# Patient Record
Sex: Female | Born: 1935 | Race: White | Hispanic: No | Smoking: Former smoker
Health system: Southern US, Community
[De-identification: ages and names within clinical notes are randomized; demographics above are authoritative.]

## PROBLEM LIST (undated history)

## (undated) DIAGNOSIS — M4800 Spinal stenosis, site unspecified: Secondary | ICD-10-CM

## (undated) DIAGNOSIS — E785 Hyperlipidemia, unspecified: Secondary | ICD-10-CM

## (undated) DIAGNOSIS — R300 Dysuria: Secondary | ICD-10-CM

## (undated) DIAGNOSIS — E039 Hypothyroidism, unspecified: Secondary | ICD-10-CM

## (undated) DIAGNOSIS — R0602 Shortness of breath: Secondary | ICD-10-CM

## (undated) DIAGNOSIS — F329 Major depressive disorder, single episode, unspecified: Secondary | ICD-10-CM

## (undated) DIAGNOSIS — Z5189 Encounter for other specified aftercare: Secondary | ICD-10-CM

## (undated) DIAGNOSIS — E538 Deficiency of other specified B group vitamins: Secondary | ICD-10-CM

## (undated) DIAGNOSIS — I219 Acute myocardial infarction, unspecified: Secondary | ICD-10-CM

## (undated) DIAGNOSIS — F411 Generalized anxiety disorder: Secondary | ICD-10-CM

## (undated) DIAGNOSIS — IMO0002 Reserved for concepts with insufficient information to code with codable children: Secondary | ICD-10-CM

## (undated) DIAGNOSIS — I2510 Atherosclerotic heart disease of native coronary artery without angina pectoris: Secondary | ICD-10-CM

## (undated) DIAGNOSIS — IMO0001 Reserved for inherently not codable concepts without codable children: Secondary | ICD-10-CM

## (undated) DIAGNOSIS — R5383 Other fatigue: Secondary | ICD-10-CM

## (undated) DIAGNOSIS — R269 Unspecified abnormalities of gait and mobility: Secondary | ICD-10-CM

## (undated) DIAGNOSIS — H269 Unspecified cataract: Secondary | ICD-10-CM

## (undated) DIAGNOSIS — R350 Frequency of micturition: Secondary | ICD-10-CM

## (undated) DIAGNOSIS — M1990 Unspecified osteoarthritis, unspecified site: Secondary | ICD-10-CM

## (undated) DIAGNOSIS — D649 Anemia, unspecified: Secondary | ICD-10-CM

## (undated) DIAGNOSIS — Z8719 Personal history of other diseases of the digestive system: Secondary | ICD-10-CM

## (undated) DIAGNOSIS — R5381 Other malaise: Secondary | ICD-10-CM

## (undated) DIAGNOSIS — E559 Vitamin D deficiency, unspecified: Secondary | ICD-10-CM

## (undated) DIAGNOSIS — I10 Essential (primary) hypertension: Secondary | ICD-10-CM

## (undated) DIAGNOSIS — N390 Urinary tract infection, site not specified: Secondary | ICD-10-CM

## (undated) DIAGNOSIS — I509 Heart failure, unspecified: Secondary | ICD-10-CM

## (undated) DIAGNOSIS — I639 Cerebral infarction, unspecified: Secondary | ICD-10-CM

## (undated) DIAGNOSIS — F32A Depression, unspecified: Secondary | ICD-10-CM

## (undated) DIAGNOSIS — K219 Gastro-esophageal reflux disease without esophagitis: Secondary | ICD-10-CM

## (undated) HISTORY — PX: EYE SURGERY: SHX253

## (undated) HISTORY — PX: BACK SURGERY: SHX140

## (undated) HISTORY — DX: Other malaise: R53.81

## (undated) HISTORY — DX: Reserved for concepts with insufficient information to code with codable children: IMO0002

## (undated) HISTORY — DX: Unspecified abnormalities of gait and mobility: R26.9

## (undated) HISTORY — PX: SPINE SURGERY: SHX786

## (undated) HISTORY — PX: CATARACT EXTRACTION W/ INTRAOCULAR LENS  IMPLANT, BILATERAL: SHX1307

## (undated) HISTORY — DX: Unspecified cataract: H26.9

## (undated) HISTORY — DX: Vitamin D deficiency, unspecified: E55.9

## (undated) HISTORY — DX: Other fatigue: R53.83

## (undated) HISTORY — PX: BLADDER SUSPENSION: SHX72

## (undated) HISTORY — DX: Dysuria: R30.0

## (undated) HISTORY — DX: Deficiency of other specified B group vitamins: E53.8

## (undated) HISTORY — PX: OTHER SURGICAL HISTORY: SHX169

## (undated) HISTORY — DX: Generalized anxiety disorder: F41.1

## (undated) HISTORY — DX: Encounter for other specified aftercare: Z51.89

## (undated) HISTORY — DX: Hyperlipidemia, unspecified: E78.5

## (undated) HISTORY — PX: APPENDECTOMY: SHX54

## (undated) HISTORY — PX: CARDIAC CATHETERIZATION: SHX172

---

## 1984-08-20 HISTORY — PX: ABDOMINAL HYSTERECTOMY: SHX81

## 1999-06-29 ENCOUNTER — Encounter: Payer: Self-pay | Admitting: Internal Medicine

## 1999-06-29 ENCOUNTER — Ambulatory Visit (HOSPITAL_COMMUNITY): Admission: RE | Admit: 1999-06-29 | Discharge: 1999-06-29 | Payer: Self-pay | Admitting: Internal Medicine

## 2003-06-22 ENCOUNTER — Ambulatory Visit (HOSPITAL_COMMUNITY): Admission: RE | Admit: 2003-06-22 | Discharge: 2003-06-23 | Payer: Self-pay | Admitting: Neurosurgery

## 2003-12-05 ENCOUNTER — Emergency Department (HOSPITAL_COMMUNITY): Admission: EM | Admit: 2003-12-05 | Discharge: 2003-12-05 | Payer: Self-pay | Admitting: Emergency Medicine

## 2004-04-27 ENCOUNTER — Emergency Department (HOSPITAL_COMMUNITY): Admission: EM | Admit: 2004-04-27 | Discharge: 2004-04-27 | Payer: Self-pay | Admitting: Emergency Medicine

## 2004-12-19 ENCOUNTER — Encounter: Admission: RE | Admit: 2004-12-19 | Discharge: 2004-12-19 | Payer: Self-pay | Admitting: Internal Medicine

## 2005-04-12 ENCOUNTER — Ambulatory Visit (HOSPITAL_COMMUNITY): Admission: RE | Admit: 2005-04-12 | Discharge: 2005-04-12 | Payer: Self-pay | Admitting: Interventional Cardiology

## 2005-05-03 ENCOUNTER — Ambulatory Visit (HOSPITAL_COMMUNITY): Admission: RE | Admit: 2005-05-03 | Discharge: 2005-05-04 | Payer: Self-pay | Admitting: Interventional Cardiology

## 2005-05-10 ENCOUNTER — Ambulatory Visit (HOSPITAL_COMMUNITY): Admission: RE | Admit: 2005-05-10 | Discharge: 2005-05-11 | Payer: Self-pay | Admitting: Interventional Cardiology

## 2005-05-24 ENCOUNTER — Encounter (HOSPITAL_COMMUNITY): Admission: RE | Admit: 2005-05-24 | Discharge: 2005-08-22 | Payer: Self-pay | Admitting: Interventional Cardiology

## 2005-08-21 ENCOUNTER — Encounter: Admission: RE | Admit: 2005-08-21 | Discharge: 2005-08-21 | Payer: Self-pay | Admitting: Internal Medicine

## 2005-08-23 ENCOUNTER — Encounter (HOSPITAL_COMMUNITY): Admission: RE | Admit: 2005-08-23 | Discharge: 2005-11-21 | Payer: Self-pay | Admitting: Interventional Cardiology

## 2006-01-29 ENCOUNTER — Encounter: Admission: RE | Admit: 2006-01-29 | Discharge: 2006-01-29 | Payer: Self-pay | Admitting: Obstetrics and Gynecology

## 2006-06-13 ENCOUNTER — Encounter: Admission: RE | Admit: 2006-06-13 | Discharge: 2006-06-13 | Payer: Self-pay | Admitting: Orthopedic Surgery

## 2006-07-04 ENCOUNTER — Encounter: Admission: RE | Admit: 2006-07-04 | Discharge: 2006-07-04 | Payer: Self-pay | Admitting: Orthopedic Surgery

## 2007-04-14 ENCOUNTER — Encounter: Admission: RE | Admit: 2007-04-14 | Discharge: 2007-04-14 | Payer: Self-pay | Admitting: *Deleted

## 2008-06-18 ENCOUNTER — Encounter: Admission: RE | Admit: 2008-06-18 | Discharge: 2008-06-18 | Payer: Self-pay | Admitting: *Deleted

## 2009-08-16 ENCOUNTER — Encounter: Admission: RE | Admit: 2009-08-16 | Discharge: 2009-08-16 | Payer: Self-pay | Admitting: Internal Medicine

## 2009-12-01 ENCOUNTER — Emergency Department (HOSPITAL_COMMUNITY): Admission: EM | Admit: 2009-12-01 | Discharge: 2009-12-01 | Payer: Self-pay | Admitting: Emergency Medicine

## 2010-09-09 ENCOUNTER — Encounter: Payer: Self-pay | Admitting: Orthopedic Surgery

## 2010-09-10 ENCOUNTER — Encounter: Payer: Self-pay | Admitting: Orthopedic Surgery

## 2010-09-14 ENCOUNTER — Other Ambulatory Visit: Payer: Self-pay | Admitting: Internal Medicine

## 2010-09-14 DIAGNOSIS — Z1239 Encounter for other screening for malignant neoplasm of breast: Secondary | ICD-10-CM

## 2010-09-14 DIAGNOSIS — Z1231 Encounter for screening mammogram for malignant neoplasm of breast: Secondary | ICD-10-CM

## 2010-09-27 ENCOUNTER — Ambulatory Visit
Admission: RE | Admit: 2010-09-27 | Discharge: 2010-09-27 | Disposition: A | Discharge status: Home / Self Care | Payer: Medicare Other | Source: Ambulatory Visit | Attending: Internal Medicine | Admitting: Internal Medicine

## 2010-09-27 DIAGNOSIS — Z1231 Encounter for screening mammogram for malignant neoplasm of breast: Secondary | ICD-10-CM

## 2010-11-08 LAB — URINE CULTURE: Colony Count: >=100000

## 2010-11-08 LAB — POCT CARDIAC MARKERS: Myoglobin, poc: 67.9 ng/mL (ref 12–200)

## 2010-11-08 LAB — DIFFERENTIAL
Basophils Absolute: 0 10*3/uL (ref 0.0–0.1)
Eosinophils Absolute: 0.2 10*3/uL (ref 0.0–0.7)
Eosinophils Relative: 2 % (ref 0–5)
Lymphocytes Relative: 17 % (ref 12–46)
Lymphs Abs: 1.2 10*3/uL (ref 0.7–4.0)
Neutrophils Relative %: 73 % (ref 43–77)

## 2010-11-08 LAB — CBC
MCV: 84.7 fL (ref 78.0–100.0)
Platelets: 228 10*3/uL (ref 150–400)
RDW: 14.1 % (ref 11.5–15.5)
WBC: 6.9 10*3/uL (ref 4.0–10.5)

## 2010-11-08 LAB — URINALYSIS, ROUTINE W REFLEX MICROSCOPIC
Bilirubin Urine: NEGATIVE
Ketones, ur: NEGATIVE mg/dL
Protein, ur: NEGATIVE mg/dL
Urobilinogen, UA: 0.2 mg/dL (ref 0.0–1.0)

## 2010-11-08 LAB — BASIC METABOLIC PANEL
BUN: 15 mg/dL (ref 6–23)
Creatinine, Ser: 0.89 mg/dL (ref 0.4–1.2)
GFR calc non Af Amer: >60 mL/min (ref >60)
Glucose, Bld: 142 mg/dL — ABNORMAL HIGH (ref 70–99)

## 2011-01-05 NOTE — Cardiovascular Report (Signed)
Abigail Brooks, DELAND NO.:  [id number]   MEDICAL RECORD NO.:  [id number]          PATIENT TYPE:  OIB   LOCATION:  6525                         FACILITY:  [facility]   PHYSICIAN:  Lyn Records, M.D.   DATE OF BIRTH:  Apr 01, 1935   DATE OF PROCEDURE:  05/03/2005  DATE OF DISCHARGE:                              CARDIAC CATHETERIZATION   INDICATIONS:  The patient is 19 and has a history of hypertension, and had a  recent CT angiogram that suggested the possibility of high-grade disease in  the right coronary and in the circumflex territory. A previous Cardiolite  study showed fixed anterior and inferior wall defects, without evidence of  ischemia. This study is being done to define coronary anatomy.   PROCEDURES PERFORMED:  1.  Left heart catheterization  2.  Selective coronary angiogram.  3.  Left ventriculography: Not performed but LV pressure was measured.   OPERATORS:  1.  Corky Crafts, MD  2.  Elwanda Brooklyn. Katrinka Blazing III M.D.   DESCRIPTION:  After informed consent, a 6-French sheath was placed in the  right femoral artery using the modified Seldinger technique. A 6-French left  Judkins and 6-French right Judkins catheter was then used for left coronary  and right coronary angiography. The right coronary catheter was also used to  measure hemodynamics in the LV and to record a pullback pressure across the  aortic valve. Left ventriculography was not performed, because the  previously evaluated LV function during the Cardiolite study and the EF was  documented to be 64%.   After reviewing the digital cineangiograms, we felt that proceeding with  percutaneous intervention was appropriate. Please see separate report for  that procedure.   DESCRIPTION:   RESULTS:   HEMODYNAMIC DATA:  1.  Left ventricular pressure:  157/89.  2.  Aortic pressure:  152/70.   LEFT VENTRICULOGRAPHY:  Left ventriculography was not performed. EF known to  be greater than 60% by  Cardiolite wall motion study in August.   CORONARY ANGIOGRAPHY:  1.  LEFT MAIN CORONARY: Widely patent.  2.  LEFT ANTERIOR DESCENDING CORONARY ARTERY: The LAD is large and      transapical. It gives origin to three diagonal branches. Proximal, mid      and distal irregularities are noted. Up to 30%- 40% stenosis is noted in      the mid vessel. The diagonals were clear of any significant obstructive      lesions. The distal LAD contained up to 30-50% narrowing.  3.  CIRCUMFLEX ARTERY: The circumflex coronary artery is nondominant. There      is segmental involvement in the mid vessel. The circumflex gives origin      with three obtuse marginal branches and the left atrial recurrent. The      proximal/first obtuse marginal is relatively small. In this region of      the proximal and early mid circumflex there is 50-70% narrowing, and in      the mid to distal circumflex (beyond the origin of the first obtuse      marginal) there is high-grade  obstruction with 70-80% narrowing. This      was also relatively segmental. The second and third obtuse marginals are      large and not involved by this mid vessel stenosis.  4.  RIGHT CORONARY: The right coronary artery is a dominant vessel, giving      the AV-nodal artery and the PDA. This vessel is severely diseased. There      was proximal, mid and distal disease. The mid segment,  however, is      highly diseased, with a segmental region of stenosis beyond the acute      marginal branch  -- with stenosis up to 90-95%. The PDA is relatively      large; also contains 50-70% mid vessel; disease. The proximal right      coronary initially had spasm, that was relieved with intracoronary      nitroglycerin, and there is residual 30-50% stenoses within the proximal      segment of the right coronary.   CONCLUSIONS:  1.  Severe right coronary disease (as described above), with segmental      greater than 90% stenosis in the mid to distal vessel. There  is also      significant disease of mid and distal circumflex. The LAD is large and      widely patent.  2.  Previously demonstrated normal LV function, with EF greater than 64% by      Cardiolite scintigraphy wall motion study.   RECOMMENDATIONS:  We previously discussed with the patient during her office  visit of high-grade disease.  We found that PCI would be performed. We did  feel it was appropriate to proceed with PCI on the right coronary, and  decided to do the circumflex at a later date; therefore staging this  procedure. Please see the separate report for the patient's PCI.      Lyn Records, M.D.  Electronically Signed     HWS/MEDQ  D:  05/03/2005  T:  05/03/2005  Job:  [id number]   cc:   Earmon Phoenix, M.D.

## 2011-01-05 NOTE — Cardiovascular Report (Signed)
NAMESHEYLI, HORWITZ NO.:  [id number]   MEDICAL RECORD NO.:  [id number]          PATIENT TYPE:  OIB   LOCATION:  6522                         FACILITY:  [facility]   PHYSICIAN:  Lyn Records, M.D.   DATE OF BIRTH:  November 07, 1934   DATE OF PROCEDURE:  05/10/2005  DATE OF DISCHARGE:                              CARDIAC CATHETERIZATION   INDICATIONS:  Documented significant coronary artery disease involving the  mid right coronary. Prior Cardiolite study demonstrating high-grade disease.   OPERATORS:  Dr. Catalina Gravel and Dr. Verdis Prime.   PROCEDURE PERFORMED:  Drug-eluting stent implantation in the mid right  coronary using a Cypher drug-eluting stent.   DESCRIPTION:  After informed consent with Dr. Eldridge Dace serving as the  primary operator, a 6-French arterial sheath was placed in the left femoral  artery using modified Seldinger technique. The patient was then given a  loading dose followed by an infusion of Angiomax. The patient was pretreated  with Plavix and Integrilin several days since the initial PCI of the right  coronary.   NXB 6-French 3.5 centimeter catheter was used to obtain guiding shots and  then an [employer] wire was used to cross into the distal circumflex.  Predilatation was performed with a 12 mm long x 2.5 mm diameter Voyager. One  balloon inflation was performed. A Cypher 13 x 2.5 mm stent was then placed  and peak deployment pressure was 12 atmospheres. Post dilatation was then  performed with a 3.0 x 8 PowerSail balloon to 10 atmospheres. The post  dilatation angiographic result was excellent with reduction in the 85%  stenosis to less than 5% with brisk TIMI grade III flow. AngioSeal  arteriotomy closure was performed after sheath injection demonstrated  adequate position. Good hemostasis was achieved.   CONCLUSION:  Successful percutaneous coronary intervention of the  midcircumflex with reduction in stenosis from 85% to less than 5%  with TIMI  grade III flow using a Cypher drug-eluting stent 2.5 x 13 and postdilated to  3.0 mm.   PLAN:  Aspirin, Plavix. Integrilin has been discontinued.      Lyn Records, M.D.  Electronically Signed     HWS/MEDQ  D:  05/10/2005  T:  05/10/2005  Job:  [id number]   cc:   Corky Crafts, MD  Fax: [phone]   Karlene Einstein, M.D.  Fax: [phone]

## 2011-01-05 NOTE — Op Note (Signed)
NAME:  Abigail Brooks, Abigail Brooks                        ACCOUNT NO.:  [id number]   MEDICAL RECORD NO.:  [id number]                   PATIENT TYPE:  OIB   LOCATION:  2899                                 FACILITY:  [facility]   PHYSICIAN:  Kathaleen Maser. Pool, M.D.                 DATE OF BIRTH:  [birth year]   DATE OF PROCEDURE:  06/22/2003  DATE OF DISCHARGE:                                 OPERATIVE REPORT   PREOPERATIVE DIAGNOSES:  Right L5-S1 herniated nucleus pulposus with  radiculopathy.   POSTOPERATIVE DIAGNOSES:  Right L5-S1 herniated nucleus pulposus with  radiculopathy.   PROCEDURE:  Right L5-S1 laminotomy with microdiskectomy.   SURGEON:  Kathaleen Maser. Pool, M.D.   ASSISTANT:  Reinaldo Meeker, M.D.   ANESTHESIA:  General endotracheal.   INDICATIONS FOR PROCEDURE:  The patient is a 76 year old female with history  of back and right lower extremity pain, paresthesia and weakness of the  right-sided S1 radiculopathy which failed conservative management.  Workup  has demonstrated evidence of degenerative disk disease with a rightward L5-  S1 disk herniation, causing compression of the right-sided S1 nerve root.  The patient has failed conservative management and now presents for  laminotomy and microdiskectomy with hopeful improvement in his symptoms.   DESCRIPTION OF PROCEDURE:  The patient was taken to the operating room and  placed on the table in a supine position.  After an adequate level of  anesthesia was achieved, the patient was placed prone onto a Wilson frame,  appropriate padded.  The patient's lumbar region was prepped and draped  sterilely.  We made a skin incision overlying the L5-S1 interspace.  This  was carried down sharply in the midline.  Subperiosteal dissection was  performed in the lamina and facet joints of L5 and S1 on the right.  Intraoperative x-rays were taken and the level was confirmed.  Laminotomy  was performed using high speed drill and Kerrison rongeurs to  remove the  inferior aspect of lamina of L5 and medial aspect of the L5-S1 facet joint,  and the superior remnant of S1. Ligamentum flavum was then elevated and  resected in a piecemeal fashion using Kerrison rongeurs.  The thecal sac was  identified.  The microscope was brought into the field to do microdissection  of the left-sided S1 nerve root.  Underlying disk herniation was coagulated  and cut.  Thecal sac and S1 nerve root were mobilized, distracted toward the  middle.  Disk herniation was readily apparent.  This was then incised with  15 blade in rectangular fashion.  A wide disk space cleanout was then  achieved using pituitary rongeurs, open angled pituitary rongeurs, and  Epstein curets.  All elements of disk herniation were apparently resected.  All loose, obviously degenerative disk material was removed from the  interspace.  At this point a very thorough diskectomy had been achieved.  There was no evidence of  any residual compression of the thecal sac or nerve  roots.  The wound was then irrigated with antibiotic solution.  Gelfoam was  placed topically for hemostasis which was found to be good.  Microscope and  distractors  were removed.  Hemostasis was achieved with electrocautery.  Wound was then  closed in layers with Vicryl sutures.  Steri-Strips and sterile dressing  were applied.  There were no apparent complications.  The patient tolerated  the procedure well and she returns to recovery room postoperatively.                                               Henry A. Pool, M.D.    HAP/MEDQ  D:  06/22/2003  T:  06/22/2003  Job:  [id number]

## 2011-01-05 NOTE — Discharge Summary (Signed)
Abigail Brooks, Abigail Brooks              ACCOUNT NO.:  [id number]   MEDICAL RECORD NO.:  [id number]          PATIENT TYPE:  OIB   LOCATION:  6525                         FACILITY:  [facility]   PHYSICIAN:  Lyn Records, M.D.   DATE OF BIRTH:  1935-04-07   DATE OF ADMISSION:  05/03/2005  DATE OF DISCHARGE:  05/04/2005                                 DISCHARGE SUMMARY   ADMISSION DIAGNOSIS:  Suspected coronary artery disease.   DISCHARGE DIAGNOSES:  1.  Severe two-vessel coronary artery disease with greater than 90% stenosis      in the right coronary and 80% circumflex disease.  2.  Successful implantation of drug-eluting stent in the right coronary on      __________ 14, 2006.  3.  Hypertension.  4.  Hyperlipidemia.   PROCEDURE PERFORMED:  Diagnostic cardiac catheterization and percutaneous  coronary intervention with right coronary stenting.   DISCHARGE INSTRUCTIONS:  The patient is discharged on:  1.  Aspirin 325 mg daily.  2.  Plavix 75 mg daily.  3.  Lisinopril 20 mg daily.  4.  Hydrochlorothiazide 25 mg per day.  5.  Nabumetone 500 mg twice a day.  6.  Ditropan 10 mg per day.  7.  Metoprolol 50 mg twice a day.  8.  Sublingual nitroglycerin p.r.n.   She will be readmitted on May 10, 2005, for an elective PCI on the  circumflex.  This procedure is scheduled for 9:30 a.m.   ACTIVITY:  The patient is free to ambulate as tolerated.   SPECIAL INSTRUCTIONS:  She should use sublingual nitroglycerin if any  tightness or chest discomfort.   CONDITION ON DISCHARGE:  Improved.   HOSPITAL COURSE:  This patient had a CT angiogram performed earlier this  month that has suggested high-grade right coronary and circumflex disease.  Diagnostic catheterization today demonstrated high-grade distal right  coronary and mid-right coronary disease as well as high-grade mid- and  distal circumflex disease.  PCI was performed on the right coronary lesion  in the mid- to distal segment with  reduction of a greater than 90% stenosis  to  less than or equal to 0% with TIMI grade 3 flow.  She will return on  May 10, 2005, for staged PCI of the circumflex disease.   New medications added include Plavix 75 mg daily.  The patient is discharged  in improved condition.      Lyn Records, M.D.  Electronically Signed     HWS/MEDQ  D:  05/03/2005  T:  05/04/2005  Job:  [id number]

## 2011-01-05 NOTE — Discharge Summary (Signed)
[name], Brooks              ACCOUNT NO.:  [id number]   MEDICAL RECORD NO.:  [id number]          PATIENT TYPE:  OIB   LOCATION:  6522                         FACILITY:  [facility]   PHYSICIAN:  Lyn Records, M.D.   DATE OF BIRTH:  [birth year]   DATE OF ADMISSION:  05/10/2005  DATE OF DISCHARGE:  05/11/2005                                 DISCHARGE SUMMARY   ADMISSION DIAGNOSIS:  High-grade circumflex mid vessel disease.   DISCHARGE DIAGNOSIS:  1.  Status post drug-eluting stent to the mid circumflex with excellent      angiographic result and stenosis reduced to less than 10%.  2.  Hypotension.  3.  Fatigue secondary to beta blocker therapy.  4.  Hyperlipidemia, on therapy.   PROCEDURES PERFORMED:  Drug-eluting stent implantation to the mid circumflex  coronary artery with excellent result on May 10, 2005.   DISCHARGE PLAN:  The patient is discharged home on May 11, 2205.   MEDICATIONS AT DISCHARGE:  1.  Plavix 75 mg daily.  2.  Enteric-coated aspirin 325 mg daily.  3.  Metoprolol 50 mg tablets. The dose was decreased one-half tablet b.i.d.      (25 mg b.i.d.).  4.  Lisinopril 40 mg daily.  5.  Ditropan XL 10 mg daily.  6.  HCTZ 25 mg one-half tablet daily.  7.  Mobic 15 mg daily as needed.  8.  Diazepam 10 mg daily as needed.  9.  Nitroglycerin 0.4 mg sublingual p.r.n.   ACTIVITY:  Increased as tolerated. No driving for five to seven days.  Anticipate full level of activity by one week following discharge.   DISCHARGE INSTRUCTIONS:  1.  Office visit with Dr. Katrinka Blazing on June 18, 2005, at 2:45 p.m.  2.  Instructed to call if any prolonged episodes of chest discomfort or the      need for sublingual nitroglycerin.   CONDITION ON DISCHARGE:  Improved.   HISTORY AND PHYSICAL/HOSPITAL COURSE:  Please refer to the records already  on the chart for the patient's history. Briefly, she had a progressive story  of chest discomfort. A cardiac CT demonstrated  high-grade disease in the  circumflex and right coronary. She underwent right coronary stent  implantation at the same time as diagnostic cardiac catheterization on  May 03, 2005, and returned on May 10, 2005, for elective left  circumflex stent implantation. Both were drug-eluting stents, CYPHER.  No  complications occurred and the patient is discharged to the hospital. No  complications occurred and the patient is discharged from the hospital in  improved condition.      Lyn Records, M.D.  Electronically Signed     HWS/MEDQ  D:  05/11/2005  T:  05/11/2005  Job:  [id number]

## 2011-01-05 NOTE — Op Note (Signed)
[name], Abigail Brooks NO.:  [id number]   MEDICAL RECORD NO.:  [id number]          PATIENT TYPE:  OIB   LOCATION:  6525                         FACILITY:  [facility]   PHYSICIAN:  Lyn Records, M.D.   DATE OF BIRTH:  26-May-1935   DATE OF PROCEDURE:  05/03/2005  DATE OF DISCHARGE:                                 OPERATIVE REPORT   PROCEDURE:  Percutaneous coronary intervention.   INDICATION:  Angina and documented high-grade disease in the mid-to-distal  segment of the right coronary.   OPERATORS:  Corky Crafts, M.D. and Lyn Records, M.D.   PROCEDURE PERFORMED:  Drug-eluting stent mid/distal right coronary.   DESCRIPTION:  Following the diagnostic procedure done on the same day, we  proceeded to perform intervention on the mid-right coronary. The primary  operator was Corky Crafts, M.D. The patient was given weight-based  heparin in a double bolus followed by an infusion of Integrilin.  A JR-4  side-hole guide catheter was used to obtain guiding shots. After the ACT was  documented to be greater than 200 seconds and ultimately noted to be above  300, PCI was performed using a [employer] wire. This wire was distally placed in  the right coronary, and predilatation was performed with a 20-mm long x 2.5  mm Voyager balloon. A Cypher 2.5-mm x 23-mm long stent was then deployed.  Multiple balloon inflations were performed.  Pulse dilatation was then  performed using a 3.0 x 15 PowerSail. Peak pressure with this balloon was 20  atmospheres. A very nice angiographic result was noted post procedure. TIMI  grade 3 flow was maintained. The patient did have angina with balloon  inflations, and there were ST-segment changes. Postprocedure, the patient  was pain free and had no ischemic EKG changes noted. A closure device was  not used.   CONCLUSION:  Successful drug-eluting stent deployment in the mid-to-distal  right coronary with reduction in stenosis from greater  than 90% to 0% with  TIMI grade 3 flow using a Cypher drug-eluting stent as described above.   PLAN:  Integrilin x18 hours, discharge May 04, 2005.  The patient will  receive Plavix for at least six months and possibly a year, and we plan on  performing PCI on the patient's circumflex in the next 1-2 weeks.      Lyn Records, M.D.  Electronically Signed     HWS/MEDQ  D:  05/03/2005  T:  05/03/2005  Job:  [id number]   cc:   Karlene Einstein, M.D.  [address]  [place]  [place] [address]  Fax: [phone]

## 2011-02-26 ENCOUNTER — Emergency Department (HOSPITAL_COMMUNITY): Payer: Medicare Other

## 2011-02-26 ENCOUNTER — Inpatient Hospital Stay (HOSPITAL_COMMUNITY)
Admission: EM | Admit: 2011-02-26 | Discharge: 2011-03-01 | DRG: 293 | Disposition: A | Discharge status: Home / Self Care | Payer: Medicare Other | Attending: Interventional Cardiology | Admitting: Interventional Cardiology

## 2011-02-26 DIAGNOSIS — F411 Generalized anxiety disorder: Secondary | ICD-10-CM | POA: Diagnosis present

## 2011-02-26 DIAGNOSIS — I5033 Acute on chronic diastolic (congestive) heart failure: Principal | ICD-10-CM | POA: Diagnosis present

## 2011-02-26 DIAGNOSIS — D649 Anemia, unspecified: Secondary | ICD-10-CM | POA: Diagnosis present

## 2011-02-26 DIAGNOSIS — I509 Heart failure, unspecified: Secondary | ICD-10-CM | POA: Diagnosis present

## 2011-02-26 DIAGNOSIS — B029 Zoster without complications: Secondary | ICD-10-CM | POA: Diagnosis present

## 2011-02-26 DIAGNOSIS — I2510 Atherosclerotic heart disease of native coronary artery without angina pectoris: Secondary | ICD-10-CM | POA: Diagnosis present

## 2011-02-26 DIAGNOSIS — Z7982 Long term (current) use of aspirin: Secondary | ICD-10-CM

## 2011-02-26 DIAGNOSIS — I10 Essential (primary) hypertension: Secondary | ICD-10-CM | POA: Diagnosis present

## 2011-02-26 LAB — COMPREHENSIVE METABOLIC PANEL
Albumin: 3.1 g/dL — ABNORMAL LOW (ref 3.5–5.2)
Alkaline Phosphatase: 58 U/L (ref 39–117)
BUN: 10 mg/dL (ref 6–23)
Calcium: 9 mg/dL (ref 8.4–10.5)
Potassium: 3.5 mEq/L (ref 3.5–5.1)
Sodium: 141 mEq/L (ref 135–145)
Total Protein: 6.5 g/dL (ref 6.0–8.3)

## 2011-02-26 LAB — PROTIME-INR: Prothrombin Time: 12.8 seconds (ref 11.6–15.2)

## 2011-02-26 LAB — CARDIAC PANEL(CRET KIN+CKTOT+MB+TROPI)
CK, MB: 1.4 ng/mL (ref 0.3–4.0)
Total CK: 32 U/L (ref 7–177)
Troponin I: <0.3 ng/mL (ref <0.30)

## 2011-02-26 LAB — DIFFERENTIAL
Lymphocytes Relative: 19 % (ref 12–46)
Lymphs Abs: 1.1 10*3/uL (ref 0.7–4.0)
Neutrophils Relative %: 69 % (ref 43–77)

## 2011-02-26 LAB — POCT I-STAT, CHEM 8
BUN: 12 mg/dL (ref 6–23)
Calcium, Ion: 1.12 mmol/L (ref 1.12–1.32)
Chloride: 102 mEq/L (ref 96–112)
Glucose, Bld: 147 mg/dL — ABNORMAL HIGH (ref 70–99)

## 2011-02-26 LAB — CBC
HCT: 31.5 % — ABNORMAL LOW (ref 36.0–46.0)
Hemoglobin: 10.1 g/dL — ABNORMAL LOW (ref 12.0–15.0)
MCV: 79.9 fL (ref 78.0–100.0)
RBC: 3.94 MIL/uL (ref 3.87–5.11)
WBC: 5.8 10*3/uL (ref 4.0–10.5)

## 2011-02-26 LAB — TROPONIN I: Troponin I: <0.3 ng/mL (ref <0.30)

## 2011-02-26 LAB — PRO B NATRIURETIC PEPTIDE: Pro B Natriuretic peptide (BNP): 1186 pg/mL — ABNORMAL HIGH (ref 0–125)

## 2011-02-26 LAB — HEMOGLOBIN A1C: Mean Plasma Glucose: 157 mg/dL — ABNORMAL HIGH (ref <117)

## 2011-02-26 LAB — GLUCOSE, CAPILLARY: Glucose-Capillary: 170 mg/dL — ABNORMAL HIGH (ref 70–99)

## 2011-02-27 ENCOUNTER — Inpatient Hospital Stay (HOSPITAL_COMMUNITY): Payer: Medicare Other

## 2011-02-27 LAB — D-DIMER, QUANTITATIVE: D-Dimer, Quant: 0.98 ug/mL-FEU — ABNORMAL HIGH (ref 0.00–0.48)

## 2011-02-27 LAB — BASIC METABOLIC PANEL
BUN: 14 mg/dL (ref 6–23)
Calcium: 8.8 mg/dL (ref 8.4–10.5)
Creatinine, Ser: 0.88 mg/dL (ref 0.50–1.10)
GFR calc Af Amer: >60 mL/min (ref >60)
GFR calc non Af Amer: >60 mL/min (ref >60)
Glucose, Bld: 159 mg/dL — ABNORMAL HIGH (ref 70–99)
Potassium: 3.8 mEq/L (ref 3.5–5.1)

## 2011-02-27 LAB — GLUCOSE, CAPILLARY
Glucose-Capillary: 135 mg/dL — ABNORMAL HIGH (ref 70–99)
Glucose-Capillary: 155 mg/dL — ABNORMAL HIGH (ref 70–99)

## 2011-02-27 LAB — CARDIAC PANEL(CRET KIN+CKTOT+MB+TROPI)
CK, MB: 1.5 ng/mL (ref 0.3–4.0)
Total CK: 27 U/L (ref 7–177)
Troponin I: <0.3 ng/mL (ref <0.30)

## 2011-02-28 ENCOUNTER — Encounter (HOSPITAL_COMMUNITY): Payer: Medicare Other | Attending: Cardiology

## 2011-02-28 ENCOUNTER — Inpatient Hospital Stay (HOSPITAL_COMMUNITY): Payer: Medicare Other

## 2011-02-28 LAB — GLUCOSE, CAPILLARY: Glucose-Capillary: 126 mg/dL — ABNORMAL HIGH (ref 70–99)

## 2011-02-28 LAB — BASIC METABOLIC PANEL
BUN: 18 mg/dL (ref 6–23)
Creatinine, Ser: 0.78 mg/dL (ref 0.50–1.10)
GFR calc Af Amer: >60 mL/min (ref >60)
GFR calc non Af Amer: >60 mL/min (ref >60)
Glucose, Bld: 153 mg/dL — ABNORMAL HIGH (ref 70–99)

## 2011-02-28 MED ORDER — IOHEXOL 300 MG/ML  SOLN
100.0000 mL | Freq: Once | INTRAMUSCULAR | Status: AC | PRN
  Administered 2011-02-28: 100 mL via INTRAVENOUS

## 2011-02-28 MED ORDER — TECHNETIUM TC 99M TETROFOSMIN IV KIT
30.0000 | PACK | Freq: Once | INTRAVENOUS | Status: AC | PRN
  Administered 2011-02-28: 30 via INTRAVENOUS

## 2011-02-28 MED ORDER — TECHNETIUM TC 99M TETROFOSMIN IV KIT
10.0000 | PACK | Freq: Once | INTRAVENOUS | Status: AC | PRN
  Administered 2011-02-27: 30 via INTRAVENOUS

## 2011-03-01 LAB — CBC
HCT: 33.2 % — ABNORMAL LOW (ref 36.0–46.0)
MCV: 80.4 fL (ref 78.0–100.0)
Platelets: 207 10*3/uL (ref 150–400)
RBC: 4.13 MIL/uL (ref 3.87–5.11)
RDW: 14.9 % (ref 11.5–15.5)
WBC: 7.5 10*3/uL (ref 4.0–10.5)

## 2011-03-15 NOTE — Discharge Summary (Signed)
NAMEDEBORAHANN, Abigail Brooks NO.:  [id number]  MEDICAL RECORD NO.:  [id number]  LOCATION:  3302                         FACILITY:  [facility]  PHYSICIAN:  Lyn Records, M.D.   DATE OF BIRTH:  [birth year]  DATE OF ADMISSION:  02/26/2011 DATE OF DISCHARGE:  03/01/2011                              DISCHARGE SUMMARY   REASON FOR ADMISSION:  Dyspnea.  DISCHARGE DIAGNOSES: 1. Acute on chronic diastolic heart failure, resolved with diuresis.     a.     Left ventricular hypertrophy by echocardiogram with ejection      fraction of 65%.     b.     Elevated BNP of 1100, decreased to 300 at discharge. 2. Coronary atherosclerotic heart disease.     a.     Prior right coronary and circumflex stents in 2004.     b.     Negative Cardiolite for ischemia on February 28, 2011. 3. Shingles, 66 weeks old and no prior therapy before this admission. 4. Hypertension. 5. Anxiety. 6. Anemia.  PROCEDURES PERFORMED DURING HOSPITAL STAY: 1. Two-day pharmacologic nuclear perfusion study, February 28, 2011,     negative for ischemia. 2. A 2-D Doppler echocardiogram, February 28, 2011, left ventricular     hypertrophy, ejection fraction 65%. 3. CT angio of the lungs due to elevated D-dimer on February 28, 2011,     negative for pulmonary embolism.  DISCHARGE PLANS: 1. Low-salt diet. 2. The patient is to see her primary care physician, Dr. Allena Katz at     [facility] as soon as possible with reference to     shingles, which I now believed has run its course and is between 74     and 41 weeks old. 3. Medication regimen:  Losartan and hydrochlorothiazide 50/12.5 mg     daily. 4. Artificial tears in both eyes daily. 5. Coated aspirin 325 mg daily. 6. Citalopram 20 mg daily. 7. Clonazepam 0.5 mg twice daily. 8. Dulcolax stool softener 1-2 tablets twice daily. 9. Fish oil 1000 mg three times a day. 10.Metformin 500 mg twice daily. 11.Metoprolol tartrate 100 mg twice daily. 12.Oxybutynin 5 mg as needed  every 6 hours for pain. 13.Simvastatin 40 mg daily. 14.Vitamin D3 U and K two tablets each day. 15.Discontinue ibuprofen.  CLINICAL FOLLOWUP:  With Dr. Barbra Sarks on March 07, 2011, at 10:15 a.m. also a BMET will be done at that time to assess potassium and renal function.  The patient is to call if any recurrence of dyspnea.  HISTORY AND PHYSICAL AND HOSPITAL COURSE:  Please review the patient's admitting history and physical.  The patient was admitted to the hospital because of dyspnea and that was a concern on her part that this represented recurrence of coronary obstruction.  Cardiac markers were negative.  BNP was elevated. Myocardial perfusion study demonstrated no evidence of ischemia.  CT angio of the lungs did not demonstrate evidence of pulmonary emboli.  IV diuresis of greater than 2 L led to significant improvement in dyspnea. She was ultimately discharged from the hospital on a high dose of losartan and hydrochlorothiazide was added.  She will follow up as an outpatient in 1  week for blood work and clinical evaluation with reference to perhaps further titration of diuretic therapy if needed.  Coincidentally after admission, we found that the patient had left lateral chest shingles with almost every lesion appearing to be old and scabbed over, although because of this, we did place her on aerosol isolation throughout the entire hospital stay.  She is urged to follow up with her primary physician within 24 hours for further management of this condition.  At discharge, the patient's creatinine is 0.78, potassium 3.7, hemoglobin 10.5.  The patient's discharge weight is 225.2 pounds with her clothes on.  Dictation ended at this point.     Lyn Records, M.D.     HWS/MEDQ  D:  03/01/2011  T:  03/01/2011  Job:  [id number]  cc:   Dr. Allena Katz  Electronically Signed by Verdis Prime M.D. on 03/15/2011 11:27:43 AM

## 2011-03-15 NOTE — H&P (Signed)
  Abigail Brooks, GOWANS NO.:  [id number]  MEDICAL RECORD NO.:  [id number]  LOCATION:  3302                         FACILITY:  [facility]  PHYSICIAN:  Lyn Records, M.D.   DATE OF BIRTH:  Dec 23, 1934  DATE OF ADMISSION:  02/26/2011 DATE OF DISCHARGE:                             HISTORY & PHYSICAL   REASON FOR ADMISSION:  Dyspnea, orthopnea, and chest fullness.  SUBJECTIVE:  The patient is 76 years of age and has experienced increasing dyspnea on exertion and chest fullness over the past several months.  She was seen in our office on October 03, 2010 for dyspnea. Cardiac evaluation including a BNP level were normal at that time.  She has gotten to the point now where it is difficult for her to lay down. Any activity causes dyspnea.  She has not had wheezing and denies chills and fever.  She has noticed a rash in her left upper chest for 2-3 weeks.  It is painful.  PAST MEDICAL HISTORY: 1. Coronary atherosclerotic heart disease with drug-eluting stent     implantation in the circumflex and right coronary in 2006. 2. Hyperlipidemia. 3. Hypertension. 4. Probable shingles for 3 weeks. 5. Anxiety disorder.  SURGERIES:  None.  HABITS:  Does not smoke or drink.  ALLERGIES:  CODEINE.  REVIEW OF SYSTEMS:  Anxiety disorder.  OBJECTIVE:  VITAL SIGNS:  Blood pressure 160/60, heart rate 70. NECK:  Difficult to evaluate.  No obvious JVD. SKIN:  Reveals left axillary and scapular area of vesicular eruption with scabbing. HEENT:  Unremarkable.  No eye lesions are noted. CHEST:  Reveals bibasilar crackles. CARDIAC:  Reveals no S4, no murmur. ABDOMEN:  Obese.  Nontender. EXTREMITIES:  Reveal left lower extremity edema of 1 to 2+. NEUROLOGIC:  Reveals decreased memory.  EKG reveals normal sinus rhythm with left axis deviation.  Laboratory data revealed hemoglobin of 10.5, sed rate of 42.  White blood cell count of 5800, creatinine of 0.65, potassium 3.5, BNP 1186, A1c  7.1. Chest x-ray revealed cardiomegaly without CHF.  ASSESSMENT: 1. Dyspnea and chest tightness.  Suspect diastolic heart failure.     Rule out coronary artery disease. 2. Probable shingles, left chest greater than 74 weeks old and less     than 78 weeks old with most lesions appearing scabbed over. 3. Hypertension with poorly controlled systolic pressure.  PLAN: 1. Diuresis. 2. Nuclear study to rule out ischemia. 3. Check D-dimer. 4. Isolation due to shingles. 5. Better blood pressure control.     Lyn Records, M.D.     HWS/MEDQ  D:  02/28/2011  T:  02/28/2011  Job:  [id number]  Electronically Signed by Verdis Prime M.D. on 03/15/2011 11:27:48 AM

## 2011-09-11 ENCOUNTER — Other Ambulatory Visit: Payer: Self-pay | Admitting: Internal Medicine

## 2011-09-11 DIAGNOSIS — Z79899 Other long term (current) drug therapy: Secondary | ICD-10-CM | POA: Diagnosis not present

## 2011-09-11 DIAGNOSIS — Z1231 Encounter for screening mammogram for malignant neoplasm of breast: Secondary | ICD-10-CM

## 2011-09-11 DIAGNOSIS — E785 Hyperlipidemia, unspecified: Secondary | ICD-10-CM | POA: Diagnosis not present

## 2011-09-11 DIAGNOSIS — M545 Low back pain: Secondary | ICD-10-CM

## 2011-09-11 DIAGNOSIS — E119 Type 2 diabetes mellitus without complications: Secondary | ICD-10-CM | POA: Diagnosis not present

## 2011-09-11 DIAGNOSIS — I10 Essential (primary) hypertension: Secondary | ICD-10-CM | POA: Diagnosis not present

## 2011-09-11 DIAGNOSIS — Z780 Asymptomatic menopausal state: Secondary | ICD-10-CM

## 2011-09-15 ENCOUNTER — Ambulatory Visit
Admission: RE | Admit: 2011-09-15 | Discharge: 2011-09-15 | Disposition: A | Discharge status: Home / Self Care | Payer: Medicare Other | Source: Ambulatory Visit | Attending: Internal Medicine | Admitting: Internal Medicine

## 2011-09-15 DIAGNOSIS — M7130 Other bursal cyst, unspecified site: Secondary | ICD-10-CM | POA: Diagnosis not present

## 2011-09-15 DIAGNOSIS — M545 Low back pain: Secondary | ICD-10-CM

## 2011-09-15 DIAGNOSIS — M47817 Spondylosis without myelopathy or radiculopathy, lumbosacral region: Secondary | ICD-10-CM | POA: Diagnosis not present

## 2011-09-15 DIAGNOSIS — M5137 Other intervertebral disc degeneration, lumbosacral region: Secondary | ICD-10-CM | POA: Diagnosis not present

## 2011-09-26 DIAGNOSIS — M48061 Spinal stenosis, lumbar region without neurogenic claudication: Secondary | ICD-10-CM | POA: Diagnosis not present

## 2011-10-01 DIAGNOSIS — M48061 Spinal stenosis, lumbar region without neurogenic claudication: Secondary | ICD-10-CM | POA: Diagnosis not present

## 2011-10-09 DIAGNOSIS — M48061 Spinal stenosis, lumbar region without neurogenic claudication: Secondary | ICD-10-CM | POA: Diagnosis not present

## 2011-10-09 DIAGNOSIS — IMO0002 Reserved for concepts with insufficient information to code with codable children: Secondary | ICD-10-CM | POA: Diagnosis not present

## 2011-10-09 DIAGNOSIS — M47817 Spondylosis without myelopathy or radiculopathy, lumbosacral region: Secondary | ICD-10-CM | POA: Diagnosis not present

## 2011-10-10 ENCOUNTER — Other Ambulatory Visit: Payer: Medicare Other

## 2011-10-10 ENCOUNTER — Ambulatory Visit: Payer: Medicare Other

## 2011-10-25 DIAGNOSIS — IMO0002 Reserved for concepts with insufficient information to code with codable children: Secondary | ICD-10-CM | POA: Diagnosis not present

## 2011-10-25 DIAGNOSIS — M47817 Spondylosis without myelopathy or radiculopathy, lumbosacral region: Secondary | ICD-10-CM | POA: Diagnosis not present

## 2011-10-25 DIAGNOSIS — M48061 Spinal stenosis, lumbar region without neurogenic claudication: Secondary | ICD-10-CM | POA: Diagnosis not present

## 2011-11-15 DIAGNOSIS — IMO0002 Reserved for concepts with insufficient information to code with codable children: Secondary | ICD-10-CM | POA: Diagnosis not present

## 2011-11-15 DIAGNOSIS — M76899 Other specified enthesopathies of unspecified lower limb, excluding foot: Secondary | ICD-10-CM | POA: Diagnosis not present

## 2011-12-04 DIAGNOSIS — M48061 Spinal stenosis, lumbar region without neurogenic claudication: Secondary | ICD-10-CM | POA: Diagnosis not present

## 2011-12-06 ENCOUNTER — Encounter (HOSPITAL_COMMUNITY): Payer: Self-pay | Admitting: Pharmacy Technician

## 2011-12-11 DIAGNOSIS — E559 Vitamin D deficiency, unspecified: Secondary | ICD-10-CM | POA: Diagnosis not present

## 2011-12-11 DIAGNOSIS — Z23 Encounter for immunization: Secondary | ICD-10-CM | POA: Diagnosis not present

## 2011-12-11 DIAGNOSIS — N390 Urinary tract infection, site not specified: Secondary | ICD-10-CM | POA: Diagnosis not present

## 2011-12-11 DIAGNOSIS — E119 Type 2 diabetes mellitus without complications: Secondary | ICD-10-CM | POA: Diagnosis not present

## 2011-12-11 DIAGNOSIS — E785 Hyperlipidemia, unspecified: Secondary | ICD-10-CM | POA: Diagnosis not present

## 2011-12-11 DIAGNOSIS — E039 Hypothyroidism, unspecified: Secondary | ICD-10-CM | POA: Diagnosis not present

## 2011-12-11 DIAGNOSIS — D649 Anemia, unspecified: Secondary | ICD-10-CM | POA: Diagnosis not present

## 2011-12-13 ENCOUNTER — Other Ambulatory Visit: Payer: Self-pay | Admitting: Neurosurgery

## 2011-12-17 ENCOUNTER — Encounter (HOSPITAL_COMMUNITY)
Admission: RE | Admit: 2011-12-17 | Discharge: 2011-12-17 | Disposition: A | Discharge status: Home / Self Care | Payer: Medicare Other | Source: Ambulatory Visit | Attending: Neurosurgery | Admitting: Neurosurgery

## 2011-12-17 ENCOUNTER — Ambulatory Visit (HOSPITAL_COMMUNITY)
Admission: RE | Admit: 2011-12-17 | Discharge: 2011-12-17 | Disposition: A | Discharge status: Home / Self Care | Payer: Medicare Other | Source: Ambulatory Visit | Attending: Anesthesiology | Admitting: Anesthesiology

## 2011-12-17 ENCOUNTER — Encounter (HOSPITAL_COMMUNITY): Payer: Self-pay

## 2011-12-17 DIAGNOSIS — Z01818 Encounter for other preprocedural examination: Secondary | ICD-10-CM | POA: Insufficient documentation

## 2011-12-17 DIAGNOSIS — Z01812 Encounter for preprocedural laboratory examination: Secondary | ICD-10-CM | POA: Diagnosis not present

## 2011-12-17 DIAGNOSIS — I517 Cardiomegaly: Secondary | ICD-10-CM | POA: Insufficient documentation

## 2011-12-17 DIAGNOSIS — Z01811 Encounter for preprocedural respiratory examination: Secondary | ICD-10-CM | POA: Diagnosis not present

## 2011-12-17 HISTORY — DX: Encounter for other specified aftercare: Z51.89

## 2011-12-17 HISTORY — DX: Major depressive disorder, single episode, unspecified: F32.9

## 2011-12-17 HISTORY — DX: Unspecified osteoarthritis, unspecified site: M19.90

## 2011-12-17 HISTORY — DX: Anemia, unspecified: D64.9

## 2011-12-17 HISTORY — DX: Depression, unspecified: F32.A

## 2011-12-17 HISTORY — DX: Personal history of other diseases of the digestive system: Z87.19

## 2011-12-17 HISTORY — DX: Heart failure, unspecified: I50.9

## 2011-12-17 HISTORY — DX: Frequency of micturition: R35.0

## 2011-12-17 HISTORY — DX: Atherosclerotic heart disease of native coronary artery without angina pectoris: I25.10

## 2011-12-17 HISTORY — DX: Gastro-esophageal reflux disease without esophagitis: K21.9

## 2011-12-17 HISTORY — DX: Spinal stenosis, site unspecified: M48.00

## 2011-12-17 HISTORY — DX: Acute myocardial infarction, unspecified: I21.9

## 2011-12-17 HISTORY — DX: Hypothyroidism, unspecified: E03.9

## 2011-12-17 HISTORY — DX: Reserved for inherently not codable concepts without codable children: IMO0001

## 2011-12-17 HISTORY — DX: Urinary tract infection, site not specified: N39.0

## 2011-12-17 HISTORY — DX: Shortness of breath: R06.02

## 2011-12-17 HISTORY — DX: Essential (primary) hypertension: I10

## 2011-12-17 LAB — CBC
HCT: 39.7 % (ref 36.0–46.0)
MCHC: 34.8 g/dL (ref 30.0–36.0)
Platelets: 162 10*3/uL (ref 150–400)
RDW: 13 % (ref 11.5–15.5)

## 2011-12-17 LAB — SURGICAL PCR SCREEN: MRSA, PCR: NEGATIVE

## 2011-12-17 LAB — DIFFERENTIAL
Basophils Absolute: 0 10*3/uL (ref 0.0–0.1)
Basophils Relative: 0 % (ref 0–1)
Eosinophils Relative: 5 % (ref 0–5)
Monocytes Absolute: 0.5 10*3/uL (ref 0.1–1.0)
Neutro Abs: 4.8 10*3/uL (ref 1.7–7.7)

## 2011-12-17 LAB — TYPE AND SCREEN: ABO/RH(D): B POS

## 2011-12-17 NOTE — Progress Notes (Signed)
Requested Revonda Standard to review cardiac notes, Dr. Katrinka Blazing, echo 02/28/11.

## 2011-12-17 NOTE — Pre-Procedure Instructions (Signed)
20 Abigail Brooks  12/17/2011   Your procedure is scheduled on:  Friday Dec 21, 2011  Report to [facility] at 0730 AM.  Call this number if you have problems the morning of surgery: [phone]   Remember:   Do not eat food:After Midnight.  May have clear liquids: up to 4 Hours before arrival. (up to 3:30am)  Clear liquids include soda, tea, black coffee, apple or grape juice, broth.  Take these medicines the morning of surgery with A SIP OF WATER: percocet, oxybutynin   Do not wear jewelry, make-up or nail polish.  Do not wear lotions, powders, or perfumes. You may wear deodorant.  Do not shave 48 hours prior to surgery.  Do not bring valuables to the hospital.  Contacts, dentures or bridgework may not be worn into surgery.  Leave suitcase in the car. After surgery it may be brought to your room.  For patients admitted to the hospital, checkout time is 11:00 AM the day of discharge.   Patients discharged the day of surgery will not be allowed to drive home.  Name and phone number of your driver: Arwen Haseley [phone]  Special Instructions: CHG Shower Use Special Wash: 1/2 bottle night before surgery and 1/2 bottle morning of surgery.   Please read over the following fact sheets that you were given: Pain Booklet, Coughing and Deep Breathing, MRSA Information and Surgical Site Infection Prevention

## 2011-12-18 NOTE — Consult Note (Signed)
Anesthesia Chart Review:  Patient is a 77 year old female scheduled for bilateral L4-5 decompression laminectomy, right L 4-5 micro diskectomy on 12/21/11.  History includes former smoker, CAD/MI s/p DES RCA and mid-CX 04/2005, CHF, SOB, DM2, anemia, hypothyroidism, HH, depression, HTN, GERD, arthritis, spinal stenosis.  BMI is ~ 29.  EKG on 12/17/11 showed SB @ 56, LAD, LAFB, anterolateral infarct (age undetermined).  R wave progression is worse since 02/26/11, but probably not significantly changed from her EKG on 12/01/09 or from her [facility] EKGs from 2006 and 06/22/09.    Her Cardiologist is Dr. Katrinka Blazing. He last saw her for annual follow-up on 06/24/11.  By his notes, her diastolic HF was stable off diuretic therapy, and she was symptomatic from a CAD standpoint.  One year follow-up was recommended. Prior to that she was hospitalized in July 2012 for acute on chronic diastolic heart failure.  Cardiac enzymes were negative for acute MI.    Nuclear stress test on 02/28/11 showed: Normal study demonstrating no evidence of myocardial ischemia. No significant scar is identified. Normal left ventricular function with quantitative ejection fraction of 77%.  Echo on 02/28/11 showed moderate LVH, vigorous LV systolic function, EF 65-70%, small pericardial effusion, trivial MR/PR.  She had a cardiac cath on 05/03/05 that showed: 1. Severe right coronary disease, with segmental greater than 90% stenosis in the mid to distal vessel. There is also significant disease of mid and distal circumflex. The LAD is large and widely patent.  2. Previously demonstrated normal LV function, with EF greater than 64% by Cardiolite scintigraphy wall motion study. She subsequently underwent  DES to her RCA (05/03/05) and mid-CX (05/10/05).  CXR from 12/17/11 showed stable cardiomegaly since at least July 2012. No active lung disease.   Labs noted.  For some reason, BMET was not done at her PAT appointment, so it is scheduled to  be done on the day of surgery.  Her Cr in July 2012 was WNL.  She was seen by her Cardiologist within the last five months and was felt to be stable.  No chest pain symptoms were documented at her PAT visit.  She does have some chronic SOB, but CXR was stable, sats were 96%, and her weight was actually down a couple of pounds since her last visit with Dr. Katrinka Blazing.  She has had similar appearing EKGs in the past and has since had a normal nuclear stress test ( < 1 year ago).  If no new anginal or CHF symptoms and new labs results reasonable, then anticipate she can proceed as planned.   Shonna Chock, PA-C

## 2011-12-18 NOTE — Anesthesia Preprocedure Evaluation (Addendum)
Anesthesia Evaluation  Patient identified by MRN, date of birth, ID band Patient awake    Reviewed: Allergy & Precautions, H&P , NPO status , Patient's Chart, lab work & pertinent test results  Airway Mallampati: II TM Distance: <3 FB Neck ROM: full    Dental  (+) Edentulous Upper   Pulmonary shortness of breath, sleep apnea ,          Cardiovascular Exercise Tolerance: Poor hypertension, + CAD, + Past MI and +CHF Rhythm:regular Rate:Normal     Neuro/Psych PSYCHIATRIC DISORDERS    GI/Hepatic hiatal hernia, GERD-  ,  Endo/Other  Diabetes mellitus-, Type 2, Oral Hypoglycemic AgentsMorbid obesity  Renal/GU      Musculoskeletal   Abdominal   Peds  Hematology   Anesthesia Other Findings   Reproductive/Obstetrics                          Anesthesia Physical Anesthesia Plan  ASA: III  Anesthesia Plan: General   Post-op Pain Management:    Induction: Intravenous  Airway Management Planned: Oral ETT  Additional Equipment:   Intra-op Plan:   Post-operative Plan: Extubation in OR  Informed Consent: I have reviewed the patients History and Physical, chart, labs and discussed the procedure including the risks, benefits and alternatives for the proposed anesthesia with the patient or authorized representative who has indicated his/her understanding and acceptance.     Plan Discussed with: CRNA, Anesthesiologist and Surgeon  Anesthesia Plan Comments:       Anesthesia Quick Evaluation

## 2011-12-20 MED ORDER — DEXAMETHASONE SODIUM PHOSPHATE 10 MG/ML IJ SOLN
10.0000 mg | INTRAMUSCULAR | Status: DC
  Filled 2011-12-20: qty 1

## 2011-12-20 MED ORDER — CEFAZOLIN SODIUM 1-5 GM-% IV SOLN: 1.0000 g | INTRAVENOUS | Status: DC

## 2011-12-20 MED ORDER — CEFAZOLIN SODIUM-DEXTROSE 2-3 GM-% IV SOLR
2.0000 g | INTRAVENOUS | Status: AC
  Administered 2011-12-21: 2 g via INTRAVENOUS
  Filled 2011-12-20: qty 50

## 2011-12-21 ENCOUNTER — Encounter (HOSPITAL_COMMUNITY): Payer: Self-pay | Admitting: *Deleted

## 2011-12-21 ENCOUNTER — Encounter (HOSPITAL_COMMUNITY)
Admission: RE | Disposition: A | Discharge status: Home / Self Care | Payer: Self-pay | Source: Ambulatory Visit | Attending: Neurosurgery

## 2011-12-21 ENCOUNTER — Inpatient Hospital Stay (HOSPITAL_COMMUNITY): Payer: Medicare Other | Admitting: Vascular Surgery

## 2011-12-21 ENCOUNTER — Inpatient Hospital Stay (HOSPITAL_COMMUNITY)
Admission: RE | Admit: 2011-12-21 | Discharge: 2011-12-22 | DRG: 491 | Disposition: A | Discharge status: Home / Self Care | Payer: Medicare Other | Source: Ambulatory Visit | Attending: Neurosurgery | Admitting: Neurosurgery

## 2011-12-21 ENCOUNTER — Encounter (HOSPITAL_COMMUNITY): Payer: Self-pay | Admitting: Vascular Surgery

## 2011-12-21 ENCOUNTER — Inpatient Hospital Stay (HOSPITAL_COMMUNITY): Payer: Medicare Other

## 2011-12-21 DIAGNOSIS — M545 Low back pain, unspecified: Secondary | ICD-10-CM | POA: Diagnosis not present

## 2011-12-21 DIAGNOSIS — E039 Hypothyroidism, unspecified: Secondary | ICD-10-CM | POA: Diagnosis present

## 2011-12-21 DIAGNOSIS — M79609 Pain in unspecified limb: Secondary | ICD-10-CM | POA: Diagnosis not present

## 2011-12-21 DIAGNOSIS — E119 Type 2 diabetes mellitus without complications: Secondary | ICD-10-CM | POA: Diagnosis present

## 2011-12-21 DIAGNOSIS — M48062 Spinal stenosis, lumbar region with neurogenic claudication: Secondary | ICD-10-CM | POA: Diagnosis present

## 2011-12-21 DIAGNOSIS — I2510 Atherosclerotic heart disease of native coronary artery without angina pectoris: Secondary | ICD-10-CM | POA: Diagnosis not present

## 2011-12-21 DIAGNOSIS — I252 Old myocardial infarction: Secondary | ICD-10-CM | POA: Diagnosis not present

## 2011-12-21 DIAGNOSIS — M5137 Other intervertebral disc degeneration, lumbosacral region: Secondary | ICD-10-CM | POA: Diagnosis not present

## 2011-12-21 DIAGNOSIS — M51379 Other intervertebral disc degeneration, lumbosacral region without mention of lumbar back pain or lower extremity pain: Secondary | ICD-10-CM | POA: Diagnosis present

## 2011-12-21 DIAGNOSIS — I10 Essential (primary) hypertension: Secondary | ICD-10-CM | POA: Diagnosis present

## 2011-12-21 DIAGNOSIS — M48061 Spinal stenosis, lumbar region without neurogenic claudication: Secondary | ICD-10-CM | POA: Diagnosis not present

## 2011-12-21 DIAGNOSIS — M5126 Other intervertebral disc displacement, lumbar region: Secondary | ICD-10-CM | POA: Diagnosis not present

## 2011-12-21 DIAGNOSIS — Z981 Arthrodesis status: Secondary | ICD-10-CM | POA: Diagnosis not present

## 2011-12-21 HISTORY — PX: LUMBAR LAMINECTOMY/DECOMPRESSION MICRODISCECTOMY: SHX5026

## 2011-12-21 LAB — GLUCOSE, CAPILLARY
Glucose-Capillary: 150 mg/dL — ABNORMAL HIGH (ref 70–99)
Glucose-Capillary: 127 mg/dL — ABNORMAL HIGH (ref 70–99)
Glucose-Capillary: 229 mg/dL — ABNORMAL HIGH (ref 70–99)
Glucose-Capillary: 229 mg/dL — ABNORMAL HIGH (ref 70–99)

## 2011-12-21 LAB — BASIC METABOLIC PANEL
CO2: 30 mEq/L (ref 19–32)
Glucose, Bld: 145 mg/dL — ABNORMAL HIGH (ref 70–99)
Potassium: 3.4 mEq/L — ABNORMAL LOW (ref 3.5–5.1)
Sodium: 140 mEq/L (ref 135–145)

## 2011-12-21 SURGERY — LUMBAR LAMINECTOMY/DECOMPRESSION MICRODISCECTOMY 1 LEVEL
Anesthesia: General | Site: Back | Laterality: Bilateral | Wound class: Clean

## 2011-12-21 MED ORDER — METFORMIN HCL 500 MG PO TABS
500.0000 mg | ORAL_TABLET | Freq: Two times a day (BID) | ORAL | Status: DC
  Administered 2011-12-21 – 2011-12-22 (×2): 500 mg via ORAL
  Filled 2011-12-21 (×4): qty 1

## 2011-12-21 MED ORDER — OMEGA-3-ACID ETHYL ESTERS 1 G PO CAPS
1.0000 g | ORAL_CAPSULE | Freq: Every day | ORAL | Status: DC
  Administered 2011-12-22: 1 g via ORAL
  Filled 2011-12-21: qty 1

## 2011-12-21 MED ORDER — HYDROMORPHONE HCL PF 1 MG/ML IJ SOLN: 0.5000 mg | INTRAMUSCULAR | Status: DC | PRN

## 2011-12-21 MED ORDER — CITALOPRAM HYDROBROMIDE 20 MG PO TABS
20.0000 mg | ORAL_TABLET | Freq: Every day | ORAL | Status: DC
  Administered 2011-12-22: 20 mg via ORAL
  Filled 2011-12-21: qty 1

## 2011-12-21 MED ORDER — ALUM & MAG HYDROXIDE-SIMETH 200-200-20 MG/5ML PO SUSP: 30.0000 mL | Freq: Four times a day (QID) | ORAL | Status: DC | PRN

## 2011-12-21 MED ORDER — ONDANSETRON HCL 4 MG/2ML IJ SOLN: 4.0000 mg | INTRAMUSCULAR | Status: DC | PRN

## 2011-12-21 MED ORDER — BUPIVACAINE HCL (PF) 0.25 % IJ SOLN
INTRAMUSCULAR | Status: DC | PRN
  Administered 2011-12-21: 20 mL

## 2011-12-21 MED ORDER — ZOLPIDEM TARTRATE 5 MG PO TABS
5.0000 mg | ORAL_TABLET | Freq: Every evening | ORAL | Status: DC | PRN
  Administered 2011-12-21: 5 mg via ORAL
  Filled 2011-12-21: qty 1

## 2011-12-21 MED ORDER — HYDROCHLOROTHIAZIDE 12.5 MG PO CAPS
12.5000 mg | ORAL_CAPSULE | Freq: Every day | ORAL | Status: DC
  Administered 2011-12-22: 12.5 mg via ORAL
  Filled 2011-12-21: qty 1

## 2011-12-21 MED ORDER — POLYETHYLENE GLYCOL 3350 17 G PO PACK
17.0000 g | PACK | Freq: Every day | ORAL | Status: DC | PRN
  Filled 2011-12-21: qty 1

## 2011-12-21 MED ORDER — KETOROLAC TROMETHAMINE 15 MG/ML IJ SOLN
INTRAMUSCULAR | Status: DC | PRN
  Administered 2011-12-21: 15 mg via INTRAVENOUS

## 2011-12-21 MED ORDER — ASPIRIN EC 325 MG PO TBEC
325.0000 mg | DELAYED_RELEASE_TABLET | Freq: Every day | ORAL | Status: DC
  Administered 2011-12-21 – 2011-12-22 (×2): 325 mg via ORAL
  Filled 2011-12-21 (×3): qty 1

## 2011-12-21 MED ORDER — LOSARTAN POTASSIUM 50 MG PO TABS
50.0000 mg | ORAL_TABLET | Freq: Every day | ORAL | Status: DC
  Administered 2011-12-22: 50 mg via ORAL
  Filled 2011-12-21: qty 1

## 2011-12-21 MED ORDER — LACTATED RINGERS IV SOLN
INTRAVENOUS | Status: DC | PRN
  Administered 2011-12-21 (×2): via INTRAVENOUS

## 2011-12-21 MED ORDER — METHYLCELLULOSE 1 % OP SOLN: 2.0000 [drp] | OPHTHALMIC | Status: DC | PRN

## 2011-12-21 MED ORDER — ACETAMINOPHEN 650 MG RE SUPP: 650.0000 mg | RECTAL | Status: DC | PRN

## 2011-12-21 MED ORDER — OXYBUTYNIN CHLORIDE ER 5 MG PO TB24
5.0000 mg | ORAL_TABLET | Freq: Every day | ORAL | Status: DC
  Administered 2011-12-22: 5 mg via ORAL
  Filled 2011-12-21: qty 1

## 2011-12-21 MED ORDER — ONDANSETRON HCL 4 MG/2ML IJ SOLN
INTRAMUSCULAR | Status: DC | PRN
  Administered 2011-12-21: 4 mg via INTRAVENOUS

## 2011-12-21 MED ORDER — 0.9 % SODIUM CHLORIDE (POUR BTL) OPTIME
TOPICAL | Status: DC | PRN
  Administered 2011-12-21: 1000 mL

## 2011-12-21 MED ORDER — ONDANSETRON HCL 4 MG/2ML IJ SOLN: 4.0000 mg | Freq: Once | INTRAMUSCULAR | Status: DC | PRN

## 2011-12-21 MED ORDER — SODIUM CHLORIDE 0.9 % IR SOLN
Status: DC | PRN
  Administered 2011-12-21: 11:00:00

## 2011-12-21 MED ORDER — LIDOCAINE HCL (CARDIAC) 20 MG/ML IV SOLN
INTRAVENOUS | Status: DC | PRN
  Administered 2011-12-21: 70 mg via INTRAVENOUS

## 2011-12-21 MED ORDER — SIMVASTATIN 40 MG PO TABS
40.0000 mg | ORAL_TABLET | Freq: Every evening | ORAL | Status: DC
  Administered 2011-12-21: 40 mg via ORAL
  Filled 2011-12-21 (×2): qty 1

## 2011-12-21 MED ORDER — HYDROCODONE-ACETAMINOPHEN 5-325 MG PO TABS
1.0000 | ORAL_TABLET | ORAL | Status: DC | PRN
  Administered 2011-12-22 (×2): 2 via ORAL
  Filled 2011-12-21 (×2): qty 2

## 2011-12-21 MED ORDER — THROMBIN 5000 UNITS EX KIT
PACK | CUTANEOUS | Status: DC | PRN
  Administered 2011-12-21 (×2): 5000 [IU] via TOPICAL

## 2011-12-21 MED ORDER — SODIUM CHLORIDE 0.9 % IV SOLN
INTRAVENOUS | Status: AC
  Filled 2011-12-21: qty 500

## 2011-12-21 MED ORDER — LOSARTAN POTASSIUM-HCTZ 50-12.5 MG PO TABS: 1.0000 | ORAL_TABLET | Freq: Every day | ORAL | Status: DC

## 2011-12-21 MED ORDER — HYDROMORPHONE HCL PF 1 MG/ML IJ SOLN: 0.2500 mg | INTRAMUSCULAR | Status: DC | PRN

## 2011-12-21 MED ORDER — FLEET ENEMA 7-19 GM/118ML RE ENEM
1.0000 | ENEMA | Freq: Once | RECTAL | Status: AC | PRN
  Filled 2011-12-21: qty 1

## 2011-12-21 MED ORDER — CYCLOBENZAPRINE HCL 10 MG PO TABS
10.0000 mg | ORAL_TABLET | Freq: Three times a day (TID) | ORAL | Status: DC | PRN
  Filled 2011-12-21 (×2): qty 1

## 2011-12-21 MED ORDER — PHENOL 1.4 % MT LIQD: 1.0000 | OROMUCOSAL | Status: DC | PRN

## 2011-12-21 MED ORDER — ROCURONIUM BROMIDE 100 MG/10ML IV SOLN
INTRAVENOUS | Status: DC | PRN
  Administered 2011-12-21: 50 mg via INTRAVENOUS

## 2011-12-21 MED ORDER — POLYVINYL ALCOHOL 1.4 % OP SOLN
2.0000 [drp] | OPHTHALMIC | Status: DC | PRN
  Filled 2011-12-21: qty 15

## 2011-12-21 MED ORDER — OMEGA-3 FATTY ACIDS 1000 MG PO CAPS: 1.0000 g | ORAL_CAPSULE | Freq: Every day | ORAL | Status: DC

## 2011-12-21 MED ORDER — BISACODYL 10 MG RE SUPP: 10.0000 mg | Freq: Every day | RECTAL | Status: DC | PRN

## 2011-12-21 MED ORDER — CEFAZOLIN SODIUM 1-5 GM-% IV SOLN
1.0000 g | Freq: Three times a day (TID) | INTRAVENOUS | Status: AC
  Administered 2011-12-21 – 2011-12-22 (×2): 1 g via INTRAVENOUS
  Filled 2011-12-21 (×2): qty 50

## 2011-12-21 MED ORDER — SENNA 8.6 MG PO TABS
1.0000 | ORAL_TABLET | Freq: Two times a day (BID) | ORAL | Status: DC
  Administered 2011-12-21 – 2011-12-22 (×2): 8.6 mg via ORAL
  Filled 2011-12-21 (×3): qty 1

## 2011-12-21 MED ORDER — ACETAMINOPHEN 325 MG PO TABS: 650.0000 mg | ORAL_TABLET | ORAL | Status: DC | PRN

## 2011-12-21 MED ORDER — HEMOSTATIC AGENTS (NO CHARGE) OPTIME
TOPICAL | Status: DC | PRN
  Administered 2011-12-21: 1 via TOPICAL

## 2011-12-21 MED ORDER — SODIUM CHLORIDE 0.9 % IV SOLN
250.0000 mL | INTRAVENOUS | Status: DC
  Administered 2011-12-21: 250 mL via INTRAVENOUS

## 2011-12-21 MED ORDER — KETOROLAC TROMETHAMINE 15 MG/ML IJ SOLN
15.0000 mg | Freq: Four times a day (QID) | INTRAMUSCULAR | Status: DC
  Administered 2011-12-21 – 2011-12-22 (×3): 15 mg via INTRAVENOUS
  Filled 2011-12-21 (×9): qty 1

## 2011-12-21 MED ORDER — GLYCOPYRROLATE 0.2 MG/ML IJ SOLN
INTRAMUSCULAR | Status: DC | PRN
  Administered 2011-12-21: 0.2 mg via INTRAVENOUS
  Administered 2011-12-21: .8 mg via INTRAVENOUS

## 2011-12-21 MED ORDER — FENTANYL CITRATE 0.05 MG/ML IJ SOLN
INTRAMUSCULAR | Status: DC | PRN
  Administered 2011-12-21 (×2): 100 ug via INTRAVENOUS

## 2011-12-21 MED ORDER — SODIUM CHLORIDE 0.9 % IJ SOLN: 3.0000 mL | INTRAMUSCULAR | Status: DC | PRN

## 2011-12-21 MED ORDER — OXYCODONE-ACETAMINOPHEN 5-325 MG PO TABS
1.0000 | ORAL_TABLET | ORAL | Status: DC | PRN
  Administered 2011-12-21 (×2): 2 via ORAL
  Filled 2011-12-21 (×2): qty 2

## 2011-12-21 MED ORDER — PROPOFOL 10 MG/ML IV EMUL
INTRAVENOUS | Status: DC | PRN
  Administered 2011-12-21: 200 mg via INTRAVENOUS

## 2011-12-21 MED ORDER — MENTHOL 3 MG MT LOZG: 1.0000 | LOZENGE | OROMUCOSAL | Status: DC | PRN

## 2011-12-21 MED ORDER — SODIUM CHLORIDE 0.9 % IJ SOLN
3.0000 mL | Freq: Two times a day (BID) | INTRAMUSCULAR | Status: DC
  Administered 2011-12-21 (×2): 3 mL via INTRAVENOUS

## 2011-12-21 MED ORDER — BACITRACIN 50000 UNITS IM SOLR
INTRAMUSCULAR | Status: AC
  Filled 2011-12-21: qty 1

## 2011-12-21 MED ORDER — CLONAZEPAM 0.5 MG PO TABS: 0.5000 mg | ORAL_TABLET | Freq: Two times a day (BID) | ORAL | Status: DC | PRN

## 2011-12-21 MED ORDER — NEOSTIGMINE METHYLSULFATE 1 MG/ML IJ SOLN
INTRAMUSCULAR | Status: DC | PRN
  Administered 2011-12-21: 5 mg via INTRAVENOUS

## 2011-12-21 SURGICAL SUPPLY — 54 items
BAG DECANTER FOR FLEXI CONT (MISCELLANEOUS) ×2 IMPLANT
BENZOIN TINCTURE PRP APPL 2/3 (GAUZE/BANDAGES/DRESSINGS) ×2 IMPLANT
BLADE SURG ROTATE 9660 (MISCELLANEOUS) IMPLANT
BRUSH SCRUB EZ PLAIN DRY (MISCELLANEOUS) ×2 IMPLANT
BUR CUTTER 7.0 ROUND (BURR) ×2 IMPLANT
CANISTER SUCTION 2500CC (MISCELLANEOUS) ×2 IMPLANT
CLOTH BEACON ORANGE TIMEOUT ST (SAFETY) ×2 IMPLANT
CONT SPEC 4OZ CLIKSEAL STRL BL (MISCELLANEOUS) ×2 IMPLANT
DECANTER SPIKE VIAL GLASS SM (MISCELLANEOUS) IMPLANT
DERMABOND ADVANCED (GAUZE/BANDAGES/DRESSINGS) ×1
DERMABOND ADVANCED .7 DNX12 (GAUZE/BANDAGES/DRESSINGS) ×1 IMPLANT
DRAPE LAPAROTOMY 100X72X124 (DRAPES) ×2 IMPLANT
DRAPE MICROSCOPE ZEISS OPMI (DRAPES) ×2 IMPLANT
DRAPE POUCH INSTRU U-SHP 10X18 (DRAPES) ×2 IMPLANT
DRAPE PROXIMA HALF (DRAPES) IMPLANT
DRAPE SURG 17X23 STRL (DRAPES) ×4 IMPLANT
ELECT REM PT RETURN 9FT ADLT (ELECTROSURGICAL) ×2
ELECTRODE REM PT RTRN 9FT ADLT (ELECTROSURGICAL) ×1 IMPLANT
GAUZE SPONGE 4X4 16PLY XRAY LF (GAUZE/BANDAGES/DRESSINGS) IMPLANT
GLOVE BIO SURGEON STRL SZ8 (GLOVE) ×2 IMPLANT
GLOVE BIOGEL PI IND STRL 6.5 (GLOVE) ×1 IMPLANT
GLOVE BIOGEL PI IND STRL 7.0 (GLOVE) ×2 IMPLANT
GLOVE BIOGEL PI IND STRL 8.5 (GLOVE) ×1 IMPLANT
GLOVE BIOGEL PI INDICATOR 6.5 (GLOVE) ×1
GLOVE BIOGEL PI INDICATOR 7.0 (GLOVE) ×2
GLOVE BIOGEL PI INDICATOR 8.5 (GLOVE) ×1
GLOVE ECLIPSE 8.5 STRL (GLOVE) ×2 IMPLANT
GLOVE EXAM NITRILE LRG STRL (GLOVE) IMPLANT
GLOVE EXAM NITRILE MD LF STRL (GLOVE) IMPLANT
GLOVE EXAM NITRILE XL STR (GLOVE) IMPLANT
GLOVE EXAM NITRILE XS STR PU (GLOVE) IMPLANT
GLOVE SS BIOGEL STRL SZ 6.5 (GLOVE) ×3 IMPLANT
GLOVE SUPERSENSE BIOGEL SZ 6.5 (GLOVE) ×3
GLOVE SURG SS PI 6.5 STRL IVOR (GLOVE) ×6 IMPLANT
GOWN BRE IMP SLV AUR LG STRL (GOWN DISPOSABLE) ×4 IMPLANT
GOWN BRE IMP SLV AUR XL STRL (GOWN DISPOSABLE) ×2 IMPLANT
GOWN STRL REIN 2XL LVL4 (GOWN DISPOSABLE) IMPLANT
KIT BASIN OR (CUSTOM PROCEDURE TRAY) ×2 IMPLANT
KIT ROOM TURNOVER OR (KITS) ×2 IMPLANT
NEEDLE HYPO 22GX1.5 SAFETY (NEEDLE) ×2 IMPLANT
NEEDLE SPNL 22GX3.5 QUINCKE BK (NEEDLE) ×2 IMPLANT
NS IRRIG 1000ML POUR BTL (IV SOLUTION) ×2 IMPLANT
PACK LAMINECTOMY NEURO (CUSTOM PROCEDURE TRAY) ×2 IMPLANT
PAD ARMBOARD 7.5X6 YLW CONV (MISCELLANEOUS) ×6 IMPLANT
RUBBERBAND STERILE (MISCELLANEOUS) ×4 IMPLANT
SPONGE GAUZE 4X4 12PLY (GAUZE/BANDAGES/DRESSINGS) ×2 IMPLANT
SPONGE SURGIFOAM ABS GEL SZ50 (HEMOSTASIS) ×2 IMPLANT
STRIP CLOSURE SKIN 1/2X4 (GAUZE/BANDAGES/DRESSINGS) ×2 IMPLANT
SUT VIC AB 2-0 CT1 18 (SUTURE) ×2 IMPLANT
SUT VIC AB 3-0 SH 8-18 (SUTURE) ×2 IMPLANT
SYR 20ML ECCENTRIC (SYRINGE) ×2 IMPLANT
TOWEL OR 17X24 6PK STRL BLUE (TOWEL DISPOSABLE) ×2 IMPLANT
TOWEL OR 17X26 10 PK STRL BLUE (TOWEL DISPOSABLE) ×2 IMPLANT
WATER STERILE IRR 1000ML POUR (IV SOLUTION) ×2 IMPLANT

## 2011-12-21 NOTE — Anesthesia Procedure Notes (Signed)
Procedure Name: Intubation Date/Time: 12/21/2011 10:21 AM Performed by: Rossie Muskrat L Pre-anesthesia Checklist: Patient identified, Patient being monitored, Timeout performed, Emergency Drugs available and Suction available Patient Re-evaluated:Patient Re-evaluated prior to inductionOxygen Delivery Method: Circle system utilized Preoxygenation: Pre-oxygenation with 100% oxygen Intubation Type: IV induction Ventilation: Mask ventilation without difficulty Laryngoscope Size: Miller and 2 Grade View: Grade I Tube type: Oral Tube size: 7.5 mm Number of attempts: 1 Airway Equipment and Method: Stylet Placement Confirmation: ETT inserted through vocal cords under direct vision,  breath sounds checked- equal and bilateral and positive ETCO2 Secured at: 21 cm Tube secured with: Tape Dental Injury: Teeth and Oropharynx as per pre-operative assessment

## 2011-12-21 NOTE — Op Note (Signed)
Date of procedure: 12/21/2011  Date of dictation: Same  Service: Neurosurgery  Preoperative diagnosis: L4-5 stenosis with neurogenic claudication, right L4-5 herniated nucleus pulposus with a copy  Postoperative diagnosis: Same  Procedure Name: Bilateral L4-5 decompressive laminotomies with bilateral L4 and L5 decompressive foraminotomies.  Right L4-5 microdiscectomy  Surgeon:Makila Colombe A.Karmina Zufall, M.D.  Asst. Surgeon: Wynetta Emery  Anesthesia: General  Indication: 77 year old female with back and bilateral lower extremity pain consistent with neurogenic claudication. Workup demonstrates evidence of marked stenosis at L4-5 complicated by a right-sided L4-5 disc herniation and early L4-5 degenerative spondylolisthesis. Patient presents now for bilateral L4-5 decompressive laminotomy with foraminotomies.  Operative note: After induction of anesthesia, patient positioned prone onto the Wilson frame and appropriate padded. Lumbar region prepped and draped sterilely. Incision made overlying the L4-5 interspace. Subperiosteal dissection performed exposing the lamina and facet joints L4 and L5 bilaterally. X-ray taken, level confirmed. Decompressive laminotomies performed using high-speed drill and Kerrison rongeurs bilaterally by removing the inferior aspect of lamina of L4 medial aspect the L4-5 facet joint and the superior rim of the L5 lamina. Ligamentum flavum elevated and resected these will fashion using Kerrison rongeurs. Epidural venous plexus was quite good and cut. Decompressive foraminotomies form of course exiting L4 and L5 nerve roots bilaterally. Microscope brought into the field these were microdissection of the right-sided L5 nerve root and underlying disc herniation. Thecal sac and right L5 nerve root gently mobilized and retracted towards midline. The disc herniation incised with a 15 blade in a rectangular fashion. Discectomy performed using pituitary rongeurs operative of rongeurs and Epstein  curettes. All elements the disc herniation plate resected. All loose are obvious he jammed his. His interspace. At this point a very thorough decompression been achieved. Wound is irrigated with and bike solution. Gelfoam was placed topically for hemostasis over both laminotomies. Microscope and retractor system were removed. Hemostasis of the muscle achieved with electrocautery. Wounds and close in layers. Steri-Strips triggers were applied. There were no apparent complications. Patient tolerated the procedure well and returned to the recovery room.

## 2011-12-21 NOTE — Brief Op Note (Signed)
12/21/2011  12:07 PM  PATIENT:  Michela Pitcher  77 y.o. female  PRE-OPERATIVE DIAGNOSIS:  Lumbar four-five herniated nucleus pulposus, stenosis  POST-OPERATIVE DIAGNOSIS:  Lumbar four-five herniated nucleus pulposus, stenosis  PROCEDURE:  Procedure(s) (LRB): LUMBAR LAMINECTOMY/DECOMPRESSION MICRODISCECTOMY 1 LEVEL (Bilateral)  SURGEON:  Surgeon(s) and Role:    * Temple Pacini, MD - Primary    * Mariam Dollar, MD - Assisting  PHYSICIAN ASSISTANT:   ASSISTANTS: none   ANESTHESIA:   general  EBL:  Total I/O In: 1200 [I.V.:1200] Out: -   BLOOD ADMINISTERED:none  DRAINS: none   LOCAL MEDICATIONS USED:  MARCAINE     SPECIMEN:  No Specimen  DISPOSITION OF SPECIMEN:  N/A  COUNTS:  YES  TOURNIQUET:  * No tourniquets in log *  DICTATION: .Dragon Dictation  PLAN OF CARE: Admit to inpatient   PATIENT DISPOSITION:  PACU - hemodynamically stable.   Delay start of Pharmacological VTE agent (>24hrs) due to surgical blood loss or risk of bleeding: yes

## 2011-12-21 NOTE — Preoperative (Signed)
Beta Blockers   Reason not to administer Beta Blockers:Not Applicable 

## 2011-12-21 NOTE — Transfer of Care (Signed)
Immediate Anesthesia Transfer of Care Note  Patient: Abigail Brooks  Procedure(s) Performed: Procedure(s) (LRB): LUMBAR LAMINECTOMY/DECOMPRESSION MICRODISCECTOMY 1 LEVEL (Bilateral)  Patient Location: PACU  Anesthesia Type: General  Level of Consciousness: awake, alert , oriented and patient cooperative  Airway & Oxygen Therapy: Patient Spontanous Breathing and Patient connected to nasal cannula oxygen  Post-op Assessment: Report given to PACU RN and Post -op Vital signs reviewed and stable  Post vital signs: Reviewed and stable  Complications: No apparent anesthesia complications

## 2011-12-21 NOTE — Anesthesia Postprocedure Evaluation (Signed)
  Anesthesia Post-op Note  Patient: Abigail Brooks  Procedure(s) Performed: Procedure(s) (LRB): LUMBAR LAMINECTOMY/DECOMPRESSION MICRODISCECTOMY 1 LEVEL (Bilateral)  Patient Location: PACU  Anesthesia Type: General  Level of Consciousness: awake, oriented, sedated and patient cooperative  Airway and Oxygen Therapy: Patient Spontanous Breathing and Patient connected to nasal cannula oxygen  Post-op Pain: mild  Post-op Assessment: Post-op Vital signs reviewed, Patient's Cardiovascular Status Stable, Respiratory Function Stable, Patent Airway, No signs of Nausea or vomiting and Pain level controlled  Post-op Vital Signs: stable  Complications: No apparent anesthesia complications

## 2011-12-21 NOTE — H&P (Signed)
Abigail Brooks is an 77 y.o. female.   Chief Complaint: Bilateral lower extremity pain HPI: 77 year old female with bilateral lower extremity pain worsened by standing or prolonged ambulation consistent with neurogenic claudication. Patient's failed conservative management including epidural steroid injections rest and activity modifications. Workup demonstrates evidence of significant spondylosis with disc degeneration and a right-sided paracentral disc herniation L4-5 causing moderately severe stenosis bilaterally. She's had previous right-sided L5-S1 laminotomy and discectomy remotely in the past. Patient presents now for bilateral L4-5 decompressive laminotomy with a right-sided L4-5 microdiscectomy.  Past Medical History  Diagnosis Date  . Coronary artery disease     stents x 2, sees Dr. Halina Brooks practice  . Congestive heart failure     2012  . Myocardial infarction     "unsure of when"  . Hypertension   . Diabetes mellitus   . Shortness of breath   . Anemia   . Blood transfusion     when had hysterectomy  . Hypothyroidism     has had hx of treatment  . Urinary frequency   . Urinary tract infection     hx of  . GERD (gastroesophageal reflux disease)   . H/O hiatal hernia   . Arthritis   . Depression   . Spinal stenosis     with bone spurs and ruptured disc    Past Surgical History  Procedure Date  . Cataract extraction w/ intraocular lens  implant, bilateral   . Bladder suspension   . Abdominal hysterectomy   . Coronary     stents x 2, stent implant placed9/21/06  . Back surgery     ruptured disc, lumbar area  . Appendectomy   . Cardiac catheterization     Family History  Problem Relation Age of Onset  . Anesthesia problems Sister   . Anesthesia problems Son    Social History:  reports that she has quit smoking. Her smoking use included Cigarettes. Her smokeless tobacco use includes Snuff. She reports that she does not drink alcohol or use illicit  drugs.  Allergies: No Known Allergies  Medications Prior to Admission  Medication Sig Dispense Refill  . aspirin EC 325 MG tablet Take 325 mg by mouth daily.      . Bisacodyl (DULCOLAX PO) Take 1-2 capsules by mouth daily as needed. For constipation      . Calcium Carbonate-Vitamin D (CALCIUM + D PO) Take 1 tablet by mouth daily.      . Cholecalciferol (VITAMIN D PO) Take 2 tablets by mouth daily.      . citalopram (CELEXA) 20 MG tablet Take 20 mg by mouth daily.      . clonazePAM (KLONOPIN) 0.5 MG tablet Take 0.5 mg by mouth 2 (two) times daily as needed.      . fish oil-omega-3 fatty acids 1000 MG capsule Take 1 g by mouth daily.      Marland Kitchen losartan-hydrochlorothiazide (HYZAAR) 50-12.5 MG per tablet Take 1 tablet by mouth daily.      . metFORMIN (GLUCOPHAGE) 500 MG tablet Take 500 mg by mouth 2 (two) times daily with a meal.      . oxybutynin (DITROPAN-XL) 5 MG 24 hr tablet Take 5 mg by mouth daily.      Marland Kitchen oxyCODONE-acetaminophen (PERCOCET) 5-325 MG per tablet Take 1 tablet by mouth every 4 (four) hours as needed. For pain      . simvastatin (ZOCOR) 40 MG tablet Take 40 mg by mouth every evening.      Marland Kitchen  methylcellulose (ARTIFICIAL TEARS) 1 % ophthalmic solution Place 2 drops into both eyes as needed. For dry eyes        Results for orders placed during the hospital encounter of 12/21/11 (from the past 48 hour(s))  BASIC METABOLIC PANEL     Status: Abnormal   Collection Time   12/21/11  7:28 AM      Component Value Range Comment   Sodium 140  135 - 145 (mEq/L)    Potassium 3.4 (*) 3.5 - 5.1 (mEq/L)    Chloride 100  96 - 112 (mEq/L)    CO2 30  19 - 32 (mEq/L)    Glucose, Bld 145 (*) 70 - 99 (mg/dL)    BUN 14  6 - 23 (mg/dL)    Creatinine, Ser 4.09  0.50 - 1.10 (mg/dL)    Calcium 9.5  8.4 - 10.5 (mg/dL)    GFR calc non Af Amer 81 (*) >90 (mL/min)    GFR calc Af Amer >90  >90 (mL/min)   GLUCOSE, CAPILLARY     Status: Abnormal   Collection Time   12/21/11  7:40 AM      Component Value  Range Comment   Glucose-Capillary 150 (*) 70 - 99 (mg/dL)    No results found.  Review of Systems  Constitutional: Negative.   HENT: Negative.   Eyes: Negative.   Respiratory: Negative.   Cardiovascular: Negative.   Gastrointestinal: Negative.   Genitourinary: Negative.   Musculoskeletal: Negative.   Skin: Negative.   Neurological: Negative.   Endo/Heme/Allergies: Negative.   Psychiatric/Behavioral: Negative.     Blood pressure 130/69, pulse 52, temperature 98.8 F (37.1 C), temperature source Oral, resp. rate 20, SpO2 93.00%. Physical Exam  Constitutional: She is oriented to person, place, and time. She appears well-developed and well-nourished.  HENT:  Head: Normocephalic and atraumatic.  Right Ear: External ear normal.  Left Ear: External ear normal.  Nose: Nose normal.  Mouth/Throat: Oropharynx is clear and moist.  Eyes: Conjunctivae and EOM are normal. Pupils are equal, round, and reactive to light.  Neck: Normal range of motion. Neck supple. No tracheal deviation present. No thyromegaly present.  Cardiovascular: Normal rate, regular rhythm, normal heart sounds and intact distal pulses.   Respiratory: Effort normal and breath sounds normal. No respiratory distress. She has no wheezes.  GI: Soft. Bowel sounds are normal. She exhibits no distension. There is no tenderness.  Musculoskeletal: Normal range of motion. She exhibits no edema and no tenderness.  Neurological: She is alert and oriented to person, place, and time. She has normal reflexes. No cranial nerve deficit. Coordination normal.  Skin: Skin is warm and dry. No rash noted. No erythema. No pallor.  Psychiatric: She has a normal mood and affect. Her behavior is normal. Judgment and thought content normal.     Assessment/Plan L4-5 stenosis with neurogenic claudication. Plan bilateral L4-5 decompressive laminotomy with foraminotomies and right-sided L4-5 microdiscectomy. Risks and benefits been explained. Patient  wishes to proceed.  Abigail Brooks A 12/21/2011, 9:28 AM

## 2011-12-21 NOTE — Progress Notes (Signed)
Dr. Katrinka Blazing aware patient drank "about an inch of black coffee" at 0600.

## 2011-12-22 LAB — GLUCOSE, CAPILLARY

## 2011-12-22 MED ORDER — CYCLOBENZAPRINE HCL 10 MG PO TABS: 10.0000 mg | ORAL_TABLET | Freq: Three times a day (TID) | ORAL | Status: AC | PRN

## 2011-12-22 MED ORDER — HYDROCODONE-ACETAMINOPHEN 5-325 MG PO TABS: 1.0000 | ORAL_TABLET | ORAL | Status: AC | PRN

## 2011-12-22 NOTE — Discharge Instructions (Addendum)
No lifting no bending no twisting no driving unless she's come back and forth to see Dr. Jordan Likes. Wound Care Keep incision covered and dry for one week.  If you shower prior to then, cover incision with plastic wrap.  You may remove outer bandage after one week and shower.  Do not put any creams, lotions, or ointments on incision. Leave steri-strips on neck.  They will fall off by themselves. Activity Walk each and every day, increasing distance each day. No lifting greater than 5 lbs.  Avoid excessive neck motion. No driving for 2 weeks; may ride as a passenger locally. If provided with back brace, wear when out of bed.  It is not necessary to wear brace in bed. Diet Resume your normal diet.  Return to Work Will be discussed at you follow up appointment. Call Your Doctor If Any of These Occur Redness, drainage, or swelling at the wound.  Temperature greater than 101 degrees. Severe pain not relieved by pain medication. Incision starts to come apart. Follow Up Appt Call today for appointment in 1-2 weeks ([phone]) or for problems.  If you have any hardware placed in your spine, you will need an x-ray before your appointment.

## 2011-12-22 NOTE — Progress Notes (Signed)
Subjective: Patient reports The zm is much better than preop.  Objective: Vital signs in last 24 hours: Temp:  [96.8 F (36 C)-98.8 F (37.1 C)] 97.8 F (36.6 C) (05/04 0400) Pulse Rate:  [40-98] 56  (05/04 0614) Resp:  [16-28] 18  (05/04 0400) BP: (100-199)/(39-85) 171/79 mmHg (05/04 0614) SpO2:  [22 %-98 %] 96 % (05/04 0400)  Intake/Output from previous day: 05/03 0701 - 05/04 0700 In: 1920 [P.O.:720; I.V.:1200] Out: -  Intake/Output this shift:    Strength is 5 out of 5 wound is clean and dry  Lab Results: No results found for this basename: WBC:2,HGB:2,HCT:2,PLT:2 in the last 72 hours BMET  [facility] 12/21/11 0728  NA 140  K 3.4*  CL 100  CO2 30  GLUCOSE 145*  BUN 14  CREATININE 0.74  CALCIUM 9.5    Studies/Results: Dg Lumbar Spine 1 View  12/21/2011  *RADIOLOGY REPORT*  Clinical Data: L4-L5 laminectomy.  LUMBAR SPINE - 1 VIEW  Comparison: Lumbar spine MRI from 09/15/2011.  Findings: Cross-table portable lateral view of the lumbar spine obtained at 1045 hours.  Same numbering scheme used on this film as was employed on the lumbar spine MRI.  Retractors are noted in the soft tissues of the lower back.  Surgical probe is positioned with the tip overlying the L4-5 facets.  IMPRESSION: Intraoperative localization.  Original Report Authenticated By: ERIC A. MANSELL, M.D.    Assessment/Plan: Postoperative day 1 from laminectomy doing very well plan discharge home the  LOS: 1 day     Vernon Maish P 12/22/2011, 7:15 AM

## 2011-12-22 NOTE — Discharge Summary (Signed)
  Physician Discharge Summary  Patient ID: Abigail Brooks MRN: [id number] DOB/AGE: 20-Feb-1935 77 y.o.  Admit date: 12/21/2011 Discharge date: 12/22/2011  Admission Diagnoses: Lumbar spondylosis with radiculopathy  Discharge Diagnoses: Same Principal Problem:  *Lumbar stenosis with neurogenic claudication   Discharged Condition: good  Hospital Course: Patient is in the hospital underwent lumbar lengthening discectomy postop patient did very well with recovered in the floor on the floor patient, as well as his angling and voiding spontaneously significant improvement preoperative pain was he'll be discharged home.  Consults: Significant Diagnostic Studies: Treatments: Lumbar laminectomy and discectomy Discharge Exam: Blood pressure 171/79, pulse 56, temperature 97.8 F (36.6 C), temperature source Oral, resp. rate 18, SpO2 96.00%. Strength out of 5 wound clean and dry.  Disposition: Home   Medication List  As of 12/22/2011  7:17 AM   TAKE these medications         aspirin EC 325 MG tablet   Take 325 mg by mouth daily.      CALCIUM + D PO   Take 1 tablet by mouth daily.      citalopram 20 MG tablet   Commonly known as: CELEXA   Take 20 mg by mouth daily.      clonazePAM 0.5 MG tablet   Commonly known as: KLONOPIN   Take 0.5 mg by mouth 2 (two) times daily as needed.      cyclobenzaprine 10 MG tablet   Commonly known as: FLEXERIL   Take 1 tablet (10 mg total) by mouth 3 (three) times daily as needed for muscle spasms.      DULCOLAX PO   Take 1-2 capsules by mouth daily as needed. For constipation      fish oil-omega-3 fatty acids 1000 MG capsule   Take 1 g by mouth daily.      HYDROcodone-acetaminophen 5-325 MG per tablet   Commonly known as: NORCO   Take 1-2 tablets by mouth every 4 (four) hours as needed.      losartan-hydrochlorothiazide 50-12.5 MG per tablet   Commonly known as: HYZAAR   Take 1 tablet by mouth daily.      metFORMIN 500 MG tablet   Commonly  known as: GLUCOPHAGE   Take 500 mg by mouth 2 (two) times daily with a meal.      methylcellulose 1 % ophthalmic solution   Commonly known as: ARTIFICIAL TEARS   Place 2 drops into both eyes as needed. For dry eyes      oxybutynin 5 MG 24 hr tablet   Commonly known as: DITROPAN-XL   Take 5 mg by mouth daily.      oxyCODONE-acetaminophen 5-325 MG per tablet   Commonly known as: PERCOCET   Take 1 tablet by mouth every 4 (four) hours as needed. For pain      simvastatin 40 MG tablet   Commonly known as: ZOCOR   Take 40 mg by mouth every evening.      VITAMIN D PO   Take 2 tablets by mouth daily.             Signed: Jazier Mcglamery P 12/22/2011, 7:17 AM

## 2011-12-22 NOTE — Progress Notes (Addendum)
BP 199/80, HR 56. Pt medicated for pain per request. Will monitor and recheck BP in 1-2 hrs.  Update @ 0615 BP 171/79.

## 2011-12-24 ENCOUNTER — Encounter (HOSPITAL_COMMUNITY): Payer: Self-pay | Admitting: Neurosurgery

## 2011-12-26 ENCOUNTER — Other Ambulatory Visit: Payer: Medicare Other

## 2011-12-26 ENCOUNTER — Ambulatory Visit: Payer: Medicare Other

## 2012-01-25 ENCOUNTER — Other Ambulatory Visit: Payer: Self-pay | Admitting: Neurosurgery

## 2012-01-25 DIAGNOSIS — M48061 Spinal stenosis, lumbar region without neurogenic claudication: Secondary | ICD-10-CM

## 2012-02-03 ENCOUNTER — Ambulatory Visit
Admission: RE | Admit: 2012-02-03 | Discharge: 2012-02-03 | Disposition: A | Discharge status: Home / Self Care | Payer: Medicare Other | Source: Ambulatory Visit | Attending: Neurosurgery | Admitting: Neurosurgery

## 2012-02-03 DIAGNOSIS — M47817 Spondylosis without myelopathy or radiculopathy, lumbosacral region: Secondary | ICD-10-CM | POA: Diagnosis not present

## 2012-02-03 DIAGNOSIS — M5137 Other intervertebral disc degeneration, lumbosacral region: Secondary | ICD-10-CM | POA: Diagnosis not present

## 2012-02-03 DIAGNOSIS — M48061 Spinal stenosis, lumbar region without neurogenic claudication: Secondary | ICD-10-CM

## 2012-02-03 DIAGNOSIS — M5126 Other intervertebral disc displacement, lumbar region: Secondary | ICD-10-CM | POA: Diagnosis not present

## 2012-02-03 MED ORDER — GADOBENATE DIMEGLUMINE 529 MG/ML IV SOLN
20.0000 mL | Freq: Once | INTRAVENOUS | Status: AC | PRN
  Administered 2012-02-03: 20 mL via INTRAVENOUS

## 2012-03-18 DIAGNOSIS — F411 Generalized anxiety disorder: Secondary | ICD-10-CM | POA: Diagnosis not present

## 2012-03-18 DIAGNOSIS — E1159 Type 2 diabetes mellitus with other circulatory complications: Secondary | ICD-10-CM | POA: Diagnosis not present

## 2012-03-18 DIAGNOSIS — M4800 Spinal stenosis, site unspecified: Secondary | ICD-10-CM | POA: Diagnosis not present

## 2012-03-18 DIAGNOSIS — M5430 Sciatica, unspecified side: Secondary | ICD-10-CM | POA: Diagnosis not present

## 2012-03-25 DIAGNOSIS — F411 Generalized anxiety disorder: Secondary | ICD-10-CM | POA: Diagnosis not present

## 2012-03-25 DIAGNOSIS — M545 Low back pain: Secondary | ICD-10-CM | POA: Diagnosis not present

## 2012-03-25 DIAGNOSIS — E1159 Type 2 diabetes mellitus with other circulatory complications: Secondary | ICD-10-CM | POA: Diagnosis not present

## 2012-03-25 DIAGNOSIS — IMO0002 Reserved for concepts with insufficient information to code with codable children: Secondary | ICD-10-CM | POA: Diagnosis not present

## 2012-04-08 DIAGNOSIS — M48061 Spinal stenosis, lumbar region without neurogenic claudication: Secondary | ICD-10-CM | POA: Diagnosis not present

## 2012-04-23 DIAGNOSIS — I509 Heart failure, unspecified: Secondary | ICD-10-CM | POA: Diagnosis not present

## 2012-04-23 DIAGNOSIS — I2510 Atherosclerotic heart disease of native coronary artery without angina pectoris: Secondary | ICD-10-CM | POA: Diagnosis not present

## 2012-04-23 DIAGNOSIS — IMO0002 Reserved for concepts with insufficient information to code with codable children: Secondary | ICD-10-CM | POA: Diagnosis not present

## 2012-04-23 DIAGNOSIS — E1159 Type 2 diabetes mellitus with other circulatory complications: Secondary | ICD-10-CM | POA: Diagnosis not present

## 2012-06-19 DIAGNOSIS — M48061 Spinal stenosis, lumbar region without neurogenic claudication: Secondary | ICD-10-CM | POA: Diagnosis not present

## 2012-07-29 DIAGNOSIS — E559 Vitamin D deficiency, unspecified: Secondary | ICD-10-CM | POA: Diagnosis not present

## 2012-07-29 DIAGNOSIS — E785 Hyperlipidemia, unspecified: Secondary | ICD-10-CM | POA: Diagnosis not present

## 2012-07-29 DIAGNOSIS — E039 Hypothyroidism, unspecified: Secondary | ICD-10-CM | POA: Diagnosis not present

## 2012-07-29 DIAGNOSIS — D649 Anemia, unspecified: Secondary | ICD-10-CM | POA: Diagnosis not present

## 2012-07-29 DIAGNOSIS — E538 Deficiency of other specified B group vitamins: Secondary | ICD-10-CM | POA: Diagnosis not present

## 2012-07-31 DIAGNOSIS — I2510 Atherosclerotic heart disease of native coronary artery without angina pectoris: Secondary | ICD-10-CM | POA: Diagnosis not present

## 2012-07-31 DIAGNOSIS — D649 Anemia, unspecified: Secondary | ICD-10-CM | POA: Diagnosis not present

## 2012-07-31 DIAGNOSIS — E1159 Type 2 diabetes mellitus with other circulatory complications: Secondary | ICD-10-CM | POA: Diagnosis not present

## 2012-07-31 DIAGNOSIS — IMO0002 Reserved for concepts with insufficient information to code with codable children: Secondary | ICD-10-CM | POA: Diagnosis not present

## 2012-08-07 DIAGNOSIS — M48061 Spinal stenosis, lumbar region without neurogenic claudication: Secondary | ICD-10-CM | POA: Diagnosis not present

## 2012-08-10 ENCOUNTER — Encounter (HOSPITAL_COMMUNITY): Payer: Self-pay

## 2012-08-10 ENCOUNTER — Emergency Department (HOSPITAL_COMMUNITY)
Admission: EM | Admit: 2012-08-10 | Discharge: 2012-08-10 | Disposition: A | Discharge status: Home / Self Care | Payer: Medicare Other | Attending: Emergency Medicine | Admitting: Emergency Medicine

## 2012-08-10 ENCOUNTER — Ambulatory Visit (INDEPENDENT_AMBULATORY_CARE_PROVIDER_SITE_OTHER): Payer: Medicare Other | Admitting: Emergency Medicine

## 2012-08-10 VITALS — BP 174/77 | HR 56 | Temp 98.4°F | Resp 18 | Ht 59.0 in | Wt 191.0 lb

## 2012-08-10 DIAGNOSIS — M545 Low back pain, unspecified: Secondary | ICD-10-CM | POA: Diagnosis not present

## 2012-08-10 DIAGNOSIS — Z7982 Long term (current) use of aspirin: Secondary | ICD-10-CM | POA: Insufficient documentation

## 2012-08-10 DIAGNOSIS — Z862 Personal history of diseases of the blood and blood-forming organs and certain disorders involving the immune mechanism: Secondary | ICD-10-CM | POA: Diagnosis not present

## 2012-08-10 DIAGNOSIS — E039 Hypothyroidism, unspecified: Secondary | ICD-10-CM | POA: Diagnosis not present

## 2012-08-10 DIAGNOSIS — I2510 Atherosclerotic heart disease of native coronary artery without angina pectoris: Secondary | ICD-10-CM | POA: Insufficient documentation

## 2012-08-10 DIAGNOSIS — Z8719 Personal history of other diseases of the digestive system: Secondary | ICD-10-CM | POA: Diagnosis not present

## 2012-08-10 DIAGNOSIS — K219 Gastro-esophageal reflux disease without esophagitis: Secondary | ICD-10-CM | POA: Diagnosis not present

## 2012-08-10 DIAGNOSIS — D649 Anemia, unspecified: Secondary | ICD-10-CM | POA: Insufficient documentation

## 2012-08-10 DIAGNOSIS — Z8679 Personal history of other diseases of the circulatory system: Secondary | ICD-10-CM | POA: Insufficient documentation

## 2012-08-10 DIAGNOSIS — Z9089 Acquired absence of other organs: Secondary | ICD-10-CM | POA: Insufficient documentation

## 2012-08-10 DIAGNOSIS — E119 Type 2 diabetes mellitus without complications: Secondary | ICD-10-CM | POA: Diagnosis not present

## 2012-08-10 DIAGNOSIS — I252 Old myocardial infarction: Secondary | ICD-10-CM | POA: Diagnosis not present

## 2012-08-10 DIAGNOSIS — N390 Urinary tract infection, site not specified: Secondary | ICD-10-CM

## 2012-08-10 DIAGNOSIS — R300 Dysuria: Secondary | ICD-10-CM | POA: Insufficient documentation

## 2012-08-10 DIAGNOSIS — I10 Essential (primary) hypertension: Secondary | ICD-10-CM | POA: Insufficient documentation

## 2012-08-10 DIAGNOSIS — Z8709 Personal history of other diseases of the respiratory system: Secondary | ICD-10-CM | POA: Diagnosis not present

## 2012-08-10 DIAGNOSIS — Z87891 Personal history of nicotine dependence: Secondary | ICD-10-CM | POA: Diagnosis not present

## 2012-08-10 DIAGNOSIS — R82998 Other abnormal findings in urine: Secondary | ICD-10-CM | POA: Diagnosis not present

## 2012-08-10 DIAGNOSIS — F3289 Other specified depressive episodes: Secondary | ICD-10-CM | POA: Insufficient documentation

## 2012-08-10 DIAGNOSIS — Z8739 Personal history of other diseases of the musculoskeletal system and connective tissue: Secondary | ICD-10-CM | POA: Diagnosis not present

## 2012-08-10 DIAGNOSIS — F329 Major depressive disorder, single episode, unspecified: Secondary | ICD-10-CM | POA: Diagnosis not present

## 2012-08-10 DIAGNOSIS — G8929 Other chronic pain: Secondary | ICD-10-CM | POA: Insufficient documentation

## 2012-08-10 DIAGNOSIS — Z87448 Personal history of other diseases of urinary system: Secondary | ICD-10-CM | POA: Insufficient documentation

## 2012-08-10 DIAGNOSIS — E669 Obesity, unspecified: Secondary | ICD-10-CM | POA: Insufficient documentation

## 2012-08-10 DIAGNOSIS — Z79899 Other long term (current) drug therapy: Secondary | ICD-10-CM | POA: Diagnosis not present

## 2012-08-10 DIAGNOSIS — R8281 Pyuria: Secondary | ICD-10-CM

## 2012-08-10 DIAGNOSIS — M129 Arthropathy, unspecified: Secondary | ICD-10-CM | POA: Diagnosis not present

## 2012-08-10 DIAGNOSIS — Z87440 Personal history of urinary (tract) infections: Secondary | ICD-10-CM | POA: Insufficient documentation

## 2012-08-10 LAB — URINALYSIS, ROUTINE W REFLEX MICROSCOPIC
Bilirubin Urine: NEGATIVE
Glucose, UA: NEGATIVE mg/dL
Hgb urine dipstick: NEGATIVE
Protein, ur: NEGATIVE mg/dL
Urobilinogen, UA: 0.2 mg/dL (ref 0.0–1.0)

## 2012-08-10 LAB — POCT CBC
Lymph, poc: 1.7 (ref 0.6–3.4)
MCH, POC: 29.7 pg (ref 27–31.2)
MCHC: 32.6 g/dL (ref 31.8–35.4)
MID (cbc): 0.6 (ref 0–0.9)
MPV: 7.7 fL (ref 0–99.8)
POC LYMPH PERCENT: 26.9 %L (ref 10–50)
POC MID %: 9.5 %M (ref 0–12)
Platelet Count, POC: 230 10*3/uL (ref 142–424)
RBC: 4.71 M/uL (ref 4.04–5.48)
RDW, POC: 13.9 %
WBC: 6.2 10*3/uL (ref 4.6–10.2)

## 2012-08-10 LAB — COMPREHENSIVE METABOLIC PANEL
ALT: 13 U/L (ref 0–35)
AST: 19 U/L (ref 0–37)
Albumin: 3.7 g/dL (ref 3.5–5.2)
CO2: 31 mEq/L (ref 19–32)
Chloride: 100 mEq/L (ref 96–112)
GFR calc non Af Amer: 69 mL/min — ABNORMAL LOW (ref >90)
Potassium: 4.4 mEq/L (ref 3.5–5.1)
Sodium: 140 mEq/L (ref 135–145)
Total Bilirubin: 0.6 mg/dL (ref 0.3–1.2)

## 2012-08-10 LAB — POCT URINALYSIS DIPSTICK
Glucose, UA: NEGATIVE
Spec Grav, UA: 1.02
Urobilinogen, UA: 0.2

## 2012-08-10 LAB — URINE MICROSCOPIC-ADD ON

## 2012-08-10 LAB — CBC WITH DIFFERENTIAL/PLATELET
Basophils Absolute: 0 10*3/uL (ref 0.0–0.1)
Basophils Relative: 0 % (ref 0–1)
HCT: 40.8 % (ref 36.0–46.0)
Lymphocytes Relative: 25 % (ref 12–46)
Neutro Abs: 4 10*3/uL (ref 1.7–7.7)
Neutrophils Relative %: 61 % (ref 43–77)
Platelets: 200 10*3/uL (ref 150–400)
RDW: 13 % (ref 11.5–15.5)
WBC: 6.6 10*3/uL (ref 4.0–10.5)

## 2012-08-10 LAB — POCT UA - MICROSCOPIC ONLY: RBC, urine, microscopic: NEGATIVE

## 2012-08-10 MED ORDER — MORPHINE SULFATE 4 MG/ML IJ SOLN
4.0000 mg | Freq: Once | INTRAMUSCULAR | Status: AC
  Administered 2012-08-10: 4 mg via INTRAVENOUS
  Filled 2012-08-10: qty 1

## 2012-08-10 MED ORDER — ONDANSETRON HCL 4 MG/2ML IJ SOLN
4.0000 mg | Freq: Once | INTRAMUSCULAR | Status: AC
  Administered 2012-08-10: 4 mg via INTRAVENOUS
  Filled 2012-08-10: qty 2

## 2012-08-10 MED ORDER — PHENAZOPYRIDINE HCL 200 MG PO TABS: 200.0000 mg | ORAL_TABLET | Freq: Three times a day (TID) | ORAL | Status: DC

## 2012-08-10 MED ORDER — DEXTROSE 5 % IV SOLN
1.0000 g | Freq: Once | INTRAVENOUS | Status: AC
  Administered 2012-08-10: 15:00:00 via INTRAVENOUS
  Filled 2012-08-10: qty 10

## 2012-08-10 MED ORDER — CEFUROXIME AXETIL 500 MG PO TABS: 500.0000 mg | ORAL_TABLET | Freq: Two times a day (BID) | ORAL | Status: DC

## 2012-08-10 NOTE — Progress Notes (Deleted)
  Subjective:    Patient ID: Abigail Brooks, female    DOB: August 12, 1935, 77 y.o.   MRN: [id number]  HPI    Review of Systems     Objective:   Physical Exam        Assessment & Plan:

## 2012-08-10 NOTE — Progress Notes (Deleted)
  Subjective:    Patient ID: Abigail Brooks, female    DOB: 10-16-1934, 77 y.o.   MRN: [id number]  HPI  77 year old female here with lower back pain, both hips, and groin pain.  Started hurting this morning.  A little burning with urination.  Has a history of back trouble.  Had back surgery six months ago.  Pain medication is not helping with this back pain.    Review of Systems     Objective:   Physical Exam        Assessment & Plan:

## 2012-08-10 NOTE — ED Notes (Signed)
Patient reports that she has been having lower abdominal pain x 1 week. Patient went to an Urgent Care today and was sent to the ED for further evaluation and treatment.

## 2012-08-10 NOTE — ED Provider Notes (Addendum)
History     CSN: [id number]  Arrival date & time 08/10/12  1218   First MD Initiated Contact with Patient 08/10/12 1324      Chief Complaint  Patient presents with  . Abdominal Pain  . Urinary Tract Infection    (Consider location/radiation/quality/duration/timing/severity/associated sxs/prior treatment) HPI Complaint of low abdominal pain radiating to the back onset this morning. Patient had been complaining of low back pain for approximately one week treated with oxycodone with partial relief. Patient admits to burning with urination at urethral meatus. No vomiting no fever no other associated symptoms patient seen at urgent care center today sent here for further evaluation. Pain is worse with urination with changing position denies anorexia last bowel movement yesterday, normal Past Medical History  Diagnosis Date  . Coronary artery disease     stents x 2, sees Dr. Halina Andreas practice  . Congestive heart failure     2012  . Myocardial infarction     "unsure of when"  . Hypertension   . Diabetes mellitus   . Shortness of breath   . Anemia   . Blood transfusion     when had hysterectomy  . Hypothyroidism     has had hx of treatment  . Urinary frequency   . Urinary tract infection     hx of  . GERD (gastroesophageal reflux disease)   . H/O hiatal hernia   . Arthritis   . Depression   . Spinal stenosis     with bone spurs and ruptured disc  . Cataract   . Blood transfusion without reported diagnosis    chronic back pain  Past Surgical History  Procedure Date  . Cataract extraction w/ intraocular lens  implant, bilateral   . Bladder suspension   . Abdominal hysterectomy   . Coronary     stents x 2, stent implant placed9/21/06  . Back surgery     ruptured disc, lumbar area  . Appendectomy   . Cardiac catheterization   . Lumbar laminectomy/decompression microdiscectomy 12/21/2011    Procedure: LUMBAR LAMINECTOMY/DECOMPRESSION MICRODISCECTOMY 1 LEVEL;  Surgeon:  Temple Pacini, MD;  Location: [facility];  Service: Neurosurgery;  Laterality: Bilateral;  Lumbar four-five decompressive lumbar laminectomy; Right Lumbar four-five microdiscectomy  . Eye surgery   . Spine surgery     Family History  Problem Relation Age of Onset  . Anesthesia problems Sister   . Anesthesia problems Son     History  Substance Use Topics  . Smoking status: Former Smoker    Types: Cigarettes  . Smokeless tobacco: Current User    Types: Snuff     Comment: 40 years ago  . Alcohol Use: No    OB History    Grav Para Term Preterm Abortions TAB SAB Ect Mult Living                  Review of Systems  Gastrointestinal: Positive for abdominal pain.  Genitourinary: Positive for dysuria.  Musculoskeletal: Positive for back pain.    Allergies  Review of patient's allergies indicates no known allergies.  Home Medications   Current Outpatient Rx  Name  Route  Sig  Dispense  Refill  . ASPIRIN EC 325 MG PO TBEC   Oral   Take 325 mg by mouth daily.         Marland Kitchen CALCIUM + D PO   Oral   Take 1 tablet by mouth daily.         Marland Kitchen  VITAMIN D PO   Oral   Take 2 tablets by mouth daily.         Marland Kitchen CITALOPRAM HYDROBROMIDE 40 MG PO TABS   Oral   Take 40 mg by mouth daily.         Marland Kitchen CLONAZEPAM 0.5 MG PO TABS   Oral   Take 0.5 mg by mouth 2 (two) times daily as needed. For anxiety.         . DOCUSATE SODIUM 100 MG PO CAPS   Oral   Take 200 mg by mouth 2 (two) times daily as needed. For stool softener.         Marland Kitchen FERROUS SULFATE 325 (65 FE) MG PO TABS   Oral   Take 325 mg by mouth daily with breakfast.         . OMEGA-3 FATTY ACIDS 1000 MG PO CAPS   Oral   Take 1 g by mouth daily.         Marland Kitchen LOSARTAN POTASSIUM-HCTZ 100-12.5 MG PO TABS   Oral   Take 1 tablet by mouth daily.         Marland Kitchen METFORMIN HCL 500 MG PO TABS   Oral   Take 500 mg by mouth 2 (two) times daily with a meal.         . METHOCARBAMOL 500 MG PO TABS   Oral   Take 1,000 mg by  mouth 3 (three) times daily.         Marland Kitchen METOPROLOL TARTRATE 100 MG PO TABS   Oral   Take 100 mg by mouth 2 (two) times daily.         Marland Kitchen NITROGLYCERIN 0.4 MG SL SUBL   Sublingual   Place 0.4 mg under the tongue every 5 (five) minutes as needed. For chest pain.         . OXYBUTYNIN CHLORIDE ER 5 MG PO TB24   Oral   Take 5 mg by mouth daily.         . OXYCODONE-ACETAMINOPHEN 5-325 MG PO TABS   Oral   Take 1 tablet by mouth every 4 (four) hours as needed. For pain         . SIMVASTATIN 40 MG PO TABS   Oral   Take 40 mg by mouth every evening.           BP 157/74  Pulse 51  Temp 98.7 F (37.1 C) (Oral)  Resp 16  SpO2 96%  Physical Exam  Nursing note and vitals reviewed. Constitutional: She appears well-developed and well-nourished. No distress.  HENT:  Head: Normocephalic and atraumatic.  Eyes: Conjunctivae normal are normal. Pupils are equal, round, and reactive to light.  Neck: Neck supple. No tracheal deviation present. No thyromegaly present.  Cardiovascular: Normal rate and regular rhythm.   No murmur heard. Pulmonary/Chest: Effort normal and breath sounds normal.  Abdominal: Soft. Bowel sounds are normal. She exhibits no distension. There is tenderness.       Obese tender suprapubic area, with quadrant and right lower quadrants  Genitourinary:       No flank tenderness  Musculoskeletal: Normal range of motion. She exhibits no edema and no tenderness.  Neurological: She is alert. Coordination normal.  Skin: Skin is warm and dry. No rash noted.  Psychiatric: She has a normal mood and affect.    ED Course  Procedures (including critical care time)  Labs Reviewed  CBC WITH DIFFERENTIAL - Abnormal; Notable for the following:  Eosinophils Relative 7 (*)     All other components within normal limits  COMPREHENSIVE METABOLIC PANEL - Abnormal; Notable for the following:    Glucose, Bld 108 (*)     BUN 25 (*)     GFR calc non Af Amer 69 (*)     GFR  calc Af Amer 80 (*)     All other components within normal limits  URINALYSIS, ROUTINE W REFLEX MICROSCOPIC   No results found. Results for orders placed during the hospital encounter of 08/10/12  CBC WITH DIFFERENTIAL      Component Value Range   WBC 6.6  4.0 - 10.5 K/uL   RBC 4.77  3.87 - 5.11 MIL/uL   Hemoglobin 14.3  12.0 - 15.0 g/dL   HCT 96.0  45.4 - 09.8 %   MCV 85.5  78.0 - 100.0 fL   MCH 30.0  26.0 - 34.0 pg   MCHC 35.0  30.0 - 36.0 g/dL   RDW 11.9  14.7 - 82.9 %   Platelets 200  150 - 400 K/uL   Neutrophils Relative 61  43 - 77 %   Neutro Abs 4.0  1.7 - 7.7 K/uL   Lymphocytes Relative 25  12 - 46 %   Lymphs Abs 1.6  0.7 - 4.0 K/uL   Monocytes Relative 7  3 - 12 %   Monocytes Absolute 0.5  0.1 - 1.0 K/uL   Eosinophils Relative 7 (*) 0 - 5 %   Eosinophils Absolute 0.5  0.0 - 0.7 K/uL   Basophils Relative 0  0 - 1 %   Basophils Absolute 0.0  0.0 - 0.1 K/uL  COMPREHENSIVE METABOLIC PANEL      Component Value Range   Sodium 140  135 - 145 mEq/L   Potassium 4.4  3.5 - 5.1 mEq/L   Chloride 100  96 - 112 mEq/L   CO2 31  19 - 32 mEq/L   Glucose, Bld 108 (*) 70 - 99 mg/dL   BUN 25 (*) 6 - 23 mg/dL   Creatinine, Ser 5.62  0.50 - 1.10 mg/dL   Calcium 9.7  8.4 - 13.0 mg/dL   Total Protein 6.9  6.0 - 8.3 g/dL   Albumin 3.7  3.5 - 5.2 g/dL   AST 19  0 - 37 U/L   ALT 13  0 - 35 U/L   Alkaline Phosphatase 56  39 - 117 U/L   Total Bilirubin 0.6  0.3 - 1.2 mg/dL   GFR calc non Af Amer 69 (*) >90 mL/min   GFR calc Af Amer 80 (*) >90 mL/min  URINALYSIS, ROUTINE W REFLEX MICROSCOPIC      Component Value Range   Color, Urine YELLOW  YELLOW   APPearance CLOUDY (*) CLEAR   Specific Gravity, Urine 1.022  1.005 - 1.030   pH 5.5  5.0 - 8.0   Glucose, UA NEGATIVE  NEGATIVE mg/dL   Hgb urine dipstick NEGATIVE  NEGATIVE   Bilirubin Urine NEGATIVE  NEGATIVE   Ketones, ur NEGATIVE  NEGATIVE mg/dL   Protein, ur NEGATIVE  NEGATIVE mg/dL   Urobilinogen, UA 0.2  0.0 - 1.0 mg/dL    Nitrite POSITIVE (*) NEGATIVE   Leukocytes, UA LARGE (*) NEGATIVE  URINE MICROSCOPIC-ADD ON      Component Value Range   Squamous Epithelial / LPF RARE  RARE   WBC, UA 21-50  <3 WBC/hpf   Bacteria, UA MANY (*) RARE   No results found.   No  diagnosis found.  4:10 PM feels improved after treatment with intervenous narcoticand Rocephin MDM  Plan prescription Ceftin, Pyridium urine sent for culture blood pressure recheck 1-2 weeks Diagnosis #1 urinary tract infection #2 hypertension #3 chronic back pain        Doug Sou, MD 08/10/12 1616  Doug Sou, MD 08/10/12 [phone]

## 2012-08-10 NOTE — Progress Notes (Signed)
Subjective:    Patient ID: Abigail Brooks, female    DOB: 1934-08-30, 77 y.o.   MRN: [id number]  HPI patient enters with onset this week of severe back pain and lower abdominal pain. She is 6 months status post surgery for spinal stenosis and lumbar disc disease. She has a history of urinary tract infections and incomplete bladder emptying. She is having severe discomfort in the lower abdomen. She denies fever or vomiting.    Review of Systems     Objective:   Physical Exam patient appears to be uncomfortable just sitting on the table. Her lungs were clear. Abdominal exam reveals bowel sounds present. There is exquisite tenderness in the lower abdomen suprapubic and both lower quadrants.  Results for orders placed in visit on 08/10/12  POCT URINALYSIS DIPSTICK      Component Value Range   Color, UA yellow     Clarity, UA clear     Glucose, UA neg     Bilirubin, UA neg     Ketones, UA neg     Spec Grav, UA 1.020     Blood, UA neg     pH, UA 5.5     Protein, UA trace     Urobilinogen, UA 0.2     Nitrite, UA positive     Leukocytes, UA small (1+)    POCT UA - MICROSCOPIC ONLY      Component Value Range   WBC, Ur, HPF, POC 2-8     RBC, urine, microscopic neg     Bacteria, U Microscopic 2+     Mucus, UA neg     Epithelial cells, urine per micros 0-1     Crystals, Ur, HPF, POC neg     Casts, Ur, LPF, POC neg     Yeast, UA neg     Results for orders placed in visit on 08/10/12  POCT URINALYSIS DIPSTICK      Component Value Range   Color, UA yellow     Clarity, UA clear     Glucose, UA neg     Bilirubin, UA neg     Ketones, UA neg     Spec Grav, UA 1.020     Blood, UA neg     pH, UA 5.5     Protein, UA trace     Urobilinogen, UA 0.2     Nitrite, UA positive     Leukocytes, UA small (1+)    POCT UA - MICROSCOPIC ONLY      Component Value Range   WBC, Ur, HPF, POC 2-8     RBC, urine, microscopic neg     Bacteria, U Microscopic 2+     Mucus, UA neg     Epithelial  cells, urine per micros 0-1     Crystals, Ur, HPF, POC neg     Casts, Ur, LPF, POC neg     Yeast, UA neg    POCT CBC      Component Value Range   WBC 6.2  4.6 - 10.2 K/uL   Lymph, poc 1.7  0.6 - 3.4   POC LYMPH PERCENT 26.9  10 - 50 %L   MID (cbc) 0.6  0 - 0.9   POC MID % 9.5  0 - 12 %M   POC Granulocyte 3.9  2 - 6.9   Granulocyte percent 63.6  37 - 80 %G   RBC 4.71  4.04 - 5.48 M/uL   Hemoglobin 14.0  12.2 - 16.2 g/dL  HCT, POC 43.0  37.7 - 47.9 %   MCV 91.4  80 - 97 fL   MCH, POC 29.7  27 - 31.2 pg   MCHC 32.6  31.8 - 35.4 g/dL   RDW, POC 16.1     Platelet Count, POC 230  142 - 424 K/uL   MPV 7.7  0 - 99.8 fL       Assessment & Plan:  Patient presents with pyuria , and dysuria but with significant lower abdominal pain. I did have  concerns about some type of bowel pathology such as diverticulitis. We'll check a CBC , baseline urine culture was done and she  was sent to the emergency room for further evaluation and possible imaging. She does have 2 to 8  white cells 2+ bacteria and positive nitrite but she has too much abdominal pain to explain this by a urinary tract infection.

## 2012-08-12 LAB — URINE CULTURE: Colony Count: >=100000

## 2012-08-13 NOTE — ED Notes (Signed)
+   urine  Patient treated appropriately -sensitive to same-chart appended per protocol MD.  

## 2012-09-03 DIAGNOSIS — R32 Unspecified urinary incontinence: Secondary | ICD-10-CM | POA: Diagnosis not present

## 2012-09-03 DIAGNOSIS — E1159 Type 2 diabetes mellitus with other circulatory complications: Secondary | ICD-10-CM | POA: Diagnosis not present

## 2012-09-03 DIAGNOSIS — N390 Urinary tract infection, site not specified: Secondary | ICD-10-CM | POA: Diagnosis not present

## 2012-09-03 DIAGNOSIS — D649 Anemia, unspecified: Secondary | ICD-10-CM | POA: Diagnosis not present

## 2012-09-03 DIAGNOSIS — IMO0002 Reserved for concepts with insufficient information to code with codable children: Secondary | ICD-10-CM | POA: Diagnosis not present

## 2012-09-24 DIAGNOSIS — E785 Hyperlipidemia, unspecified: Secondary | ICD-10-CM | POA: Diagnosis not present

## 2012-10-13 DIAGNOSIS — R5381 Other malaise: Secondary | ICD-10-CM | POA: Diagnosis not present

## 2012-10-13 DIAGNOSIS — I10 Essential (primary) hypertension: Secondary | ICD-10-CM | POA: Diagnosis not present

## 2012-10-13 DIAGNOSIS — I2510 Atherosclerotic heart disease of native coronary artery without angina pectoris: Secondary | ICD-10-CM | POA: Diagnosis not present

## 2012-10-13 DIAGNOSIS — I5030 Unspecified diastolic (congestive) heart failure: Secondary | ICD-10-CM | POA: Diagnosis not present

## 2012-10-13 DIAGNOSIS — R5383 Other fatigue: Secondary | ICD-10-CM | POA: Diagnosis not present

## 2012-10-13 DIAGNOSIS — R0602 Shortness of breath: Secondary | ICD-10-CM | POA: Diagnosis not present

## 2012-10-29 DIAGNOSIS — I5030 Unspecified diastolic (congestive) heart failure: Secondary | ICD-10-CM | POA: Diagnosis not present

## 2012-10-29 DIAGNOSIS — R0602 Shortness of breath: Secondary | ICD-10-CM | POA: Diagnosis not present

## 2012-10-29 DIAGNOSIS — I10 Essential (primary) hypertension: Secondary | ICD-10-CM | POA: Diagnosis not present

## 2012-10-29 DIAGNOSIS — I2510 Atherosclerotic heart disease of native coronary artery without angina pectoris: Secondary | ICD-10-CM | POA: Diagnosis not present

## 2012-11-18 ENCOUNTER — Encounter: Payer: Self-pay | Admitting: Internal Medicine

## 2012-11-18 ENCOUNTER — Ambulatory Visit (INDEPENDENT_AMBULATORY_CARE_PROVIDER_SITE_OTHER): Payer: Medicare Other | Admitting: Internal Medicine

## 2012-11-18 VITALS — BP 122/68 | HR 57 | Temp 98.1°F | Resp 18 | Wt 193.0 lb

## 2012-11-18 DIAGNOSIS — I2510 Atherosclerotic heart disease of native coronary artery without angina pectoris: Secondary | ICD-10-CM | POA: Diagnosis not present

## 2012-11-18 DIAGNOSIS — E119 Type 2 diabetes mellitus without complications: Secondary | ICD-10-CM | POA: Insufficient documentation

## 2012-11-18 DIAGNOSIS — F329 Major depressive disorder, single episode, unspecified: Secondary | ICD-10-CM

## 2012-11-18 DIAGNOSIS — E785 Hyperlipidemia, unspecified: Secondary | ICD-10-CM

## 2012-11-18 DIAGNOSIS — I10 Essential (primary) hypertension: Secondary | ICD-10-CM

## 2012-11-18 DIAGNOSIS — I5032 Chronic diastolic (congestive) heart failure: Secondary | ICD-10-CM | POA: Insufficient documentation

## 2012-11-18 DIAGNOSIS — E1169 Type 2 diabetes mellitus with other specified complication: Secondary | ICD-10-CM | POA: Diagnosis not present

## 2012-11-18 DIAGNOSIS — I509 Heart failure, unspecified: Secondary | ICD-10-CM

## 2012-11-18 DIAGNOSIS — M48062 Spinal stenosis, lumbar region with neurogenic claudication: Secondary | ICD-10-CM

## 2012-11-18 DIAGNOSIS — F411 Generalized anxiety disorder: Secondary | ICD-10-CM | POA: Insufficient documentation

## 2012-11-18 DIAGNOSIS — F32A Depression, unspecified: Secondary | ICD-10-CM | POA: Insufficient documentation

## 2012-11-18 NOTE — Assessment & Plan Note (Signed)
Remains chest pain free at present. Continue current regimen with prn s/l ntg and asa, b blocker, statin, ACEI

## 2012-11-18 NOTE — Assessment & Plan Note (Signed)
S/p surgery in past and has disc protrusion in lumbar region. On prn pain medication but not taking it as prescribed. Encouraged to take it as prescribed to further evaluate need for adjustment in regimen

## 2012-11-18 NOTE — Patient Instructions (Signed)
Take your pain medication as prescribed. Bring all your medications to the office on your next visit

## 2012-11-18 NOTE — Assessment & Plan Note (Signed)
Stable this visit. Will continue citalopram. Has good support system at present with her son and daughter in law. Continue prn clonazepam- requiring it once a day for now

## 2012-11-18 NOTE — Assessment & Plan Note (Signed)
Remains asymptomatic at present. Continue b blocker, ACEI, aldactone, hydralazine. Monitor weight and continue salt restriction in diet. To follow with cardiology

## 2012-11-18 NOTE — Assessment & Plan Note (Addendum)
Under control in the office. Continue current regimen of metoprolol, hydralazine, lisinopril-hctz. Check cmp today. Diet and walking for exercise encouraged

## 2012-11-18 NOTE — Assessment & Plan Note (Signed)
Recheck a1c today and if < 6.5 will decrease metformin to once a day.
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
## 2012-11-19 ENCOUNTER — Other Ambulatory Visit: Payer: Self-pay | Admitting: Internal Medicine

## 2012-11-19 ENCOUNTER — Encounter: Payer: Self-pay | Admitting: *Deleted

## 2012-11-19 LAB — COMPREHENSIVE METABOLIC PANEL
Albumin/Globulin Ratio: 1.8 (ref 1.1–2.5)
Albumin: 4.1 g/dL (ref 3.5–4.8)
Calcium: 10.2 mg/dL (ref 8.6–10.2)
Creatinine, Ser: 1.09 mg/dL — ABNORMAL HIGH (ref 0.57–1.00)
GFR calc Af Amer: 57 mL/min/{1.73_m2} — ABNORMAL LOW (ref >59)
GFR calc non Af Amer: 49 mL/min/{1.73_m2} — ABNORMAL LOW (ref >59)
Globulin, Total: 2.3 g/dL (ref 1.5–4.5)
Glucose: 110 mg/dL — ABNORMAL HIGH (ref 65–99)
Potassium: 4.8 mmol/L (ref 3.5–5.2)
Total Bilirubin: 0.5 mg/dL (ref 0.0–1.2)
Total Protein: 6.4 g/dL (ref 6.0–8.5)

## 2012-11-19 LAB — HEMOGLOBIN A1C: Est. average glucose Bld gHb Est-mCnc: 126 mg/dL

## 2012-11-19 MED ORDER — OXYCODONE-ACETAMINOPHEN 5-325 MG PO TABS: ORAL_TABLET | ORAL | Status: DC

## 2012-11-20 ENCOUNTER — Other Ambulatory Visit: Payer: Self-pay | Admitting: *Deleted

## 2012-11-20 MED ORDER — METFORMIN HCL 500 MG PO TABS: ORAL_TABLET | ORAL | Status: DC

## 2012-11-28 DIAGNOSIS — I2510 Atherosclerotic heart disease of native coronary artery without angina pectoris: Secondary | ICD-10-CM | POA: Diagnosis not present

## 2012-11-28 DIAGNOSIS — I5030 Unspecified diastolic (congestive) heart failure: Secondary | ICD-10-CM | POA: Diagnosis not present

## 2012-11-28 DIAGNOSIS — R0602 Shortness of breath: Secondary | ICD-10-CM | POA: Diagnosis not present

## 2012-11-28 DIAGNOSIS — I10 Essential (primary) hypertension: Secondary | ICD-10-CM | POA: Diagnosis not present

## 2012-12-11 ENCOUNTER — Other Ambulatory Visit: Payer: Self-pay | Admitting: Internal Medicine

## 2012-12-29 ENCOUNTER — Other Ambulatory Visit: Payer: Self-pay | Admitting: Internal Medicine

## 2012-12-29 DIAGNOSIS — I10 Essential (primary) hypertension: Secondary | ICD-10-CM | POA: Diagnosis not present

## 2012-12-29 DIAGNOSIS — I2510 Atherosclerotic heart disease of native coronary artery without angina pectoris: Secondary | ICD-10-CM | POA: Diagnosis not present

## 2012-12-29 DIAGNOSIS — I5030 Unspecified diastolic (congestive) heart failure: Secondary | ICD-10-CM | POA: Diagnosis not present

## 2013-01-27 ENCOUNTER — Other Ambulatory Visit: Payer: Self-pay | Admitting: *Deleted

## 2013-01-27 MED ORDER — OXYBUTYNIN CHLORIDE ER 5 MG PO TB24: 5.0000 mg | ORAL_TABLET | Freq: Once | ORAL | Status: DC

## 2013-02-03 DIAGNOSIS — H04129 Dry eye syndrome of unspecified lacrimal gland: Secondary | ICD-10-CM | POA: Diagnosis not present

## 2013-02-03 DIAGNOSIS — E119 Type 2 diabetes mellitus without complications: Secondary | ICD-10-CM | POA: Diagnosis not present

## 2013-02-03 DIAGNOSIS — H40059 Ocular hypertension, unspecified eye: Secondary | ICD-10-CM | POA: Diagnosis not present

## 2013-02-03 DIAGNOSIS — H1045 Other chronic allergic conjunctivitis: Secondary | ICD-10-CM | POA: Diagnosis not present

## 2013-02-13 ENCOUNTER — Encounter: Payer: Self-pay | Admitting: *Deleted

## 2013-02-16 ENCOUNTER — Encounter: Payer: Self-pay | Admitting: *Deleted

## 2013-02-17 ENCOUNTER — Ambulatory Visit (INDEPENDENT_AMBULATORY_CARE_PROVIDER_SITE_OTHER): Payer: Medicare Other | Admitting: Internal Medicine

## 2013-02-17 ENCOUNTER — Encounter: Payer: Self-pay | Admitting: Internal Medicine

## 2013-02-17 VITALS — BP 150/86 | HR 57 | Temp 98.0°F | Resp 14 | Ht 59.0 in | Wt 192.6 lb

## 2013-02-17 DIAGNOSIS — E1159 Type 2 diabetes mellitus with other circulatory complications: Secondary | ICD-10-CM

## 2013-02-17 DIAGNOSIS — F411 Generalized anxiety disorder: Secondary | ICD-10-CM

## 2013-02-17 DIAGNOSIS — F3289 Other specified depressive episodes: Secondary | ICD-10-CM

## 2013-02-17 DIAGNOSIS — F329 Major depressive disorder, single episode, unspecified: Secondary | ICD-10-CM | POA: Diagnosis not present

## 2013-02-17 DIAGNOSIS — M48062 Spinal stenosis, lumbar region with neurogenic claudication: Secondary | ICD-10-CM | POA: Diagnosis not present

## 2013-02-17 DIAGNOSIS — E119 Type 2 diabetes mellitus without complications: Secondary | ICD-10-CM

## 2013-02-17 DIAGNOSIS — I5040 Unspecified combined systolic (congestive) and diastolic (congestive) heart failure: Secondary | ICD-10-CM

## 2013-02-17 DIAGNOSIS — E1169 Type 2 diabetes mellitus with other specified complication: Secondary | ICD-10-CM

## 2013-02-17 DIAGNOSIS — I10 Essential (primary) hypertension: Secondary | ICD-10-CM

## 2013-02-17 MED ORDER — LOSARTAN POTASSIUM-HCTZ 50-12.5 MG PO TABS: 1.0000 | ORAL_TABLET | Freq: Every day | ORAL | Status: DC

## 2013-02-17 MED ORDER — METFORMIN HCL 500 MG PO TABS: ORAL_TABLET | ORAL | Status: DC

## 2013-02-17 NOTE — Patient Instructions (Signed)
Please keep appointment with DR Julio Sicks on 02/26/13 at 1: 45 pm for your frequent falls and tingling in your legs with back pain.  Degenerative Arthritis You have osteoarthritis. This is the wear and tear arthritis that comes with aging. It is also called degenerative arthritis. This is common in people past middle age. It is caused by stress on the joints. The large weight bearing joints of the lower extremities are most often affected. The knees, hips, back, neck, and hands can become painful, swollen, and stiff. This is the most common type of arthritis. It comes on with age, carrying too much weight, or from an injury. Treatment includes resting the sore joint until the pain and swelling improve. Crutches or a walker may be needed for severe flares. Only take over-the-counter or prescription medicines for pain, discomfort, or fever as directed by your caregiver. Local heat therapy may improve motion. Cortisone shots into the joint are sometimes used to reduce pain and swelling during flares. Osteoarthritis is usually not crippling and progresses slowly. There are things you can do to decrease pain:  Avoid high impact activities.  Exercise regularly.  Low impact exercises such as walking, biking and swimming help to keep the muscles strong and keep normal joint function.  Stretching helps to keep your range of motion.  Lose weight if you are overweight. This reduces joint stress. In severe cases when you have pain at rest or increasing disability, joint surgery may be helpful. See your caregiver for follow-up treatment as recommended.  SEEK IMMEDIATE MEDICAL CARE IF:   You have severe joint pain.  Marked swelling and redness in your joint develops.  You develop a high fever. Document Released: 08/06/2005 Document Revised: 10/29/2011 Document Reviewed: 01/06/2007 [facility] Patient Information 2014 [place], [place].

## 2013-02-17 NOTE — Progress Notes (Signed)
Patient ID: Abigail Brooks, female   DOB: July 20, 1935, 78 y.o.   MRN: [id number]  Chief Complaint  Patient presents with  . Medical Managment of Chronic Issues   Code Status: living will  No Known Allergies  HPI:   78 y/o female patient here for follow up. Her arthritis in her arms and hips and knees have been acting up. She also has been having frequent falls. She had 2-3 falls last month. She is nervous about this. She feels like she is going to lose her balance and then fall down. Denies dizziness or lightheadedness. Denies any chest pain or SOB. Denies any diaphoresis and palpitations. Uses her cane all the time. Denies her knees locking or giving away Denies hitting her head or losing consciousness She is able to recall her incidents  Of note- she has history of lumbar spinal stenosis and underwent laminectomy last year in 2013  Anxiety- persists  Depression- taking celexa and this has been helpful.   Chronic Back pain with lumbar spinal stenosis- is on oxycodone apap 5-325 1-2 tab q6h prn and needing it three times a day but mostl 2 times a day and pain under better control with 1 tab a day at present. Seen Dr Julio Sicks from [facility] in past last in 12/13.   HTN- bp has been under better control. Slightly elevated in the office today. Has not taken her bp meds this am. Medication reviewed. No headache, chest pain, dyspnea  DM- on metformin 500 mg bid. cbg yesterday was 112 in am. Denies any dizziness. Compliant with her medications  CAD- follows with cardiology dr Katrinka Blazing. Remains chest pain free. Compliant with her medications. Will need to review recodrs from [facility] cardiology  CHF- stable. On b blocker, spironolactone, hydralazine and losartan-hctz and statin  Follows with her eye doctor Dr Dione Booze and cardiology Dr Katrinka Blazing. Last seen by cardiology 2-3 months back  Review of Systems:  Constitutional: Negative for fever and chills.  HENT: Negative for congestion.    Respiratory: Negative for cough and shortness of breath.   Cardiovascular: Negative for chest pain, palpitations and leg swelling.  Gastrointestinal: Negative for heartburn, abdominal pain and constipation.  Genitourinary: Negative for dysuria.  Skin: Negative for rash.  Neurological: Negative for dizziness, weakness and headaches.  Psychiatric/Behavioral: Negative for depression. The patient is nervous/anxious.    BP 150/86  Pulse 57  Temp(Src) 98 F (36.7 C) (Oral)  Resp 14  Ht 4\' 11"  (1.499 m)  Wt 192 lb 9.6 oz (87.363 kg)  BMI 38.88 kg/m2  Constitutional: She is oriented to person, place, and time. She appears well-developed and well-nourished.  obese  HENT:  Head: Normocephalic and atraumatic.   Mouth/Throat: Oropharynx is clear and moist.  Eyes: Conjunctivae are normal. Pupils are equal, round, and reactive to light.  Neck: Normal range of motion. Neck supple. No thyromegaly present.  Cardiovascular: Normal rate, regular rhythm and normal heart sounds.   Pulmonary/Chest: Effort normal and breath sounds normal.  Abdominal: Soft. Bowel sounds are normal.  Musculoskeletal: Normal range of motion. She exhibits no edema.  Gait steady, have a cane today  Lymphadenopathy:    She has no cervical adenopathy.  Neurological: She is alert and oriented to person, place, and time. Light touch, pin prick and vibration sensation intact Skin: Skin is warm and dry.  Psychiatric: She has a normal mood and affect.   Labs- CBC    Component Value Date/Time   WBC 6.6 08/10/2012 1255  WBC 6.2 08/10/2012 1151   RBC 4.77 08/10/2012 1255   RBC 4.71 08/10/2012 1151   HGB 14.3 08/10/2012 1255   HGB 14.0 08/10/2012 1151   HCT 40.8 08/10/2012 1255   HCT 43.0 08/10/2012 1151   PLT 200 08/10/2012 1255   MCV 85.5 08/10/2012 1255   MCV 91.4 08/10/2012 1151   MCH 30.0 08/10/2012 1255   MCH 29.7 08/10/2012 1151   MCHC 35.0 08/10/2012 1255   MCHC 32.6 08/10/2012 1151   RDW 13.0 08/10/2012  1255   LYMPHSABS 1.6 08/10/2012 1255   MONOABS 0.5 08/10/2012 1255   EOSABS 0.5 08/10/2012 1255   BASOSABS 0.0 08/10/2012 1255    CMP     Component Value Date/Time   NA 138 11/18/2012 0931   NA 140 08/10/2012 1255   K 4.8 11/18/2012 0931   CL 98 11/18/2012 0931   CO2 24 11/18/2012 0931   GLUCOSE 110* 11/18/2012 0931   GLUCOSE 108* 08/10/2012 1255   BUN 15 11/18/2012 0931   BUN 25* 08/10/2012 1255   CREATININE 1.09* 11/18/2012 0931   CALCIUM 10.2 11/18/2012 0931   PROT 6.4 11/18/2012 0931   PROT 6.9 08/10/2012 1255   ALBUMIN 3.7 08/10/2012 1255   AST 16 11/18/2012 0931   ALT 21 11/18/2012 0931   ALKPHOS 60 11/18/2012 0931   BILITOT 0.5 11/18/2012 0931   GFRNONAA 49* 11/18/2012 0931   GFRAA 57* 11/18/2012 0931   MMSE- 25/30  ASSESSMENT/PLAN-  Lumbar stenosis with neurogenic claudication S/p surgery in past and has disc protrusion in lumbar region. On prn pain medication. Given her frequent falls with normal cardiac exam, spoke with her neurosurgery clinic and will schedule her appointment for workup for this recent increase in fall- 02/26/13  Type II or unspecified type diabetes mellitus without mention of complication, not stated as uncontrolled Well controlled with a1c of 6. Will decrease metformin to 500 mg once a day. Monitor cbg. Given her obesity and cardiac issues along with HTN, she will benefit from this dose of metformin  Hypertension associated with diabetes Continue current regimen of metoprolol, hydralazine, lisinopril-hctz. Check cmp today. Diet and walking for exercise encouraged  CHF (congestive heart failure) Remains asymptomatic at present. Continue b blocker, ACEI, aldactone, hydralazine. Monitor weight and continue salt restriction in diet. To follow with cardiology. Requested office note from last visit for review  CAD (coronary artery disease) Remains chest pain free at present. Continue current regimen with prn s/l ntg and asa, b blocker, statin, ACEI  Depression Stable this  visit. Will continue citalopram. Has good support system at present with her son and daughter in law. Has mild cognitive impairment but able to drive and have pleasant conversations. Does not want any medications for now. Will readdress this further during her physical again

## 2013-02-17 NOTE — Progress Notes (Signed)
Failed Clock Drawing----given by Anjelique Makar Gilchrist,CMA 

## 2013-02-18 ENCOUNTER — Ambulatory Visit: Payer: Medicare Other | Admitting: Internal Medicine

## 2013-02-26 ENCOUNTER — Other Ambulatory Visit: Payer: Self-pay | Admitting: Neurosurgery

## 2013-02-26 ENCOUNTER — Other Ambulatory Visit: Payer: Self-pay | Admitting: Internal Medicine

## 2013-02-26 DIAGNOSIS — M48062 Spinal stenosis, lumbar region with neurogenic claudication: Secondary | ICD-10-CM | POA: Diagnosis not present

## 2013-02-26 DIAGNOSIS — M48061 Spinal stenosis, lumbar region without neurogenic claudication: Secondary | ICD-10-CM

## 2013-03-02 ENCOUNTER — Other Ambulatory Visit: Payer: Self-pay | Admitting: Internal Medicine

## 2013-03-09 ENCOUNTER — Ambulatory Visit
Admission: RE | Admit: 2013-03-09 | Discharge: 2013-03-09 | Disposition: A | Discharge status: Home / Self Care | Payer: Medicare Other | Source: Ambulatory Visit | Attending: Neurosurgery | Admitting: Neurosurgery

## 2013-03-09 DIAGNOSIS — M48061 Spinal stenosis, lumbar region without neurogenic claudication: Secondary | ICD-10-CM

## 2013-03-09 DIAGNOSIS — M5137 Other intervertebral disc degeneration, lumbosacral region: Secondary | ICD-10-CM | POA: Diagnosis not present

## 2013-03-09 DIAGNOSIS — M4310 Spondylolisthesis, site unspecified: Secondary | ICD-10-CM | POA: Diagnosis not present

## 2013-03-09 MED ORDER — GADOBENATE DIMEGLUMINE 529 MG/ML IV SOLN
18.0000 mL | Freq: Once | INTRAVENOUS | Status: AC | PRN
  Administered 2013-03-09: 18 mL via INTRAVENOUS

## 2013-03-12 DIAGNOSIS — M4310 Spondylolisthesis, site unspecified: Secondary | ICD-10-CM | POA: Diagnosis not present

## 2013-03-12 DIAGNOSIS — M48062 Spinal stenosis, lumbar region with neurogenic claudication: Secondary | ICD-10-CM | POA: Diagnosis not present

## 2013-03-18 ENCOUNTER — Other Ambulatory Visit: Payer: Self-pay | Admitting: Geriatric Medicine

## 2013-03-18 MED ORDER — CLONAZEPAM 0.5 MG PO TABS: 0.5000 mg | ORAL_TABLET | Freq: Three times a day (TID) | ORAL | Status: DC | PRN

## 2013-03-19 ENCOUNTER — Other Ambulatory Visit: Payer: Self-pay | Admitting: Geriatric Medicine

## 2013-03-19 MED ORDER — CLONAZEPAM 0.5 MG PO TABS: 0.5000 mg | ORAL_TABLET | Freq: Three times a day (TID) | ORAL | Status: DC | PRN

## 2013-04-07 ENCOUNTER — Other Ambulatory Visit: Payer: Self-pay | Admitting: Internal Medicine

## 2013-04-07 ENCOUNTER — Other Ambulatory Visit: Payer: Self-pay | Admitting: Nurse Practitioner

## 2013-04-09 ENCOUNTER — Other Ambulatory Visit: Payer: Self-pay | Admitting: Geriatric Medicine

## 2013-04-09 MED ORDER — OXYCODONE-ACETAMINOPHEN 5-325 MG PO TABS: ORAL_TABLET | ORAL | Status: DC

## 2013-04-22 ENCOUNTER — Other Ambulatory Visit: Payer: Self-pay

## 2013-04-22 MED ORDER — CLONAZEPAM 0.5 MG PO TABS: 0.5000 mg | ORAL_TABLET | Freq: Three times a day (TID) | ORAL | Status: DC | PRN

## 2013-05-02 ENCOUNTER — Other Ambulatory Visit: Payer: Self-pay | Admitting: Nurse Practitioner

## 2013-06-01 ENCOUNTER — Other Ambulatory Visit: Payer: Self-pay | Admitting: Internal Medicine

## 2013-06-08 ENCOUNTER — Other Ambulatory Visit: Payer: Medicare Other

## 2013-06-08 ENCOUNTER — Other Ambulatory Visit: Payer: Self-pay | Admitting: Internal Medicine

## 2013-06-08 ENCOUNTER — Other Ambulatory Visit: Payer: Self-pay | Admitting: Nurse Practitioner

## 2013-06-08 ENCOUNTER — Other Ambulatory Visit: Payer: Self-pay | Admitting: *Deleted

## 2013-06-08 DIAGNOSIS — E785 Hyperlipidemia, unspecified: Secondary | ICD-10-CM

## 2013-06-08 DIAGNOSIS — E1159 Type 2 diabetes mellitus with other circulatory complications: Secondary | ICD-10-CM

## 2013-06-08 DIAGNOSIS — I5040 Unspecified combined systolic (congestive) and diastolic (congestive) heart failure: Secondary | ICD-10-CM

## 2013-06-08 DIAGNOSIS — E1169 Type 2 diabetes mellitus with other specified complication: Secondary | ICD-10-CM | POA: Diagnosis not present

## 2013-06-08 DIAGNOSIS — I10 Essential (primary) hypertension: Secondary | ICD-10-CM | POA: Diagnosis not present

## 2013-06-08 DIAGNOSIS — E119 Type 2 diabetes mellitus without complications: Secondary | ICD-10-CM | POA: Diagnosis not present

## 2013-06-08 DIAGNOSIS — F329 Major depressive disorder, single episode, unspecified: Secondary | ICD-10-CM | POA: Diagnosis not present

## 2013-06-08 MED ORDER — OXYCODONE-ACETAMINOPHEN 5-325 MG PO TABS: ORAL_TABLET | ORAL | Status: DC

## 2013-06-09 LAB — CBC WITH DIFFERENTIAL/PLATELET
Basos: 1 %
Eos: 5 %
Eos: 5 %
Eosinophils Absolute: 0.4 10*3/uL (ref 0.0–0.4)
HCT: 40.2 % (ref 34.0–46.6)
Hemoglobin: 13.4 g/dL (ref 11.1–15.9)
Hemoglobin: 13.5 g/dL (ref 11.1–15.9)
Immature Grans (Abs): 0 10*3/uL (ref 0.0–0.1)
Lymphocytes Absolute: 1.4 10*3/uL (ref 0.7–3.1)
Lymphs: 17 %
MCH: 30.3 pg (ref 26.6–33.0)
MCV: 91 fL (ref 79–97)
MCV: 92 fL (ref 79–97)
Monocytes Absolute: 0.4 10*3/uL (ref 0.1–0.9)
Monocytes Absolute: 0.5 10*3/uL (ref 0.1–0.9)
Monocytes: 5 %
Neutrophils Absolute: 5.6 10*3/uL (ref 1.4–7.0)
Neutrophils Absolute: 5.6 10*3/uL (ref 1.4–7.0)
Neutrophils Relative %: 71 %
RBC: 4.46 x10E6/uL (ref 3.77–5.28)
WBC: 7.9 10*3/uL (ref 3.4–10.8)
WBC: 7.9 10*3/uL (ref 3.4–10.8)

## 2013-06-09 LAB — COMPREHENSIVE METABOLIC PANEL
Albumin/Globulin Ratio: 2.2 (ref 1.1–2.5)
Albumin: 4.3 g/dL (ref 3.5–4.8)
Alkaline Phosphatase: 58 IU/L (ref 39–117)
BUN/Creatinine Ratio: 18 (ref 11–26)
BUN: 14 mg/dL (ref 8–27)
Creatinine, Ser: 0.8 mg/dL (ref 0.57–1.00)
GFR calc Af Amer: 83 mL/min/{1.73_m2} (ref >59)
GFR calc non Af Amer: 72 mL/min/{1.73_m2} (ref >59)
Globulin, Total: 2 g/dL (ref 1.5–4.5)
Sodium: 144 mmol/L (ref 134–144)
Total Bilirubin: 0.5 mg/dL (ref 0.0–1.2)

## 2013-06-09 LAB — LIPID PANEL
Chol/HDL Ratio: 4.3 ratio units (ref 0.0–4.4)
Cholesterol, Total: 205 mg/dL — ABNORMAL HIGH (ref 100–199)
LDL Calculated: 107 mg/dL — ABNORMAL HIGH (ref 0–99)
Triglycerides: 233 mg/dL — ABNORMAL HIGH (ref 0–149)
VLDL Cholesterol Cal: 50 mg/dL — ABNORMAL HIGH (ref 5–40)

## 2013-06-10 ENCOUNTER — Encounter: Payer: Medicare Other | Admitting: Internal Medicine

## 2013-06-10 DIAGNOSIS — M4310 Spondylolisthesis, site unspecified: Secondary | ICD-10-CM | POA: Diagnosis not present

## 2013-06-10 DIAGNOSIS — M48062 Spinal stenosis, lumbar region with neurogenic claudication: Secondary | ICD-10-CM | POA: Diagnosis not present

## 2013-06-19 ENCOUNTER — Other Ambulatory Visit: Payer: Self-pay | Admitting: Internal Medicine

## 2013-06-23 ENCOUNTER — Encounter: Payer: Self-pay | Admitting: Internal Medicine

## 2013-06-23 ENCOUNTER — Ambulatory Visit (INDEPENDENT_AMBULATORY_CARE_PROVIDER_SITE_OTHER): Payer: Medicare Other | Admitting: Internal Medicine

## 2013-06-23 VITALS — BP 150/86 | HR 59 | Temp 98.0°F | Ht 59.0 in | Wt 197.2 lb

## 2013-06-23 DIAGNOSIS — E785 Hyperlipidemia, unspecified: Secondary | ICD-10-CM

## 2013-06-23 DIAGNOSIS — F329 Major depressive disorder, single episode, unspecified: Secondary | ICD-10-CM | POA: Diagnosis not present

## 2013-06-23 DIAGNOSIS — E119 Type 2 diabetes mellitus without complications: Secondary | ICD-10-CM | POA: Diagnosis not present

## 2013-06-23 DIAGNOSIS — M899 Disorder of bone, unspecified: Secondary | ICD-10-CM

## 2013-06-23 DIAGNOSIS — Z23 Encounter for immunization: Secondary | ICD-10-CM | POA: Diagnosis not present

## 2013-06-23 DIAGNOSIS — M48062 Spinal stenosis, lumbar region with neurogenic claudication: Secondary | ICD-10-CM

## 2013-06-23 DIAGNOSIS — F411 Generalized anxiety disorder: Secondary | ICD-10-CM

## 2013-06-23 DIAGNOSIS — K5900 Constipation, unspecified: Secondary | ICD-10-CM

## 2013-06-23 DIAGNOSIS — E1159 Type 2 diabetes mellitus with other circulatory complications: Secondary | ICD-10-CM

## 2013-06-23 DIAGNOSIS — M949 Disorder of cartilage, unspecified: Secondary | ICD-10-CM

## 2013-06-23 DIAGNOSIS — I509 Heart failure, unspecified: Secondary | ICD-10-CM

## 2013-06-23 DIAGNOSIS — Z0000 Encounter for general adult medical examination without abnormal findings: Secondary | ICD-10-CM

## 2013-06-23 DIAGNOSIS — I10 Essential (primary) hypertension: Secondary | ICD-10-CM

## 2013-06-23 DIAGNOSIS — E1169 Type 2 diabetes mellitus with other specified complication: Secondary | ICD-10-CM

## 2013-06-23 MED ORDER — POLYETHYLENE GLYCOL 3350 17 GM/SCOOP PO POWD: 17.0000 g | Freq: Every day | ORAL | Status: DC | PRN

## 2013-06-23 MED ORDER — METFORMIN HCL 500 MG PO TABS: ORAL_TABLET | ORAL | Status: DC

## 2013-06-23 MED ORDER — SOLIFENACIN SUCCINATE 10 MG PO TABS: 10.0000 mg | ORAL_TABLET | Freq: Every day | ORAL | Status: DC

## 2013-06-23 NOTE — Patient Instructions (Signed)
Stop taking oxybuynin  Change metformin from twice a day to once a day only  Start taking new medication  miralax-- for constipation solifenacin-- for your urine problem

## 2013-06-23 NOTE — Progress Notes (Signed)
Patient ID: Abigail Brooks, female   DOB: [birth year], 78 y.o.   MRN: [id number]  Chief Complaint  Patient presents with  . Annual Exam    physical with labs printed   Allergies  Allergen Reactions  . Enalapril Swelling    Facial   Code Status: living will  HPI:   78 y/o female patient is here for her annual visit. She denies any concern this visit. She follows with her cardiologist. She is independent with her ADLs and lives alone. She does get anxious easily and her anxiety medication has helped her. She is complaint with her medications No recent mammogram or dexa scan Has not taken her flu vaccine No falls reported No new skin concerns Mood has been stable  Review of Systems  Constitutional: Negative for fever, chills, weight loss, malaise/fatigue and diaphoresis.  HENT: Negative for congestion, hearing loss, nosebleeds and sore throat.   Eyes: Negative for blurred vision, double vision, photophobia, pain, discharge and redness.  Respiratory: Negative for cough, shortness of breath and stridor.   Cardiovascular: Negative for chest pain, palpitations, orthopnea, claudication, leg swelling and PND.  Gastrointestinal: Positive for constipation. Negative for heartburn, nausea, vomiting, abdominal pain and diarrhea.  Genitourinary: Positive for urgency and frequency. Negative for dysuria and hematuria.  Musculoskeletal: Positive for back pain and joint pain. Negative for falls, myalgias and neck pain.  Skin: Negative for itching and rash.  Neurological: Negative for dizziness, tingling, seizures, loss of consciousness, weakness and headaches.  Psychiatric/Behavioral: Negative for depression and memory loss. The patient is nervous/anxious.    Past Medical History  Diagnosis Date  . Coronary artery disease     stents x 2, sees Dr. Halina [facility] practice  . Congestive heart failure     2012  . Myocardial infarction     "unsure of when"  . Hypertension   . Diabetes mellitus   .  Shortness of breath   . Anemia   . Blood transfusion     when had hysterectomy  . Hypothyroidism     has had hx of treatment  . Urinary frequency   . Urinary tract infection     hx of  . GERD (gastroesophageal reflux disease)   . H/O hiatal hernia   . Arthritis   . Depression   . Spinal stenosis     with bone spurs and ruptured disc  . Cataract   . Blood transfusion without reported diagnosis   . Complications affecting other specified body systems, hypertension   . Other malaise and fatigue   . Dysuria   . Unspecified vitamin D deficiency   . Anxiety state, unspecified   . Other B-complex deficiencies   . Abnormality of gait   . Other and unspecified hyperlipidemia    Past Surgical History  Procedure Laterality Date  . Cataract extraction w/ intraocular lens  implant, bilateral    . Bladder suspension    . Coronary      stents x 2, stent implant placed9/21/06  . Back surgery      ruptured disc, lumbar area  . Appendectomy    . Cardiac catheterization    . Lumbar laminectomy/decompression microdiscectomy  12/21/2011    Procedure: LUMBAR LAMINECTOMY/DECOMPRESSION MICRODISCECTOMY 1 LEVEL;  Surgeon: Temple Pacini, MD;  Location: [facility];  Service: Neurosurgery;  Laterality: Bilateral;  Lumbar four-five decompressive lumbar laminectomy; Right Lumbar four-five microdiscectomy  . Eye surgery    . Spine surgery    . Abdominal hysterectomy  1986  Medication reviewed. See MAR  Physical exam  BP 150/86  Pulse 59  Temp(Src) 98 F (36.7 C) (Oral)  Ht 4\' 11"  (1.499 m)  Wt 197 lb 3.2 oz (89.449 kg)  BMI 39.81 kg/m2  SpO2 99%  General- elderly female in no acute distress, obese Head- atraumatic, normocephalic Eyes- PERRLA, EOMI, no pallor, no icterus, no discharge Neck- no lymphadenopathy, no thyromegaly, no jugular vein distension, no carotid bruit Ears- left ear normal tympanic membrane and normal external ear canal , right ear normal tympanic membrane and normal  external ear canal Chest- no chest wall deformities, no chest wall tenderness Breast- no masses, no palpable lumps, large and pendulous breasts, normal nipple and areola exam, no axillary lymphadenopathy Cardiovascular- normal s1,s2, no murmurs/ rubs/ gallops Respiratory- bilateral clear to auscultation, no wheeze, no rhonchi, no crackles Abdomen- bowel sounds present, soft, non tender, no organomegaly, no abdominal bruits, no guarding or rigidity, no CVA tenderness Pelvic exam- refused Rectal exam- external hemorrhoid noted, FOBT negative, normal sphincter tone Musculoskeletal- able to move all 4 extremities, no spinal and paraspinal tenderness, steady gait, no use of assistive device, ROM limited with body habitus Neurological- no focal deficit, normal reflexes, normal muscle strength, normal sensation to fine touch and vibration Psychiatry- alert and oriented to person, place and time, normal mood and affect  Labs- CBC    Component Value Date/Time   WBC 7.9 06/08/2013 0853   WBC 7.9 06/08/2013 0853   WBC 6.6 08/10/2012 1255   WBC 6.2 08/10/2012 1151   RBC 4.46 06/08/2013 0853   RBC 4.40 06/08/2013 0853   RBC 4.77 08/10/2012 1255   RBC 4.71 08/10/2012 1151   HGB 13.5 06/08/2013 0853   HGB 13.4 06/08/2013 0853   HGB 14.0 08/10/2012 1151   HCT 40.8 06/08/2013 0853   HCT 40.2 06/08/2013 0853   HCT 43.0 08/10/2012 1151   PLT 200 08/10/2012 1255   MCV 92 06/08/2013 0853   MCV 91 06/08/2013 0853   MCV 91.4 08/10/2012 1151   MCH 30.3 06/08/2013 0853   MCH 30.5 06/08/2013 0853   MCH 30.0 08/10/2012 1255   MCH 29.7 08/10/2012 1151   MCHC 33.1 06/08/2013 0853   MCHC 33.3 06/08/2013 0853   MCHC 35.0 08/10/2012 1255   MCHC 32.6 08/10/2012 1151   RDW 14.3 06/08/2013 0853   RDW 14.3 06/08/2013 0853   RDW 13.0 08/10/2012 1255   LYMPHSABS 1.4 06/08/2013 0853   LYMPHSABS 1.4 06/08/2013 0853   LYMPHSABS 1.6 08/10/2012 1255   MONOABS 0.5 08/10/2012 1255   EOSABS 0.4 06/08/2013 0853    EOSABS 0.4 06/08/2013 0853   EOSABS 0.5 08/10/2012 1255   BASOSABS 0.0 06/08/2013 0853   BASOSABS 0.0 06/08/2013 0853   BASOSABS 0.0 08/10/2012 1255    CMP     Component Value Date/Time   NA 144 06/08/2013 0853   NA 140 08/10/2012 1255   K 3.9 06/08/2013 0853   CL 99 06/08/2013 0853   CO2 30* 06/08/2013 0853   GLUCOSE 144* 06/08/2013 0853   GLUCOSE 108* 08/10/2012 1255   BUN 14 06/08/2013 0853   BUN 25* 08/10/2012 1255   CREATININE 0.80 06/08/2013 0853   CALCIUM 9.5 06/08/2013 0853   PROT 6.3 06/08/2013 0853   PROT 6.9 08/10/2012 1255   ALBUMIN 3.7 08/10/2012 1255   AST 11 06/08/2013 0853   ALT 14 06/08/2013 0853   ALKPHOS 58 06/08/2013 0853   BILITOT 0.5 06/08/2013 0853   GFRNONAA 72 06/08/2013 0853   GFRAA 83  06/08/2013 0853   . Lipid Panel     Component Value Date/Time   TRIG 248* 06/08/2013 0853   TRIG 233* 06/08/2013 0853   HDL 50 06/08/2013 0853   HDL 47 06/08/2013 0853   CHOLHDL 4.1 06/08/2013 0853   CHOLHDL 4.3 06/08/2013 0853   LDLCALC 105* 06/08/2013 0853   LDLCALC 107* 06/08/2013 0853   a1c 5.9  Assessment/plan  Type II or unspecified type diabetes mellitus without mention of complication, not stated as uncontrolled Recent a1c 5.9. Will decrease her metformin to 500 mg once a day for now and monitor  Depression Stable this visit. Will continue citalopram. Has good support system at present with her son and daughter in law. Continue prn clonazepam  Lumbar stenosis with neurogenic claudication S/p surgery in past and has disc protrusion in lumbar region. is on oxycodone apap 5-325 q6h prn 1-2 tab and this has been helping her pain   Hypertension associated with diabetes Under control in the office. Continue current regimen of metoprolol, hydralazine, losartan-hctz. Diet and walking for exercise encouraged. Monitor bmp. continue aspirin  OAB With her oxybutynin not helping with the symptoms, will discontinue this and have her on vesicare 10 mg  daily for now. Reassess in 4 weeks  Constipation Continue colace and will add miralax daily as needed for now. No blood in stool or melena reported. External hemorrhoids are not tender. Preventing constipation is important here. Encouraged fiber and fluid intake  hyperlipidemia Continue simvastatin and fish oil supplement, monitor lipid panel periodically  CHF (congestive heart failure) Remains asymptomatic at present. Continue b blocker, ACEI, aldactone, hydralazine. Monitor weight and continue salt restriction in diet. To follow with cardiology dr Katrinka Blazing  CAD (coronary artery disease) Remains chest pain free at present. Continue current regimen with prn s/l ntg and asa, b blocker, statin, ACEI  Cartilage disorder Continue calcium and vitamin d supplement, fall precautions   routine exam Scheduled mammogram and dexa scan, fobt negative, reviewed labs. uptodate on pneumococcal and tdap. Will provide influenza vaccine today

## 2013-06-24 DIAGNOSIS — K5900 Constipation, unspecified: Secondary | ICD-10-CM | POA: Insufficient documentation

## 2013-06-24 DIAGNOSIS — Z0000 Encounter for general adult medical examination without abnormal findings: Secondary | ICD-10-CM | POA: Insufficient documentation

## 2013-06-24 DIAGNOSIS — M949 Disorder of cartilage, unspecified: Secondary | ICD-10-CM | POA: Insufficient documentation

## 2013-06-26 ENCOUNTER — Encounter: Payer: Self-pay | Admitting: Internal Medicine

## 2013-07-01 ENCOUNTER — Ambulatory Visit (INDEPENDENT_AMBULATORY_CARE_PROVIDER_SITE_OTHER): Payer: Medicare Other | Admitting: Interventional Cardiology

## 2013-07-01 ENCOUNTER — Encounter: Payer: Self-pay | Admitting: Interventional Cardiology

## 2013-07-01 VITALS — BP 138/88 | HR 53 | Ht 59.0 in | Wt 197.0 lb

## 2013-07-01 DIAGNOSIS — I509 Heart failure, unspecified: Secondary | ICD-10-CM

## 2013-07-01 DIAGNOSIS — E1169 Type 2 diabetes mellitus with other specified complication: Secondary | ICD-10-CM

## 2013-07-01 DIAGNOSIS — I5032 Chronic diastolic (congestive) heart failure: Secondary | ICD-10-CM | POA: Diagnosis not present

## 2013-07-01 DIAGNOSIS — I10 Essential (primary) hypertension: Secondary | ICD-10-CM

## 2013-07-01 DIAGNOSIS — R55 Syncope and collapse: Secondary | ICD-10-CM | POA: Diagnosis not present

## 2013-07-01 DIAGNOSIS — I2510 Atherosclerotic heart disease of native coronary artery without angina pectoris: Secondary | ICD-10-CM | POA: Diagnosis not present

## 2013-07-01 DIAGNOSIS — E1159 Type 2 diabetes mellitus with other circulatory complications: Secondary | ICD-10-CM

## 2013-07-01 NOTE — Patient Instructions (Signed)
Your physician recommends that you continue on your current medications as directed. Please refer to the Current Medication list given to you today.   Your physician has recommended that you wear a holter monitor. Holter monitors are medical devices that record the heart's electrical activity. Doctors most often use these monitors to diagnose arrhythmias. Arrhythmias are problems with the speed or rhythm of the heartbeat. The monitor is a small, portable device. You can wear one while you do your normal daily activities. This is usually used to diagnose what is causing palpitations/syncope (passing out).   Your physician wants you to follow-up in: 1 year You will receive a reminder letter in the mail two months in advance. If you don't receive a letter, please call our office to schedule the follow-up appointment.  

## 2013-07-01 NOTE — Progress Notes (Signed)
Patient ID: Abigail Brooks, female   DOB: 11-30-1934, 78 y.o.   MRN: [id number]    [address] [place], [place]  [address] Phone: [phone] Fax:  [phone]  Date:  07/01/2013   ID:  Abigail Brooks, DOB March 25, 1935, MRN [id number]  PCP:  Oneal Grout, MD   ASSESSMENT:  1. Chronic coronary artery disease, with stable angina 2. Chronic diastolic heart failure, stable on medical therapy 3. Hypertension, controlled 4. Syncope with head trauma  PLAN:  1. 48 hour Holter monitor 2. Call if a recurrence of syncope 3. Followup will be dependent upon findings of monitor. We may have to adjust the beta blocker dose downward. 4. I cautioned that these falls may threaten her independence   SUBJECTIVE: Abigail Brooks is a 78 y.o. female who complains of several episodes of falls recently. 2 days ago she fell after becoming entangled and the hose pipe. A month ago she was going through her door and believes she blacked out and fell into a wall at home putting a hole in the sheet rock. She believes she blacked out on that occasion. No prior episodes of pain. She denies angina. No dyspnea. No peripheral edema. She has been taking the medications as listed. She lives alone. She has been brought to the office today by a friend.   Wt Readings from Last 3 Encounters:  07/01/13 197 lb (89.359 kg)  06/23/13 197 lb 3.2 oz (89.449 kg)  02/17/13 192 lb 9.6 oz (87.363 kg)     Past Medical History  Diagnosis Date  . Coronary artery disease     stents x 2, sees Dr. Halina [facility] practice  . Congestive heart failure     2012  . Myocardial infarction     "unsure of when"  . Hypertension   . Diabetes mellitus   . Shortness of breath   . Anemia   . Blood transfusion     when had hysterectomy  . Hypothyroidism     has had hx of treatment  . Urinary frequency   . Urinary tract infection     hx of  . GERD (gastroesophageal reflux disease)   . H/O hiatal hernia   .  Arthritis   . Depression   . Spinal stenosis     with bone spurs and ruptured disc  . Cataract   . Blood transfusion without reported diagnosis   . Complications affecting other specified body systems, hypertension   . Other malaise and fatigue   . Dysuria   . Unspecified vitamin D deficiency   . Anxiety state, unspecified   . Other B-complex deficiencies   . Abnormality of gait   . Other and unspecified hyperlipidemia     Current Outpatient Prescriptions  Medication Sig Dispense Refill  . aspirin EC 325 MG tablet Take 325 mg by mouth daily. Take 1 tablet daily.      . Calcium Carbonate-Vitamin D (CALCIUM + D PO) Take 1 tablet by mouth daily.      . cholecalciferol (VITAMIN D) 1000 UNITS tablet Take 2,000 Units by mouth daily.      . citalopram (CELEXA) 40 MG tablet Take one tablet by mouth one time daily  for depression  30 tablet  3  . clonazePAM (KLONOPIN) 0.5 MG tablet TAKE ONE TABLET BY MOUTH THREE TIMES A DAY AS NEEDED FOR ANXIETY   90 tablet  0  . docusate sodium (COLACE) 100 MG capsule Take 200 mg by mouth  2 (two) times daily as needed. For stool softener.      . ferrous sulfate 324 (65 FE) MG TBEC Take one tablet by mouth one time daily for anemia.  30 tablet  1  . ferrous sulfate 325 (65 FE) MG tablet TAKE 1 TABLET BY MOUTH EVERY DAY FOR ANEMIA  30 tablet  3  . fish oil-omega-3 fatty acids 1000 MG capsule Take 1 g by mouth daily. Take 1 tablet by mouth with meals.      . hydrALAZINE (APRESOLINE) 25 MG tablet TAKE 1 TABLET 3 TIMES A DAY  90 tablet  5  . losartan-hydrochlorothiazide (HYZAAR) 50-12.5 MG per tablet Take 1 tablet by mouth daily.  90 tablet  3  . metFORMIN (GLUCOPHAGE) 500 MG tablet TAKE ONE TABLET BY MOUTH  DAILY  30 tablet  3  . metoprolol (LOPRESSOR) 100 MG tablet TAKE ONE TABLET BY MOUTH EVERY MORNING and every evening for blood pressure  30 tablet  3  . nitroGLYCERIN (NITROSTAT) 0.4 MG SL tablet Place 0.4 mg under the tongue every 5 (five) minutes as needed.  Take 1 tablet  SL at onset of chest pain,repeat in 5 minutes maximum, 3 tablets in 15 minutes.      Marland Kitchen oxyCODONE-acetaminophen (PERCOCET/ROXICET) 5-325 MG per tablet Take one tablet by mouth every 6 hours as needed for pain; Take two tablets by mouth every 6 hours as needed for severe pain  360 tablet  0  . polyethylene glycol powder (GLYCOLAX/MIRALAX) powder Take 17 g by mouth daily as needed.  3350 g  1  . simvastatin (ZOCOR) 40 MG tablet Take 40 mg by mouth every evening. Take 1 tablet daily to lower cholesterol.      . solifenacin (VESICARE) 10 MG tablet Take 1 tablet (10 mg total) by mouth daily.  30 tablet  3  . spironolactone (ALDACTONE) 25 MG tablet Take 25 mg by mouth daily.       . [DISCONTINUED] oxybutynin (DITROPAN-XL) 5 MG 24 hr tablet Take one tablet by mouth once daily for bladder.       No current facility-administered medications for this visit.    Allergies:    Allergies  Allergen Reactions  . Enalapril Swelling    Facial    Social History:  The patient  reports that she has quit smoking. Her smoking use included Cigarettes. She smoked 0.00 packs per day. Her smokeless tobacco use includes Snuff. She reports that she does not drink alcohol or use illicit drugs.   ROS:  Please see the history of present illness.   Appetite good. No palpitations. No transient neurological symptoms. She refuses to go to the emergency room when she falls.   All other systems reviewed and negative.   OBJECTIVE: VS:  BP 138/88  Pulse 53  Ht 4\' 11"  (1.499 m)  Wt 197 lb (89.359 kg)  BMI 39.77 kg/m2 Well nourished, well developed, in no acute distress, obese HEENT: normal Neck: JVD flat. Carotid bruit absent  Cardiac:  normal S1, S2; RRR; 1-2 of 6 systolic murmur right upper sternal border  murmur Lungs:  clear to auscultation bilaterally, no wheezing, rhonchi or rales Abd: soft, nontender, no hepatomegaly Ext: Edema  Absent . Pulses 1+ bilateral  Skin: warm and dry Neuro:  CNs 2-12  intact, no focal abnormalities noted  EKG:  Sinus bradycardia, left atrial abnormality, old inferior infarct and poor R-wave progression.       Signed, Darci Needle III, MD 07/01/2013 12:00 PM

## 2013-07-03 ENCOUNTER — Encounter: Payer: Self-pay | Admitting: *Deleted

## 2013-07-06 ENCOUNTER — Encounter: Payer: Self-pay | Admitting: *Deleted

## 2013-07-06 ENCOUNTER — Encounter (INDEPENDENT_AMBULATORY_CARE_PROVIDER_SITE_OTHER): Payer: Medicare Other

## 2013-07-06 DIAGNOSIS — R55 Syncope and collapse: Secondary | ICD-10-CM | POA: Diagnosis not present

## 2013-07-06 NOTE — Progress Notes (Signed)
Patient ID: Abigail Brooks, female   DOB: 29-May-1935, 78 y.o.   MRN: [id number] E-Cardio 48 hour holter monitor applied to patient.

## 2013-07-24 ENCOUNTER — Telehealth: Payer: Self-pay

## 2013-07-24 NOTE — Telephone Encounter (Signed)
pt aware of monitor results.No HR >85bmp.probable chrono tropic incompetence.pt verbalized understanding.

## 2013-07-30 ENCOUNTER — Other Ambulatory Visit: Payer: Self-pay | Admitting: Nurse Practitioner

## 2013-08-05 ENCOUNTER — Encounter: Payer: Self-pay | Admitting: Internal Medicine

## 2013-08-05 ENCOUNTER — Other Ambulatory Visit: Payer: Self-pay | Admitting: Nurse Practitioner

## 2013-08-05 ENCOUNTER — Ambulatory Visit (INDEPENDENT_AMBULATORY_CARE_PROVIDER_SITE_OTHER): Payer: Medicare Other | Admitting: Internal Medicine

## 2013-08-05 VITALS — BP 144/86 | HR 58 | Temp 98.1°F | Wt 195.6 lb

## 2013-08-05 DIAGNOSIS — W19XXXA Unspecified fall, initial encounter: Secondary | ICD-10-CM

## 2013-08-05 DIAGNOSIS — M899 Disorder of bone, unspecified: Secondary | ICD-10-CM

## 2013-08-05 DIAGNOSIS — F329 Major depressive disorder, single episode, unspecified: Secondary | ICD-10-CM

## 2013-08-05 DIAGNOSIS — M949 Disorder of cartilage, unspecified: Secondary | ICD-10-CM

## 2013-08-05 DIAGNOSIS — F411 Generalized anxiety disorder: Secondary | ICD-10-CM

## 2013-08-05 DIAGNOSIS — E119 Type 2 diabetes mellitus without complications: Secondary | ICD-10-CM

## 2013-08-05 DIAGNOSIS — W19XXXD Unspecified fall, subsequent encounter: Secondary | ICD-10-CM

## 2013-08-05 DIAGNOSIS — K5900 Constipation, unspecified: Secondary | ICD-10-CM | POA: Diagnosis not present

## 2013-08-05 DIAGNOSIS — E1159 Type 2 diabetes mellitus with other circulatory complications: Secondary | ICD-10-CM

## 2013-08-05 DIAGNOSIS — E1169 Type 2 diabetes mellitus with other specified complication: Secondary | ICD-10-CM

## 2013-08-05 DIAGNOSIS — E785 Hyperlipidemia, unspecified: Secondary | ICD-10-CM

## 2013-08-05 DIAGNOSIS — R296 Repeated falls: Secondary | ICD-10-CM | POA: Insufficient documentation

## 2013-08-05 DIAGNOSIS — I10 Essential (primary) hypertension: Secondary | ICD-10-CM

## 2013-08-05 NOTE — Patient Instructions (Signed)
Stop taking metformin form today

## 2013-08-05 NOTE — Progress Notes (Signed)
Patient ID: Abigail Brooks, female   DOB: 06-Nov-1934, 78 y.o.   MRN: [id number]  Chief Complaint  Patient presents with  . Follow-up    1 month f/u  . other    never had Dexa scan   Allergies  Allergen Reactions  . Enalapril Swelling    Facial    HPI:   78 y/o female patient is here for routine visit. She denies any concern this visit. She follows with her cardiologist. She is independent with her ADLs, uses a cane and lives alone. She is complaint with her medications. Her mood has been better. Had an episode of fall last week and was a mechanical fall as she slipped on the onion peel. She hurt her left knee but now feels it is resolving. Reviewed note from cardiology for syncopal work up. She had a holter monitor and as per patient the result was normal. No history of dexa scan in past and last time it was ordered but pt could not get it done  Review of Systems  Constitutional: Negative for fever, chills, weight loss, malaise/fatigue and diaphoresis.  HENT: Negative for congestion, hearing loss, nosebleeds and sore throat.   Eyes: Negative for blurred vision, double vision, photophobia, pain, discharge and redness.  Respiratory: Negative for cough, shortness of breath and stridor.   Cardiovascular: Negative for chest pain, palpitations, orthopnea, claudication, leg swelling and PND.  Gastrointestinal: Positive for constipation that has improved with current regimen. Negative for heartburn, nausea, vomiting, abdominal pain and diarrhea.  Genitourinary: Positive for urgency and frequency. Negative for dysuria and hematuria.  Musculoskeletal: Positive for back pain and joint pain. Negative for falls, myalgias and neck pain.  Skin: Negative for itching and rash.  Neurological: Negative for dizziness, tingling, seizures, loss of consciousness, weakness and headaches.  Psychiatric/Behavioral: Negative for depression and memory loss. The patient is nervous/anxious.      Past Medical History   Diagnosis Date  . Coronary artery disease     stents x 2, sees Dr. Halina Andreas practice  . Congestive heart failure     2012  . Myocardial infarction     "unsure of when"  . Hypertension   . Diabetes mellitus   . Shortness of breath   . Anemia   . Blood transfusion     when had hysterectomy  . Hypothyroidism     has had hx of treatment  . Urinary frequency   . Urinary tract infection     hx of  . GERD (gastroesophageal reflux disease)   . H/O hiatal hernia   . Arthritis   . Depression   . Spinal stenosis     with bone spurs and ruptured disc  . Cataract   . Blood transfusion without reported diagnosis   . Complications affecting other specified body systems, hypertension   . Other malaise and fatigue   . Dysuria   . Unspecified vitamin D deficiency   . Anxiety state, unspecified   . Other B-complex deficiencies   . Abnormality of gait   . Other and unspecified hyperlipidemia    Past Surgical History  Procedure Laterality Date  . Cataract extraction w/ intraocular lens  implant, bilateral    . Bladder suspension    . Coronary      stents x 2, stent implant placed9/21/06  . Back surgery      ruptured disc, lumbar area  . Appendectomy    . Cardiac catheterization    . Lumbar laminectomy/decompression microdiscectomy  12/21/2011  Procedure: LUMBAR LAMINECTOMY/DECOMPRESSION MICRODISCECTOMY 1 LEVEL;  Surgeon: Temple Pacini, MD;  Location: [facility];  Service: Neurosurgery;  Laterality: Bilateral;  Lumbar four-five decompressive lumbar laminectomy; Right Lumbar four-five microdiscectomy  . Eye surgery    . Spine surgery    . Abdominal hysterectomy  1986   Medication reviewed. See [facility]  Physical exam BP 144/86  Pulse 58  Temp(Src) 98.1 F (36.7 C) (Oral)  Wt 195 lb 9.6 oz (88.724 kg)  SpO2 97%  General- elderly female in no acute distress, obese Head- atraumatic, normocephalic Eyes- PERRLA, EOMI, no pallor, no icterus, no discharge Neck- no lymphadenopathy,  no thyromegaly, no jugular vein distension, no carotid bruit Ears- left ear normal tympanic membrane and normal external ear canal , right ear normal tympanic membrane and normal external ear canal Chest- no chest wall deformities, no chest wall tenderness Cardiovascular- normal s1,s2, no murmurs/ rubs/ gallops Respiratory- bilateral clear to auscultation, no wheeze, no rhonchi, no crackles Abdomen- bowel sounds present, soft, non tender Musculoskeletal- able to move all 4 extremities, no spinal and paraspinal tenderness, using a cane Neurological- no focal deficit, normal reflexes, normal muscle strength, normal sensation to fine touch and vibration Psychiatry- alert and oriented to person, place and time, normal mood and affect  Labs- Lab Results  Component Value Date   HGBA1C 5.9* 06/08/2013   CMP     Component Value Date/Time   NA 144 06/08/2013 0853   NA 140 08/10/2012 1255   K 3.9 06/08/2013 0853   CL 99 06/08/2013 0853   CO2 30* 06/08/2013 0853   GLUCOSE 144* 06/08/2013 0853   GLUCOSE 108* 08/10/2012 1255   BUN 14 06/08/2013 0853   BUN 25* 08/10/2012 1255   CREATININE 0.80 06/08/2013 0853   CALCIUM 9.5 06/08/2013 0853   PROT 6.3 06/08/2013 0853   PROT 6.9 08/10/2012 1255   ALBUMIN 3.7 08/10/2012 1255   AST 11 06/08/2013 0853   ALT 14 06/08/2013 0853   ALKPHOS 58 06/08/2013 0853   BILITOT 0.5 06/08/2013 0853   GFRNONAA 72 06/08/2013 0853   GFRAA 83 06/08/2013 0853   Assessment/plan  1. Unspecified constipation Continue colace and miralax daily as needed for now.   2. Type II or unspecified type diabetes mellitus without mention of complication, not stated as uncontrolled Controlled a1c in prediabetes range. Will stop metformin for now and check a1c next visit - Hemoglobin A1c; Future  3. Cartilage disorder Will schedule dexa scan. Continue ca-vitamin d  4. Depression Stable this visit. Will continue citalopram. Continue prn clonazepam  5. Anxiety state,  unspecified Continue prn lorazepam  6. Other and unspecified hyperlipidemia continue simvastatin and fish oil supplement, monitor lipid panel periodically  7. Hypertension associated with diabetes Under control in the office. Continue current regimen of metoprolol, hydralazine, losartan-hctz. Diet and walking for exercise encouraged. Monitor bmp. continue aspirin - Basic Metabolic Panel; Future  8. Falls, subsequent encounter To use her cane all the time, fall precautions

## 2013-08-06 ENCOUNTER — Other Ambulatory Visit: Payer: Medicare Other

## 2013-08-06 ENCOUNTER — Other Ambulatory Visit: Payer: Self-pay | Admitting: *Deleted

## 2013-08-06 ENCOUNTER — Ambulatory Visit: Payer: Medicare Other

## 2013-08-25 ENCOUNTER — Other Ambulatory Visit: Payer: Self-pay | Admitting: Internal Medicine

## 2013-09-01 ENCOUNTER — Other Ambulatory Visit: Payer: Self-pay | Admitting: Internal Medicine

## 2013-09-06 ENCOUNTER — Other Ambulatory Visit: Payer: Self-pay | Admitting: *Deleted

## 2013-09-06 DIAGNOSIS — E785 Hyperlipidemia, unspecified: Secondary | ICD-10-CM

## 2013-09-09 ENCOUNTER — Other Ambulatory Visit: Payer: Self-pay | Admitting: Internal Medicine

## 2013-09-10 ENCOUNTER — Other Ambulatory Visit: Payer: Self-pay | Admitting: *Deleted

## 2013-09-10 MED ORDER — CLONAZEPAM 0.5 MG PO TABS
ORAL_TABLET | ORAL | Status: DC
Start: 2013-09-10 — End: 2014-01-07

## 2013-09-14 ENCOUNTER — Other Ambulatory Visit: Payer: Self-pay | Admitting: *Deleted

## 2013-09-14 MED ORDER — NITROGLYCERIN 0.4 MG SL SUBL: 0.4000 mg | SUBLINGUAL_TABLET | SUBLINGUAL | Status: DC | PRN

## 2013-09-25 ENCOUNTER — Other Ambulatory Visit: Payer: Medicare Other

## 2013-09-25 ENCOUNTER — Ambulatory Visit
Admission: RE | Admit: 2013-09-25 | Discharge: 2013-09-25 | Disposition: A | Discharge status: Home / Self Care | Payer: Medicare Other | Source: Ambulatory Visit | Attending: Internal Medicine | Admitting: Internal Medicine

## 2013-09-25 DIAGNOSIS — Z0000 Encounter for general adult medical examination without abnormal findings: Secondary | ICD-10-CM

## 2013-09-25 DIAGNOSIS — M949 Disorder of cartilage, unspecified: Secondary | ICD-10-CM

## 2013-09-25 DIAGNOSIS — Z1231 Encounter for screening mammogram for malignant neoplasm of breast: Secondary | ICD-10-CM | POA: Diagnosis not present

## 2013-09-25 DIAGNOSIS — M899 Disorder of bone, unspecified: Secondary | ICD-10-CM | POA: Diagnosis not present

## 2013-09-29 ENCOUNTER — Other Ambulatory Visit: Payer: Self-pay | Admitting: Internal Medicine

## 2013-09-29 DIAGNOSIS — R928 Other abnormal and inconclusive findings on diagnostic imaging of breast: Secondary | ICD-10-CM

## 2013-10-07 ENCOUNTER — Emergency Department (HOSPITAL_COMMUNITY): Payer: Medicare Other

## 2013-10-07 ENCOUNTER — Emergency Department (HOSPITAL_COMMUNITY)
Admission: EM | Admit: 2013-10-07 | Discharge: 2013-10-07 | Disposition: A | Discharge status: Home / Self Care | Payer: Medicare Other | Attending: Emergency Medicine | Admitting: Emergency Medicine

## 2013-10-07 ENCOUNTER — Encounter (HOSPITAL_COMMUNITY): Payer: Self-pay | Admitting: Emergency Medicine

## 2013-10-07 ENCOUNTER — Other Ambulatory Visit: Payer: Self-pay | Admitting: Internal Medicine

## 2013-10-07 DIAGNOSIS — I2510 Atherosclerotic heart disease of native coronary artery without angina pectoris: Secondary | ICD-10-CM | POA: Diagnosis not present

## 2013-10-07 DIAGNOSIS — Z792 Long term (current) use of antibiotics: Secondary | ICD-10-CM | POA: Diagnosis not present

## 2013-10-07 DIAGNOSIS — F411 Generalized anxiety disorder: Secondary | ICD-10-CM | POA: Insufficient documentation

## 2013-10-07 DIAGNOSIS — I10 Essential (primary) hypertension: Secondary | ICD-10-CM | POA: Insufficient documentation

## 2013-10-07 DIAGNOSIS — I509 Heart failure, unspecified: Secondary | ICD-10-CM | POA: Diagnosis not present

## 2013-10-07 DIAGNOSIS — S298XXA Other specified injuries of thorax, initial encounter: Secondary | ICD-10-CM | POA: Diagnosis not present

## 2013-10-07 DIAGNOSIS — F3289 Other specified depressive episodes: Secondary | ICD-10-CM | POA: Diagnosis not present

## 2013-10-07 DIAGNOSIS — Z7982 Long term (current) use of aspirin: Secondary | ICD-10-CM | POA: Diagnosis not present

## 2013-10-07 DIAGNOSIS — G8929 Other chronic pain: Secondary | ICD-10-CM | POA: Insufficient documentation

## 2013-10-07 DIAGNOSIS — N390 Urinary tract infection, site not specified: Secondary | ICD-10-CM | POA: Diagnosis not present

## 2013-10-07 DIAGNOSIS — S0990XA Unspecified injury of head, initial encounter: Secondary | ICD-10-CM | POA: Insufficient documentation

## 2013-10-07 DIAGNOSIS — Z87891 Personal history of nicotine dependence: Secondary | ICD-10-CM | POA: Insufficient documentation

## 2013-10-07 DIAGNOSIS — F29 Unspecified psychosis not due to a substance or known physiological condition: Secondary | ICD-10-CM | POA: Insufficient documentation

## 2013-10-07 DIAGNOSIS — D649 Anemia, unspecified: Secondary | ICD-10-CM | POA: Insufficient documentation

## 2013-10-07 DIAGNOSIS — Z87440 Personal history of urinary (tract) infections: Secondary | ICD-10-CM | POA: Diagnosis not present

## 2013-10-07 DIAGNOSIS — W19XXXA Unspecified fall, initial encounter: Secondary | ICD-10-CM

## 2013-10-07 DIAGNOSIS — Z9181 History of falling: Secondary | ICD-10-CM | POA: Diagnosis not present

## 2013-10-07 DIAGNOSIS — I252 Old myocardial infarction: Secondary | ICD-10-CM | POA: Insufficient documentation

## 2013-10-07 DIAGNOSIS — M545 Low back pain, unspecified: Secondary | ICD-10-CM | POA: Insufficient documentation

## 2013-10-07 DIAGNOSIS — E559 Vitamin D deficiency, unspecified: Secondary | ICD-10-CM | POA: Insufficient documentation

## 2013-10-07 DIAGNOSIS — M129 Arthropathy, unspecified: Secondary | ICD-10-CM | POA: Diagnosis not present

## 2013-10-07 DIAGNOSIS — F329 Major depressive disorder, single episode, unspecified: Secondary | ICD-10-CM | POA: Insufficient documentation

## 2013-10-07 DIAGNOSIS — K219 Gastro-esophageal reflux disease without esophagitis: Secondary | ICD-10-CM | POA: Insufficient documentation

## 2013-10-07 DIAGNOSIS — E785 Hyperlipidemia, unspecified: Secondary | ICD-10-CM | POA: Insufficient documentation

## 2013-10-07 DIAGNOSIS — E119 Type 2 diabetes mellitus without complications: Secondary | ICD-10-CM | POA: Insufficient documentation

## 2013-10-07 DIAGNOSIS — Z9889 Other specified postprocedural states: Secondary | ICD-10-CM | POA: Diagnosis not present

## 2013-10-07 DIAGNOSIS — Z9861 Coronary angioplasty status: Secondary | ICD-10-CM | POA: Diagnosis not present

## 2013-10-07 DIAGNOSIS — R079 Chest pain, unspecified: Secondary | ICD-10-CM | POA: Diagnosis not present

## 2013-10-07 DIAGNOSIS — R296 Repeated falls: Secondary | ICD-10-CM | POA: Insufficient documentation

## 2013-10-07 DIAGNOSIS — Y929 Unspecified place or not applicable: Secondary | ICD-10-CM | POA: Insufficient documentation

## 2013-10-07 DIAGNOSIS — Z79899 Other long term (current) drug therapy: Secondary | ICD-10-CM | POA: Insufficient documentation

## 2013-10-07 DIAGNOSIS — R410 Disorientation, unspecified: Secondary | ICD-10-CM

## 2013-10-07 DIAGNOSIS — Y9301 Activity, walking, marching and hiking: Secondary | ICD-10-CM | POA: Insufficient documentation

## 2013-10-07 DIAGNOSIS — R51 Headache: Secondary | ICD-10-CM | POA: Diagnosis not present

## 2013-10-07 LAB — CBC WITH DIFFERENTIAL/PLATELET
Basophils Absolute: 0 10*3/uL (ref 0.0–0.1)
Basophils Relative: 0 % (ref 0–1)
Eosinophils Absolute: 0.3 10*3/uL (ref 0.0–0.7)
Eosinophils Relative: 5 % (ref 0–5)
HCT: 42.2 % (ref 36.0–46.0)
Hemoglobin: 14.4 g/dL (ref 12.0–15.0)
LYMPHS PCT: 26 % (ref 12–46)
Lymphs Abs: 1.8 10*3/uL (ref 0.7–4.0)
MCH: 30.1 pg (ref 26.0–34.0)
MCHC: 34.1 g/dL (ref 30.0–36.0)
MCV: 88.3 fL (ref 78.0–100.0)
MONOS PCT: 8 % (ref 3–12)
Monocytes Absolute: 0.5 10*3/uL (ref 0.1–1.0)
Neutro Abs: 4.3 10*3/uL (ref 1.7–7.7)
Neutrophils Relative %: 61 % (ref 43–77)
PLATELETS: 181 10*3/uL (ref 150–400)
RBC: 4.78 MIL/uL (ref 3.87–5.11)
RDW: 13.5 % (ref 11.5–15.5)
WBC: 7 10*3/uL (ref 4.0–10.5)

## 2013-10-07 LAB — BASIC METABOLIC PANEL
BUN: 21 mg/dL (ref 6–23)
CO2: 27 meq/L (ref 19–32)
Calcium: 10.4 mg/dL (ref 8.4–10.5)
Chloride: 99 mEq/L (ref 96–112)
Creatinine, Ser: 0.94 mg/dL (ref 0.50–1.10)
GFR calc Af Amer: 66 mL/min — ABNORMAL LOW (ref >90)
GFR calc non Af Amer: 57 mL/min — ABNORMAL LOW (ref >90)
Glucose, Bld: 102 mg/dL — ABNORMAL HIGH (ref 70–99)
POTASSIUM: 4 meq/L (ref 3.7–5.3)
SODIUM: 139 meq/L (ref 137–147)

## 2013-10-07 LAB — URINALYSIS, ROUTINE W REFLEX MICROSCOPIC
Bilirubin Urine: NEGATIVE
Glucose, UA: NEGATIVE mg/dL
HGB URINE DIPSTICK: NEGATIVE
Ketones, ur: NEGATIVE mg/dL
Nitrite: POSITIVE — AB
Protein, ur: 30 mg/dL — AB
SPECIFIC GRAVITY, URINE: 1.013 (ref 1.005–1.030)
Urobilinogen, UA: 0.2 mg/dL (ref 0.0–1.0)
pH: 5.5 (ref 5.0–8.0)

## 2013-10-07 LAB — URINE MICROSCOPIC-ADD ON

## 2013-10-07 MED ORDER — CEPHALEXIN 500 MG PO CAPS: 500.0000 mg | ORAL_CAPSULE | Freq: Four times a day (QID) | ORAL | Status: DC

## 2013-10-07 MED ORDER — CEPHALEXIN 500 MG PO CAPS
500.0000 mg | ORAL_CAPSULE | Freq: Once | ORAL | Status: AC
  Administered 2013-10-07: 500 mg via ORAL
  Filled 2013-10-07: qty 1

## 2013-10-07 NOTE — Discharge Instructions (Signed)
Urinary Tract Infection  Urinary tract infections (UTIs) can develop anywhere along your urinary tract. Your urinary tract is your body's drainage system for removing wastes and extra water. Your urinary tract includes two kidneys, two ureters, a bladder, and a urethra. Your kidneys are a pair of bean-shaped organs. Each kidney is about the size of your fist. They are located below your ribs, one on each side of your spine.  CAUSES  Infections are caused by microbes, which are microscopic organisms, including fungi, viruses, and bacteria. These organisms are so small that they can only be seen through a microscope. Bacteria are the microbes that most commonly cause UTIs.  SYMPTOMS   Symptoms of UTIs may vary by age and gender of the patient and by the location of the infection. Symptoms in young women typically include a frequent and intense urge to urinate and a painful, burning feeling in the bladder or urethra during urination. Older women and men are more likely to be tired, shaky, and weak and have muscle aches and abdominal pain. A fever may mean the infection is in your kidneys. Other symptoms of a kidney infection include pain in your back or sides below the ribs, nausea, and vomiting.  DIAGNOSIS  To diagnose a UTI, your caregiver will ask you about your symptoms. Your caregiver also will ask to provide a urine sample. The urine sample will be tested for bacteria and white blood cells. White blood cells are made by your body to help fight infection.  TREATMENT   Typically, UTIs can be treated with medication. Because most UTIs are caused by a bacterial infection, they usually can be treated with the use of antibiotics. The choice of antibiotic and length of treatment depend on your symptoms and the type of bacteria causing your infection.  HOME CARE INSTRUCTIONS   If you were prescribed antibiotics, take them exactly as your caregiver instructs you. Finish the medication even if you feel better after you  have only taken some of the medication.   Drink enough water and fluids to keep your urine clear or pale yellow.   Avoid caffeine, tea, and carbonated beverages. They tend to irritate your bladder.   Empty your bladder often. Avoid holding urine for long periods of time.   Empty your bladder before and after sexual intercourse.   After a bowel movement, women should cleanse from front to back. Use each tissue only once.  SEEK MEDICAL CARE IF:    You have back pain.   You develop a fever.   Your symptoms do not begin to resolve within 3 days.  SEEK IMMEDIATE MEDICAL CARE IF:    You have severe back pain or lower abdominal pain.   You develop chills.   You have nausea or vomiting.   You have continued burning or discomfort with urination.  MAKE SURE YOU:    Understand these instructions.   Will watch your condition.   Will get help right away if you are not doing well or get worse.  Document Released: 05/16/2005 Document Revised: 02/05/2012 Document Reviewed: 09/14/2011  [employer] Patient Information 2014 [employer].

## 2013-10-07 NOTE — ED Notes (Signed)
Pt ambulated to nurses station with assist of cane.

## 2013-10-07 NOTE — ED Provider Notes (Signed)
CSN: [id number]     Arrival date & time 10/07/13  1524 History   First MD Initiated Contact with Patient 10/07/13 1616     Chief Complaint  Patient presents with  . Fall  . Altered Mental Status     (Consider location/radiation/quality/duration/timing/severity/associated sxs/prior Treatment) Patient is a 79 y.o. female presenting with fall and altered mental status. The history is provided by the patient.  Fall  Altered Mental Status  Sundays her in for evaluation of multiple falls. Her last fall was several days ago. She typically falls when she is walking with her cane. She usually strikes her head in the left side of her chest when she falls. She is at 10 falls in the last 6 months. She has seen her doctor for evaluation of falls 2 months ago, and was told that no additional treatments were necessary. She has had a urinary tract infection recently, that was treated. Present is concerned. She is confused to times, sometimes sees people who are not there. She is otherwise functional, and usually drives her car. Since she has been confused, her son has taken away her keys, recently. She was due to follow up with her neurosurgeon about her chronic low back pain, but did not go today because of bad weather. There is no known fever or chills, nausea, vomiting, cough, shortness of breath, change in bowel or urinary habit. She does have ongoing constipation that is treated with stool softeners and frequently gets up to urinate in the night. There are no other known modifying factors  Past Medical History  Diagnosis Date  . Coronary artery disease     stents x 2, sees Dr. Smith, [facility]  . Congestive heart failure     20 12  . Myocardial infarction     "unsure of when"  . Hypertension   . Diabetes mellitus   . Shortness of breath   . Anemia   . Blood transfusion     when had hysterectomy  . Hypothyroidism     has had hx of treatment  . Urinary frequency   . Urinary tract infection      hx of  . GERD (gastroesophageal reflux disease)   . H/O hiatal hernia   . Arthritis   . Depression   . Spinal stenosis     with bone spurs and ruptured disc  . Cataract   . Blood transfusion without reported diagnosis   . Complications affecting other specified body systems, hypertension   . Other malaise and fatigue   . Dysuria   . Unspecified vitamin D deficiency   . Anxiety state, unspecified   . Other B-complex deficiencies   . Abnormality of gait   . Other and unspecified hyperlipidemia    Past Surgical History  Procedure Laterality Date  . Cataract extraction w/ intraocular lens  implant, bilateral    . Bladder suspension    . Coronary      stents x 2, stent implant placed9/21/06  . Back surgery      ruptured disc, lumbar area  . Appendectomy    . Cardiac catheterization    . Lumbar laminectomy/decompression microdiscectomy  12/21/2011    Procedure: LUMBAR LAMINECTOMY/DECOMPRESSION MICRODISCECTOMY 1 LEVEL;  Surgeon: Temple Pacini, MD;  Location: [facility];  Service: Neurosurgery;  Laterality: Bilateral;  Lumbar four-five decompressive lumbar laminectomy; Right Lumbar four-five microdiscectomy  . Eye surgery    . Spine surgery    . Abdominal hysterectomy  1986   Family History  Problem Relation Age of Onset  . Anesthesia problems Sister   . Anesthesia problems Son   . Heart disease Mother    History  Substance Use Topics  . Smoking status: Former Smoker    Types: Cigarettes  . Smokeless tobacco: Current User    Types: Snuff     Comment: 40 years ago  . Alcohol Use: No   OB History   Grav Para Term Preterm Abortions TAB SAB Ect Mult Living                 Review of Systems  All other systems reviewed and are negative.      Allergies  Enalapril  Home Medications   Current Outpatient Rx  Name  Route  Sig  Dispense  Refill  . aspirin EC 325 MG tablet   Oral   Take 325 mg by mouth daily. Take 1 tablet daily.         . Calcium  Carbonate-Vitamin D (CALCIUM + D PO)   Oral   Take 1 tablet by mouth daily.         . citalopram (CELEXA) 40 MG tablet      TAKE 1 TABLET EVERY DAY   30 tablet   3   . clonazePAM (KLONOPIN) 0.5 MG tablet      Take one tablet by mouth three times daily for anxiety   90 tablet   3   . docusate sodium (COLACE) 100 MG capsule   Oral   Take 200 mg by mouth 2 (two) times daily as needed. For stool softener.         . ferrous sulfate 325 (65 FE) MG tablet      TAKE 1 TABLET BY MOUTH EVERY DAY FOR ANEMIA   30 tablet   3   . fish oil-omega-3 fatty acids 1000 MG capsule   Oral   Take 1 g by mouth daily. Take 1 tablet by mouth with meals.         . hydrALAZINE (APRESOLINE) 25 MG tablet      TAKE 1 TABLET 3 TIMES A DAY   90 tablet   5   . losartan-hydrochlorothiazide (HYZAAR) 50-12.5 MG per tablet   Oral   Take 1 tablet by mouth daily.   90 tablet   3   . metoprolol (LOPRESSOR) 100 MG tablet   Oral   Take 100 mg by mouth 2 (two) times daily.         Marland Kitchen oxyCODONE-acetaminophen (PERCOCET/ROXICET) 5-325 MG per tablet      Take one tablet by mouth every 6 hours as needed for pain; Take two tablets by mouth every 6 hours as needed for severe pain   360 tablet   0   . polyethylene glycol powder (GLYCOLAX/MIRALAX) powder   Oral   Take 17 g by mouth daily as needed.   3350 g   1   . solifenacin (VESICARE) 10 MG tablet   Oral   Take 1 tablet (10 mg total) by mouth daily.   30 tablet   3   . cephALEXin (KEFLEX) 500 MG capsule   Oral   Take 1 capsule (500 mg total) by mouth 4 (four) times daily.   28 capsule   0   . nitroGLYCERIN (NITROSTAT) 0.4 MG SL tablet   Sublingual   Place 1 tablet (0.4 mg total) under the tongue every 5 (five) minutes as needed. maximum, 3 tablets in 15 minutes.   25 tablet  5    BP 196/66  Pulse 56  Temp(Src) 98.7 F (37.1 C) (Oral)  Resp 18  SpO2 94% Physical Exam  Nursing note and vitals reviewed. Constitutional: She is  oriented to person, place, and time. She appears well-developed and well-nourished.  Elderly frail  HENT:  Head: Normocephalic and atraumatic.  No visible or palpable injury to the skull  Eyes: Conjunctivae and EOM are normal. Pupils are equal, round, and reactive to light.  Neck: Normal range of motion and phonation normal. Neck supple.  Cardiovascular: Normal rate, regular rhythm and intact distal pulses.   Pulmonary/Chest: Effort normal and breath sounds normal. She exhibits tenderness (Diffuse left chest wall tenderness without crepitation, or deformity).  Abdominal: Soft. She exhibits no distension. There is no tenderness. There is no guarding.  Musculoskeletal: Normal range of motion.  Neurological: She is alert and oriented to person, place, and time. She exhibits normal muscle tone.  Skin: Skin is warm and dry.  Psychiatric: She has a normal mood and affect. Her behavior is normal. Judgment and thought content normal.    ED Course  Procedures (including critical care time)  Medications  cephALEXin (KEFLEX) capsule 500 mg (500 mg Oral Given 10/07/13 1854)    Patient Vitals for the past 24 hrs:  BP Temp Temp src Pulse Resp SpO2  10/07/13 1839 196/66 mmHg - - - - -  10/07/13 1734 - - - 56 18 94 %  10/07/13 1558 164/82 mmHg 98.7 F (37.1 C) Oral 57 20 93 %    6:57 PM Reevaluation with update and discussion. After initial assessment and treatment, an updated evaluation reveals she is able to tolerate oral fluids, and ambulate using her cane. Findings discussed with patient and family members, all questions answered. Novi Calia L    Labs Review Labs Reviewed  BASIC METABOLIC PANEL - Abnormal; Notable for the following:    Glucose, Bld 102 (*)    GFR calc non Af Amer 57 (*)    GFR calc Af Amer 66 (*)    All other components within normal limits  URINALYSIS, ROUTINE W REFLEX MICROSCOPIC - Abnormal; Notable for the following:    Protein, ur 30 (*)    Nitrite POSITIVE (*)     Leukocytes, UA SMALL (*)    All other components within normal limits  URINE MICROSCOPIC-ADD ON - Abnormal; Notable for the following:    Bacteria, UA MANY (*)    All other components within normal limits  URINE CULTURE  CBC WITH DIFFERENTIAL   Imaging Review Dg Ribs Unilateral W/chest Left  10/07/2013   CLINICAL DATA:  Fall with left-sided rib pain.  EXAM: LEFT RIBS AND CHEST - 3+ VIEW  COMPARISON:  DG CHEST 2V dated 10/13/2012; DG CHEST 2 VIEW dated 12/17/2011  FINDINGS: Stable cardiomegaly. No evidence of pneumothorax, pleural fluid or pulmonary consolidation. Left rib films show no evidence of fracture or lesion. Adjacent soft tissues are unremarkable.  IMPRESSION: No evidence of left rib fracture.   Electronically Signed   By: Irish Lack M.D.   On: 10/07/2013 17:24   Ct Head Wo Contrast  10/07/2013   CLINICAL DATA:  Fell, head head, confusion.  EXAM: CT HEAD WITHOUT CONTRAST  TECHNIQUE: Contiguous axial images were obtained from the base of the skull through the vertex without intravenous contrast.  COMPARISON:  None.  FINDINGS: Atherosclerotic and physiologic intracranial calcifications. Mild atrophy. There is no evidence of acute intracranial hemorrhage, brain edema, mass lesion, acute infarction, mass effect, or midline  shift. Acute infarct may be inapparent on noncontrast CT. No other intra-axial abnormalities are seen, and the ventricles and sulci are within normal limits in size and symmetry. No abnormal extra-axial fluid collections or masses are identified. No significant calvarial abnormality.  IMPRESSION: Negative for bleed or other acute intracranial process.   Electronically Signed   By: Oley Balmaniel  Hassell M.D.   On: 10/07/2013 17:06    EKG Interpretation    Date/Time:  Wednesday October 07 2013 16:16:18 EST Ventricular Rate:  54 PR Interval:  226 QRS Duration: 91 QT Interval:  477 QTC Calculation: 452 R Axis:   -84 Text Interpretation:  Sinus rhythm Prolonged PR  interval Inferior infarct, old Anterior infarct, old Baseline wander in lead(s) I II III aVR aVF V2 V3 Since last tracing PR interval has lengthened Confirmed by Glyn Zendejas  MD, Yuta Cipollone ([id number]) on 10/07/2013 4:39:53 PM            MDM   Final diagnoses:  UTI (urinary tract infection)  Fall  Confusion   UTI with frequent falls over 6 months. Update injury from the fall, which was several days ago. She has episodic confusion, which is nonspecific. It seems to be characterized primarily by memory loss. Doubt pneumonia, intracranial injury, rib fracture, serious bacterial infection or metabolic instability  Nursing Notes Reviewed/ Care Coordinated Applicable Imaging Reviewed Interpretation of Laboratory Data incorporated into ED treatment  The patient appears reasonably screened and/or stabilized for discharge and I doubt any other medical condition or other [facility] requiring further screening, evaluation, or treatment in the ED at this time prior to discharge.  Plan: Home Medications- Keflex; Home Treatments- rest; return here if the recommended treatment, does not improve the symptoms; Recommended follow up- PCP, evaluation in one week, and repeat urine culture at that time    Flint MelterElliott L Donnae Michels, MD 10/07/13 1859

## 2013-10-07 NOTE — ED Notes (Signed)
Pt has been having falls along with confusion. Son states she has had episodes like this in the past dealing with UTIs. Pt has been hitting head when she has been falling so son is worried about the confusion.

## 2013-10-09 LAB — URINE CULTURE

## 2013-10-11 ENCOUNTER — Telehealth (HOSPITAL_COMMUNITY): Payer: Self-pay | Admitting: Emergency Medicine

## 2013-10-11 NOTE — ED Notes (Signed)
Post ED Visit - Positive Culture Follow-up  Culture report reviewed by antimicrobial stewardship pharmacist: [] Wes Dulaney, Pharm.D., BCPS [] Jeremy Frens, Pharm.D., BCPS [] Elizabeth Martin, Pharm.D., BCPS [x] Minh Pham, Pharm.D., BCPS, AAHIVP [] Michelle Turner, Pharm.D., BCPS, AAHIVP  Positive urine culture Treated with Keflex, organism sensitive to the same and no further patient follow-up is required at this time.  Abigail Brooks 10/11/2013, 6:15 PM   

## 2013-10-21 ENCOUNTER — Ambulatory Visit (INDEPENDENT_AMBULATORY_CARE_PROVIDER_SITE_OTHER): Payer: Medicare Other | Admitting: Internal Medicine

## 2013-10-21 ENCOUNTER — Ambulatory Visit
Admission: RE | Admit: 2013-10-21 | Discharge: 2013-10-21 | Disposition: A | Discharge status: Home / Self Care | Payer: Medicare Other | Source: Ambulatory Visit | Attending: Internal Medicine | Admitting: Internal Medicine

## 2013-10-21 ENCOUNTER — Encounter: Payer: Self-pay | Admitting: Internal Medicine

## 2013-10-21 VITALS — BP 150/62 | HR 57 | Temp 99.6°F | Resp 20 | Ht 59.0 in | Wt 199.4 lb

## 2013-10-21 DIAGNOSIS — F32A Depression, unspecified: Secondary | ICD-10-CM

## 2013-10-21 DIAGNOSIS — F22 Delusional disorders: Secondary | ICD-10-CM | POA: Diagnosis not present

## 2013-10-21 DIAGNOSIS — M48062 Spinal stenosis, lumbar region with neurogenic claudication: Secondary | ICD-10-CM

## 2013-10-21 DIAGNOSIS — F329 Major depressive disorder, single episode, unspecified: Secondary | ICD-10-CM | POA: Diagnosis not present

## 2013-10-21 DIAGNOSIS — N390 Urinary tract infection, site not specified: Secondary | ICD-10-CM | POA: Diagnosis not present

## 2013-10-21 DIAGNOSIS — K5900 Constipation, unspecified: Secondary | ICD-10-CM

## 2013-10-21 DIAGNOSIS — R928 Other abnormal and inconclusive findings on diagnostic imaging of breast: Secondary | ICD-10-CM | POA: Diagnosis not present

## 2013-10-21 DIAGNOSIS — F3289 Other specified depressive episodes: Secondary | ICD-10-CM

## 2013-10-21 DIAGNOSIS — I10 Essential (primary) hypertension: Secondary | ICD-10-CM

## 2013-10-21 DIAGNOSIS — F411 Generalized anxiety disorder: Secondary | ICD-10-CM

## 2013-10-21 DIAGNOSIS — R413 Other amnesia: Secondary | ICD-10-CM | POA: Insufficient documentation

## 2013-10-21 MED ORDER — QUETIAPINE FUMARATE 25 MG PO TABS: 25.0000 mg | ORAL_TABLET | Freq: Every day | ORAL | Status: DC

## 2013-10-21 NOTE — Progress Notes (Signed)
Patient ID: Abigail Brooks, female   DOB: Jun 07, 1935, 79 y.o.   MRN: [id number]    Chief Complaint  Patient presents with  . Follow-up   Allergies  Allergen Reactions  . Enalapril Swelling    Facial   HPI:   79 y/o female patient is here with her son.She was recently in ED post fall and with confusion. She was diagnosed to have UTI. She has completed her antibiotics. She still has some confusion as per her son [name] who is here for the visit She has visual and auditory hallucinations for a month now Seeing Dr Abigail Brooks (neurosurgery) tomorrow for her spinal stenosis She is independent with her ADLs. She has stopped driving. She is using a cane, lives alone and has had falls.   Review of Systems   Constitutional: Negative for fever, chills, weight loss HENT: Negative for congestion, hearing loss, nosebleeds and sore throat.    Eyes: Negative for blurred vision, double vision Respiratory: Negative for cough, shortness of breath and stridor.    Cardiovascular: Negative for chest pain, palpitations, orthopnea, leg edema. Has intermittent claudication.   Gastrointestinal: Positive for constipation. Negative for heartburn, nausea, vomiting, abdominal pain and diarrhea.   Genitourinary: Positive for urgency and frequency Musculoskeletal: Positive for back pain and joint pain. Negative for falls, myalgias and neck pain.   Skin: Negative for itching and rash.   Neurological: Negative for dizziness, tingling, seizures, loss of consciousness, weakness and headaches.   Psychiatric/Behavioral: has depression, confusion present this visit  Past Medical History  Diagnosis Date  . Coronary artery disease     stents x 2, sees Dr. Halina AndreasSmith, [facility]  . Congestive heart failure     2012  . Myocardial infarction     "unsure of when"  . Hypertension   . Diabetes mellitus   . Shortness of breath   . Anemia   . Blood transfusion     when had hysterectomy  . Hypothyroidism     has had hx of  treatment  . Urinary frequency   . Urinary tract infection     hx of  . GERD (gastroesophageal reflux disease)   . H/O hiatal hernia   . Arthritis   . Depression   . Spinal stenosis     with bone spurs and ruptured disc  . Cataract   . Blood transfusion without reported diagnosis   . Complications affecting other specified body systems, hypertension   . Other malaise and fatigue   . Dysuria   . Unspecified vitamin D deficiency   . Anxiety state, unspecified   . Other B-complex deficiencies   . Abnormality of gait   . Other and unspecified hyperlipidemia    Past Surgical History  Procedure Laterality Date  . Cataract extraction w/ intraocular lens  implant, bilateral    . Bladder suspension    . Coronary      stents x 2, stent implant placed9/21/06  . Back surgery      ruptured disc, lumbar area  . Appendectomy    . Cardiac catheterization    . Lumbar laminectomy/decompression microdiscectomy  12/21/2011    Procedure: LUMBAR LAMINECTOMY/DECOMPRESSION MICRODISCECTOMY 1 LEVEL;  Surgeon: Temple PaciniHenry A Pool, MD;  Location: [facility];  Service: Neurosurgery;  Laterality: Bilateral;  Lumbar four-five decompressive lumbar laminectomy; Right Lumbar four-five microdiscectomy  . Eye surgery    . Spine surgery    . Abdominal hysterectomy  1986    Current Outpatient Prescriptions on File Prior to Visit  Medication Sig Dispense Refill  . aspirin EC 325 MG tablet Take 325 mg by mouth daily. Take 1 tablet daily.      . Calcium Carbonate-Vitamin D (CALCIUM + D PO) Take 1 tablet by mouth daily.      . citalopram (CELEXA) 40 MG tablet TAKE 1 TABLET EVERY DAY  30 tablet  3  . clonazePAM (KLONOPIN) 0.5 MG tablet Take one tablet by mouth three times daily for anxiety  90 tablet  3  . docusate sodium (COLACE) 100 MG capsule Take 200 mg by mouth 2 (two) times daily as needed. For stool softener.      . ferrous sulfate 325 (65 FE) MG tablet TAKE 1 TABLET BY MOUTH EVERY DAY FOR ANEMIA  30 tablet  3  .  fish oil-omega-3 fatty acids 1000 MG capsule Take 1 g by mouth daily. Take 1 tablet by mouth with meals.      . hydrALAZINE (APRESOLINE) 25 MG tablet TAKE 1 TABLET 3 TIMES A DAY  90 tablet  5  . losartan-hydrochlorothiazide (HYZAAR) 50-12.5 MG per tablet Take 1 tablet by mouth daily.  90 tablet  3  . metoprolol (LOPRESSOR) 100 MG tablet Take 100 mg by mouth 2 (two) times daily.      . nitroGLYCERIN (NITROSTAT) 0.4 MG SL tablet Place 1 tablet (0.4 mg total) under the tongue every 5 (five) minutes as needed. maximum, 3 tablets in 15 minutes.  25 tablet  5  . oxyCODONE-acetaminophen (PERCOCET/ROXICET) 5-325 MG per tablet Take one tablet by mouth every 6 hours as needed for pain; Take two tablets by mouth every 6 hours as needed for severe pain  360 tablet  0  . polyethylene glycol powder (GLYCOLAX/MIRALAX) powder Take 17 g by mouth daily as needed.  3350 g  1  . solifenacin (VESICARE) 10 MG tablet Take 1 tablet (10 mg total) by mouth daily.  30 tablet  3  . [DISCONTINUED] oxybutynin (DITROPAN-XL) 5 MG 24 hr tablet Take one tablet by mouth once daily for bladder.       No current facility-administered medications on file prior to visit.    Physical exam BP 150/62  Pulse 57  Temp(Src) 99.6 F (37.6 C) (Oral)  Resp 20  Ht 4\' 11"  (1.499 m)  Wt 199 lb 6.4 oz (90.447 kg)  BMI 40.25 kg/m2  SpO2 93%  General- elderly female in no acute distress, obese Head- atraumatic, normocephalic Eyes- PERRLA, EOMI, no pallor, no icterus, no discharge Neck- no lymphadenopathy, no thyromegaly, no jugular vein distension, no carotid bruit Chest- no chest wall deformities, no chest wall tenderness Cardiovascular- normal s1,s2, no murmurs/ rubs/ gallops Respiratory- bilateral clear to auscultation, no wheeze, no rhonchi, no crackles Abdomen- bowel sounds present, soft, non tender Musculoskeletal- able to move all 4 extremities, using a cane Neurological- no focal deficit Psychiatry- alert and oriented to  person, place , anxious, confusion noted  Radiological exam Imaging Review Dg Ribs Unilateral W/chest Left  10/07/2013   CLINICAL DATA:  Fall with left-sided rib pain.  EXAM: LEFT RIBS AND CHEST - 3+ VIEW  COMPARISON:  DG CHEST 2V dated 10/13/2012; DG CHEST 2 VIEW dated 12/17/2011  FINDINGS: Stable cardiomegaly. No evidence of pneumothorax, pleural fluid or pulmonary consolidation. Left rib films show no evidence of fracture or lesion. Adjacent soft tissues are unremarkable.  IMPRESSION: No evidence of left rib fracture.   Electronically Signed   By: Irish Lack M.D.   On: 10/07/2013 17:24   Ct Head Wo  Contrast  10/07/2013   CLINICAL DATA:  Larey Seat, head head, confusion.  EXAM: CT HEAD WITHOUT CONTRAST  TECHNIQUE: Contiguous axial images were obtained from the base of the skull through the vertex without intravenous contrast.  COMPARISON:  None.  FINDINGS: Atherosclerotic and physiologic intracranial calcifications. Mild atrophy. There is no evidence of acute intracranial hemorrhage, brain edema, mass lesion, acute infarction, mass effect, or midline shift. Acute infarct may be inapparent on noncontrast CT. No other intra-axial abnormalities are seen, and the ventricles and sulci are within normal limits in size and symmetry. No abnormal extra-axial fluid collections or masses are identified. No significant calvarial abnormality.  IMPRESSION: Negative for bleed or other acute intracranial process.   Electronically Signed   By: Oley Balm M.D.   On: 10/07/2013 17:06    Labs-  CBC    Component Value Date/Time   WBC 7.0 10/07/2013 1640   WBC 7.9 06/08/2013 0853   WBC 7.9 06/08/2013 0853   WBC 6.2 08/10/2012 1151   RBC 4.78 10/07/2013 1640   RBC 4.46 06/08/2013 0853   RBC 4.40 06/08/2013 0853   RBC 4.71 08/10/2012 1151   HGB 14.4 10/07/2013 1640   HGB 14.0 08/10/2012 1151   HCT 42.2 10/07/2013 1640   HCT 43.0 08/10/2012 1151   PLT 181 10/07/2013 1640   MCV 88.3 10/07/2013 1640   MCV 91.4  08/10/2012 1151   MCH 30.1 10/07/2013 1640   MCH 30.3 06/08/2013 0853   MCH 30.5 06/08/2013 0853   MCH 29.7 08/10/2012 1151   MCHC 34.1 10/07/2013 1640   MCHC 33.1 06/08/2013 0853   MCHC 33.3 06/08/2013 0853   MCHC 32.6 08/10/2012 1151   RDW 13.5 10/07/2013 1640   RDW 14.3 06/08/2013 0853   RDW 14.3 06/08/2013 0853   LYMPHSABS 1.8 10/07/2013 1640   LYMPHSABS 1.4 06/08/2013 0853   LYMPHSABS 1.4 06/08/2013 0853   MONOABS 0.5 10/07/2013 1640   EOSABS 0.3 10/07/2013 1640   EOSABS 0.4 06/08/2013 0853   EOSABS 0.4 06/08/2013 0853   BASOSABS 0.0 10/07/2013 1640   BASOSABS 0.0 06/08/2013 0853   BASOSABS 0.0 06/08/2013 0853    CMP     Component Value Date/Time   NA 139 10/07/2013 1640   NA 144 06/08/2013 0853   K 4.0 10/07/2013 1640   CL 99 10/07/2013 1640   CO2 27 10/07/2013 1640   GLUCOSE 102* 10/07/2013 1640   GLUCOSE 144* 06/08/2013 0853   BUN 21 10/07/2013 1640   BUN 14 06/08/2013 0853   CREATININE 0.94 10/07/2013 1640   CALCIUM 10.4 10/07/2013 1640   PROT 6.3 06/08/2013 0853   PROT 6.9 08/10/2012 1255   ALBUMIN 3.7 08/10/2012 1255   AST 11 06/08/2013 0853   ALT 14 06/08/2013 0853   ALKPHOS 58 06/08/2013 0853   BILITOT 0.5 06/08/2013 0853   GFRNONAA 57* 10/07/2013 1640   GFRAA 66* 10/07/2013 1640    Assessment/plan  1. Delusional disorder New onset memory loss. Reviewed ct head. Will rule out infection- check u/a with c/s. Recent cbc and bmp normal. Start seroquel 25 mg qhs for now an can increase to bid if no improvement. Reassess in a week  2. Lumbar stenosis with neurogenic claudication Use the cane, all precautions. Has f/u with neurosurgery tomorrow continue prn pain med. Consider PT referral after reviewing neurosurgery note  3. Depression Continue celexa for now  4. Anxiety state, unspecified Continue prn klonopin  5. Unspecified constipation Stable. No changes made  6. Essential hypertension, benign Elevated SBP  this visit. Is anxious and has pain -could be  contributing to this. Check bp at home. Continue current regimen. Warning signs with elevated bp explained to son  7. Memory loss MMSE 26/30 and failed clock draw Check tsh, b12 for reversible causes Reviewed ct head- brain atrophy seroquel 25 mg daily at bedtime x 3 days, if no improvement increase to 25 mg bid  8. uti Recheck u/a with c/s to rule out infection vs colonization

## 2013-10-21 NOTE — Progress Notes (Signed)
Patient did'nt pass the clock test.

## 2013-10-22 ENCOUNTER — Other Ambulatory Visit: Payer: Self-pay | Admitting: *Deleted

## 2013-10-22 DIAGNOSIS — N390 Urinary tract infection, site not specified: Secondary | ICD-10-CM

## 2013-10-22 DIAGNOSIS — M4310 Spondylolisthesis, site unspecified: Secondary | ICD-10-CM | POA: Diagnosis not present

## 2013-10-22 LAB — RPR: SYPHILIS RPR SCR: NONREACTIVE

## 2013-10-22 LAB — URINALYSIS
BILIRUBIN UA: NEGATIVE
Glucose, UA: NEGATIVE
Ketones, UA: NEGATIVE
Nitrite, UA: POSITIVE — AB
PH UA: 6 (ref 5.0–7.5)
RBC, UA: NEGATIVE
Specific Gravity, UA: 1.019 (ref 1.005–1.030)
Urobilinogen, Ur: 0.2 mg/dL (ref 0.0–1.9)

## 2013-10-22 LAB — VITAMIN B12: Vitamin B-12: 321 pg/mL (ref 211–946)

## 2013-10-22 LAB — TSH: TSH: 2.54 u[IU]/mL (ref 0.450–4.500)

## 2013-10-22 NOTE — Addendum Note (Signed)
Addended by: Waymond CeraMOFFITT, Vaughan Garfinkle L on: 10/22/2013 04:10 PM   Modules accepted: Orders

## 2013-10-22 NOTE — Addendum Note (Signed)
Addended by: Marvia PicklesBANKS, IMELDA on: 10/22/2013 04:12 PM   Modules accepted: Orders

## 2013-10-24 LAB — URINE CULTURE

## 2013-10-26 ENCOUNTER — Telehealth: Payer: Self-pay | Admitting: *Deleted

## 2013-10-26 MED ORDER — SACCHAROMYCES BOULARDII 250 MG PO CAPS: 250.0000 mg | ORAL_CAPSULE | Freq: Two times a day (BID) | ORAL | Status: DC

## 2013-10-26 MED ORDER — AMOXICILLIN-POT CLAVULANATE 875-125 MG PO TABS: 1.0000 | ORAL_TABLET | Freq: Two times a day (BID) | ORAL | Status: DC

## 2013-10-26 NOTE — Telephone Encounter (Signed)
Pt & daughter notified VIA phone regarding results and medications sent to pharmacy.

## 2013-10-27 ENCOUNTER — Ambulatory Visit: Payer: Medicare Other | Admitting: Internal Medicine

## 2013-10-29 ENCOUNTER — Other Ambulatory Visit: Payer: Medicare Other

## 2013-10-29 DIAGNOSIS — I10 Essential (primary) hypertension: Secondary | ICD-10-CM | POA: Diagnosis not present

## 2013-10-29 DIAGNOSIS — E785 Hyperlipidemia, unspecified: Secondary | ICD-10-CM

## 2013-10-29 DIAGNOSIS — E1169 Type 2 diabetes mellitus with other specified complication: Secondary | ICD-10-CM | POA: Diagnosis not present

## 2013-10-29 DIAGNOSIS — E1159 Type 2 diabetes mellitus with other circulatory complications: Secondary | ICD-10-CM

## 2013-10-29 DIAGNOSIS — E119 Type 2 diabetes mellitus without complications: Secondary | ICD-10-CM | POA: Diagnosis not present

## 2013-10-30 LAB — HEMOGLOBIN A1C
Est. average glucose Bld gHb Est-mCnc: 148 mg/dL
Hgb A1c MFr Bld: 6.8 % — ABNORMAL HIGH (ref 4.8–5.6)

## 2013-10-30 LAB — BASIC METABOLIC PANEL
BUN/Creatinine Ratio: 20 (ref 11–26)
BUN: 18 mg/dL (ref 8–27)
CO2: 26 mmol/L (ref 18–29)
Calcium: 9.5 mg/dL (ref 8.7–10.3)
Chloride: 99 mmol/L (ref 97–108)
Creatinine, Ser: 0.91 mg/dL (ref 0.57–1.00)
GFR calc Af Amer: 70 mL/min/{1.73_m2} (ref >59)
GFR, EST NON AFRICAN AMERICAN: 61 mL/min/{1.73_m2} (ref >59)
GLUCOSE: 146 mg/dL — AB (ref 65–99)
POTASSIUM: 4 mmol/L (ref 3.5–5.2)
SODIUM: 144 mmol/L (ref 134–144)

## 2013-11-04 ENCOUNTER — Ambulatory Visit: Payer: Medicare Other | Admitting: Internal Medicine

## 2013-11-04 ENCOUNTER — Ambulatory Visit (INDEPENDENT_AMBULATORY_CARE_PROVIDER_SITE_OTHER): Payer: Medicare Other | Admitting: Internal Medicine

## 2013-11-04 ENCOUNTER — Encounter: Payer: Self-pay | Admitting: Internal Medicine

## 2013-11-04 VITALS — BP 136/70 | HR 60 | Temp 98.0°F | Wt 203.0 lb

## 2013-11-04 DIAGNOSIS — N318 Other neuromuscular dysfunction of bladder: Secondary | ICD-10-CM

## 2013-11-04 DIAGNOSIS — N390 Urinary tract infection, site not specified: Secondary | ICD-10-CM | POA: Diagnosis not present

## 2013-11-04 DIAGNOSIS — E119 Type 2 diabetes mellitus without complications: Secondary | ICD-10-CM | POA: Diagnosis not present

## 2013-11-04 DIAGNOSIS — R413 Other amnesia: Secondary | ICD-10-CM

## 2013-11-04 DIAGNOSIS — I10 Essential (primary) hypertension: Secondary | ICD-10-CM | POA: Diagnosis not present

## 2013-11-04 DIAGNOSIS — N3281 Overactive bladder: Secondary | ICD-10-CM | POA: Insufficient documentation

## 2013-11-04 MED ORDER — FESOTERODINE FUMARATE ER 4 MG PO TB24: 4.0000 mg | ORAL_TABLET | Freq: Every day | ORAL | Status: DC

## 2013-11-04 MED ORDER — METFORMIN HCL 500 MG PO TABS: 500.0000 mg | ORAL_TABLET | Freq: Every day | ORAL | Status: DC

## 2013-11-04 MED ORDER — FLUCONAZOLE 150 MG PO TABS: 150.0000 mg | ORAL_TABLET | Freq: Once | ORAL | Status: DC

## 2013-11-04 NOTE — Progress Notes (Signed)
Patient ID: Abigail Brooks, female   DOB: 07-04-1935, 79 y.o.   MRN: [id number]    Chief Complaint  Patient presents with  . Follow-up    2 week f/y memory, UTI   Allergies  Allergen Reactions  . Enalapril Swelling    Facial   HPI 79 y/o female patient is here for follow up with her son. She was started on seroquel for delusion last visit. She is being treated for klebsiella uti with augmentin. She is also on florastor. Complaints of itching in her vaginal area. Her confusion has cleared up and no recent behavior changes reported. Reviewed her lab results  Review of Systems   Constitutional: Negative for fever, chills, weight loss HENT: Negative for congestion, hearing loss, nosebleeds and sore throat.    Eyes: Negative for blurred vision, double vision Respiratory: Negative for cough, shortness of breath and stridor.    Cardiovascular: Negative for chest pain, palpitations, orthopnea, leg edema. Has intermittent claudication.   Gastrointestinal: Positive for constipation. Negative for heartburn, nausea, vomiting, abdominal pain and diarrhea.   Genitourinary: Positive for urgency. vesicare is not helping her Musculoskeletal: Negative for falls, myalgias and neck pain.   Skin: Negative for itching and rash.   Neurological: Negative for dizziness, tingling, seizures, loss of consciousness, weakness and headaches.   Psychiatric/Behavioral: has depression, confusion present this visit    Past Medical History  Diagnosis Date  . Coronary artery disease     stents x 2, [facility]  . Congestive heart failure     2012  . Myocardial infarction     "unsure of when"  . Hypertension   . Diabetes mellitus   . Shortness of breath   . Anemia   . Blood transfusion     when had hysterectomy  . Hypothyroidism     has had hx of treatment  . Urinary frequency   . Urinary tract infection     hx of  . GERD (gastroesophageal reflux disease)   . H/O hiatal hernia   .  Arthritis   . Depression   . Spinal stenosis     with bone spurs and ruptured disc  . Cataract   . Blood transfusion without reported diagnosis   . Complications affecting other specified body systems, hypertension   . Other malaise and fatigue   . Dysuria   . Unspecified vitamin D deficiency   . Anxiety state, unspecified   . Other B-complex deficiencies   . Abnormality of gait   . Other and unspecified hyperlipidemia    Past Surgical History  Procedure Laterality Date  . Cataract extraction w/ intraocular lens  implant, bilateral    . Bladder suspension    . Coronary      stents x 2, stent implant placed9/21/06  . Back surgery      ruptured disc, lumbar area  . Appendectomy    . Cardiac catheterization    . Lumbar laminectomy/decompression microdiscectomy  12/21/2011    Procedure: LUMBAR LAMINECTOMY/DECOMPRESSION MICRODISCECTOMY 1 LEVEL;  Surgeon: Temple Pacini, MD;  Location: [facility];  Service: Neurosurgery;  Laterality: Bilateral;  Lumbar four-five decompressive lumbar laminectomy; Right Lumbar four-five microdiscectomy  . Eye surgery    . Spine surgery    . Abdominal hysterectomy  1986   Current Outpatient Prescriptions on File Prior to Visit  Medication Sig Dispense Refill  . amoxicillin-clavulanate (AUGMENTIN) 875-125 MG per tablet Take 1 tablet by mouth 2 (two) times daily.  14 tablet  0  . aspirin EC 325 MG tablet Take 325 mg by mouth daily. Take 1 tablet daily.      . Calcium Carbonate-Vitamin D (CALCIUM + D PO) Take 1 tablet by mouth daily.      . citalopram (CELEXA) 40 MG tablet TAKE 1 TABLET EVERY DAY  30 tablet  3  . clonazePAM (KLONOPIN) 0.5 MG tablet Take one tablet by mouth three times daily for anxiety  90 tablet  3  . docusate sodium (COLACE) 100 MG capsule Take 200 mg by mouth 2 (two) times daily as needed. For stool softener.      . ferrous sulfate 325 (65 FE) MG tablet TAKE 1 TABLET BY MOUTH EVERY DAY FOR ANEMIA  30 tablet  3  . fish oil-omega-3 fatty  acids 1000 MG capsule Take 1 g by mouth daily. Take 1 tablet by mouth with meals.      . hydrALAZINE (APRESOLINE) 25 MG tablet TAKE 1 TABLET 3 TIMES A DAY  90 tablet  5  . losartan-hydrochlorothiazide (HYZAAR) 50-12.5 MG per tablet Take 1 tablet by mouth daily.  90 tablet  3  . metoprolol (LOPRESSOR) 100 MG tablet Take 100 mg by mouth 2 (two) times daily.      . nitroGLYCERIN (NITROSTAT) 0.4 MG SL tablet Place 1 tablet (0.4 mg total) under the tongue every 5 (five) minutes as needed. maximum, 3 tablets in 15 minutes.  25 tablet  5  . oxyCODONE-acetaminophen (PERCOCET/ROXICET) 5-325 MG per tablet Take one tablet by mouth every 6 hours as needed for pain; Take two tablets by mouth every 6 hours as needed for severe pain  360 tablet  0  . polyethylene glycol powder (GLYCOLAX/MIRALAX) powder Take 17 g by mouth daily as needed.  3350 g  1  . QUEtiapine (SEROQUEL) 25 MG tablet Take 1 tablet (25 mg total) by mouth at bedtime. Take one tablet daily at bedtime for now and if no improvement, change to twice a day  30 tablet  0  . saccharomyces boulardii (FLORASTOR) 250 MG capsule Take 1 capsule (250 mg total) by mouth 2 (two) times daily.  28 capsule  0  . [DISCONTINUED] oxybutynin (DITROPAN-XL) 5 MG 24 hr tablet Take one tablet by mouth once daily for bladder.       No current facility-administered medications on file prior to visit.    Physical exam BP 136/70  Pulse 60  Temp(Src) 98 F (36.7 C) (Oral)  Wt 203 lb (92.08 kg)  SpO2 95%  General- elderly female in no acute distress, obese Head- atraumatic, normocephalic Eyes- PERRLA, EOMI, no pallor, no icterus, no discharge Neck- no lymphadenopathy, no thyromegaly, no jugular vein distension, no carotid bruit Chest- no chest wall deformities, no chest wall tenderness Cardiovascular- normal s1,s2, no murmurs/ rubs/ gallops Respiratory- bilateral clear to auscultation, no wheeze, no rhonchi, no crackles Abdomen- bowel sounds present, soft, non  tender Musculoskeletal- able to move all 4 extremities, using a cane Neurological- no focal deficit Psychiatry- alert and oriented to person, place , anxious, confusion noted  Labs- CBC    Component Value Date/Time   WBC 7.0 10/07/2013 1640   WBC 7.9 06/08/2013 0853   WBC 7.9 06/08/2013 0853   WBC 6.2 08/10/2012 1151   RBC 4.78 10/07/2013 1640   RBC 4.46 06/08/2013 0853   RBC 4.40 06/08/2013 0853   RBC 4.71 08/10/2012 1151   HGB 14.4 10/07/2013 1640   HGB 14.0 08/10/2012 1151   HCT 42.2 10/07/2013 1640  HCT 43.0 08/10/2012 1151   PLT 181 10/07/2013 1640   MCV 88.3 10/07/2013 1640   MCV 91.4 08/10/2012 1151   MCH 30.1 10/07/2013 1640   MCH 30.3 06/08/2013 0853   MCH 30.5 06/08/2013 0853   MCH 29.7 08/10/2012 1151   MCHC 34.1 10/07/2013 1640   MCHC 33.1 06/08/2013 0853   MCHC 33.3 06/08/2013 0853   MCHC 32.6 08/10/2012 1151   RDW 13.5 10/07/2013 1640   RDW 14.3 06/08/2013 0853   RDW 14.3 06/08/2013 0853   LYMPHSABS 1.8 10/07/2013 1640   LYMPHSABS 1.4 06/08/2013 0853   LYMPHSABS 1.4 06/08/2013 0853   MONOABS 0.5 10/07/2013 1640   EOSABS 0.3 10/07/2013 1640   EOSABS 0.4 06/08/2013 0853   EOSABS 0.4 06/08/2013 0853   BASOSABS 0.0 10/07/2013 1640   BASOSABS 0.0 06/08/2013 0853   BASOSABS 0.0 06/08/2013 0853    CMP     Component Value Date/Time   NA 144 10/29/2013 0810   NA 139 10/07/2013 1640   K 4.0 10/29/2013 0810   CL 99 10/29/2013 0810   CO2 26 10/29/2013 0810   GLUCOSE 146* 10/29/2013 0810   GLUCOSE 102* 10/07/2013 1640   BUN 18 10/29/2013 0810   BUN 21 10/07/2013 1640   CREATININE 0.91 10/29/2013 0810   CALCIUM 9.5 10/29/2013 0810   PROT 6.3 06/08/2013 0853   PROT 6.9 08/10/2012 1255   ALBUMIN 3.7 08/10/2012 1255   AST 11 06/08/2013 0853   ALT 14 06/08/2013 0853   ALKPHOS 58 06/08/2013 0853   BILITOT 0.5 06/08/2013 0853   GFRNONAA 61 10/29/2013 0810   GFRAA 70 10/29/2013 0810   Lab Results  Component Value Date   HGBA1C 6.8* 10/29/2013   Lab Results  Component Value  Date   TSH 2.540 10/21/2013    Assessment/plan  1. UTI (urinary tract infection) Complete course of augmentin with florastor. Will provide one dose of fluconazole to help with fungal infection. Encouraged hydration  2. Essential hypertension, benign Stable. Continue aspirin with hydralazine, hyzaar, lopressor  3. Type II or unspecified type diabetes mellitus without mention of complication, not stated as uncontrolled Reviewed a1c, elevated, start metformin 500 mg daily for now  4. Memory loss Mild cognitive impairment. No recent behavioral changes, continue seroquel with celexa for mood and prn klonopin for anxiety. Keep bp under control. Reversible causes ruled out. Ct head reviewed  5. Overactive bladder Will d/c vesicare and provide toviaz 4 mg daily- 2 weeks sample provided and 30 days script. Common side effect explained

## 2013-11-10 ENCOUNTER — Other Ambulatory Visit: Payer: Self-pay | Admitting: *Deleted

## 2013-11-10 ENCOUNTER — Other Ambulatory Visit: Payer: Medicare Other

## 2013-11-10 DIAGNOSIS — R443 Hallucinations, unspecified: Secondary | ICD-10-CM

## 2013-11-10 DIAGNOSIS — R350 Frequency of micturition: Secondary | ICD-10-CM

## 2013-11-11 ENCOUNTER — Other Ambulatory Visit: Payer: Self-pay | Admitting: Internal Medicine

## 2013-11-11 LAB — URINALYSIS
BILIRUBIN UA: NEGATIVE
Glucose, UA: NEGATIVE
Ketones, UA: NEGATIVE
NITRITE UA: NEGATIVE
PH UA: 6 (ref 5.0–7.5)
RBC, UA: NEGATIVE
Specific Gravity, UA: 1.018 (ref 1.005–1.030)
UUROB: 0.2 mg/dL (ref 0.0–1.9)

## 2013-11-12 LAB — URINE CULTURE

## 2013-11-17 ENCOUNTER — Ambulatory Visit: Payer: Self-pay | Admitting: Internal Medicine

## 2013-11-24 ENCOUNTER — Encounter: Payer: Self-pay | Admitting: Internal Medicine

## 2013-11-25 ENCOUNTER — Encounter: Payer: Self-pay | Admitting: Internal Medicine

## 2013-11-25 ENCOUNTER — Ambulatory Visit (INDEPENDENT_AMBULATORY_CARE_PROVIDER_SITE_OTHER): Payer: Medicare Other | Admitting: Internal Medicine

## 2013-11-25 VITALS — BP 140/88 | HR 75 | Temp 99.0°F | Resp 10 | Wt 200.0 lb

## 2013-11-25 DIAGNOSIS — M48062 Spinal stenosis, lumbar region with neurogenic claudication: Secondary | ICD-10-CM | POA: Diagnosis not present

## 2013-11-25 DIAGNOSIS — N318 Other neuromuscular dysfunction of bladder: Secondary | ICD-10-CM | POA: Diagnosis not present

## 2013-11-25 DIAGNOSIS — I10 Essential (primary) hypertension: Secondary | ICD-10-CM | POA: Diagnosis not present

## 2013-11-25 DIAGNOSIS — E119 Type 2 diabetes mellitus without complications: Secondary | ICD-10-CM

## 2013-11-25 DIAGNOSIS — F329 Major depressive disorder, single episode, unspecified: Secondary | ICD-10-CM | POA: Diagnosis not present

## 2013-11-25 DIAGNOSIS — F3289 Other specified depressive episodes: Secondary | ICD-10-CM | POA: Diagnosis not present

## 2013-11-25 DIAGNOSIS — E1169 Type 2 diabetes mellitus with other specified complication: Secondary | ICD-10-CM | POA: Diagnosis not present

## 2013-11-25 DIAGNOSIS — F411 Generalized anxiety disorder: Secondary | ICD-10-CM | POA: Diagnosis not present

## 2013-11-25 DIAGNOSIS — F0391 Unspecified dementia with behavioral disturbance: Secondary | ICD-10-CM | POA: Diagnosis not present

## 2013-11-25 DIAGNOSIS — I2510 Atherosclerotic heart disease of native coronary artery without angina pectoris: Secondary | ICD-10-CM | POA: Diagnosis not present

## 2013-11-25 DIAGNOSIS — N3281 Overactive bladder: Secondary | ICD-10-CM

## 2013-11-25 DIAGNOSIS — F03918 Unspecified dementia, unspecified severity, with other behavioral disturbance: Secondary | ICD-10-CM | POA: Diagnosis not present

## 2013-11-25 DIAGNOSIS — I509 Heart failure, unspecified: Secondary | ICD-10-CM | POA: Diagnosis not present

## 2013-11-25 DIAGNOSIS — E785 Hyperlipidemia, unspecified: Secondary | ICD-10-CM | POA: Diagnosis not present

## 2013-11-25 MED ORDER — OXYCODONE-ACETAMINOPHEN 5-325 MG PO TABS: ORAL_TABLET | ORAL | Status: DC

## 2013-11-25 MED ORDER — FESOTERODINE FUMARATE ER 4 MG PO TB24: 4.0000 mg | ORAL_TABLET | Freq: Every day | ORAL | Status: DC

## 2013-11-25 MED ORDER — CITALOPRAM HYDROBROMIDE 40 MG PO TABS: ORAL_TABLET | ORAL | Status: DC

## 2013-11-25 MED ORDER — QUETIAPINE FUMARATE 25 MG PO TABS: 25.0000 mg | ORAL_TABLET | Freq: Two times a day (BID) | ORAL | Status: DC

## 2013-11-25 NOTE — Progress Notes (Signed)
Patient ID: [name], female   DOB: [birth year], 79 y.o.   MRN: [id number]    Chief Complaint  Patient presents with  . Follow-up    3 week follow up uti, OAB, mood   Allergies  Allergen Reactions  . Enalapril Swelling    Facial   HPI 79 y/o female pt here for follow up. She has completed her augmentin and denies any burning or pain with urination. She is not sure if she is [place] her [place]. Her grand-daughter is here and is not sure about her medications. She continues to have mood fluctuation and agitation episodes. Has urinary frequency but unable to tell if improved from before Needs refills on her pain pills  ROS No fever or chills No nausea or vomiting or abdominal pain Feels low and overwhelmed being alone No chest pain or dyspnea Rest of ROS negative  Past Medical History  Diagnosis Date  . Coronary artery disease     stents x 2, sees Dr. Halina AndreasSmith, [facility]  . Congestive heart failure     2012  . Myocardial infarction     "unsure of when"  . Hypertension   . Diabetes mellitus   . Shortness of breath   . Anemia   . Blood transfusion     when had hysterectomy  . Hypothyroidism     has had hx of treatment  . Urinary frequency   . Urinary tract infection     hx of  . GERD (gastroesophageal reflux disease)   . H/O hiatal hernia   . Arthritis   . Depression   . Spinal stenosis     with bone spurs and ruptured disc  . Cataract   . Blood transfusion without reported diagnosis   . Complications affecting other specified body systems, hypertension   . Other malaise and fatigue   . Dysuria   . Unspecified vitamin D deficiency   . Anxiety state, unspecified   . Other B-complex deficiencies   . Abnormality of gait   . Other and unspecified hyperlipidemia    Current Outpatient Prescriptions on File Prior to Visit  Medication Sig Dispense Refill  . aspirin EC 325 MG tablet Take 325 mg by mouth daily. Take 1 tablet daily.      . Calcium  Carbonate-Vitamin D (CALCIUM + D PO) Take 1 tablet by mouth daily.      . clonazePAM (KLONOPIN) 0.5 MG tablet Take one tablet by mouth three times daily for anxiety  90 tablet  3  . docusate sodium (COLACE) 100 MG capsule Take 200 mg by mouth 2 (two) times daily as needed. For stool softener.      . ferrous sulfate 325 (65 FE) MG tablet TAKE 1 TABLET BY MOUTH EVERY DAY FOR ANEMIA  30 tablet  3  . fish oil-omega-3 fatty acids 1000 MG capsule Take 1 g by mouth daily. Take 1 tablet by mouth with meals.      . hydrALAZINE (APRESOLINE) 25 MG tablet TAKE 1 TABLET 3 TIMES A DAY  90 tablet  5  . losartan-hydrochlorothiazide (HYZAAR) 50-12.5 MG per tablet Take 1 tablet by mouth daily.  90 tablet  3  . metFORMIN (GLUCOPHAGE) 500 MG tablet Take 1 tablet (500 mg total) by mouth daily with breakfast.  90 tablet  3  . metoprolol (LOPRESSOR) 100 MG tablet Take 100 mg by mouth 2 (two) times daily.      . nitroGLYCERIN (NITROSTAT) 0.4 MG SL tablet Place 1 tablet (0.4  mg total) under the tongue every 5 (five) minutes as needed. maximum, 3 tablets in 15 minutes.  25 tablet  5  . polyethylene glycol powder (GLYCOLAX/MIRALAX) powder Take 17 g by mouth daily as needed.  3350 g  1  . [DISCONTINUED] oxybutynin (DITROPAN-XL) 5 MG 24 hr tablet Take one tablet by mouth once daily for bladder.       No current facility-administered medications on file prior to visit.   Past Surgical History  Procedure Laterality Date  . Cataract extraction w/ intraocular lens  implant, bilateral    . Bladder suspension    . Coronary      stents x 2, stent implant placed9/21/06  . Back surgery      ruptured disc, lumbar area  . Appendectomy    . Cardiac catheterization    . Lumbar laminectomy/decompression microdiscectomy  12/21/2011    Procedure: LUMBAR LAMINECTOMY/DECOMPRESSION MICRODISCECTOMY 1 LEVEL;  Surgeon: Temple Pacini, MD;  Location: [facility];  Service: Neurosurgery;  Laterality: Bilateral;  Lumbar four-five decompressive  lumbar laminectomy; Right Lumbar four-five microdiscectomy  . Eye surgery    . Spine surgery    . Abdominal hysterectomy  1986    Physical exam BP 140/88  Pulse 75  Temp(Src) 99 F (37.2 C) (Oral)  Resp 10  Wt 200 lb (90.719 kg)  SpO2 96%  General- elderly female in no acute distress, obese Head- atraumatic, normocephalic Eyes- PERRLA, EOMI, no pallor, no icterus, no discharge Neck- no lymphadenopathy Cardiovascular- normal s1,s2, no murmurs/ rubs/ gallops Respiratory- bilateral clear to auscultation, no wheeze, no rhonchi, no crackles Abdomen- bowel sounds present, soft, non tender Musculoskeletal- able to move all 4 extremities, using a cane Neurological- no focal deficit Psychiatry- alert and oriented to person, place, calm here  Labs- CBC    Component Value Date/Time   WBC 7.0 10/07/2013 1640   WBC 7.9 06/08/2013 0853   WBC 7.9 06/08/2013 0853   WBC 6.2 08/10/2012 1151   RBC 4.78 10/07/2013 1640   RBC 4.46 06/08/2013 0853   RBC 4.40 06/08/2013 0853   RBC 4.71 08/10/2012 1151   HGB 14.4 10/07/2013 1640   HGB 14.0 08/10/2012 1151   HCT 42.2 10/07/2013 1640   HCT 43.0 08/10/2012 1151   PLT 181 10/07/2013 1640   MCV 88.3 10/07/2013 1640   MCV 91.4 08/10/2012 1151   MCH 30.1 10/07/2013 1640   MCH 30.3 06/08/2013 0853   MCH 30.5 06/08/2013 0853   MCH 29.7 08/10/2012 1151   MCHC 34.1 10/07/2013 1640   MCHC 33.1 06/08/2013 0853   MCHC 33.3 06/08/2013 0853   MCHC 32.6 08/10/2012 1151   RDW 13.5 10/07/2013 1640   RDW 14.3 06/08/2013 0853   RDW 14.3 06/08/2013 0853   LYMPHSABS 1.8 10/07/2013 1640   LYMPHSABS 1.4 06/08/2013 0853   LYMPHSABS 1.4 06/08/2013 0853   MONOABS 0.5 10/07/2013 1640   EOSABS 0.3 10/07/2013 1640   EOSABS 0.4 06/08/2013 0853   EOSABS 0.4 06/08/2013 0853   BASOSABS 0.0 10/07/2013 1640   BASOSABS 0.0 06/08/2013 0853   BASOSABS 0.0 06/08/2013 0853    CMP     Component Value Date/Time   NA 144 10/29/2013 0810   NA 139 10/07/2013 1640   K 4.0  10/29/2013 0810   CL 99 10/29/2013 0810   CO2 26 10/29/2013 0810   GLUCOSE 146* 10/29/2013 0810   GLUCOSE 102* 10/07/2013 1640   BUN 18 10/29/2013 0810   BUN 21 10/07/2013 1640   CREATININE 0.91 10/29/2013  0810   CALCIUM 9.5 10/29/2013 0810   PROT 6.3 06/08/2013 0853   PROT 6.9 08/10/2012 1255   ALBUMIN 3.7 08/10/2012 1255   AST 11 06/08/2013 0853   ALT 14 06/08/2013 0853   ALKPHOS 58 06/08/2013 0853   BILITOT 0.5 06/08/2013 0853   GFRNONAA 61 10/29/2013 0810   GFRAA 70 10/29/2013 0810    Assessment/plan  1. Type II or unspecified type diabetes mellitus without mention of complication, not stated as uncontrolled  continue metformin for now  2. Overactive bladder Script with refill on toviaz provided. Pt's son to call office regarding whether patient is taking this medication or not. If needed will increase dose of medication  3. Dementia with behavioral disturbance Will increase her seroquel to 25 mg bid for now. Concern for qtc prolongation with celexa, will decrease celexa to 20 mg daily x 1 week and then 20 mg every other day for 1 week and stop it. Also send genetic testing for evaluation on sesitivity to the current mood medications and pain med  4. Lumbar stenosis with neurogenic claudication Refill on percocet provided

## 2013-11-27 NOTE — Telephone Encounter (Signed)
Note was printed off and given to provider at time of visit

## 2013-12-14 ENCOUNTER — Other Ambulatory Visit: Payer: Self-pay | Admitting: *Deleted

## 2013-12-14 MED ORDER — METOPROLOL TARTRATE 100 MG PO TABS: 100.0000 mg | ORAL_TABLET | Freq: Two times a day (BID) | ORAL | Status: DC

## 2014-01-07 ENCOUNTER — Other Ambulatory Visit: Payer: Self-pay | Admitting: Nurse Practitioner

## 2014-01-07 ENCOUNTER — Other Ambulatory Visit: Payer: Self-pay | Admitting: Internal Medicine

## 2014-01-11 ENCOUNTER — Other Ambulatory Visit: Payer: Self-pay | Admitting: Internal Medicine

## 2014-01-12 NOTE — Telephone Encounter (Signed)
Left message on voicemail for patient to return call when available   

## 2014-02-02 ENCOUNTER — Other Ambulatory Visit: Payer: Self-pay | Admitting: Internal Medicine

## 2014-02-21 ENCOUNTER — Inpatient Hospital Stay (HOSPITAL_COMMUNITY)
Admission: EM | Admit: 2014-02-21 | Discharge: 2014-02-24 | DRG: 069 | Disposition: A | Discharge status: Skilled Nursing Facility | Payer: Medicare Other | Attending: Internal Medicine | Admitting: Internal Medicine

## 2014-02-21 ENCOUNTER — Encounter (HOSPITAL_COMMUNITY): Payer: Self-pay | Admitting: Emergency Medicine

## 2014-02-21 ENCOUNTER — Emergency Department (HOSPITAL_COMMUNITY): Payer: Medicare Other

## 2014-02-21 DIAGNOSIS — R0989 Other specified symptoms and signs involving the circulatory and respiratory systems: Secondary | ICD-10-CM | POA: Diagnosis not present

## 2014-02-21 DIAGNOSIS — R52 Pain, unspecified: Secondary | ICD-10-CM | POA: Diagnosis not present

## 2014-02-21 DIAGNOSIS — M129 Arthropathy, unspecified: Secondary | ICD-10-CM | POA: Diagnosis present

## 2014-02-21 DIAGNOSIS — F0391 Unspecified dementia with behavioral disturbance: Secondary | ICD-10-CM | POA: Diagnosis present

## 2014-02-21 DIAGNOSIS — R5381 Other malaise: Secondary | ICD-10-CM | POA: Diagnosis not present

## 2014-02-21 DIAGNOSIS — G451 Carotid artery syndrome (hemispheric): Secondary | ICD-10-CM

## 2014-02-21 DIAGNOSIS — Z8249 Family history of ischemic heart disease and other diseases of the circulatory system: Secondary | ICD-10-CM

## 2014-02-21 DIAGNOSIS — Z9181 History of falling: Secondary | ICD-10-CM

## 2014-02-21 DIAGNOSIS — N390 Urinary tract infection, site not specified: Secondary | ICD-10-CM

## 2014-02-21 DIAGNOSIS — F03918 Unspecified dementia, unspecified severity, with other behavioral disturbance: Secondary | ICD-10-CM | POA: Diagnosis not present

## 2014-02-21 DIAGNOSIS — Z9861 Coronary angioplasty status: Secondary | ICD-10-CM

## 2014-02-21 DIAGNOSIS — I959 Hypotension, unspecified: Secondary | ICD-10-CM | POA: Diagnosis present

## 2014-02-21 DIAGNOSIS — E039 Hypothyroidism, unspecified: Secondary | ICD-10-CM | POA: Diagnosis present

## 2014-02-21 DIAGNOSIS — I509 Heart failure, unspecified: Secondary | ICD-10-CM | POA: Diagnosis present

## 2014-02-21 DIAGNOSIS — N3000 Acute cystitis without hematuria: Secondary | ICD-10-CM | POA: Diagnosis not present

## 2014-02-21 DIAGNOSIS — R079 Chest pain, unspecified: Secondary | ICD-10-CM | POA: Diagnosis not present

## 2014-02-21 DIAGNOSIS — R269 Unspecified abnormalities of gait and mobility: Secondary | ICD-10-CM | POA: Diagnosis present

## 2014-02-21 DIAGNOSIS — I6350 Cerebral infarction due to unspecified occlusion or stenosis of unspecified cerebral artery: Secondary | ICD-10-CM

## 2014-02-21 DIAGNOSIS — K5900 Constipation, unspecified: Secondary | ICD-10-CM

## 2014-02-21 DIAGNOSIS — G459 Transient cerebral ischemic attack, unspecified: Secondary | ICD-10-CM

## 2014-02-21 DIAGNOSIS — I639 Cerebral infarction, unspecified: Secondary | ICD-10-CM | POA: Insufficient documentation

## 2014-02-21 DIAGNOSIS — M6281 Muscle weakness (generalized): Secondary | ICD-10-CM | POA: Diagnosis not present

## 2014-02-21 DIAGNOSIS — I5032 Chronic diastolic (congestive) heart failure: Secondary | ICD-10-CM | POA: Diagnosis not present

## 2014-02-21 DIAGNOSIS — I10 Essential (primary) hypertension: Secondary | ICD-10-CM

## 2014-02-21 DIAGNOSIS — R4789 Other speech disturbances: Secondary | ICD-10-CM

## 2014-02-21 DIAGNOSIS — Z7982 Long term (current) use of aspirin: Secondary | ICD-10-CM

## 2014-02-21 DIAGNOSIS — R404 Transient alteration of awareness: Secondary | ICD-10-CM | POA: Diagnosis not present

## 2014-02-21 DIAGNOSIS — E569 Vitamin deficiency, unspecified: Secondary | ICD-10-CM | POA: Diagnosis not present

## 2014-02-21 DIAGNOSIS — E785 Hyperlipidemia, unspecified: Secondary | ICD-10-CM | POA: Diagnosis not present

## 2014-02-21 DIAGNOSIS — I6789 Other cerebrovascular disease: Secondary | ICD-10-CM | POA: Diagnosis not present

## 2014-02-21 DIAGNOSIS — R488 Other symbolic dysfunctions: Secondary | ICD-10-CM | POA: Diagnosis not present

## 2014-02-21 DIAGNOSIS — R531 Weakness: Secondary | ICD-10-CM

## 2014-02-21 DIAGNOSIS — M48062 Spinal stenosis, lumbar region with neurogenic claudication: Secondary | ICD-10-CM

## 2014-02-21 DIAGNOSIS — F32A Depression, unspecified: Secondary | ICD-10-CM

## 2014-02-21 DIAGNOSIS — I252 Old myocardial infarction: Secondary | ICD-10-CM | POA: Diagnosis not present

## 2014-02-21 DIAGNOSIS — K219 Gastro-esophageal reflux disease without esophagitis: Secondary | ICD-10-CM | POA: Diagnosis present

## 2014-02-21 DIAGNOSIS — E559 Vitamin D deficiency, unspecified: Secondary | ICD-10-CM | POA: Diagnosis present

## 2014-02-21 DIAGNOSIS — F0390 Unspecified dementia without behavioral disturbance: Secondary | ICD-10-CM | POA: Diagnosis not present

## 2014-02-21 DIAGNOSIS — F3289 Other specified depressive episodes: Secondary | ICD-10-CM | POA: Diagnosis present

## 2014-02-21 DIAGNOSIS — A498 Other bacterial infections of unspecified site: Secondary | ICD-10-CM | POA: Diagnosis present

## 2014-02-21 DIAGNOSIS — I2583 Coronary atherosclerosis due to lipid rich plaque: Secondary | ICD-10-CM

## 2014-02-21 DIAGNOSIS — Z5189 Encounter for other specified aftercare: Secondary | ICD-10-CM | POA: Diagnosis not present

## 2014-02-21 DIAGNOSIS — Q245 Malformation of coronary vessels: Secondary | ICD-10-CM | POA: Diagnosis not present

## 2014-02-21 DIAGNOSIS — I2584 Coronary atherosclerosis due to calcified coronary lesion: Secondary | ICD-10-CM

## 2014-02-21 DIAGNOSIS — F329 Major depressive disorder, single episode, unspecified: Secondary | ICD-10-CM

## 2014-02-21 DIAGNOSIS — E119 Type 2 diabetes mellitus without complications: Secondary | ICD-10-CM

## 2014-02-21 DIAGNOSIS — F411 Generalized anxiety disorder: Secondary | ICD-10-CM | POA: Diagnosis not present

## 2014-02-21 DIAGNOSIS — E1159 Type 2 diabetes mellitus with other circulatory complications: Secondary | ICD-10-CM

## 2014-02-21 DIAGNOSIS — F02818 Dementia in other diseases classified elsewhere, unspecified severity, with other behavioral disturbance: Secondary | ICD-10-CM | POA: Diagnosis not present

## 2014-02-21 DIAGNOSIS — Z87891 Personal history of nicotine dependence: Secondary | ICD-10-CM | POA: Diagnosis not present

## 2014-02-21 DIAGNOSIS — I2510 Atherosclerotic heart disease of native coronary artery without angina pectoris: Secondary | ICD-10-CM

## 2014-02-21 DIAGNOSIS — F22 Delusional disorders: Secondary | ICD-10-CM

## 2014-02-21 DIAGNOSIS — G4700 Insomnia, unspecified: Secondary | ICD-10-CM | POA: Diagnosis not present

## 2014-02-21 DIAGNOSIS — E860 Dehydration: Secondary | ICD-10-CM | POA: Diagnosis present

## 2014-02-21 DIAGNOSIS — R29898 Other symptoms and signs involving the musculoskeletal system: Secondary | ICD-10-CM | POA: Diagnosis present

## 2014-02-21 DIAGNOSIS — R29818 Other symptoms and signs involving the nervous system: Secondary | ICD-10-CM | POA: Diagnosis not present

## 2014-02-21 DIAGNOSIS — M949 Disorder of cartilage, unspecified: Secondary | ICD-10-CM

## 2014-02-21 DIAGNOSIS — M4800 Spinal stenosis, site unspecified: Secondary | ICD-10-CM | POA: Diagnosis not present

## 2014-02-21 DIAGNOSIS — I517 Cardiomegaly: Secondary | ICD-10-CM | POA: Diagnosis not present

## 2014-02-21 DIAGNOSIS — R413 Other amnesia: Secondary | ICD-10-CM

## 2014-02-21 DIAGNOSIS — R279 Unspecified lack of coordination: Secondary | ICD-10-CM | POA: Diagnosis not present

## 2014-02-21 DIAGNOSIS — Z981 Arthrodesis status: Secondary | ICD-10-CM | POA: Diagnosis not present

## 2014-02-21 DIAGNOSIS — N3281 Overactive bladder: Secondary | ICD-10-CM

## 2014-02-21 DIAGNOSIS — Z0000 Encounter for general adult medical examination without abnormal findings: Secondary | ICD-10-CM

## 2014-02-21 DIAGNOSIS — Z79899 Other long term (current) drug therapy: Secondary | ICD-10-CM

## 2014-02-21 DIAGNOSIS — F0281 Dementia in other diseases classified elsewhere with behavioral disturbance: Secondary | ICD-10-CM | POA: Diagnosis not present

## 2014-02-21 LAB — GLUCOSE, CAPILLARY
GLUCOSE-CAPILLARY: 201 mg/dL — AB (ref 70–99)
Glucose-Capillary: 148 mg/dL — ABNORMAL HIGH (ref 70–99)

## 2014-02-21 LAB — URINE MICROSCOPIC-ADD ON

## 2014-02-21 LAB — I-STAT TROPONIN, ED: Troponin i, poc: 0 ng/mL (ref 0.00–0.08)

## 2014-02-21 LAB — I-STAT CHEM 8, ED
BUN: 29 mg/dL — AB (ref 6–23)
CREATININE: 1.3 mg/dL — AB (ref 0.50–1.10)
Calcium, Ion: 1.33 mmol/L — ABNORMAL HIGH (ref 1.13–1.30)
Chloride: 103 mEq/L (ref 96–112)
GLUCOSE: 196 mg/dL — AB (ref 70–99)
HCT: 43 % (ref 36.0–46.0)
HEMOGLOBIN: 14.6 g/dL (ref 12.0–15.0)
Potassium: 4 mEq/L (ref 3.7–5.3)
Sodium: 140 mEq/L (ref 137–147)
TCO2: 27 mmol/L (ref 0–100)

## 2014-02-21 LAB — DIFFERENTIAL
BASOS ABS: 0 10*3/uL (ref 0.0–0.1)
Basophils Relative: 0 % (ref 0–1)
EOS PCT: 3 % (ref 0–5)
Eosinophils Absolute: 0.3 10*3/uL (ref 0.0–0.7)
LYMPHS ABS: 1.6 10*3/uL (ref 0.7–4.0)
Lymphocytes Relative: 21 % (ref 12–46)
Monocytes Absolute: 0.5 10*3/uL (ref 0.1–1.0)
Monocytes Relative: 6 % (ref 3–12)
Neutro Abs: 5.4 10*3/uL (ref 1.7–7.7)
Neutrophils Relative %: 70 % (ref 43–77)

## 2014-02-21 LAB — URINALYSIS, ROUTINE W REFLEX MICROSCOPIC
BILIRUBIN URINE: NEGATIVE
Glucose, UA: NEGATIVE mg/dL
Hgb urine dipstick: NEGATIVE
Ketones, ur: NEGATIVE mg/dL
Nitrite: POSITIVE — AB
Protein, ur: NEGATIVE mg/dL
Specific Gravity, Urine: 1.023 (ref 1.005–1.030)
UROBILINOGEN UA: 0.2 mg/dL (ref 0.0–1.0)
pH: 5 (ref 5.0–8.0)

## 2014-02-21 LAB — COMPREHENSIVE METABOLIC PANEL
ALK PHOS: 64 U/L (ref 39–117)
ALT: 22 U/L (ref 0–35)
AST: 15 U/L (ref 0–37)
Albumin: 3.4 g/dL — ABNORMAL LOW (ref 3.5–5.2)
Anion gap: 16 — ABNORMAL HIGH (ref 5–15)
BILIRUBIN TOTAL: 0.5 mg/dL (ref 0.3–1.2)
BUN: 25 mg/dL — ABNORMAL HIGH (ref 6–23)
CALCIUM: 10.8 mg/dL — AB (ref 8.4–10.5)
CO2: 27 meq/L (ref 19–32)
Chloride: 100 mEq/L (ref 96–112)
Creatinine, Ser: 1.13 mg/dL — ABNORMAL HIGH (ref 0.50–1.10)
GFR, EST AFRICAN AMERICAN: 53 mL/min — AB (ref >90)
GFR, EST NON AFRICAN AMERICAN: 46 mL/min — AB (ref >90)
GLUCOSE: 194 mg/dL — AB (ref 70–99)
POTASSIUM: 4.1 meq/L (ref 3.7–5.3)
Sodium: 143 mEq/L (ref 137–147)
Total Protein: 6.7 g/dL (ref 6.0–8.3)

## 2014-02-21 LAB — PROTIME-INR
INR: 0.92 (ref 0.00–1.49)
Prothrombin Time: 12.4 seconds (ref 11.6–15.2)

## 2014-02-21 LAB — CBC
HCT: 40.7 % (ref 36.0–46.0)
Hemoglobin: 14 g/dL (ref 12.0–15.0)
MCH: 31.3 pg (ref 26.0–34.0)
MCHC: 34.4 g/dL (ref 30.0–36.0)
MCV: 91.1 fL (ref 78.0–100.0)
Platelets: 173 10*3/uL (ref 150–400)
RBC: 4.47 MIL/uL (ref 3.87–5.11)
RDW: 14.2 % (ref 11.5–15.5)
WBC: 7.8 10*3/uL (ref 4.0–10.5)

## 2014-02-21 LAB — APTT: APTT: 28 s (ref 24–37)

## 2014-02-21 LAB — RAPID URINE DRUG SCREEN, HOSP PERFORMED
Amphetamines: NOT DETECTED
BARBITURATES: NOT DETECTED
Benzodiazepines: POSITIVE — AB
COCAINE: NOT DETECTED
Opiates: NOT DETECTED
TETRAHYDROCANNABINOL: NOT DETECTED

## 2014-02-21 LAB — ETHANOL: Alcohol, Ethyl (B): <11 mg/dL (ref 0–11)

## 2014-02-21 MED ORDER — INSULIN ASPART 100 UNIT/ML ~~LOC~~ SOLN
0.0000 [IU] | Freq: Three times a day (TID) | SUBCUTANEOUS | Status: DC
  Administered 2014-02-22: 2 [IU] via SUBCUTANEOUS
  Administered 2014-02-22: 3 [IU] via SUBCUTANEOUS
  Administered 2014-02-22: 2 [IU] via SUBCUTANEOUS
  Administered 2014-02-23: 08:00:00 via SUBCUTANEOUS
  Administered 2014-02-23: 2 [IU] via SUBCUTANEOUS
  Administered 2014-02-23: 3 [IU] via SUBCUTANEOUS
  Administered 2014-02-24 (×2): 2 [IU] via SUBCUTANEOUS

## 2014-02-21 MED ORDER — DEXTROSE 5 % IV SOLN
1.0000 g | INTRAVENOUS | Status: DC
  Administered 2014-02-21 – 2014-02-22 (×2): 1 g via INTRAVENOUS
  Filled 2014-02-21 (×3): qty 10

## 2014-02-21 MED ORDER — FERROUS SULFATE 325 (65 FE) MG PO TABS
325.0000 mg | ORAL_TABLET | Freq: Every day | ORAL | Status: DC
  Administered 2014-02-22 – 2014-02-24 (×3): 325 mg via ORAL
  Filled 2014-02-21 (×4): qty 1

## 2014-02-21 MED ORDER — ASPIRIN 325 MG PO TABS
325.0000 mg | ORAL_TABLET | Freq: Every day | ORAL | Status: DC
  Administered 2014-02-21 – 2014-02-22 (×2): 325 mg via ORAL
  Filled 2014-02-21 (×2): qty 1

## 2014-02-21 MED ORDER — SODIUM CHLORIDE 0.9 % IV BOLUS (SEPSIS)
250.0000 mL | Freq: Once | INTRAVENOUS | Status: AC
  Administered 2014-02-21: 250 mL via INTRAVENOUS

## 2014-02-21 MED ORDER — CLONAZEPAM 0.5 MG PO TABS
0.5000 mg | ORAL_TABLET | Freq: Two times a day (BID) | ORAL | Status: DC | PRN
  Administered 2014-02-22 – 2014-02-24 (×3): 0.5 mg via ORAL
  Filled 2014-02-21 (×3): qty 1

## 2014-02-21 MED ORDER — POLYETHYLENE GLYCOL 3350 17 GM/SCOOP PO POWD
17.0000 g | Freq: Every day | ORAL | Status: DC | PRN
  Filled 2014-02-21: qty 255

## 2014-02-21 MED ORDER — SENNOSIDES-DOCUSATE SODIUM 8.6-50 MG PO TABS
1.0000 | ORAL_TABLET | Freq: Two times a day (BID) | ORAL | Status: DC
Start: 2014-02-21 — End: 2014-02-24
  Administered 2014-02-21 – 2014-02-24 (×6): 1 via ORAL
  Filled 2014-02-21 (×6): qty 1

## 2014-02-21 MED ORDER — STROKE: EARLY STAGES OF RECOVERY BOOK
Freq: Once | Status: AC
  Administered 2014-02-21: 19:00:00
  Filled 2014-02-21: qty 1

## 2014-02-21 MED ORDER — QUETIAPINE FUMARATE 25 MG PO TABS
25.0000 mg | ORAL_TABLET | Freq: Two times a day (BID) | ORAL | Status: DC
  Administered 2014-02-21 – 2014-02-24 (×6): 25 mg via ORAL
  Filled 2014-02-21 (×6): qty 1

## 2014-02-21 MED ORDER — SENNOSIDES-DOCUSATE SODIUM 8.6-50 MG PO TABS: 1.0000 | ORAL_TABLET | Freq: Every evening | ORAL | Status: DC | PRN

## 2014-02-21 MED ORDER — ENOXAPARIN SODIUM 30 MG/0.3ML ~~LOC~~ SOLN
30.0000 mg | SUBCUTANEOUS | Status: DC
  Administered 2014-02-21 – 2014-02-23 (×3): 30 mg via SUBCUTANEOUS
  Filled 2014-02-21 (×3): qty 0.3

## 2014-02-21 MED ORDER — METOPROLOL TARTRATE 100 MG PO TABS
100.0000 mg | ORAL_TABLET | Freq: Two times a day (BID) | ORAL | Status: DC
  Administered 2014-02-22 – 2014-02-24 (×5): 100 mg via ORAL
  Filled 2014-02-21 (×2): qty 1
  Filled 2014-02-21: qty 2
  Filled 2014-02-21 (×3): qty 1
  Filled 2014-02-21: qty 2

## 2014-02-21 MED ORDER — ASPIRIN 300 MG RE SUPP
300.0000 mg | Freq: Every day | RECTAL | Status: DC
Start: 2014-02-21 — End: 2014-02-23

## 2014-02-21 MED ORDER — FESOTERODINE FUMARATE ER 4 MG PO TB24
4.0000 mg | ORAL_TABLET | Freq: Every day | ORAL | Status: DC
  Administered 2014-02-22 – 2014-02-24 (×3): 4 mg via ORAL
  Filled 2014-02-21 (×4): qty 1

## 2014-02-21 MED ORDER — ASPIRIN EC 325 MG PO TBEC
325.0000 mg | DELAYED_RELEASE_TABLET | Freq: Every day | ORAL | Status: DC
  Administered 2014-02-23: 325 mg via ORAL
  Filled 2014-02-21 (×2): qty 1

## 2014-02-21 MED ORDER — POLYETHYLENE GLYCOL 3350 17 G PO PACK: 17.0000 g | PACK | Freq: Every day | ORAL | Status: DC | PRN

## 2014-02-21 MED ORDER — SODIUM CHLORIDE 0.9 % IV BOLUS (SEPSIS)
250.0000 mL | Freq: Once | INTRAVENOUS | Status: AC
  Administered 2014-02-21: 17:00:00 via INTRAVENOUS

## 2014-02-21 MED ORDER — OXYCODONE-ACETAMINOPHEN 5-325 MG PO TABS: 1.0000 | ORAL_TABLET | Freq: Four times a day (QID) | ORAL | Status: DC | PRN

## 2014-02-21 NOTE — ED Notes (Addendum)
Pt sts a few months ago she was falling a lot and had hit her head a couple of times. Pt sts that her left leg has felt "wobbling" the past month.

## 2014-02-21 NOTE — Progress Notes (Signed)
Pt's BP read 130/54, has scheduled metoprolol 100mg  at 2200, Lenny Pastelom Callahan NP (on call) paged and notified, advised to hold dose for tonight, same explained to pt, will however continue to monitor. Ob[place]-Asidi, Dalyah Pla Efe

## 2014-02-21 NOTE — ED Notes (Signed)
Pt back in room from CT 

## 2014-02-21 NOTE — Consult Note (Signed)
Referring Physician: Criss AlvineGoldston    Chief Complaint: Left sided weakness  HPI: Abigail Brooks is an 79 y.o. female who was at home speaking with family on the phone.  Patient had acute onset slurred speech and then dropped the phone.  Family rushed to the house and noted patient to have left sided weakness and slurred speech.  EMS ws called at that time.  Patient did have some improvement while in transit.  Initial NIHSS of 1.   Patient reports that she has been feeling that her left leg has been floppy for about a month but has been ambulatory.  Also reports that she has been having a lot of falls.      Date last known well: Date: 02/21/2014 Time last known well: Time: 12:45 tPA Given: No: Improving symptoms  Past Medical History  Diagnosis Date  . Coronary artery disease     stents x 2, sees Dr. Halina AndreasSmith, [facility]  . Congestive heart failure     2012  . Myocardial infarction     "unsure of when"  . Hypertension   . Diabetes mellitus   . Shortness of breath   . Anemia   . Blood transfusion     when had hysterectomy  . Hypothyroidism     has had hx of treatment  . Urinary frequency   . Urinary tract infection     hx of  . GERD (gastroesophageal reflux disease)   . H/O hiatal hernia   . Arthritis   . Depression   . Spinal stenosis     with bone spurs and ruptured disc  . Cataract   . Blood transfusion without reported diagnosis   . Complications affecting other specified body systems, hypertension   . Other malaise and fatigue   . Dysuria   . Unspecified vitamin D deficiency   . Anxiety state, unspecified   . Other B-complex deficiencies   . Abnormality of gait   . Other and unspecified hyperlipidemia     Past Surgical History  Procedure Laterality Date  . Cataract extraction w/ intraocular lens  implant, bilateral    . Bladder suspension    . Coronary      stents x 2, stent implant placed9/21/06  . Back surgery      ruptured disc, lumbar area  . Appendectomy     . Cardiac catheterization    . Lumbar laminectomy/decompression microdiscectomy  12/21/2011    Procedure: LUMBAR LAMINECTOMY/DECOMPRESSION MICRODISCECTOMY 1 LEVEL;  Surgeon: Temple PaciniHenry A Pool, MD;  Location: [facility];  Service: Neurosurgery;  Laterality: Bilateral;  Lumbar four-five decompressive lumbar laminectomy; Right Lumbar four-five microdiscectomy  . Eye surgery    . Spine surgery    . Abdominal hysterectomy  1986    Family History  Problem Relation Age of Onset  . Anesthesia problems Sister   . Anesthesia problems Son   . Heart disease Mother    Social History:  reports that she has quit smoking. Her smoking use included Cigarettes. She smoked 0.00 packs per day. Her smokeless tobacco use includes Snuff. She reports that she does not drink alcohol or use illicit drugs.  Allergies:  Allergies  Allergen Reactions  . Enalapril Swelling    Facial swelling  . Morphine And Related Other (See Comments)    Makes her crazy    Medications: I have reviewed the patient's current medications. Prior to Admission:  Current outpatient prescriptions:aspirin EC 325 MG tablet, Take 325 mg by mouth daily. , Disp: ,  Rfl: ;  Calcium Carbonate-Vitamin D (CALCIUM + D PO), Take 1 tablet by mouth daily. Calcium 315 mg,Vitamin D3 250 I.U., Disp: , Rfl: ;  clonazePAM (KLONOPIN) 0.5 MG tablet, Take 0.5 mg by mouth 3 (three) times daily., Disp: , Rfl: ;  ferrous sulfate 325 (65 FE) MG tablet, Take 325 mg by mouth daily with breakfast., Disp: , Rfl:  fesoterodine (TOVIAZ) 4 MG TB24 tablet, Take 4 mg by mouth daily., Disp: , Rfl: ;  fish oil-omega-3 fatty acids 1000 MG capsule, Take 1 g by mouth daily. , Disp: , Rfl: ;  hydrALAZINE (APRESOLINE) 25 MG tablet, Take 25 mg by mouth 3 (three) times daily., Disp: , Rfl: ;  losartan-hydrochlorothiazide (HYZAAR) 50-12.5 MG per tablet, Take 1 tablet by mouth daily., Disp: 90 tablet, Rfl: 3 metFORMIN (GLUCOPHAGE) 500 MG tablet, Take 1 tablet (500 mg total) by mouth daily  with breakfast., Disp: 90 tablet, Rfl: 3;  metoprolol (LOPRESSOR) 100 MG tablet, Take 1 tablet (100 mg total) by mouth 2 (two) times daily., Disp: 60 tablet, Rfl: 5;  nitroGLYCERIN (NITROSTAT) 0.4 MG SL tablet, Place 0.4 mg under the tongue every 5 (five) minutes as needed for chest pain. Max 3 tablets in 15 minutes, Disp: , Rfl:  oxyCODONE-acetaminophen (PERCOCET/ROXICET) 5-325 MG per tablet, Take 1-2 tablets by mouth every 6 (six) hours as needed for severe pain (pain)., Disp: , Rfl: ;  polyethylene glycol (MIRALAX / GLYCOLAX) packet, Take 17 g by mouth daily as needed (pain)., Disp: , Rfl: ;  polyethylene glycol powder (GLYCOLAX/MIRALAX) powder, Take 17 g by mouth daily as needed., Disp: 3350 g, Rfl: 1 QUEtiapine (SEROQUEL) 25 MG tablet, Take 1 tablet (25 mg total) by mouth 2 (two) times daily. Take 1 tablet twice daily, Disp: 60 tablet, Rfl: 3;  senna-docusate (SENNA S) 8.6-50 MG per tablet, Take 1 tablet by mouth 2 (two) times daily., Disp: , Rfl: ;  [DISCONTINUED] oxybutynin (DITROPAN-XL) 5 MG 24 hr tablet, Take one tablet by mouth once daily for bladder., Disp: , Rfl:   ROS: History obtained from the patient  General ROS: negative for - chills, fatigue, fever, night sweats, weight gain or weight loss Psychological ROS: negative for - behavioral disorder, hallucinations, memory difficulties, mood swings or suicidal ideation Ophthalmic ROS: negative for - blurry vision, double vision, eye pain or loss of vision ENT ROS: negative for - epistaxis, nasal discharge, oral lesions, sore throat, tinnitus or vertigo Allergy and Immunology ROS: negative for - hives or itchy/watery eyes Hematological and Lymphatic ROS: negative for - bleeding problems, bruising or swollen lymph nodes Endocrine ROS: negative for - galactorrhea, hair pattern changes, polydipsia/polyuria or temperature intolerance Respiratory ROS: negative for - cough, hemoptysis, shortness of breath or wheezing Cardiovascular ROS: negative  for - chest pain, dyspnea on exertion, edema or irregular heartbeat Gastrointestinal ROS: negative for - abdominal pain, diarrhea, hematemesis, nausea/vomiting or stool incontinence Genito-Urinary ROS: negative for - dysuria, hematuria, incontinence or urinary frequency/urgency Musculoskeletal ROS: negative for - joint swelling or muscular weakness Neurological ROS: as noted in HPI Dermatological ROS: negative for rash and skin lesion changes  Physical Examination: Blood pressure 123/43, pulse 69, temperature 98.6 F (37 C), resp. rate 22, height 5' (1.524 m), weight 84.701 kg (186 lb 11.7 oz), SpO2 91.00%.  Neurologic Examination: Mental Status: Alert, oriented, thought content appropriate.  Speech fluent without evidence of aphasia.  Able to follow 3 step commands without difficulty. Cranial Nerves: II: Discs flat bilaterally; Visual fields grossly normal, pupils equal, round, reactive to  light and accommodation III,IV, VI: ptosis not present, extra-ocular motions intact bilaterally V,VII: smile symmetric, facial light touch sensation normal bilaterally VIII: hearing normal bilaterally IX,X: gag reflex present XI: bilateral shoulder shrug XII: midline tongue extension Motor: Right : Upper extremity   5/5    Left:     Upper extremity   5/5  Lower extremity   5/5     Lower extremity   5/5 Tone and bulk:normal tone throughout; no atrophy noted Sensory: Pinprick and light touch intact throughout, bilaterally Deep Tendon Reflexes: 2+ and symmetric throughout Plantars: Right: downgoing   Left: downgoing Cerebellar: normal finger-to-nose, normal rapid alternating movements and normal heel-to-shin test Gait: normal gait and station CV: pulses palpable throughout     Laboratory Studies:  Basic Metabolic Panel:  Recent Labs Lab 02/21/14 1516 02/21/14 1528  NA 143 140  K 4.1 4.0  CL 100 103  CO2 27  --   GLUCOSE 194* 196*  BUN 25* 29*  CREATININE 1.13* 1.30*  CALCIUM 10.8*   --     Liver Function Tests:  Recent Labs Lab 02/21/14 1516  AST 15  ALT 22  ALKPHOS 64  BILITOT 0.5  PROT 6.7  ALBUMIN 3.4*   No results found for this basename: LIPASE, AMYLASE,  in the last 168 hours No results found for this basename: AMMONIA,  in the last 168 hours  CBC:  Recent Labs Lab 02/21/14 1516 02/21/14 1528  WBC 7.8  --   NEUTROABS 5.4  --   HGB 14.0 14.6  HCT 40.7 43.0  MCV 91.1  --   PLT 173  --     Cardiac Enzymes: No results found for this basename: CKTOTAL, CKMB, CKMBINDEX, TROPONINI,  in the last 168 hours  BNP: No components found with this basename: POCBNP,   CBG: No results found for this basename: GLUCAP,  in the last 168 hours  Microbiology: Results for orders placed in visit on 11/10/13  URINE CULTURE     Status: None   Collection Time    11/10/13  4:06 PM      Result Value Ref Range Status   Urine Culture, Routine Final report   Final   Result 1 Comment   Final   Comment: Mixed urogenital flora     50,000-100,000 colony forming units per mL    Coagulation Studies:  Recent Labs  02/21/14 1516  LABPROT 12.4  INR 0.92    Urinalysis: No results found for this basename: COLORURINE, APPERANCEUR, LABSPEC, PHURINE, GLUCOSEU, HGBUR, BILIRUBINUR, KETONESUR, PROTEINUR, UROBILINOGEN, NITRITE, LEUKOCYTESUR,  in the last 168 hours  Lipid Panel:    Component Value Date/Time   TRIG 248* 06/08/2013 0853   TRIG 233* 06/08/2013 0853   HDL 50 06/08/2013 0853   HDL 47 06/08/2013 0853   CHOLHDL 4.1 06/08/2013 0853   CHOLHDL 4.3 06/08/2013 0853   LDLCALC 105* 06/08/2013 0853   LDLCALC 107* 06/08/2013 0853    HgbA1C:  Lab Results  Component Value Date   HGBA1C 6.8* 10/29/2013    Urine Drug Screen:   No results found for this basename: labopia, cocainscrnur, labbenz, amphetmu, thcu, labbarb    Alcohol Level:  Recent Labs Lab 02/21/14 1516  ETH <11    Other results: EKG: normal sinus rhythm at 70 bpm.  Imaging: Ct Head Wo  Contrast  02/21/2014   CLINICAL DATA:  Left-sided weakness  EXAM: CT HEAD WITHOUT CONTRAST  TECHNIQUE: Contiguous axial images were obtained from the base of the skull through the  vertex without intravenous contrast.  COMPARISON:  10/07/2013  FINDINGS: No evidence of parenchymal hemorrhage or extra-axial fluid collection. No mass lesion, mass effect, or midline shift.  No CT evidence of acute infarction.  Subcortical white matter and periventricular small vessel ischemic changes. Intracranial atherosclerosis.  Cerebral volume is within normal limits.  No ventriculomegaly.  The visualized paranasal sinuses are essentially clear. Partial opacification of the left mastoid air cells.  No evidence of calvarial fracture.  IMPRESSION: No evidence of acute intracranial abnormality.  Small vessel ischemic changes with intracranial atherosclerosis.   Electronically Signed   By: Charline Bills M.D.   On: 02/21/2014 15:19    Assessment: 79 y.o. female presenting with a episode of left sided hemiparesis and slurred speech that is improving.  Patient now only with some left lower extremity weakness.  NIHSS of 1.  Have discussed risks and benefits of tPA with family and at this time tPA not to be administered.  Will continue to follow NIHSS and vitals for worsening.  This may further influence this decision.  Patient on ASA at home.  Stroke Risk Factors - diabetes mellitus and hypertension  Plan: 1. HgbA1c, fasting lipid panel 2. MRI, MRA  of the brain without contrast 3. PT consult, OT consult, Speech consult 4. Echocardiogram 5. Carotid dopplers 6. Prophylactic therapy-Antiplatelet med: Aspirin - dose 325mg  daily 7. Risk factor modification 8. Telemetry monitoring 9. Frequent neuro checks 10. 250cc IV bolus.  BP low and patient appears dehydrated. Repeat X 1.  Case discussed with Dr. Ilene Qua, MD [facility] [phone] 02/21/2014, 4:14 PM

## 2014-02-21 NOTE — ED Provider Notes (Signed)
CSN: [id number]     Arrival date & time 02/21/14  1501 History   First MD Initiated Contact with Patient 02/21/14 1530     Chief Complaint  Patient presents with  . Code Stroke    An emergency department physician performed an initial assessment on this suspected stroke patient at 1455. (Consider location/radiation/quality/duration/timing/severity/associated sxs/prior Treatment) HPI 79 year old female presents with acute trouble speaking and left-sided weakness. Her son relates that he was on the phone with her when he noticed she is having trouble speaking and she dropped the phone. She was last seen normal about 2-1/2 hours prior to arrival at church. The son was talking to her after that when this occurred. Patient does not remember any of this happening. When EMS arrived the patient was unable to lift her arm up and had left leg weakness as well. No known falls or injuries. No known prior illnesses. The patient symptoms have been improving since arrival to the ER.  Past Medical History  Diagnosis Date  . Coronary artery disease     stents x 2, sees Dr. [facility]  . Congestive heart failure     2012  . Myocardial infarction     "unsure of when"  . Hypertension   . Diabetes mellitus   . Shortness of breath   . Anemia   . Blood transfusion     when had hysterectomy  . Hypothyroidism     has had hx of treatment  . Urinary frequency   . Urinary tract infection     hx of  . GERD (gastroesophageal reflux disease)   . H/O hiatal hernia   . Arthritis   . Depression   . Spinal stenosis     with bone spurs and ruptured disc  . Cataract   . Blood transfusion without reported diagnosis   . Complications affecting other specified body systems, hypertension   . Other malaise and fatigue   . Dysuria   . Unspecified vitamin D deficiency   . Anxiety state, unspecified   . Other B-complex deficiencies   . Abnormality of gait   . Other and unspecified hyperlipidemia     Past Surgical History  Procedure Laterality Date  . Cataract extraction w/ intraocular lens  implant, bilateral    . Bladder suspension    . Coronary      stents x 2, stent implant placed9/21/06  . Back surgery      ruptured disc, lumbar area  . Appendectomy    . Cardiac catheterization    . Lumbar laminectomy/decompression microdiscectomy  12/21/2011    Procedure: LUMBAR LAMINECTOMY/DECOMPRESSION MICRODISCECTOMY 1 LEVEL;  Surgeon: Temple Pacini, MD;  Location: [facility];  Service: Neurosurgery;  Laterality: Bilateral;  Lumbar four-five decompressive lumbar laminectomy; Right Lumbar four-five microdiscectomy  . Eye surgery    . Spine surgery    . Abdominal hysterectomy  1986   Family History  Problem Relation Age of Onset  . Anesthesia problems Sister   . Anesthesia problems Son   . Heart disease Mother    History  Substance Use Topics  . Smoking status: Former Smoker    Types: Cigarettes  . Smokeless tobacco: Current User    Types: Snuff     Comment: 40 years ago  . Alcohol Use: No   OB History   Grav Para Term Preterm Abortions TAB SAB Ect Mult Living                 Review of  Systems  Constitutional: Negative for fever.  Genitourinary: Negative for dysuria.  Neurological: Positive for speech difficulty and weakness. Negative for headaches.  All other systems reviewed and are negative.     Allergies  Enalapril and Morphine and related  Home Medications   Prior to Admission medications   Medication Sig Start Date End Date Taking? Authorizing Provider  aspirin EC 325 MG tablet Take 325 mg by mouth daily. Take 1 tablet daily.    Historical Provider, MD  Calcium Carbonate-Vitamin D (CALCIUM + D PO) Take 1 tablet by mouth daily.    Historical Provider, MD  citalopram (CELEXA) 40 MG tablet TAKE half a tablet every day for 1 week and then half a tablet every other day for a week following this and then stop it. 11/25/13   Mahima Glade LloydPandey, MD  clonazePAM (KLONOPIN)  0.5 MG tablet TAKE 1 TABLET BY MOUTH 3 TIMES A DAY FOR ANXIETY    Tiffany L Reed, DO  docusate sodium (COLACE) 100 MG capsule Take 200 mg by mouth 2 (two) times daily as needed. For stool softener.    Historical Provider, MD  ferrous sulfate 325 (65 FE) MG tablet TAKE 1 TABLET BY MOUTH EVERY DAY FOR ANEMIA    Mahima Pandey, MD  fish oil-omega-3 fatty acids 1000 MG capsule Take 1 g by mouth daily. Take 1 tablet by mouth with meals.    Historical Provider, MD  hydrALAZINE (APRESOLINE) 25 MG tablet TAKE 1 TABLET 3 TIMES A DAY    Mahima Pandey, MD  losartan-hydrochlorothiazide (HYZAAR) 50-12.5 MG per tablet Take 1 tablet by mouth daily. 02/17/13   Oneal GroutMahima Pandey, MD  metFORMIN (GLUCOPHAGE) 500 MG tablet Take 1 tablet (500 mg total) by mouth daily with breakfast. 11/04/13   Oneal GroutMahima Pandey, MD  metoprolol (LOPRESSOR) 100 MG tablet Take 1 tablet (100 mg total) by mouth 2 (two) times daily. 12/14/13   Tiffany L Reed, DO  nitroGLYCERIN (NITROSTAT) 0.4 MG SL tablet Place 1 tablet (0.4 mg total) under the tongue every 5 (five) minutes as needed. maximum, 3 tablets in 15 minutes. 09/14/13   Lyn RecordsHenry W Smith III, MD  polyethylene glycol powder (GLYCOLAX/MIRALAX) powder Take 17 g by mouth daily as needed. 06/23/13   Oneal GroutMahima Pandey, MD  QUEtiapine (SEROQUEL) 25 MG tablet Take 1 tablet (25 mg total) by mouth 2 (two) times daily. Take 1 tablet twice daily 11/25/13   Oneal GroutMahima Pandey, MD  QUEtiapine (SEROQUEL) 25 MG tablet TAKE 1 TABLET TWICE DAILY    Mahima Pandey, MD  TOVIAZ 4 MG TB24 tablet TAKE 1 TABLET (4 MG TOTAL) BY MOUTH DAILY.    Mahima Glade LloydPandey, MD   BP 123/43  Pulse 69  Temp(Src) 98.6 F (37 C)  Resp 22  Ht 5' (1.524 m)  Wt 186 lb 11.7 oz (84.701 kg)  BMI 36.47 kg/m2  SpO2 91% Physical Exam  Nursing note and vitals reviewed. Constitutional: She is oriented to person, place, and time. She appears well-developed and well-nourished.  HENT:  Head: Normocephalic and atraumatic.  Right Ear: External ear normal.  Left  Ear: External ear normal.  Nose: Nose normal.  Eyes: EOM are normal. Pupils are equal, round, and reactive to light. Right eye exhibits no discharge. Left eye exhibits no discharge.  Cardiovascular: Normal rate, regular rhythm and normal heart sounds.   Pulmonary/Chest: Effort normal and breath sounds normal.  Abdominal: Soft. There is no tenderness.  Neurological: She is alert and oriented to person, place, and time.  CN 2-12 grossly intact.  Normal upper extremity strength bilaterally. Mild left leg weakness and drift. Normal RLE strength. Alert and oriented.  Skin: Skin is warm and dry.    ED Course  Procedures (including critical care time) Labs Review Labs Reviewed  I-STAT CHEM 8, ED - Abnormal; Notable for the following:    BUN 29 (*)    Creatinine, Ser 1.30 (*)    Glucose, Bld 196 (*)    Calcium, Ion 1.33 (*)    All other components within normal limits  PROTIME-INR  APTT  CBC  DIFFERENTIAL  ETHANOL  COMPREHENSIVE METABOLIC PANEL  URINE RAPID DRUG SCREEN (HOSP PERFORMED)  URINALYSIS, ROUTINE W REFLEX MICROSCOPIC  I-STAT TROPOININ, ED  I-STAT TROPOININ, ED    Imaging Review Ct Head Wo Contrast  02/21/2014   CLINICAL DATA:  Left-sided weakness  EXAM: CT HEAD WITHOUT CONTRAST  TECHNIQUE: Contiguous axial images were obtained from the base of the skull through the vertex without intravenous contrast.  COMPARISON:  10/07/2013  FINDINGS: No evidence of parenchymal hemorrhage or extra-axial fluid collection. No mass lesion, mass effect, or midline shift.  No CT evidence of acute infarction.  Subcortical white matter and periventricular small vessel ischemic changes. Intracranial atherosclerosis.  Cerebral volume is within normal limits.  No ventriculomegaly.  The visualized paranasal sinuses are essentially clear. Partial opacification of the left mastoid air cells.  No evidence of calvarial fracture.  IMPRESSION: No evidence of acute intracranial abnormality.  Small vessel ischemic  changes with intracranial atherosclerosis.   Electronically Signed   By: Charline BillsSriyesh  Krishnan M.D.   On: 02/21/2014 15:19     EKG Interpretation   Date/Time:  Sunday February 21 2014 15:19:22 EDT Ventricular Rate:  70 PR Interval:  231 QRS Duration: 94 QT Interval:  431 QTC Calculation: 465 R Axis:   -74 Text Interpretation:  Sinus rhythm Paired ventricular premature complexes  Prolonged PR interval Anterolateral infarct, old Artifact in limb leads  limits evaluation Otherwise no significant change Confirmed by Jahlon Baines   MD, Deneen Slager ([id number]) on 02/21/2014 3:30:33 PM      MDM   Final diagnoses:  CVA (cerebral vascular accident)    Patient was a code stroke on arrival. After CT I was able to examine patient and her symptoms have drastically improved, but there is still weakness in left leg as above. At this time as her is mild and her symptoms have improved she is not a TPA candidate. Will admit to hospitalist.    Audree CamelScott T Aretta Stetzel, MD 02/21/14 1725

## 2014-02-21 NOTE — ED Notes (Addendum)
Family at bedside sts they were not actually at her house, they were on the phone with her when she all of a sudden started to have slurred speech and then dropped the phone. Family rushed over to house, pt was unable to lift left arm up and couldn't walk, was also having slurred speech. sts she went to church today and left church around 1245.

## 2014-02-21 NOTE — Progress Notes (Signed)
Patient admitted to the unit via ER.Patient alert and oriented x4. Patient oriented to the room and son is at bedside.

## 2014-02-21 NOTE — Progress Notes (Signed)
Report called to this writer at this time from ER.

## 2014-02-21 NOTE — ED Notes (Signed)
Pharmacy tech at bedside 

## 2014-02-21 NOTE — ED Notes (Signed)
Admitting physician at bedside

## 2014-02-21 NOTE — H&P (Signed)
[facility] History and Physical  Abigail Brooks EAV:[id number] DOB: 22-Apr-1935 DOA: 02/21/2014  Referring physician: EDP PCP: [name], MAHIMA, MD   Chief Complaint: Slurred speech/L side weakness  HPI: Abigail Brooks is a 79 y.o. female with PMH of DM, HTN, Dementia, CAD, CHF was in her usual state of health this am. She felt a little weak, went to church left early, was driven home by a friend, then fixed herself a sandwich, after this was talking to her son on the telephone who suddenly noted slurring of her speech and then she dropped the phone. Her son rushed to her house and found her slurred, unable to move her L arm and leg. EMS was called and she was brought to the ER. While in ER, Seen by Neurology, Her weakness is improving, not back to baseline yet. She denies any dysuria, has long standing urinary frequency   Review of Systems:  Constitutional:  No weight loss, night sweats, Fevers, chills, fatigue.  HEENT:  No headaches, Difficulty swallowing,Tooth/dental problems,Sore throat,  No sneezing, itching, ear ache, nasal congestion, post nasal drip,  Cardio-vascular:  No chest pain, Orthopnea, PND, swelling in lower extremities, anasarca, dizziness, palpitations  GI:  No heartburn, indigestion, abdominal pain, nausea, vomiting, diarrhea, change in bowel habits, loss of appetite  Resp:  No shortness of breath with exertion or at rest. No excess mucus, no productive cough, No non-productive cough, No coughing up of blood.No change in color of mucus.No wheezing.No chest wall deformity  Skin:  no rash or lesions.  GU:  no dysuria, change in color of urine, no urgency or frequency. No flank pain.  Musculoskeletal:  L leg and arm weakness Psych:  No change in mood or affect. No depression or anxiety. No memory loss.   Past Medical History  Diagnosis Date  . Coronary artery disease     stents x 2, sees Dr. Halina [facility], [facility]  . Congestive heart failure     2012   . Myocardial infarction     "unsure of when"  . Hypertension   . Diabetes mellitus   . Shortness of breath   . Anemia   . Blood transfusion     when had hysterectomy  . Hypothyroidism     has had hx of treatment  . Urinary frequency   . Urinary tract infection     hx of  . GERD (gastroesophageal reflux disease)   . H/O hiatal hernia   . Arthritis   . Depression   . Spinal stenosis     with bone spurs and ruptured disc  . Cataract   . Blood transfusion without reported diagnosis   . Complications affecting other specified body systems, hypertension   . Other malaise and fatigue   . Dysuria   . Unspecified vitamin D deficiency   . Anxiety state, unspecified   . Other B-complex deficiencies   . Abnormality of gait   . Other and unspecified hyperlipidemia    Past Surgical History  Procedure Laterality Date  . Cataract extraction w/ intraocular lens  implant, bilateral    . Bladder suspension    . Coronary      stents x 2, stent implant placed9/21/06  . Back surgery      ruptured disc, lumbar area  . Appendectomy    . Cardiac catheterization    . Lumbar laminectomy/decompression microdiscectomy  12/21/2011    Procedure: LUMBAR LAMINECTOMY/DECOMPRESSION MICRODISCECTOMY 1 LEVEL;  Surgeon: Temple PaciniHenry A Pool, MD;  Location: [facility];  Service: Neurosurgery;  Laterality: Bilateral;  Lumbar four-five decompressive lumbar laminectomy; Right Lumbar four-five microdiscectomy  . Eye surgery    . Spine surgery    . Abdominal hysterectomy  1986   Social History:  reports that she has quit smoking. Her smoking use included Cigarettes. She smoked 0.00 packs per day. Her smokeless tobacco use includes Snuff. She reports that she does not drink alcohol or use illicit drugs.  Allergies  Allergen Reactions  . Enalapril Swelling    Facial swelling  . Morphine And Related Other (See Comments)    Makes her crazy    Family History  Problem Relation Age of Onset  . Anesthesia problems  Sister   . Anesthesia problems Son   . Heart disease Mother      Prior to Admission medications   Medication Sig Start Date End Date Taking? Authorizing Provider  aspirin EC 325 MG tablet Take 325 mg by mouth daily.    Yes Historical Provider, MD  Calcium Carbonate-Vitamin D (CALCIUM + D PO) Take 1 tablet by mouth daily. Calcium 315 mg,Vitamin D3 250 I.U.   Yes Historical Provider, MD  clonazePAM (KLONOPIN) 0.5 MG tablet Take 0.5 mg by mouth 3 (three) times daily.   Yes Historical Provider, MD  ferrous sulfate 325 (65 FE) MG tablet Take 325 mg by mouth daily with breakfast.   Yes Historical Provider, MD  fesoterodine (TOVIAZ) 4 MG TB24 tablet Take 4 mg by mouth daily.   Yes Historical Provider, MD  fish oil-omega-3 fatty acids 1000 MG capsule Take 1 g by mouth daily.    Yes Historical Provider, MD  hydrALAZINE (APRESOLINE) 25 MG tablet Take 25 mg by mouth 3 (three) times daily.   Yes Historical Provider, MD  losartan-hydrochlorothiazide (HYZAAR) 50-12.5 MG per tablet Take 1 tablet by mouth daily. 02/17/13  Yes Mahima Glade LloydPandey, MD  metFORMIN (GLUCOPHAGE) 500 MG tablet Take 1 tablet (500 mg total) by mouth daily with breakfast. 11/04/13  Yes Mahima Pandey, MD  metoprolol (LOPRESSOR) 100 MG tablet Take 1 tablet (100 mg total) by mouth 2 (two) times daily. 12/14/13  Yes Tiffany L Reed, DO  nitroGLYCERIN (NITROSTAT) 0.4 MG SL tablet Place 0.4 mg under the tongue every 5 (five) minutes as needed for chest pain. Max 3 tablets in 15 minutes   Yes Historical Provider, MD  oxyCODONE-acetaminophen (PERCOCET/ROXICET) 5-325 MG per tablet Take 1-2 tablets by mouth every 6 (six) hours as needed for severe pain (pain).   Yes Historical Provider, MD  polyethylene glycol (MIRALAX / GLYCOLAX) packet Take 17 g by mouth daily as needed (pain).   Yes Historical Provider, MD  polyethylene glycol powder (GLYCOLAX/MIRALAX) powder Take 17 g by mouth daily as needed. 06/23/13  Yes Mahima Glade LloydPandey, MD  QUEtiapine (SEROQUEL) 25 MG  tablet Take 1 tablet (25 mg total) by mouth 2 (two) times daily. Take 1 tablet twice daily 11/25/13  Yes Mahima Glade LloydPandey, MD  senna-docusate (SENNA S) 8.6-50 MG per tablet Take 1 tablet by mouth 2 (two) times daily.   Yes Historical Provider, MD   Physical Exam: Filed Vitals:   02/21/14 1700  BP: 111/40  Pulse: 68  Temp:   Resp: 20    BP 111/40  Pulse 68  Temp(Src) 98.6 F (37 C)  Resp 20  Ht 5' (1.524 m)  Wt 84.701 kg (186 lb 11.7 oz)  BMI 36.47 kg/m2  SpO2 90%  General:  Appears calm and comfortable, AAOx3 Eyes: PERRL, normal lids, irises & conjunctiva ENT:  grossly normal hearing, lips & tongue Neck: no LAD, masses or thyromegaly Cardiovascular: RRR, no m/r/g. No LE edema. Respiratory: CTA bilaterally, no w/r/r. Normal respiratory effort. Abdomen: soft, NT, BS present Skin: no rash or induration seen on limited exam Musculoskeletal: grossly normal tone BUE/BLE Psychiatric: grossly normal mood and affect, speech fluent and appropriate Neurologic: LLE 3/5, LUE 4/5, sensory light touch intact Cranial nerves 2-12 intact, DTR 1plus, plantars mute           Labs on Admission:  Basic Metabolic Panel:  Recent Labs Lab 02/21/14 1516 02/21/14 1528  NA 143 140  K 4.1 4.0  CL 100 103  CO2 27  --   GLUCOSE 194* 196*  BUN 25* 29*  CREATININE 1.13* 1.30*  CALCIUM 10.8*  --    Liver Function Tests:  Recent Labs Lab 02/21/14 1516  AST 15  ALT 22  ALKPHOS 64  BILITOT 0.5  PROT 6.7  ALBUMIN 3.4*   No results found for this basename: LIPASE, AMYLASE,  in the last 168 hours No results found for this basename: AMMONIA,  in the last 168 hours CBC:  Recent Labs Lab 02/21/14 1516 02/21/14 1528  WBC 7.8  --   NEUTROABS 5.4  --   HGB 14.0 14.6  HCT 40.7 43.0  MCV 91.1  --   PLT 173  --    Cardiac Enzymes: No results found for this basename: CKTOTAL, CKMB, CKMBINDEX, TROPONINI,  in the last 168 hours  BNP (last 3 results) No results found for this basename:  PROBNP,  in the last 8760 hours CBG: No results found for this basename: GLUCAP,  in the last 168 hours  Radiological Exams on Admission: Ct Head Wo Contrast  02/21/2014   CLINICAL DATA:  Left-sided weakness  EXAM: CT HEAD WITHOUT CONTRAST  TECHNIQUE: Contiguous axial images were obtained from the base of the skull through the vertex without intravenous contrast.  COMPARISON:  10/07/2013  FINDINGS: No evidence of parenchymal hemorrhage or extra-axial fluid collection. No mass lesion, mass effect, or midline shift.  No CT evidence of acute infarction.  Subcortical white matter and periventricular small vessel ischemic changes. Intracranial atherosclerosis.  Cerebral volume is within normal limits.  No ventriculomegaly.  The visualized paranasal sinuses are essentially clear. Partial opacification of the left mastoid air cells.  No evidence of calvarial fracture.  IMPRESSION: No evidence of acute intracranial abnormality.  Small vessel ischemic changes with intracranial atherosclerosis.   Electronically Signed   By: Charline Bills M.D.   On: 02/21/2014 15:19    EKG: Independently reviewed. NSR, no acute ST t wave changes  Assessment/Plan    Left-sided weakness -CT head unremarkable -Not given tpa since symptoms improving -Neuro consulting -Continue ASA, ? Change to Plavix-defer to Neuro -check ECHO/CArotid duplex -FU lipids, hbaic -PT/OT eval    Lumbar stenosis with neurogenic claudication -continue home dose narcotics    Type II diabetes mellitus  -hold metformin, SSI    CAD  -continue ASA/metoprolol    Dementia with behavioral disturbance -stable, continue home dose seroquel   Possible UTI vs asymptomatic pyuria -empiric ceftriaxone, FU Urine CX  DVT proph: lovenox  Code Status: Full Code Family Communication: d/w son at daughter in law at bedside Disposition Plan: inpatient  Time spent:  [facility] Pager [phone]  **Disclaimer: This  note may have been dictated with voice recognition software. Similar sounding words can inadvertently be transcribed and this note may contain transcription errors which may not have been  corrected upon publication of note.**

## 2014-02-21 NOTE — ED Notes (Signed)
Dr. Criss AlvineGoldston at bedside to speak with family.

## 2014-02-21 NOTE — ED Notes (Signed)
Phlebotomy at bedside.

## 2014-02-22 ENCOUNTER — Inpatient Hospital Stay (HOSPITAL_COMMUNITY): Payer: Medicare Other

## 2014-02-22 DIAGNOSIS — R0989 Other specified symptoms and signs involving the circulatory and respiratory systems: Secondary | ICD-10-CM | POA: Diagnosis not present

## 2014-02-22 DIAGNOSIS — I5032 Chronic diastolic (congestive) heart failure: Secondary | ICD-10-CM | POA: Diagnosis not present

## 2014-02-22 DIAGNOSIS — I2583 Coronary atherosclerosis due to lipid rich plaque: Secondary | ICD-10-CM

## 2014-02-22 DIAGNOSIS — I2510 Atherosclerotic heart disease of native coronary artery without angina pectoris: Secondary | ICD-10-CM

## 2014-02-22 DIAGNOSIS — F0391 Unspecified dementia with behavioral disturbance: Secondary | ICD-10-CM | POA: Diagnosis not present

## 2014-02-22 DIAGNOSIS — I10 Essential (primary) hypertension: Secondary | ICD-10-CM

## 2014-02-22 DIAGNOSIS — R4789 Other speech disturbances: Secondary | ICD-10-CM | POA: Diagnosis not present

## 2014-02-22 DIAGNOSIS — R29818 Other symptoms and signs involving the nervous system: Secondary | ICD-10-CM | POA: Diagnosis not present

## 2014-02-22 DIAGNOSIS — I6350 Cerebral infarction due to unspecified occlusion or stenosis of unspecified cerebral artery: Secondary | ICD-10-CM | POA: Diagnosis not present

## 2014-02-22 DIAGNOSIS — I517 Cardiomegaly: Secondary | ICD-10-CM | POA: Diagnosis not present

## 2014-02-22 LAB — GLUCOSE, CAPILLARY
GLUCOSE-CAPILLARY: 169 mg/dL — AB (ref 70–99)
Glucose-Capillary: 206 mg/dL — ABNORMAL HIGH (ref 70–99)
Glucose-Capillary: 165 mg/dL — ABNORMAL HIGH (ref 70–99)
Glucose-Capillary: 172 mg/dL — ABNORMAL HIGH (ref 70–99)

## 2014-02-22 LAB — LIPID PANEL
Cholesterol: 190 mg/dL (ref 0–200)
HDL: 32 mg/dL — ABNORMAL LOW (ref >39)
LDL Cholesterol: UNDETERMINED mg/dL (ref 0–99)
Total CHOL/HDL Ratio: 5.9 RATIO
Triglycerides: 524 mg/dL — ABNORMAL HIGH (ref <150)
VLDL: UNDETERMINED mg/dL (ref 0–40)

## 2014-02-22 LAB — HEMOGLOBIN A1C
Hgb A1c MFr Bld: 6.4 % — ABNORMAL HIGH (ref <5.7)
Mean Plasma Glucose: 137 mg/dL — ABNORMAL HIGH (ref <117)

## 2014-02-22 NOTE — Evaluation (Signed)
Speech Language Pathology Evaluation Patient Details Name: Abigail PitcherFrances L Castrellon MRN: [id number] DOB: 1935/04/17 Today's Date: 02/22/2014 Time: [id number] SLP Time Calculation (min): 39 min  Problem List:  Patient Active Problem List   Diagnosis Date Noted  . CVA (cerebral vascular accident) 02/21/2014  . Left-sided weakness 02/21/2014  . CVA (cerebral infarction) 02/21/2014  . Dementia with behavioral disturbance 11/25/2013  . Overactive bladder 11/04/2013  . Essential hypertension, benign 10/21/2013  . Memory loss 10/21/2013  . Delusional disorder 10/21/2013  . UTI (urinary tract infection) 10/21/2013  . Falls 08/05/2013  . Syncope 07/01/2013    Class: Acute  . Unspecified constipation 06/24/2013  . Routine general medical examination at a health care facility 06/24/2013  . Cartilage disorder 06/24/2013  . Other and unspecified hyperlipidemia 06/23/2013  . Hypertension associated with diabetes 11/18/2012  . Type II or unspecified type diabetes mellitus without mention of complication, not stated as uncontrolled 11/18/2012  . CAD (coronary artery disease) 11/18/2012  . Depression 11/18/2012  . Anxiety state, unspecified 11/18/2012  . Chronic diastolic CHF (congestive heart failure) 11/18/2012  . Lumbar stenosis with neurogenic claudication 12/21/2011   Past Medical History:  Past Medical History  Diagnosis Date  . Coronary artery disease     stents x 2, sees Dr. Halina AndreasSmith, [facility]  . Congestive heart failure     2012  . Myocardial infarction     "unsure of when"  . Hypertension   . Diabetes mellitus   . Shortness of breath   . Anemia   . Blood transfusion     when had hysterectomy  . Hypothyroidism     has had hx of treatment  . Urinary frequency   . Urinary tract infection     hx of  . GERD (gastroesophageal reflux disease)   . H/O hiatal hernia   . Arthritis   . Depression   . Spinal stenosis     with bone spurs and ruptured disc  . Cataract   . Blood  transfusion without reported diagnosis   . Complications affecting other specified body systems, hypertension   . Other malaise and fatigue   . Dysuria   . Unspecified vitamin D deficiency   . Anxiety state, unspecified   . Other B-complex deficiencies   . Abnormality of gait   . Other and unspecified hyperlipidemia    Past Surgical History:  Past Surgical History  Procedure Laterality Date  . Cataract extraction w/ intraocular lens  implant, bilateral    . Bladder suspension    . Coronary      stents x 2, stent implant placed9/21/06  . Back surgery      ruptured disc, lumbar area  . Appendectomy    . Cardiac catheterization    . Lumbar laminectomy/decompression microdiscectomy  12/21/2011    Procedure: LUMBAR LAMINECTOMY/DECOMPRESSION MICRODISCECTOMY 1 LEVEL;  Surgeon: Temple PaciniHenry A Pool, MD;  Location: [facility];  Service: Neurosurgery;  Laterality: Bilateral;  Lumbar four-five decompressive lumbar laminectomy; Right Lumbar four-five microdiscectomy  . Eye surgery    . Spine surgery    . Abdominal hysterectomy  1986   HPI:  79 [facility] with left sided weakness and dysarthria.  Pt has h/o HTN, DM, CHF, dementia.  Pt reports that her son manages all home duties including bill paying, medicine management and driving.   Her son and daughter in law work full time and call her every day after work per pt.  Pt CT head negative, CXR negative. Pt  for SLE.     Assessment / Plan / Recommendation Clinical Impression  Pt presents with pt reported baseline memory deficits - *pt with premorbid dementia.  Pt able to follow commands and communicate at high level without evidence of language difficulties.  She was oriented to place, person, situation but not date (which she states is normal).  As pt has support needed at home including someone managing her driving, finances and medication management, no furhter SLP warranted at this time.  SlP educated pt to compensation strategies to deal with  progressive memory loss.   Reviewed said strategies orally with pt (print too small for pt).  All education completed.  Thanks for this referral.      SLP Assessment  Patient does not need any further Speech Lanaguage Pathology Services    Follow Up Recommendations  None    Frequency and Duration   n/a     Pertinent Vitals/Pain Afebrile, decreased    SLP Evaluation Prior Functioning  Cognitive/Linguistic Baseline: Baseline deficits Type of Home: House  Lives With: Alone Available Help at Discharge:  (both daughter in law and son [name] work full time) Education: 8th grade, retired  Marine scientistVocation: Retired   IT consultantCognition  Overall Cognitive Status: History of cognitive impairments - at baseline (per pt at baseline and given cognitive level, suspect accurate information) Arousal/Alertness: Awake/alert Orientation Level: Oriented to person;Oriented to place;Oriented to situation (not oriented to year or date, states this is her baseline) Memory: Impaired Memory Impairment: Storage deficit;Retrieval deficit;Decreased recall of new information;Decreased long term memory;Decreased short term memory (pt has premorbid memory deficits) Awareness: Appears intact Problem Solving: Appears intact Safety/Judgment: Appears intact (able to state need to call for assist due to fall risk)    Comprehension  Auditory Comprehension Overall Auditory Comprehension: Appears within functional limits for tasks assessed Yes/No Questions: Not tested Commands: Within Functional Limits Conversation: Complex Visual Recognition/Discrimination Discrimination: Not tested Reading Comprehension Reading Status: Within funtional limits (for clock in room, pt has reading glasses at home)    Expression Expression Primary Mode of Expression: Verbal Verbal Expression Overall Verbal Expression: Appears within functional limits for tasks assessed Initiation: No impairment Level of Generative/Spontaneous Verbalization:  Conversation Repetition: No impairment Naming: Not tested Pragmatics: No impairment Written Expression Dominant Hand: Right Written Expression: Not tested   Oral / Motor Oral Motor/Sensory Function Overall Oral Motor/Sensory Function: Appears within functional limits for tasks assessed Motor Speech Overall Motor Speech: Appears within functional limits for tasks assessed Respiration: Within functional limits Resonance: Within functional limits Articulation: Within functional limitis Motor Planning: Witnin functional limits Motor Speech Errors: Not applicable   GO     Mills KollerKimball, Rhonda Vangieson Ann Tawonda Legaspi, MS [facility] SLP [phone]

## 2014-02-22 NOTE — Progress Notes (Signed)
[facility] PROGRESS NOTE  Michela PitcherFrances L Stuber WUJ:[id number] DOB: 02-09-1935 DOA: 02/21/2014 PCP: [name], MAHIMA, MD  Assessment/Plan Left-sided weakness  -Improving -CT head unremarkable  -Not given tpa since symptoms improving  -Neuro following -on ASA 325mg , was on same PTA, ? Change to Plavix-defer to Neuro  -FU  ECHO, CArotid duplex with 1-39% stenosis -lipids with High TG, hbaic pending -PT/OT eval pending  Lumbar stenosis with neurogenic claudication  -continue home dose narcotics   Type II diabetes mellitus  -hold metformin, SSI   CAD  -continue ASA/metoprolol   Dementia with behavioral disturbance  -stable, continue home dose seroquel   Possible UTI vs asymptomatic pyuria  -Continue empiric ceftriaxone, FU Urine CX   DVT proph: lovenox  Code Status: Full Code Family Communication: none at bedside Disposition Plan: Pending workup    Consultants:  NEuro  Antibiotics:  ceftraixone  HPI/Subjective: L side much improved   Objective: Filed Vitals:   02/22/14 0600  BP: 146/56  Pulse: 60  Temp: 98.1 F (36.7 C)  Resp: 50    Intake/Output Summary (Last 24 hours) at 02/22/14 1032 Last data filed at 02/21/14 2108  Gross per 24 hour  Intake    360 ml  Output    150 ml  Net    210 ml   Filed Weights   02/21/14 1519  Weight: 84.701 kg (186 lb 11.7 oz)    Exam:   General:  AAOx3, no distress  Cardiovascular: S1S2/RRR  Respiratory:CTAB  Abdomen:soft, Nt, BS present  Musculoskeletal: no edema c/c  Neuro: LUE and LLE now 5/5, no dysarthria either   Data Reviewed: Basic Metabolic Panel:  Recent Labs Lab 02/21/14 1516 02/21/14 1528  NA 143 140  K 4.1 4.0  CL 100 103  CO2 27  --   GLUCOSE 194* 196*  BUN 25* 29*  CREATININE 1.13* 1.30*  CALCIUM 10.8*  --    Liver Function Tests:  Recent Labs Lab 02/21/14 1516  AST 15  ALT 22  ALKPHOS 64  BILITOT 0.5  PROT 6.7  ALBUMIN 3.4*   No results found for this basename:  LIPASE, AMYLASE,  in the last 168 hours No results found for this basename: AMMONIA,  in the last 168 hours CBC:  Recent Labs Lab 02/21/14 1516 02/21/14 1528  WBC 7.8  --   NEUTROABS 5.4  --   HGB 14.0 14.6  HCT 40.7 43.0  MCV 91.1  --   PLT 173  --    Cardiac Enzymes: No results found for this basename: CKTOTAL, CKMB, CKMBINDEX, TROPONINI,  in the last 168 hours BNP (last 3 results) No results found for this basename: PROBNP,  in the last 8760 hours CBG:  Recent Labs Lab 02/21/14 2020 02/21/14 2154 02/22/14 0649  GLUCAP 148* 201* 206*    No results found for this or any previous visit (from the past 240 hour(s)).   Studies: Dg Chest 2 View  02/22/2014   CLINICAL DATA:  Stroke protocol.  No current chest complaints.  EXAM: CHEST  2 VIEW  COMPARISON:  10/07/2013  FINDINGS: Shallow inspiration. The heart size and mediastinal contours are within normal limits. Both lungs are clear. The visualized skeletal structures are unremarkable. Degenerative changes in the spine.  IMPRESSION: No active cardiopulmonary disease.   Electronically Signed   By: Burman NievesWilliam  Stevens M.D.   On: 02/22/2014 00:25   Ct Head Wo Contrast  02/21/2014   CLINICAL DATA:  Left-sided weakness  EXAM: CT HEAD WITHOUT CONTRAST  TECHNIQUE:  Contiguous axial images were obtained from the base of the skull through the vertex without intravenous contrast.  COMPARISON:  10/07/2013  FINDINGS: No evidence of parenchymal hemorrhage or extra-axial fluid collection. No mass lesion, mass effect, or midline shift.  No CT evidence of acute infarction.  Subcortical white matter and periventricular small vessel ischemic changes. Intracranial atherosclerosis.  Cerebral volume is within normal limits.  No ventriculomegaly.  The visualized paranasal sinuses are essentially clear. Partial opacification of the left mastoid air cells.  No evidence of calvarial fracture.  IMPRESSION: No evidence of acute intracranial abnormality.  Small vessel  ischemic changes with intracranial atherosclerosis.   Electronically Signed   By: Charline BillsSriyesh  Krishnan M.D.   On: 02/21/2014 15:19    Scheduled Meds: . aspirin EC  325 mg Oral Daily  . aspirin  300 mg Rectal Daily   Or  . aspirin  325 mg Oral Daily  . cefTRIAXone (ROCEPHIN)  IV  1 g Intravenous Q24H  . enoxaparin (LOVENOX) injection  30 mg Subcutaneous Q24H  . ferrous sulfate  325 mg Oral Q breakfast  . fesoterodine  4 mg Oral Daily  . insulin aspart  0-9 Units Subcutaneous TID WC  . metoprolol  100 mg Oral BID  . QUEtiapine  25 mg Oral BID  . senna-docusate  1 tablet Oral BID   Continuous Infusions:  Antibiotics Given (last 72 hours)   Date/Time Action Medication Dose Rate   02/21/14 2108 Given   cefTRIAXone (ROCEPHIN) 1 g in dextrose 5 % 50 mL IVPB 1 g 100 mL/hr      Principal Problem:   Left-sided weakness Active Problems:   Lumbar stenosis with neurogenic claudication   Type II or unspecified type diabetes mellitus without mention of complication, not stated as uncontrolled   CAD (coronary artery disease)   Chronic diastolic CHF (congestive heart failure)   Essential hypertension, benign   Dementia with behavioral disturbance   CVA (cerebral infarction)    Time spent: 35min    [facility] Pocket CountyJOSEPH,Cecilio Ohlrich  [facility] Pager [phone]. If 7PM-7AM, please contact night-coverage at [email or web address], password [email or web address] 02/22/2014, 10:32 AM  LOS: 1 day

## 2014-02-22 NOTE — Progress Notes (Signed)
Clinical Social Work Department BRIEF PSYCHOSOCIAL ASSESSMENT 02/22/2014  Patient:  Abigail Brooks,Abigail Brooks     Account Number:  [id number]     Admit date:  02/21/2014  Clinical Social Worker:  Varney BilesANDERSON,Dotty Gonzalo, LCSWA  Date/Time:  02/22/2014 03:46 PM  Referred by:  Physician  Date Referred:  02/22/2014 Referred for  SNF Placement   Other Referral:   Interview type:  Patient Other interview type:    PSYCHOSOCIAL DATA Living Status:  ALONE Admitted from facility:   Level of care:   Primary support name:  Abigail Brooks ([phone]) Primary support relationship to patient:  CHILD, ADULT Degree of support available:   Good--pt has support from son and daughter, and lives alone prior to hospitalization.    CURRENT CONCERNS Current Concerns  Post-Acute Placement   Other Concerns:    SOCIAL WORK ASSESSMENT / PLAN CSW consulted for SNF placement. CSW explained role in discharge and need to make discharge plan, and pt listened to SNF for rehab vs. going home with home health. CSW explained PT is recommending pt go to rehab for a few days/weeks to work more intensely with rehab therapies. CSW informed pt of home health, and explained they may come out a few times a week but do not stay for long and pt is not being recommended for this. Pt expressed understanding and feeling that her family will want her to go to facility, but pt does not want to go. Pt states she wants to go home. CSW reiterated that medical team is recommending SNF but that ultimately it is pt's choice--pt understands and chooses to go home. CSW informed RNCM.   Assessment/plan status:  No Further Intervention Required Other assessment/ plan:   Information/referral to community resources:   Home health (refused SNF)    PATIENT'S/FAMILY'S RESPONSE TO PLAN OF CARE: Good--pt friendly and thanked CSW for offering SNF, but pt wants to go home. CSW signing off.       Maryclare LabradorJulie Mychal Durio, MSW, [facility], CooperCSWA Clinical Social  Worker [phone]

## 2014-02-22 NOTE — Evaluation (Addendum)
Physical Therapy Evaluation Patient Details Name: Abigail Brooks MRN: [id number] DOB: 02-Dec-1934 Today's Date: 02/22/2014   History of Present Illness  Abigail Brooks is a 79 y.o. female with PMH of DM, HTN, Dementia, CAD, CHF was in her usual state of health this am. She felt a little weak, went to church left early, was driven home by a friend, then fixed herself a sandwich, after this was talking to her son on the telephone who suddenly noted slurring of her speech and then she dropped the phone. Her son rushed to her house and found her slurred, unable to move her L arm and leg. Admitted from ER 7/5.  Clinical Impression  Pt with noted impaired short term memory deficits, decreased safety awareness, and decreased insight to deficits. Pt also demonstrated increased falls risk and has a history of freq falls per pt reports. Pt also demo's generalized weakness and decreased endurance. Pt is not safe living home alone at this time due to above deficits. Pt would need 24/7 supervision to return home. Spoke with daughter regarding these recommendations and she reports "we've tried to tell her all this but she's so stubborn." Acute PT to con't to follow to progress mobility as able.    Follow Up Recommendations Home health PT;Supervision/Assistance - 24 hour ( would need ST-SNF if 24/7 supervision unavailable)    Equipment Recommendations  None recommended by PT    Recommendations for Other Services OT consult     Precautions / Restrictions Precautions Precautions: Fall Restrictions Weight Bearing Restrictions: No      Mobility  Bed Mobility Overal bed mobility:  (pt on BSC upon PT arrival)                Transfers Overall transfer level: Needs assistance Equipment used: Rolling walker (2 wheeled) Transfers: Sit to/from Stand Sit to Stand: Min guard         General transfer comment: v/c's for safe hand placement, significant increase in  time  Ambulation/Gait Ambulation/Gait assistance: Min guard Ambulation Distance (Feet): 45 Feet Assistive device: Rolling walker (2 wheeled) Gait Pattern/deviations: Step-through pattern;Decreased stride length;Shuffle   Gait velocity interpretation: Below normal speed for age/gender General Gait Details: pt with + SOB, fatigued quickly, R LE antalgic limp. pt impulsively sat down in chair  Stairs            Wheelchair Mobility    Modified Rankin (Stroke Patients Only)       Balance Overall balance assessment: Needs assistance Sitting-balance support: Feet supported Sitting balance-Leahy Scale: Fair     Standing balance support: Bilateral upper extremity supported Standing balance-Leahy Scale: Poor Standing balance comment: requires support or RW for safe standing balance                             Pertinent Vitals/Pain Denies pain    Home Living Family/patient expects to be discharged to:: Private residence Living Arrangements: Alone Available Help at Discharge: Family;Available PRN/intermittently Type of Home: House Home Access: Level entry     Home Layout: One level;Laundry or work area in basement (flight of stairs to basement with handrail on R) Home Equipment: Environmental consultantWalker - 2 wheels;Cane - single point Additional Comments: pt reports doing laundry but has difficulty carrying laundry up/down stairs    Prior Function Level of Independence: Needs assistance   Gait / Transfers Assistance Needed: reports using cane occasionally however also reports having freq falls. Pt's daughter reports they've tried  to get her to use her RW but shes "stubborn and refuses"  ADL's / Homemaking Assistance Needed: son goes to the grocery store for her, she doesnt drive. Pt's dtr reports they've tried to do her laundry but she refused help        Hand Dominance   Dominant Hand: Right    Extremity/Trunk Assessment   Upper Extremity Assessment: Generalized  weakness           Lower Extremity Assessment: Generalized weakness      Cervical / Trunk Assessment: Normal  Communication   Communication: No difficulties  Cognition Arousal/Alertness: Awake/alert Behavior During Therapy: WFL for tasks assessed/performed Overall Cognitive Status: History of cognitive impairments - at baseline (also demo's decreased safey awareness and decreased insight to deficits)       Memory: Decreased short-term memory              General Comments General comments (skin integrity, edema, etc.): spoke with daughter regarding PT recommendations of pt not living alone or doing laundry due to freq fall history and impaired cognition.    Exercises        Assessment/Plan    PT Assessment Patient needs continued PT services  PT Diagnosis Difficulty walking;Generalized weakness   PT Problem List Decreased strength;Decreased activity tolerance;Decreased balance;Decreased mobility  PT Treatment Interventions DME instruction;Gait training;Stair training;Functional mobility training;Therapeutic activities;Therapeutic exercise;Balance training;Neuromuscular re-education   PT Goals (Current goals can be found in the Care Plan section) Acute Rehab PT Goals Patient Stated Goal: home PT Goal Formulation: With patient/family Time For Goal Achievement: 03/08/14 Potential to Achieve Goals: Good    Frequency Min 3X/week   Barriers to discharge Decreased caregiver support pt lives alone    Co-evaluation               End of Session Equipment Utilized During Treatment: Gait belt Activity Tolerance: Patient limited by fatigue Patient left: in chair;with call bell/phone within reach;with chair alarm set;with family/visitor present Nurse Communication: Mobility status         Time: 0900-0930 PT Time Calculation (min): 30 min   Charges:   PT Evaluation $Initial PT Evaluation Tier I: 1 Procedure PT Treatments $Gait Training: 8-22 mins   PT  G CodesMarcene Brawn:          Kolbe Delmonaco Marie 02/22/2014, 11:50 AM  Lewis ShockAshly Emiah Pellicano, PT, DPT Pager #: [phone] Office #: [phone]

## 2014-02-22 NOTE — Evaluation (Signed)
Occupational Therapy Evaluation Patient Details Name: Abigail Brooks MRN: [id number] Abigail PitcherDOB: Aug 01, 1935 Today's Date: 02/22/2014    History of Present Illness Abigail PitcherFrances L Brooks is a 79 y.o. female with PMH of DM, HTN, Dementia, CAD, CHF was in her usual state of health this am. She felt a little weak, went to church left early, was driven home by a friend, then fixed herself a sandwich, after this was talking to her son on the telephone who suddenly noted slurring of her speech and then she dropped the phone. Her son rushed to her house and found her slurred, unable to move her L arm and leg. Admitted from ER 7/5. (CT negative for acute abnormality)   Clinical Impression   Pt admitted with above. She demonstrates the below listed deficits and will benefit from continued OT to maximize safety and independence with BADLs.  Pt presents to OT with h/o dementia (cognition appears at baseline), and impaired balance and safety awareness.  She was living alone PTA with son and daughter in law assisting with IADLs.  Pt does endorse a h/o falls.  At this time, do not feel pt is safe to return home alone as her risk of fall is high.  She is opposed to SNF, but with further discussion that it would be temporary, she was agreeable ("I'll go if I have to").   Currently, she requires min A for BADLs.       Follow Up Recommendations  SNF;Supervision/Assistance - 24 hour    Equipment Recommendations  Tub/shower seat    Recommendations for Other Services       Precautions / Restrictions Precautions Precautions: Fall Precaution Comments: Pt reports multiple falls at home Restrictions Weight Bearing Restrictions: No      Mobility Bed Mobility Overal bed mobility: Needs Assistance Bed Mobility: Supine to Sit     Supine to sit: Min guard;HOB elevated     General bed mobility comments: Pt heavily dependent on bedrail and having bed elevated  Transfers Overall transfer level: Needs  assistance Equipment used: Rolling walker (2 wheeled) Transfers: Sit to/from UGI CorporationStand;Stand Pivot Transfers Sit to Stand: Min guard Stand pivot transfers: Min guard       General transfer comment: verbal cues for hand placement - no carry over from PT eval     Balance Overall balance assessment: Needs assistance Sitting-balance support: Feet supported Sitting balance-Leahy Scale: Fair     Standing balance support: No upper extremity supported Standing balance-Leahy Scale: Poor Standing balance comment: requires support or RW for safe standing balance                            ADL Overall ADL's : Needs assistance/impaired Eating/Feeding: Independent;Sitting   Grooming: Wash/dry hands;Oral care;Brushing hair;Min guard;Standing   Upper Body Bathing: Set up;Supervision/ safety;Sitting   Lower Body Bathing: Minimal assistance;Sit to/from stand   Upper Body Dressing : Set up;Supervision/safety;Sitting   Lower Body Dressing: Minimal assistance;Sit to/from stand   Toilet Transfer: Minimal assistance;Ambulation;Comfort height toilet   Toileting- Clothing Manipulation and Hygiene: Moderate assistance;Sit to/from stand       Functional mobility during ADLs: Minimal assistance;Rolling walker;Min guard (min guard with RW; min a without RW) General ADL Comments: Pt requires min verbal cues for walker safety.  She acknowleges falls at home.  Pt very unsteady without use of RW.       Vision Eye Alignment: Within Functional Limits Alignment/Gaze Preference: Within Defined Limits Ocular Range of Motion:  Within Functional Limits Tracking/Visual Pursuits: Able to track stimulus in all quads without difficulty             Perception Perception Perception Tested?: Yes   Praxis Praxis Praxis tested?: Within functional limits    Pertinent Vitals/Pain Denies pain      Hand Dominance Right   Extremity/Trunk Assessment Upper Extremity Assessment Upper Extremity  Assessment: Generalized weakness (Pt with decreased elbow extension bil - she reports baseline)   Lower Extremity Assessment Lower Extremity Assessment: Generalized weakness   Cervical / Trunk Assessment Cervical / Trunk Assessment: Normal   Communication Communication Communication: No difficulties   Cognition Arousal/Alertness: Awake/alert Behavior During Therapy: WFL for tasks assessed/performed Overall Cognitive Status: History of cognitive impairments - at baseline (also demo's decreased safey awareness and impaired carry over of information)       Memory: Decreased short-term memory             General Comments       Exercises       Shoulder Instructions      Home Living Family/patient expects to be discharged to:: Private residence Living Arrangements: Alone Available Help at Discharge: Family;Available PRN/intermittently Type of Home: House Home Access: Level entry     Home Layout: One level;Laundry or work area in basement (flight of stairs to basement with handrail on R)     Bathroom Shower/Tub: Producer, television/film/videoWalk-in shower   Bathroom Toilet: Standard     Home Equipment: Environmental consultantWalker - 2 wheels;Cane - single point   Additional Comments: pt reports doing laundry but has difficulty carrying laundry up/down stairs  Lives With: Alone    Prior Functioning/Environment Level of Independence: Needs assistance  Gait / Transfers Assistance Needed: reports using cane occasionally however also reports having freq falls. Pt's daughter reports they've tried to get her to use her RW but shes "stubborn and refuses" ADL's / Homemaking Assistance Needed: Pt reports she has been using a Teacher, musicbackscratcher as a toileting aid due to inability to access peri area; son goes to the grocery store for her, she doesnt drive. Pt's dtr reports they've tried to do her laundry but she refused help        OT Diagnosis: Generalized weakness;Cognitive deficits   OT Problem List: Decreased  strength;Impaired balance (sitting and/or standing);Decreased cognition;Decreased safety awareness;Decreased knowledge of use of DME or AE;Obesity   OT Treatment/Interventions: Self-care/ADL training;DME and/or AE instruction;Therapeutic activities;Patient/family education;Balance training;Cognitive remediation/compensation    OT Goals(Current goals can be found in the care plan section) Acute Rehab OT Goals Patient Stated Goal: home OT Goal Formulation: With patient Time For Goal Achievement: 03/08/14 Potential to Achieve Goals: Good ADL Goals Pt Will Perform Grooming: with supervision;standing Pt Will Perform Lower Body Bathing: with supervision;sit to/from stand Pt Will Perform Lower Body Dressing: with supervision;sit to/from stand Pt Will Transfer to Toilet: with supervision;regular height toilet;grab bars;ambulating Pt Will Perform Toileting - Clothing Manipulation and hygiene: with supervision;sit to/from stand;with adaptive equipment Pt Will Perform Tub/Shower Transfer: Shower transfer;with supervision;ambulating;shower seat  OT Frequency: Min 2X/week   Barriers to D/C: Decreased caregiver support          Co-evaluation              End of Session Equipment Utilized During Treatment: Rolling walker Nurse Communication: Mobility status  Activity Tolerance: Patient tolerated treatment well Patient left: in chair;with call bell/phone within reach;with chair alarm set   Time: 1121-1204 OT Time Calculation (min): 43 min Charges:  OT General Charges $OT Visit: 1 Procedure  OT Evaluation $Initial OT Evaluation Tier I: 1 Procedure OT Treatments $Self Care/Home Management : 23-37 mins G-Codes:    Ousmane Seeman M 03/14/2014, 12:27 PM

## 2014-02-22 NOTE — Progress Notes (Signed)
Bilateral carotid artery duplex:  1-39% ICA stenosis.  Vertebral artery flow is antegrade.     

## 2014-02-22 NOTE — Progress Notes (Signed)
  Echocardiogram 2D Echocardiogram has been performed.  Abigail Brooks Talon 02/22/2014, 10:21 AM

## 2014-02-23 DIAGNOSIS — G459 Transient cerebral ischemic attack, unspecified: Secondary | ICD-10-CM

## 2014-02-23 DIAGNOSIS — M6281 Muscle weakness (generalized): Secondary | ICD-10-CM | POA: Diagnosis not present

## 2014-02-23 DIAGNOSIS — I6350 Cerebral infarction due to unspecified occlusion or stenosis of unspecified cerebral artery: Secondary | ICD-10-CM | POA: Diagnosis not present

## 2014-02-23 DIAGNOSIS — E119 Type 2 diabetes mellitus without complications: Secondary | ICD-10-CM | POA: Diagnosis not present

## 2014-02-23 DIAGNOSIS — N3000 Acute cystitis without hematuria: Secondary | ICD-10-CM | POA: Diagnosis not present

## 2014-02-23 LAB — URINE CULTURE: Colony Count: >=100000

## 2014-02-23 LAB — GLUCOSE, CAPILLARY
GLUCOSE-CAPILLARY: 172 mg/dL — AB (ref 70–99)
GLUCOSE-CAPILLARY: 220 mg/dL — AB (ref 70–99)
Glucose-Capillary: 190 mg/dL — ABNORMAL HIGH (ref 70–99)
Glucose-Capillary: 167 mg/dL — ABNORMAL HIGH (ref 70–99)

## 2014-02-23 MED ORDER — LOSARTAN POTASSIUM 50 MG PO TABS
50.0000 mg | ORAL_TABLET | Freq: Every day | ORAL | Status: DC
  Administered 2014-02-23 – 2014-02-24 (×2): 50 mg via ORAL
  Filled 2014-02-23 (×2): qty 1

## 2014-02-23 MED ORDER — CLOPIDOGREL BISULFATE 75 MG PO TABS
75.0000 mg | ORAL_TABLET | Freq: Every day | ORAL | Status: DC
  Administered 2014-02-23 – 2014-02-24 (×2): 75 mg via ORAL
  Filled 2014-02-23 (×2): qty 1

## 2014-02-23 MED ORDER — ZOLPIDEM TARTRATE 5 MG PO TABS
5.0000 mg | ORAL_TABLET | Freq: Once | ORAL | Status: AC
  Administered 2014-02-23: 5 mg via ORAL
  Filled 2014-02-23: qty 1

## 2014-02-23 MED ORDER — CEFPODOXIME PROXETIL 200 MG PO TABS
200.0000 mg | ORAL_TABLET | Freq: Two times a day (BID) | ORAL | Status: DC
Start: 2014-02-23 — End: 2014-02-24
  Administered 2014-02-23 – 2014-02-24 (×3): 200 mg via ORAL
  Filled 2014-02-23 (×5): qty 1

## 2014-02-23 NOTE — Progress Notes (Addendum)
Clinical Social Work Department CLINICAL SOCIAL WORK PLACEMENT NOTE 02/23/2014  Patient:  Abigail Brooks,Abigail L  Account Number:  [id number] Admit date:  02/21/2014  Clinical Social Worker:  Abigail Brooks, Abigail Brooks  Date/time:  02/23/2014 04:11 PM  Clinical Social Work is seeking post-discharge placement for this patient at the following level of care:   SKILLED NURSING   (*CSW will update this form in Epic as items are completed)   02/23/2014  [facility]/family provided with [facility] Department of Clinical Social Work's list of facilities offering this level of care within the geographic area requested by the patient (or if unable, by the patient's family).  02/23/2014  Patient/family informed of their freedom to choose among providers that offer the needed level of care, that participate in Medicare, Medicaid or managed care program needed by the patient, have an available bed and are willing to accept the patient.  02/23/2014  Patient/family informed of [facility]' ownership interest in [facility], as well as of the fact that they are under no obligation to receive care at this facility.  PASARR submitted to EDS on 02/23/2014 PASARR number received on 02/23/2014  FL2 transmitted to all facilities in geographic area requested by pt/family on  02/23/2014 FL2 transmitted to all facilities within larger geographic area on   Patient informed that his/her managed care company has contracts with or will negotiate with  certain facilities, including the following:     Patient/family informed of bed offers received:  02/23/2014 [facility] Physician recommends and patient chooses bed at    [facility]  02/24/14 Patient to be transferred to facility by [facility] Patient and family notified of transfer on 02/24/14 Name of family member notified:  Pt's son-in-law, [name]  The following physician  request were entered in Epic:   Additional Comments: CSW went to bedside to give pt list of bed offers. Pt drowsy and asked CSW to call her son, who is on his way to the hospital. CSW left printed list of bed offers with pt's RN, who put it in the drawer where her chart should be. CSW spoke with pt's son and explained role in discharge, and son states he will choose a facility and will complete paperwork by tomorrow, as CSW informed him pt may discharge tomorrow. CSW will facilitiate discharge tomorrow.  Addendum: CSW spoke with pt's RN, who requested [facility] for 2pm. Called pt's son-in-law and informed him. Placing discharge packet on pt's chart; CSW signing off.  Abigail Brooks, MSW, [facility] Clinical Social Worker [phone]

## 2014-02-23 NOTE — Care Management Note (Signed)
    Page 1 of 1   02/24/2014     11:13:14 AM CARE MANAGEMENT NOTE 02/24/2014  Patient:  Abigail Brooks, Abigail Brooks   Account Number:  [id number]  Date Initiated:  02/23/2014  Documentation initiated by:  Lorne Skeens  Subjective/Objective Assessment:   Patient admitted with CVA symptoms, UTI. Lives at home alone.     Action/Plan:   Will follow for discharge needs pending PT/OT evals and physician orders.   Anticipated DC Date:  02/24/2014   Anticipated DC Plan:  SKILLED NURSING FACILITY  In-house referral  Clinical Social Worker      DC Planning Services  CM consult      Choice offered to / List presented to:             Status of service:   Medicare Important Message given?  YES (If response is "NO", the following Medicare IM given date fields will be blank) Date Medicare IM given:  02/24/2014 Medicare IM given by:  Lorne Skeens Date Additional Medicare IM given:   Additional Medicare IM given by:    Discharge Disposition:    Per UR Regulation:  Reviewed for med. necessity/level of care/duration of stay  If discussed at [place] of Stay Meetings, dates discussed:    Comments:  02/24/14 [place], MSN, CM- Met with patient to provide Medicare IM letter. Anticipate discharge to SNF later today.   02/23/14 0945 Lorne Skeens RN, MSN, CM- Met with patient to discuss discharge planning. Per CSW, patient had declined SNF yesterday.  Dr Broadus John informed CM that patient is now considering SNF placement.  CM verified with patient that she has changed her mind and would like to be faxed out.  CSW was notified of patient's desire to pursue short term SNF rehab.  CM will continue to follow for any changes in disposition.

## 2014-02-23 NOTE — Progress Notes (Signed)
Occupational Therapy Treatment Patient Details Name: Abigail Brooks MRN: [id number] DOB: [birth year] Today's Date: 02/23/2014    History of present illness Abigail Brooks is a 79 y.o. female with PMH of DM, HTN, Dementia, CAD, CHF was in her usual state of health this am. She felt a little weak, went to church left early, was driven home by a friend, then fixed herself a sandwich, after this was talking to her son on the telephone who suddenly noted slurring of her speech and then she dropped the phone. Her son rushed to her house and found her slurred, unable to move her L arm and leg. Admitted from ER 7/5.   OT comments  Pt. Completed multiple components of UB/LB B/D in b.room during session.  Requires continuous cueing for walker safety/management during functional tasks and transfers.  Daughter present and shakes head in "no" direction when home is referenced.  Pt. States "i'll do what they want me to" (in reference to dispo),  previous documentation from social worker states that pt. Has said home. Conflicting information from yesterday when daughter present vs. When she is not.    Follow Up Recommendations  SNF;Supervision/Assistance - 24 hour (see above note, pt. Still wavering with dispo)   Equipment Recommendations  Tub/shower seat          Precautions / Restrictions Precautions Precautions: Fall Precaution Comments: Pt reports multiple falls at home Restrictions Weight Bearing Restrictions: No       Mobility                   Transfers Overall transfer level: Needs assistance Equipment used: Rolling walker (2 wheeled) Transfers: Sit to/from [employer]GI CorporationStand;Stand Pivot Transfers Sit to Stand: Min guard Stand pivot transfers: Min guard       General transfer comment: verbal cues for hand placement                                        ADL Overall ADL's : Needs assistance/impaired     Grooming: Wash/dry hands;Wash/dry face;Oral care;Brushing  hair;Min guard;Standing Grooming Details (indicate cue type and reason): cues for walker positioning at sink for safety     Lower Body Bathing: Minimal assistance;Sit to/from stand   Upper Body Dressing : Set up;Sitting       Toilet Transfer: Minimal assistance;Ambulation;Comfort height toilet;Grab bars;RW StatisticianToilet Transfer Details (indicate cue type and reason): cues for hand placement on grab bars and rw for safety Toileting- Clothing Manipulation and Hygiene: Moderate assistance;Sit to/from stand Toileting - Clothing Manipulation Details (indicate cue type and reason): pull up underwear     Functional mobility during ADLs: Minimal assistance;Rolling walker General ADL Comments: Pt requires min verbal cues for walker safety.  She acknowleges falls at home.  Pt very unsteady without use of RW                                                                                              General Comments      Pertinent Vitals/ Pain  No c/o pain per pt.                                                          Frequency Min 2X/week     Progress Toward Goals  OT Goals(current goals can now be found in the care plan section)  Progress towards OT goals: Progressing toward goals     Plan Discharge plan remains appropriate                    End of Session Equipment Utilized During Treatment: Rolling walker   Activity Tolerance Patient tolerated treatment well   Patient Left in chair;with call bell/phone within reach;with family/visitor present             Time: 0835-0900 OT Time Calculation (min): 25 min  Charges: OT General Charges $OT Visit: 1 Procedure OT Treatments $Self Care/Home Management : 23-37 mins  Robet LeuMorris, Colbie Sliker Lorraine, COTA/L 02/23/2014, 10:07 AM

## 2014-02-23 NOTE — Progress Notes (Signed)
Subjective: Patient much improved with baseline strength now in her lower extremities.  Still has some gait issues.    Objective: Current vital signs: BP 158/71  Pulse 67  Temp(Src) 98.1 F (36.7 C) (Oral)  Resp 18  Ht 5' (1.524 m)  Wt 84.701 kg (186 lb 11.7 oz)  BMI 36.47 kg/m2  SpO2 97% Vital signs in last 24 hours: Temp:  [97.9 F (36.6 C)-98.2 F (36.8 C)] 98.1 F (36.7 C) (07/07 1437) Pulse Rate:  [62-68] 67 (07/07 1437) Resp:  [18-20] 18 (07/07 1437) BP: (158-188)/(68-86) 158/71 mmHg (07/07 1437) SpO2:  [94 %-98 %] 97 % (07/07 1437)  Intake/Output from previous day:   Intake/Output this shift: Total I/O In: 480 [P.O.:480] Out: -  Nutritional status:    Neurologic Exam: Mental Status:  Alert, oriented, thought content appropriate. Speech fluent without evidence of aphasia. Able to follow 3 step commands without difficulty.  Cranial Nerves:  II: Discs flat bilaterally; Visual fields grossly normal, pupils equal, round, reactive to light and accommodation  III,IV, VI: ptosis not present, extra-ocular motions intact bilaterally  V,VII: smile symmetric, facial light touch sensation normal bilaterally  VIII: hearing normal bilaterally  IX,X: gag reflex present  XI: bilateral shoulder shrug  XII: midline tongue extension  Motor:  Right : Upper extremity 5/5        Left: Upper extremity 5/5   Lower extremity 5/5     Lower extremity 5/5  Tone and bulk:normal tone throughout; no atrophy noted  Sensory: Pinprick and light touch intact throughout, bilaterally  Deep Tendon Reflexes: 2+ and symmetric throughout  Plantars:  Right: downgoing   Left: downgoing  Cerebellar:  normal finger-to-nose, normal rapid alternating movements and normal heel-to-shin test    Lab Results: Basic Metabolic Panel:  Recent Labs Lab 02/21/14 1516 02/21/14 1528  NA 143 140  K 4.1 4.0  CL 100 103  CO2 27  --   GLUCOSE 194* 196*  BUN 25* 29*  CREATININE 1.13* 1.30*  CALCIUM 10.8*   --     Liver Function Tests:  Recent Labs Lab 02/21/14 1516  AST 15  ALT 22  ALKPHOS 64  BILITOT 0.5  PROT 6.7  ALBUMIN 3.4*   No results found for this basename: LIPASE, AMYLASE,  in the last 168 hours No results found for this basename: AMMONIA,  in the last 168 hours  CBC:  Recent Labs Lab 02/21/14 1516 02/21/14 1528  WBC 7.8  --   NEUTROABS 5.4  --   HGB 14.0 14.6  HCT 40.7 43.0  MCV 91.1  --   PLT 173  --     Cardiac Enzymes: No results found for this basename: CKTOTAL, CKMB, CKMBINDEX, TROPONINI,  in the last 168 hours  Lipid Panel:  Recent Labs Lab 02/22/14 0651  CHOL 190  TRIG 524*  HDL 32*  CHOLHDL 5.9  VLDL UNABLE TO CALCULATE IF TRIGLYCERIDE OVER 400 mg/dL  LDLCALC UNABLE TO CALCULATE IF TRIGLYCERIDE OVER 400 mg/dL    CBG:  Recent Labs Lab 02/22/14 1621 02/22/14 2155 02/23/14 0717 02/23/14 1139 02/23/14 1634  GLUCAP 172* 169* 172* 220* 190*    Microbiology: Results for orders placed during the hospital encounter of 02/21/14  URINE CULTURE     Status: None   Collection Time    02/21/14  8:57 PM      Result Value Ref Range Status   Specimen Description URINE, RANDOM   Final   Special Requests NONE   Final   Culture  Setup Time     Final   Value: 02/21/2014 21:56     Performed at [facility]   Colony Count     Final   Value: >=100,000 COLONIES/ML     [facility]formed at [facility]   Culture     Final   Value: ESCHERICHIA COLI     Performed at [facility]   Report Status 02/23/2014 FINAL   Final   Organism ID, Bacteria ESCHERICHIA COLI   Final    Coagulation Studies:  Recent Labs  02/21/14 1516  LABPROT 12.4  INR 0.92    Imaging: Dg Chest 2 View  02/22/2014   CLINICAL DATA:  Stroke protocol.  No current chest complaints.  EXAM: CHEST  2 VIEW  COMPARISON:  10/07/2013  FINDINGS: Shallow inspiration. The heart size and mediastinal contours are within normal limits. Both lungs are clear. The visualized  skeletal structures are unremarkable. Degenerative changes in the spine.  IMPRESSION: No active cardiopulmonary disease.   Electronically Signed   By: Burman NievesWilliam  Stevens M.D.   On: 02/22/2014 00:25   Mr Brain Wo Contrast  02/22/2014   CLINICAL DATA:  Left-sided weakness. 79 year old diabetic hypertensive female with history of dementia.  EXAM: MRI HEAD WITHOUT CONTRAST  MRA HEAD WITHOUT CONTRAST  TECHNIQUE: Multiplanar, multiecho pulse sequences of the brain and surrounding structures were obtained without intravenous contrast. Angiographic images of the head were obtained using MRA technique without contrast.  COMPARISON:  02/21/2014 CT.  No comparison MR.  FINDINGS: MRI HEAD FINDINGS  No acute infarct.  No intracranial hemorrhage.  Mild to moderate small vessel disease type changes.  No hydrocephalus.  No intracranial mass lesion noted on this unenhanced exam.  Major intracranial vascular structures are patent.  Partial opacification inferior left mastoid air cells. No obstructing lesion of drainage of the eustachian tube is noted.  Mild transverse ligament hypertrophy. Cervical medullary junction unremarkable. Partially empty sella without expansion incidentally noted. Orbital structures unremarkable.  MRA HEAD FINDINGS  Examination is motion degraded and may overestimate degree of below described stenosis.  Right internal carotid artery cavernous segment mild to moderate narrowing and irregularity. Left internal carotid artery cavernous segment mild narrowing and irregularity.  Areas of ectasia of the cavernous segment internal carotid artery and supraclinoid aspect bilateral. Small aneurysm cavernous segment left internal carotid artery cannot be excluded.  Moderate to marked narrowing M1 segment left middle cerebral artery.  Mild to moderate narrowing M1 segment right middle cerebral artery.  Moderate narrowing A1 and A2 segment right anterior cerebral artery.  Middle cerebral artery branch vessel mild  narrowing bilaterally.  Ectatic slightly irregular vertebral arteries bilaterally without high-grade stenosis.  Moderate stenosis right posterior inferior cerebellar artery.  Ectatic basilar artery without high-grade stenosis.  Nonvisualized anterior inferior cerebellar artery bilaterally.  Moderate tandem stenosis left superior cerebellar artery bilaterally.  Mild to moderate narrowing mid aspect left posterior cerebral artery.  IMPRESSION: MRI HEAD :  No acute infarct.  Mild to moderate small vessel disease type changes.  Partial opacification inferior left mastoid air cells.  MRA HEAD :  Examination is motion degraded and may overestimate degree of above described intracranial atherosclerotic type changes.  Small aneurysm cavernous segment left internal carotid artery cannot be excluded although this may be related to atherosclerotic disease and motion artifact.   Electronically Signed   By: Bridgett LarssonSteve  Olson M.D.   On: 02/22/2014 22:38   Mr Maxine GlennMra Head/brain Wo Cm  02/22/2014   CLINICAL DATA:  Left-sided weakness.  79 year old diabetic hypertensive female with history of dementia.  EXAM: MRI HEAD WITHOUT CONTRAST  MRA HEAD WITHOUT CONTRAST  TECHNIQUE: Multiplanar, multiecho pulse sequences of the brain and surrounding structures were obtained without intravenous contrast. Angiographic images of the head were obtained using MRA technique without contrast.  COMPARISON:  02/21/2014 CT.  No comparison MR.  FINDINGS: MRI HEAD FINDINGS  No acute infarct.  No intracranial hemorrhage.  Mild to moderate small vessel disease type changes.  No hydrocephalus.  No intracranial mass lesion noted on this unenhanced exam.  Major intracranial vascular structures are patent.  Partial opacification inferior left mastoid air cells. No obstructing lesion of drainage of the eustachian tube is noted.  Mild transverse ligament hypertrophy. Cervical medullary junction unremarkable. Partially empty sella without expansion incidentally noted.  Orbital structures unremarkable.  MRA HEAD FINDINGS  Examination is motion degraded and may overestimate degree of below described stenosis.  Right internal carotid artery cavernous segment mild to moderate narrowing and irregularity. Left internal carotid artery cavernous segment mild narrowing and irregularity.  Areas of ectasia of the cavernous segment internal carotid artery and supraclinoid aspect bilateral. Small aneurysm cavernous segment left internal carotid artery cannot be excluded.  Moderate to marked narrowing M1 segment left middle cerebral artery.  Mild to moderate narrowing M1 segment right middle cerebral artery.  Moderate narrowing A1 and A2 segment right anterior cerebral artery.  Middle cerebral artery branch vessel mild narrowing bilaterally.  Ectatic slightly irregular vertebral arteries bilaterally without high-grade stenosis.  Moderate stenosis right posterior inferior cerebellar artery.  Ectatic basilar artery without high-grade stenosis.  Nonvisualized anterior inferior cerebellar artery bilaterally.  Moderate tandem stenosis left superior cerebellar artery bilaterally.  Mild to moderate narrowing mid aspect left posterior cerebral artery.  IMPRESSION: MRI HEAD :  No acute infarct.  Mild to moderate small vessel disease type changes.  Partial opacification inferior left mastoid air cells.  MRA HEAD :  Examination is motion degraded and may overestimate degree of above described intracranial atherosclerotic type changes.  Small aneurysm cavernous segment left internal carotid artery cannot be excluded although this may be related to atherosclerotic disease and motion artifact.   Electronically Signed   By: Bridgett Larsson M.D.   On: 02/22/2014 22:38    Medications:  I have reviewed the patient's current medications. Scheduled: . cefpodoxime  200 mg Oral Q12H  . clopidogrel  75 mg Oral Daily  . enoxaparin (LOVENOX) injection  30 mg Subcutaneous Q24H  . ferrous sulfate  325 mg Oral Q  breakfast  . fesoterodine  4 mg Oral Daily  . insulin aspart  0-9 Units Subcutaneous TID WC  . losartan  50 mg Oral Daily  . metoprolol  100 mg Oral BID  . QUEtiapine  25 mg Oral BID  . senna-docusate  1 tablet Oral BID    Assessment/Plan: Patient appears at baseline.  MRI of the brain reviewed and shows no acute changes.  Carotid dopplers show no hemodynamically significant stenosis.  Echocardiogram is unremarkable.  Can not rule out TIA.  A1c 6.4. LDL unable to be calculated due to high triglycerides.    Recommendations: 1.  Lipid management 2.  Plavix 75 mg daily 3.  No further neurologic intervention is recommended at this time.  If further questions arise, please call or page at that time.  Thank you for allowing neurology to participate in the care of this patient.    LOS: 2 days   Thana Farr, MD [facility] [phone] 02/23/2014  6:23 PM

## 2014-02-23 NOTE — Progress Notes (Addendum)
[facility] PROGRESS NOTE  Abigail Brooks ZOX:[id number] DOB: 1935/06/22 DOA: 02/21/2014 PCP: Oneal Grout, MD  Brief Narrative: Abigail Brooks is a 79 y.o. female with PMH of DM, HTN, Dementia, CAD, CHF presented to ER with malaise and L sided weakness. Symptoms improved, MRI negative, now on plavix Treated for UTI Awaiting SNF  Assessment/Plan TIA / Left-sided weakness  -Improved -CT head unremarkable  -MRI/MRA negative -Neuro following, was on ASA 325mg  prior to admission, d/w NEuro will change to Plavix -2D ECHO with EF of 55-60% , CArotid duplex with 1-39% stenosis -lipids with High TG, hbaic 6.4 -PT/OT eval completed, has chronic gait disorder and with cognitive deficits, SNF recommended  Lumbar stenosis with neurogenic claudication  -continue home dose narcotics   Type II diabetes mellitus  -hold metformin, SSI   CAD  -continue ASA/metoprolol   Dementia with behavioral disturbance  -stable, continue home dose seroquel   Ecoli UTI  -Day 2 of ceftriaxone, change to PO Cefpodoxime for 3 more days  HTN -BP on higher side, restart losartan -continue metoprolol  DVT proph: lovenox  Code Status: Full Code Family Communication: none at bedside Disposition Plan: SNF tomorrow   Consultants:  NEuro  Antibiotics:  ceftraixone  HPI/Subjective: L side much improved   Objective: Filed Vitals:   02/23/14 1024  BP: 187/68  Pulse: 68  Temp: 98.2 F (36.8 C)  Resp: 18    Intake/Output Summary (Last 24 hours) at 02/23/14 1306 Last data filed at 02/23/14 1227  Gross per 24 hour  Intake    480 ml  Output      0 ml  Net    480 ml   Filed Weights   02/21/14 1519  Weight: 84.701 kg (186 lb 11.7 oz)    Exam:   General:  AAOx3, no distress  Cardiovascular: S1S2/RRR  Respiratory:CTAB  Abdomen:soft, Nt, BS present  Musculoskeletal: no edema c/c  Neuro: LUE and LLE now 5/5, no dysarthria either   Data Reviewed: Basic Metabolic  Panel:  Recent Labs Lab 02/21/14 1516 02/21/14 1528  NA 143 140  K 4.1 4.0  CL 100 103  CO2 27  --   GLUCOSE 194* 196*  BUN 25* 29*  CREATININE 1.13* 1.30*  CALCIUM 10.8*  --    Liver Function Tests:  Recent Labs Lab 02/21/14 1516  AST 15  ALT 22  ALKPHOS 64  BILITOT 0.5  PROT 6.7  ALBUMIN 3.4*   No results found for this basename: LIPASE, AMYLASE,  in the last 168 hours No results found for this basename: AMMONIA,  in the last 168 hours CBC:  Recent Labs Lab 02/21/14 1516 02/21/14 1528  WBC 7.8  --   NEUTROABS 5.4  --   HGB 14.0 14.6  HCT 40.7 43.0  MCV 91.1  --   PLT 173  --    Cardiac Enzymes: No results found for this basename: CKTOTAL, CKMB, CKMBINDEX, TROPONINI,  in the last 168 hours BNP (last 3 results) No results found for this basename: PROBNP,  in the last 8760 hours CBG:  Recent Labs Lab 02/22/14 1152 02/22/14 1621 02/22/14 2155 02/23/14 0717 02/23/14 1139  GLUCAP 165* 172* 169* 172* 220*    Recent Results (from the past 240 hour(s))  URINE CULTURE     Status: None   Collection Time    02/21/14  8:57 PM      Result Value Ref Range Status   Specimen Description URINE, RANDOM   Final   Special Requests  NONE   Final   Culture  Setup Time     Final   Value: 02/21/2014 21:56     Performed at [facility]   Colony Count     Final   Value: >=100,000 COLONIES/ML     Performed at [facility]   Culture     Final   Value: ESCHERICHIA COLI     Performed at [facility]   Report Status 02/23/2014 FINAL   Final   Organism ID, Bacteria ESCHERICHIA COLI   Final     Studies: Dg Chest 2 View  02/22/2014   CLINICAL DATA:  Stroke protocol.  No current chest complaints.  EXAM: CHEST  2 VIEW  COMPARISON:  10/07/2013  FINDINGS: Shallow inspiration. The heart size and mediastinal contours are within normal limits. Both lungs are clear. The visualized skeletal structures are unremarkable. Degenerative changes in the spine.   IMPRESSION: No active cardiopulmonary disease.   Electronically Signed   By: Burman NievesWilliam  Stevens M.D.   On: 02/22/2014 00:25   Ct Head Wo Contrast  02/21/2014   CLINICAL DATA:  Left-sided weakness  EXAM: CT HEAD WITHOUT CONTRAST  TECHNIQUE: Contiguous axial images were obtained from the base of the skull through the vertex without intravenous contrast.  COMPARISON:  10/07/2013  FINDINGS: No evidence of parenchymal hemorrhage or extra-axial fluid collection. No mass lesion, mass effect, or midline shift.  No CT evidence of acute infarction.  Subcortical white matter and periventricular small vessel ischemic changes. Intracranial atherosclerosis.  Cerebral volume is within normal limits.  No ventriculomegaly.  The visualized paranasal sinuses are essentially clear. Partial opacification of the left mastoid air cells.  No evidence of calvarial fracture.  IMPRESSION: No evidence of acute intracranial abnormality.  Small vessel ischemic changes with intracranial atherosclerosis.   Electronically Signed   By: Charline BillsSriyesh  Krishnan M.D.   On: 02/21/2014 15:19   Mr Brain Wo Contrast  02/22/2014   CLINICAL DATA:  Left-sided weakness. 79 year old diabetic hypertensive female with history of dementia.  EXAM: MRI HEAD WITHOUT CONTRAST  MRA HEAD WITHOUT CONTRAST  TECHNIQUE: Multiplanar, multiecho pulse sequences of the brain and surrounding structures were obtained without intravenous contrast. Angiographic images of the head were obtained using MRA technique without contrast.  COMPARISON:  02/21/2014 CT.  No comparison MR.  FINDINGS: MRI HEAD FINDINGS  No acute infarct.  No intracranial hemorrhage.  Mild to moderate small vessel disease type changes.  No hydrocephalus.  No intracranial mass lesion noted on this unenhanced exam.  Major intracranial vascular structures are patent.  Partial opacification inferior left mastoid air cells. No obstructing lesion of drainage of the eustachian tube is noted.  Mild transverse ligament  hypertrophy. Cervical medullary junction unremarkable. Partially empty sella without expansion incidentally noted. Orbital structures unremarkable.  MRA HEAD FINDINGS  Examination is motion degraded and may overestimate degree of below described stenosis.  Right internal carotid artery cavernous segment mild to moderate narrowing and irregularity. Left internal carotid artery cavernous segment mild narrowing and irregularity.  Areas of ectasia of the cavernous segment internal carotid artery and supraclinoid aspect bilateral. Small aneurysm cavernous segment left internal carotid artery cannot be excluded.  Moderate to marked narrowing M1 segment left middle cerebral artery.  Mild to moderate narrowing M1 segment right middle cerebral artery.  Moderate narrowing A1 and A2 segment right anterior cerebral artery.  Middle cerebral artery branch vessel mild narrowing bilaterally.  Ectatic slightly irregular vertebral arteries bilaterally without high-grade stenosis.  Moderate stenosis right posterior  inferior cerebellar artery.  Ectatic basilar artery without high-grade stenosis.  Nonvisualized anterior inferior cerebellar artery bilaterally.  Moderate tandem stenosis left superior cerebellar artery bilaterally.  Mild to moderate narrowing mid aspect left posterior cerebral artery.  IMPRESSION: MRI HEAD :  No acute infarct.  Mild to moderate small vessel disease type changes.  Partial opacification inferior left mastoid air cells.  MRA HEAD :  Examination is motion degraded and may overestimate degree of above described intracranial atherosclerotic type changes.  Small aneurysm cavernous segment left internal carotid artery cannot be excluded although this may be related to atherosclerotic disease and motion artifact.   Electronically Signed   By: Bridgett LarssonSteve  Olson M.D.   On: 02/22/2014 22:38   Mr Maxine GlennMra Head/brain Wo Cm  02/22/2014   CLINICAL DATA:  Left-sided weakness. 79 year old diabetic hypertensive female with history  of dementia.  EXAM: MRI HEAD WITHOUT CONTRAST  MRA HEAD WITHOUT CONTRAST  TECHNIQUE: Multiplanar, multiecho pulse sequences of the brain and surrounding structures were obtained without intravenous contrast. Angiographic images of the head were obtained using MRA technique without contrast.  COMPARISON:  02/21/2014 CT.  No comparison MR.  FINDINGS: MRI HEAD FINDINGS  No acute infarct.  No intracranial hemorrhage.  Mild to moderate small vessel disease type changes.  No hydrocephalus.  No intracranial mass lesion noted on this unenhanced exam.  Major intracranial vascular structures are patent.  Partial opacification inferior left mastoid air cells. No obstructing lesion of drainage of the eustachian tube is noted.  Mild transverse ligament hypertrophy. Cervical medullary junction unremarkable. Partially empty sella without expansion incidentally noted. Orbital structures unremarkable.  MRA HEAD FINDINGS  Examination is motion degraded and may overestimate degree of below described stenosis.  Right internal carotid artery cavernous segment mild to moderate narrowing and irregularity. Left internal carotid artery cavernous segment mild narrowing and irregularity.  Areas of ectasia of the cavernous segment internal carotid artery and supraclinoid aspect bilateral. Small aneurysm cavernous segment left internal carotid artery cannot be excluded.  Moderate to marked narrowing M1 segment left middle cerebral artery.  Mild to moderate narrowing M1 segment right middle cerebral artery.  Moderate narrowing A1 and A2 segment right anterior cerebral artery.  Middle cerebral artery branch vessel mild narrowing bilaterally.  Ectatic slightly irregular vertebral arteries bilaterally without high-grade stenosis.  Moderate stenosis right posterior inferior cerebellar artery.  Ectatic basilar artery without high-grade stenosis.  Nonvisualized anterior inferior cerebellar artery bilaterally.  Moderate tandem stenosis left superior  cerebellar artery bilaterally.  Mild to moderate narrowing mid aspect left posterior cerebral artery.  IMPRESSION: MRI HEAD :  No acute infarct.  Mild to moderate small vessel disease type changes.  Partial opacification inferior left mastoid air cells.  MRA HEAD :  Examination is motion degraded and may overestimate degree of above described intracranial atherosclerotic type changes.  Small aneurysm cavernous segment left internal carotid artery cannot be excluded although this may be related to atherosclerotic disease and motion artifact.   Electronically Signed   By: Bridgett LarssonSteve  Olson M.D.   On: 02/22/2014 22:38    Scheduled Meds: . cefTRIAXone (ROCEPHIN)  IV  1 g Intravenous Q24H  . clopidogrel  75 mg Oral Daily  . enoxaparin (LOVENOX) injection  30 mg Subcutaneous Q24H  . ferrous sulfate  325 mg Oral Q breakfast  . fesoterodine  4 mg Oral Daily  . insulin aspart  0-9 Units Subcutaneous TID WC  . metoprolol  100 mg Oral BID  . QUEtiapine  25 mg Oral BID  .  senna-docusate  1 tablet Oral BID   Continuous Infusions:  Antibiotics Given (last 72 hours)   Date/Time Action Medication Dose Rate   02/21/14 2108 Given   cefTRIAXone (ROCEPHIN) 1 g in dextrose 5 % 50 mL IVPB 1 g 100 mL/hr   02/22/14 1851 Given   cefTRIAXone (ROCEPHIN) 1 g in dextrose 5 % 50 mL IVPB 1 g 100 mL/hr      Principal Problem:   Left-sided weakness Active Problems:   Lumbar stenosis with neurogenic claudication   Type II or unspecified type diabetes mellitus without mention of complication, not stated as uncontrolled   CAD (coronary artery disease)   Chronic diastolic CHF (congestive heart failure)   Essential hypertension, benign   Dementia with behavioral disturbance   CVA (cerebral infarction)    Time spent:    [facility]  [facility] Pager [phone]. If 7PM-7AM, please contact night-coverage at [email or web address], password [email or web address] 02/23/2014, 1:06 PM  LOS: 2 days

## 2014-02-23 NOTE — Progress Notes (Signed)
Pt informed MD and RNCM that she has changed her mind concerning discharge plan--now would like to go to SNF for short-term rehab before returning home. Making referrals now, placement note to follow. PASARR pending.   Abigail LabradorJulie Dyan Labarbera, MSW, [facility] Clinical Social Worker [phone]

## 2014-02-23 NOTE — Progress Notes (Signed)
UR complete.  Travius Crochet RN, MSN 

## 2014-02-24 DIAGNOSIS — K5900 Constipation, unspecified: Secondary | ICD-10-CM | POA: Diagnosis not present

## 2014-02-24 DIAGNOSIS — G459 Transient cerebral ischemic attack, unspecified: Principal | ICD-10-CM

## 2014-02-24 DIAGNOSIS — R279 Unspecified lack of coordination: Secondary | ICD-10-CM | POA: Diagnosis not present

## 2014-02-24 DIAGNOSIS — F0390 Unspecified dementia without behavioral disturbance: Secondary | ICD-10-CM | POA: Diagnosis not present

## 2014-02-24 DIAGNOSIS — I10 Essential (primary) hypertension: Secondary | ICD-10-CM | POA: Diagnosis not present

## 2014-02-24 DIAGNOSIS — E785 Hyperlipidemia, unspecified: Secondary | ICD-10-CM

## 2014-02-24 DIAGNOSIS — N390 Urinary tract infection, site not specified: Secondary | ICD-10-CM

## 2014-02-24 DIAGNOSIS — R609 Edema, unspecified: Secondary | ICD-10-CM | POA: Diagnosis not present

## 2014-02-24 DIAGNOSIS — I5032 Chronic diastolic (congestive) heart failure: Secondary | ICD-10-CM | POA: Diagnosis not present

## 2014-02-24 DIAGNOSIS — Z5189 Encounter for other specified aftercare: Secondary | ICD-10-CM | POA: Diagnosis not present

## 2014-02-24 DIAGNOSIS — E119 Type 2 diabetes mellitus without complications: Secondary | ICD-10-CM

## 2014-02-24 DIAGNOSIS — R488 Other symbolic dysfunctions: Secondary | ICD-10-CM | POA: Diagnosis not present

## 2014-02-24 DIAGNOSIS — E569 Vitamin deficiency, unspecified: Secondary | ICD-10-CM | POA: Diagnosis not present

## 2014-02-24 DIAGNOSIS — F02818 Dementia in other diseases classified elsewhere, unspecified severity, with other behavioral disturbance: Secondary | ICD-10-CM | POA: Diagnosis not present

## 2014-02-24 DIAGNOSIS — I6789 Other cerebrovascular disease: Secondary | ICD-10-CM | POA: Diagnosis not present

## 2014-02-24 DIAGNOSIS — F0281 Dementia in other diseases classified elsewhere with behavioral disturbance: Secondary | ICD-10-CM | POA: Diagnosis not present

## 2014-02-24 DIAGNOSIS — R079 Chest pain, unspecified: Secondary | ICD-10-CM | POA: Diagnosis not present

## 2014-02-24 DIAGNOSIS — R4789 Other speech disturbances: Secondary | ICD-10-CM | POA: Diagnosis not present

## 2014-02-24 DIAGNOSIS — R52 Pain, unspecified: Secondary | ICD-10-CM | POA: Diagnosis not present

## 2014-02-24 DIAGNOSIS — M4800 Spinal stenosis, site unspecified: Secondary | ICD-10-CM | POA: Diagnosis not present

## 2014-02-24 DIAGNOSIS — M6281 Muscle weakness (generalized): Secondary | ICD-10-CM | POA: Diagnosis not present

## 2014-02-24 DIAGNOSIS — B379 Candidiasis, unspecified: Secondary | ICD-10-CM | POA: Diagnosis not present

## 2014-02-24 DIAGNOSIS — G4700 Insomnia, unspecified: Secondary | ICD-10-CM | POA: Diagnosis not present

## 2014-02-24 DIAGNOSIS — Q245 Malformation of coronary vessels: Secondary | ICD-10-CM | POA: Diagnosis not present

## 2014-02-24 LAB — GLUCOSE, CAPILLARY
GLUCOSE-CAPILLARY: 154 mg/dL — AB (ref 70–99)
Glucose-Capillary: 158 mg/dL — ABNORMAL HIGH (ref 70–99)

## 2014-02-24 MED ORDER — CLOPIDOGREL BISULFATE 75 MG PO TABS: 75.0000 mg | ORAL_TABLET | Freq: Every day | ORAL | Status: DC

## 2014-02-24 MED ORDER — HYDRALAZINE HCL 25 MG PO TABS
25.0000 mg | ORAL_TABLET | Freq: Three times a day (TID) | ORAL | Status: DC
  Administered 2014-02-24: 25 mg via ORAL
  Filled 2014-02-24: qty 1

## 2014-02-24 MED ORDER — FENOFIBRATE 54 MG PO TABS: 54.0000 mg | ORAL_TABLET | Freq: Every day | ORAL | Status: DC

## 2014-02-24 MED ORDER — ATORVASTATIN CALCIUM 10 MG PO TABS
10.0000 mg | ORAL_TABLET | Freq: Every day | ORAL | Status: DC
  Administered 2014-02-24: 10 mg via ORAL
  Filled 2014-02-24: qty 1

## 2014-02-24 MED ORDER — CEFPODOXIME PROXETIL 200 MG PO TABS: 200.0000 mg | ORAL_TABLET | Freq: Two times a day (BID) | ORAL | Status: DC

## 2014-02-24 MED ORDER — ACETAMINOPHEN 325 MG PO TABS
650.0000 mg | ORAL_TABLET | Freq: Once | ORAL | Status: AC
  Administered 2014-02-24: 650 mg via ORAL
  Filled 2014-02-24: qty 2

## 2014-02-24 MED ORDER — ATORVASTATIN CALCIUM 10 MG PO TABS: 10.0000 mg | ORAL_TABLET | Freq: Every day | ORAL | Status: DC

## 2014-02-24 MED ORDER — FENOFIBRATE 54 MG PO TABS
54.0000 mg | ORAL_TABLET | Freq: Every day | ORAL | Status: DC
  Filled 2014-02-24: qty 1

## 2014-02-24 NOTE — Progress Notes (Addendum)
[facility]. Pt's son-in-law is contacting facility now to do admissions paperwork. [facility]. FL2 has already been signed by MD, and CSW has this.  Addendum: pt's son-in-[facility] at [facility]pm [facility]. CSW to update him once PTAR is scheduled.  Maryclare LabradorJulie Prabhjot Maddux, MSW, [facility] Clinical Social Worker [phone]

## 2014-02-24 NOTE — Discharge Summary (Addendum)
Physician Discharge Summary  Abigail PitcherFrances L Brooks ZOX:[id number] DOB: [birth year] DOA: 02/21/2014  PCP: [name], MAHIMA, MD  Admit date: 02/21/2014 Discharge date: 02/24/2014  Time spent: >30 minutes  Recommendations for Outpatient Follow-up:  Follow-up Information   Please follow up. (SNF MD in 1-2days)       Follow up with [name],[name], MD. (in 2mos, call for appt )    Specialties:  Neurology, Radiology   Contact information:   [address] [place] [address] [phone]        Discharge Diagnoses:  Principal Problem:   Left-sided weakness Active Problems:   Lumbar stenosis with neurogenic claudication   Type II or unspecified type diabetes mellitus without mention of complication, not stated as uncontrolled   CAD (coronary artery disease)   Chronic diastolic CHF (congestive heart failure)   Essential hypertension, benign   Dementia with behavioral disturbance   CVA (cerebral infarction)   TIA (transient ischemic attack)  hyperlipidemia  Discharge Condition: Improved/stable  Diet recommendation: Low-sodium modified carbohydrate  Filed Weights   02/21/14 1519  Weight: 84.701 kg (186 lb 11.7 oz)    History of present illness:  Patient is a 79 y.o. female with PMH of DM, HTN, Dementia, CAD, CHF was in her usual state of health this am.  She felt a little weak, went to church left early, was driven home by a friend, then fixed herself a sandwich, after this was talking to her son on the telephone who suddenly noted slurring of her speech and then she dropped the phone.  Her son rushed to her house and found her slurred, unable to move her L arm and leg.  EMS was called and she was brought to the ER.  While in ER, Seen by Neurology, Her weakness was improving, not back to baseline yet. She was admitted for further evaluation and management.    Hospital Course:  TIA / Left-sided weakness  -As discussed above upon admission CT head unremarkable, MRI/MRA  negative. Left-sided weakness improved on followup. -was on ASA 325mg  prior to admission, neuro was consulted on admission followed and changed her to Plavix which she is to continue upon discharge. -2D ECHO with EF of 55-60% , CArotid duplex with 1-39% stenosis  -lipids with High TG, LDL 107>> started on fenofibrate and Lipitor, she is to followup outpatient for followup recheck and further adjustment of her meds as clinically appropriate to optimize lipid management. Her baseline LFTs on 7/5 within normal limits. A1c was done and 6.4 -PT/OT eval completed, has chronic gait disorder and with cognitive deficits, SNF recommended  Lumbar stenosis with neurogenic claudication  -continue home dose narcotics  Type II diabetes mellitus  -She was covered with sliding scale insulin while in the hospital, she is to continue her metformin upon discharge -As above for A1c 6.4 CAD  -continue ASA/metoprolol  Dementia with behavioral disturbance  -stable, continue home dose seroquel  Ecoli UTI  -She was initially started on Rocephin change to PO Cefpodoxime she is to continue for 2 more days on discharge-40-5 days of antibiotics. HTN  -She is to continue her metoprolol losartan and hydralazine upon discharge Hyperlipidemia -Patient had lipid panel done and it revealed total cholesterol of 190 was triglycerides of 524 and HDL of 32. As discussed above she was started on fenofibrate and Lipitor which she is to continue upon discharge. She is to followup with PCP for further management as above    Procedures: 2-D echo Study Conclusions  - Procedure narrative:  Transthoracic echocardiography. Image quality was adequate. The study was technically difficult. - Left ventricle: The cavity size was normal. Wall thickness was increased in a pattern of moderate LVH. Systolic function was normal. The estimated ejection fraction was in the range of 55% to 60%. Wall motion was normal; there were no regional  wall motion abnormalities. - Mitral valve: Calcified annulus. - Atrial septum: No defect or patent foramen ovale was identified. - Pericardium, extracardiac: A trivial pericardial effusion was identified posterior to the heart. Carotid Dopplers Bilateral carotid artery duplex: 1-39% ICA stenosis. Vertebral artery flow is antegrade.   Consultations:  Neurology  Discharge Exam: Filed Vitals:   02/24/14 1008  BP: 173/79  Pulse: 63  Temp: 98.4 F (36.9 C)  Resp: 18    Exam:  General: AAOx3, no distress  Cardiovascular: S1S2/RRR  Respiratory:CTAB  Abdomen:soft, Nt, BS present  Musculoskeletal: no edema c/c  Neuro: LUE and LLE now 5/5, no dysarthria    Discharge Instructions You were cared for by a hospitalist during your hospital stay. If you have any questions about your discharge medications or the care you received while you were in the hospital after you are discharged, you can call the unit and asked to speak with the hospitalist on call if the hospitalist that took care of you is not available. Once you are discharged, your primary care physician will handle any further medical issues. Please note that NO REFILLS for any discharge medications will be authorized once you are discharged, as it is imperative that you return to your primary care physician (or establish a relationship with a primary care physician if you do not have one) for your aftercare needs so that they can reassess your need for medications and monitor your lab values.  Discharge Instructions   Diet Carb Modified    Complete by:  As directed      Increase activity slowly    Complete by:  As directed             Medication List    STOP taking these medications       aspirin EC 325 MG tablet      TAKE these medications       atorvastatin 10 MG tablet  Commonly known as:  LIPITOR  Take 1 tablet (10 mg total) by mouth daily at 6 PM.     CALCIUM + D PO  Take 1 tablet by mouth daily. Calcium 315  mg,Vitamin D3 250 I.U.     cefpodoxime 200 MG tablet  Commonly known as:  VANTIN  Take 1 tablet (200 mg total) by mouth every 12 (twelve) hours.     clonazePAM 0.5 MG tablet  Commonly known as:  KLONOPIN  Take 0.5 mg by mouth 3 (three) times daily.     clopidogrel 75 MG tablet  Commonly known as:  PLAVIX  Take 1 tablet (75 mg total) by mouth daily.     fenofibrate 54 MG tablet  Take 1 tablet (54 mg total) by mouth daily.     ferrous sulfate 325 (65 FE) MG tablet  Take 325 mg by mouth daily with breakfast.     fesoterodine 4 MG Tb24 tablet  Commonly known as:  TOVIAZ  Take 4 mg by mouth daily.     fish oil-omega-3 fatty acids 1000 MG capsule  Take 1 g by mouth daily.     hydrALAZINE 25 MG tablet  Commonly known as:  APRESOLINE  Take 25 mg by mouth 3 (three)  times daily.     losartan-hydrochlorothiazide 50-12.5 MG per tablet  Commonly known as:  HYZAAR  Take 1 tablet by mouth daily.     metFORMIN 500 MG tablet  Commonly known as:  GLUCOPHAGE  Take 1 tablet (500 mg total) by mouth daily with breakfast.     metoprolol 100 MG tablet  Commonly known as:  LOPRESSOR  Take 1 tablet (100 mg total) by mouth 2 (two) times daily.     nitroGLYCERIN 0.4 MG SL tablet  Commonly known as:  NITROSTAT  Place 0.4 mg under the tongue every 5 (five) minutes as needed for chest pain. Max 3 tablets in 15 minutes     oxyCODONE-acetaminophen 5-325 MG per tablet  Commonly known as:  PERCOCET/ROXICET  Take 1-2 tablets by mouth every 6 (six) hours as needed for severe pain (pain).     polyethylene glycol packet  Commonly known as:  MIRALAX / GLYCOLAX  Take 17 g by mouth daily as needed (pain).     polyethylene glycol powder powder  Commonly known as:  GLYCOLAX/MIRALAX  Take 17 g by mouth daily as needed.     QUEtiapine 25 MG tablet  Commonly known as:  SEROQUEL  Take 1 tablet (25 mg total) by mouth 2 (two) times daily. Take 1 tablet twice daily     SENNA S 8.6-50 MG per tablet   Generic drug:  senna-docusate  Take 1 tablet by mouth 2 (two) times daily.       Allergies  Allergen Reactions  . Enalapril Swelling    Facial swelling  . Morphine And Related Other (See Comments)    Makes her crazy      The results of significant diagnostics from this hospitalization (including imaging, microbiology, ancillary and laboratory) are listed below for reference.    Significant Diagnostic Studies: Dg Chest 2 View  02/22/2014   CLINICAL DATA:  Stroke protocol.  No current chest complaints.  EXAM: CHEST  2 VIEW  COMPARISON:  10/07/2013  FINDINGS: Shallow inspiration. The heart size and mediastinal contours are within normal limits. Both lungs are clear. The visualized skeletal structures are unremarkable. Degenerative changes in the spine.  IMPRESSION: No active cardiopulmonary disease.   Electronically Signed   By: Burman Nieves M.D.   On: 02/22/2014 00:25   Ct Head Wo Contrast  02/21/2014   CLINICAL DATA:  Left-sided weakness  EXAM: CT HEAD WITHOUT CONTRAST  TECHNIQUE: Contiguous axial images were obtained from the base of the skull through the vertex without intravenous contrast.  COMPARISON:  10/07/2013  FINDINGS: No evidence of parenchymal hemorrhage or extra-axial fluid collection. No mass lesion, mass effect, or midline shift.  No CT evidence of acute infarction.  Subcortical white matter and periventricular small vessel ischemic changes. Intracranial atherosclerosis.  Cerebral volume is within normal limits.  No ventriculomegaly.  The visualized paranasal sinuses are essentially clear. Partial opacification of the left mastoid air cells.  No evidence of calvarial fracture.  IMPRESSION: No evidence of acute intracranial abnormality.  Small vessel ischemic changes with intracranial atherosclerosis.   Electronically Signed   By: Charline Bills M.D.   On: 02/21/2014 15:19   Mr Brain Wo Contrast  02/22/2014   CLINICAL DATA:  Left-sided weakness. 79 year old diabetic  hypertensive female with history of dementia.  EXAM: MRI HEAD WITHOUT CONTRAST  MRA HEAD WITHOUT CONTRAST  TECHNIQUE: Multiplanar, multiecho pulse sequences of the brain and surrounding structures were obtained without intravenous contrast. Angiographic images of the head were obtained using MRA  technique without contrast.  COMPARISON:  02/21/2014 CT.  No comparison MR.  FINDINGS: MRI HEAD FINDINGS  No acute infarct.  No intracranial hemorrhage.  Mild to moderate small vessel disease type changes.  No hydrocephalus.  No intracranial mass lesion noted on this unenhanced exam.  Major intracranial vascular structures are patent.  Partial opacification inferior left mastoid air cells. No obstructing lesion of drainage of the eustachian tube is noted.  Mild transverse ligament hypertrophy. Cervical medullary junction unremarkable. Partially empty sella without expansion incidentally noted. Orbital structures unremarkable.  MRA HEAD FINDINGS  Examination is motion degraded and may overestimate degree of below described stenosis.  Right internal carotid artery cavernous segment mild to moderate narrowing and irregularity. Left internal carotid artery cavernous segment mild narrowing and irregularity.  Areas of ectasia of the cavernous segment internal carotid artery and supraclinoid aspect bilateral. Small aneurysm cavernous segment left internal carotid artery cannot be excluded.  Moderate to marked narrowing M1 segment left middle cerebral artery.  Mild to moderate narrowing M1 segment right middle cerebral artery.  Moderate narrowing A1 and A2 segment right anterior cerebral artery.  Middle cerebral artery branch vessel mild narrowing bilaterally.  Ectatic slightly irregular vertebral arteries bilaterally without high-grade stenosis.  Moderate stenosis right posterior inferior cerebellar artery.  Ectatic basilar artery without high-grade stenosis.  Nonvisualized anterior inferior cerebellar artery bilaterally.  Moderate  tandem stenosis left superior cerebellar artery bilaterally.  Mild to moderate narrowing mid aspect left posterior cerebral artery.  IMPRESSION: MRI HEAD :  No acute infarct.  Mild to moderate small vessel disease type changes.  Partial opacification inferior left mastoid air cells.  MRA HEAD :  Examination is motion degraded and may overestimate degree of above described intracranial atherosclerotic type changes.  Small aneurysm cavernous segment left internal carotid artery cannot be excluded although this may be related to atherosclerotic disease and motion artifact.   Electronically Signed   By: Bridgett Larsson M.D.   On: 02/22/2014 22:38   Mr Maxine Glenn Head/brain Wo Cm  02/22/2014   CLINICAL DATA:  Left-sided weakness. 79 year old diabetic hypertensive female with history of dementia.  EXAM: MRI HEAD WITHOUT CONTRAST  MRA HEAD WITHOUT CONTRAST  TECHNIQUE: Multiplanar, multiecho pulse sequences of the brain and surrounding structures were obtained without intravenous contrast. Angiographic images of the head were obtained using MRA technique without contrast.  COMPARISON:  02/21/2014 CT.  No comparison MR.  FINDINGS: MRI HEAD FINDINGS  No acute infarct.  No intracranial hemorrhage.  Mild to moderate small vessel disease type changes.  No hydrocephalus.  No intracranial mass lesion noted on this unenhanced exam.  Major intracranial vascular structures are patent.  Partial opacification inferior left mastoid air cells. No obstructing lesion of drainage of the eustachian tube is noted.  Mild transverse ligament hypertrophy. Cervical medullary junction unremarkable. Partially empty sella without expansion incidentally noted. Orbital structures unremarkable.  MRA HEAD FINDINGS  Examination is motion degraded and may overestimate degree of below described stenosis.  Right internal carotid artery cavernous segment mild to moderate narrowing and irregularity. Left internal carotid artery cavernous segment mild narrowing and  irregularity.  Areas of ectasia of the cavernous segment internal carotid artery and supraclinoid aspect bilateral. Small aneurysm cavernous segment left internal carotid artery cannot be excluded.  Moderate to marked narrowing M1 segment left middle cerebral artery.  Mild to moderate narrowing M1 segment right middle cerebral artery.  Moderate narrowing A1 and A2 segment right anterior cerebral artery.  Middle cerebral artery branch vessel mild narrowing bilaterally.  Ectatic slightly irregular vertebral arteries bilaterally without high-grade stenosis.  Moderate stenosis right posterior inferior cerebellar artery.  Ectatic basilar artery without high-grade stenosis.  Nonvisualized anterior inferior cerebellar artery bilaterally.  Moderate tandem stenosis left superior cerebellar artery bilaterally.  Mild to moderate narrowing mid aspect left posterior cerebral artery.  IMPRESSION: MRI HEAD :  No acute infarct.  Mild to moderate small vessel disease type changes.  Partial opacification inferior left mastoid air cells.  MRA HEAD :  Examination is motion degraded and may overestimate degree of above described intracranial atherosclerotic type changes.  Small aneurysm cavernous segment left internal carotid artery cannot be excluded although this may be related to atherosclerotic disease and motion artifact.   Electronically Signed   By: Bridgett Larsson M.D.   On: 02/22/2014 22:38    Microbiology: Recent Results (from the past 240 hour(s))  URINE CULTURE     Status: None   Collection Time    02/21/14  8:57 PM      Result Value Ref Range Status   Specimen Description URINE, RANDOM   Final   Special Requests NONE   Final   Culture  Setup Time     Final   Value: 02/21/2014 21:56     Performed at [facility] Count     Final   Value: >=100,000 COLONIES/ML     Performed at [facility]   Culture     Final   Value: ESCHERICHIA COLI     Performed at [facility]   Report  Status 02/23/2014 FINAL   Final   Organism ID, Bacteria ESCHERICHIA COLI   Final     Labs: Basic Metabolic Panel:  Recent Labs Lab 02/21/14 1516 02/21/14 1528  NA 143 140  K 4.1 4.0  CL 100 103  CO2 27  --   GLUCOSE 194* 196*  BUN 25* 29*  CREATININE 1.13* 1.30*  CALCIUM 10.8*  --    Liver Function Tests:  Recent Labs Lab 02/21/14 1516  AST 15  ALT 22  ALKPHOS 64  BILITOT 0.5  PROT 6.7  ALBUMIN 3.4*   No results found for this basename: LIPASE, AMYLASE,  in the last 168 hours No results found for this basename: AMMONIA,  in the last 168 hours CBC:  Recent Labs Lab 02/21/14 1516 02/21/14 1528  WBC 7.8  --   NEUTROABS 5.4  --   HGB 14.0 14.6  HCT 40.7 43.0  MCV 91.1  --   PLT 173  --    Cardiac Enzymes: No results found for this basename: CKTOTAL, CKMB, CKMBINDEX, TROPONINI,  in the last 168 hours BNP: BNP (last 3 results) No results found for this basename: PROBNP,  in the last 8760 hours CBG:  Recent Labs Lab 02/23/14 0717 02/23/14 1139 02/23/14 1634 02/23/14 2149 02/24/14 0632  GLUCAP 172* 220* 190* 167* 154*       Signed:  Dewaun Kinzler C  [facility] 02/24/2014, 11:43 AM

## 2014-02-24 NOTE — Progress Notes (Signed)
D/C orders received. Pt notified and verbalized understanding. D/C packet given to [facility]. [facility]. Writer has attempted to call report to SNF several times but has been unable to reach anyone. Will continue to attempt report.

## 2014-02-25 DIAGNOSIS — N390 Urinary tract infection, site not specified: Secondary | ICD-10-CM | POA: Diagnosis not present

## 2014-02-25 DIAGNOSIS — E119 Type 2 diabetes mellitus without complications: Secondary | ICD-10-CM | POA: Diagnosis not present

## 2014-02-25 DIAGNOSIS — I10 Essential (primary) hypertension: Secondary | ICD-10-CM | POA: Diagnosis not present

## 2014-02-25 DIAGNOSIS — G459 Transient cerebral ischemic attack, unspecified: Secondary | ICD-10-CM | POA: Diagnosis not present

## 2014-03-01 DIAGNOSIS — B379 Candidiasis, unspecified: Secondary | ICD-10-CM | POA: Diagnosis not present

## 2014-03-02 ENCOUNTER — Ambulatory Visit: Payer: Medicare Other | Admitting: Internal Medicine

## 2014-03-16 ENCOUNTER — Ambulatory Visit: Payer: Medicare Other | Admitting: Internal Medicine

## 2014-03-30 DIAGNOSIS — F411 Generalized anxiety disorder: Secondary | ICD-10-CM | POA: Diagnosis not present

## 2014-03-30 DIAGNOSIS — F329 Major depressive disorder, single episode, unspecified: Secondary | ICD-10-CM | POA: Diagnosis not present

## 2014-03-30 DIAGNOSIS — Z9181 History of falling: Secondary | ICD-10-CM | POA: Diagnosis not present

## 2014-03-30 DIAGNOSIS — F03918 Unspecified dementia, unspecified severity, with other behavioral disturbance: Secondary | ICD-10-CM | POA: Diagnosis not present

## 2014-03-30 DIAGNOSIS — E119 Type 2 diabetes mellitus without complications: Secondary | ICD-10-CM | POA: Diagnosis not present

## 2014-03-30 DIAGNOSIS — I509 Heart failure, unspecified: Secondary | ICD-10-CM | POA: Diagnosis not present

## 2014-03-30 DIAGNOSIS — F3289 Other specified depressive episodes: Secondary | ICD-10-CM | POA: Diagnosis not present

## 2014-03-30 DIAGNOSIS — I2510 Atherosclerotic heart disease of native coronary artery without angina pectoris: Secondary | ICD-10-CM | POA: Diagnosis not present

## 2014-03-30 DIAGNOSIS — M1990 Unspecified osteoarthritis, unspecified site: Secondary | ICD-10-CM | POA: Diagnosis not present

## 2014-03-30 DIAGNOSIS — I69959 Hemiplegia and hemiparesis following unspecified cerebrovascular disease affecting unspecified side: Secondary | ICD-10-CM | POA: Diagnosis not present

## 2014-03-30 DIAGNOSIS — I10 Essential (primary) hypertension: Secondary | ICD-10-CM | POA: Diagnosis not present

## 2014-03-30 DIAGNOSIS — F0391 Unspecified dementia with behavioral disturbance: Secondary | ICD-10-CM | POA: Diagnosis not present

## 2014-04-02 ENCOUNTER — Emergency Department (HOSPITAL_COMMUNITY): Payer: Medicare Other

## 2014-04-02 ENCOUNTER — Emergency Department (HOSPITAL_COMMUNITY)
Admission: EM | Admit: 2014-04-02 | Discharge: 2014-04-02 | Disposition: A | Discharge status: Home / Self Care | Payer: Medicare Other | Attending: Emergency Medicine | Admitting: Emergency Medicine

## 2014-04-02 ENCOUNTER — Encounter (HOSPITAL_COMMUNITY): Payer: Self-pay | Admitting: Emergency Medicine

## 2014-04-02 DIAGNOSIS — Z862 Personal history of diseases of the blood and blood-forming organs and certain disorders involving the immune mechanism: Secondary | ICD-10-CM | POA: Diagnosis not present

## 2014-04-02 DIAGNOSIS — Z7902 Long term (current) use of antithrombotics/antiplatelets: Secondary | ICD-10-CM | POA: Insufficient documentation

## 2014-04-02 DIAGNOSIS — F329 Major depressive disorder, single episode, unspecified: Secondary | ICD-10-CM | POA: Diagnosis not present

## 2014-04-02 DIAGNOSIS — I10 Essential (primary) hypertension: Secondary | ICD-10-CM | POA: Insufficient documentation

## 2014-04-02 DIAGNOSIS — E559 Vitamin D deficiency, unspecified: Secondary | ICD-10-CM | POA: Insufficient documentation

## 2014-04-02 DIAGNOSIS — R059 Cough, unspecified: Secondary | ICD-10-CM | POA: Insufficient documentation

## 2014-04-02 DIAGNOSIS — I2510 Atherosclerotic heart disease of native coronary artery without angina pectoris: Secondary | ICD-10-CM | POA: Diagnosis not present

## 2014-04-02 DIAGNOSIS — M129 Arthropathy, unspecified: Secondary | ICD-10-CM | POA: Insufficient documentation

## 2014-04-02 DIAGNOSIS — Z8719 Personal history of other diseases of the digestive system: Secondary | ICD-10-CM | POA: Insufficient documentation

## 2014-04-02 DIAGNOSIS — Z8669 Personal history of other diseases of the nervous system and sense organs: Secondary | ICD-10-CM | POA: Diagnosis not present

## 2014-04-02 DIAGNOSIS — Z87891 Personal history of nicotine dependence: Secondary | ICD-10-CM | POA: Diagnosis not present

## 2014-04-02 DIAGNOSIS — E119 Type 2 diabetes mellitus without complications: Secondary | ICD-10-CM | POA: Diagnosis not present

## 2014-04-02 DIAGNOSIS — Z87440 Personal history of urinary (tract) infections: Secondary | ICD-10-CM | POA: Diagnosis not present

## 2014-04-02 DIAGNOSIS — F3289 Other specified depressive episodes: Secondary | ICD-10-CM | POA: Insufficient documentation

## 2014-04-02 DIAGNOSIS — Z79899 Other long term (current) drug therapy: Secondary | ICD-10-CM | POA: Insufficient documentation

## 2014-04-02 DIAGNOSIS — S298XXA Other specified injuries of thorax, initial encounter: Secondary | ICD-10-CM | POA: Diagnosis not present

## 2014-04-02 DIAGNOSIS — R296 Repeated falls: Secondary | ICD-10-CM | POA: Diagnosis not present

## 2014-04-02 DIAGNOSIS — F0390 Unspecified dementia without behavioral disturbance: Secondary | ICD-10-CM | POA: Insufficient documentation

## 2014-04-02 DIAGNOSIS — R5381 Other malaise: Secondary | ICD-10-CM | POA: Insufficient documentation

## 2014-04-02 DIAGNOSIS — R05 Cough: Secondary | ICD-10-CM | POA: Insufficient documentation

## 2014-04-02 DIAGNOSIS — S0990XA Unspecified injury of head, initial encounter: Secondary | ICD-10-CM | POA: Diagnosis not present

## 2014-04-02 DIAGNOSIS — I252 Old myocardial infarction: Secondary | ICD-10-CM | POA: Diagnosis not present

## 2014-04-02 DIAGNOSIS — Y92009 Unspecified place in unspecified non-institutional (private) residence as the place of occurrence of the external cause: Secondary | ICD-10-CM | POA: Diagnosis not present

## 2014-04-02 DIAGNOSIS — R404 Transient alteration of awareness: Secondary | ICD-10-CM | POA: Diagnosis not present

## 2014-04-02 DIAGNOSIS — E538 Deficiency of other specified B group vitamins: Secondary | ICD-10-CM | POA: Insufficient documentation

## 2014-04-02 DIAGNOSIS — F411 Generalized anxiety disorder: Secondary | ICD-10-CM | POA: Diagnosis not present

## 2014-04-02 DIAGNOSIS — Y939 Activity, unspecified: Secondary | ICD-10-CM | POA: Diagnosis not present

## 2014-04-02 DIAGNOSIS — E785 Hyperlipidemia, unspecified: Secondary | ICD-10-CM | POA: Diagnosis not present

## 2014-04-02 DIAGNOSIS — R5383 Other fatigue: Secondary | ICD-10-CM | POA: Diagnosis not present

## 2014-04-02 DIAGNOSIS — Z9861 Coronary angioplasty status: Secondary | ICD-10-CM | POA: Diagnosis not present

## 2014-04-02 DIAGNOSIS — I509 Heart failure, unspecified: Secondary | ICD-10-CM | POA: Insufficient documentation

## 2014-04-02 DIAGNOSIS — W19XXXA Unspecified fall, initial encounter: Secondary | ICD-10-CM

## 2014-04-02 DIAGNOSIS — Z043 Encounter for examination and observation following other accident: Secondary | ICD-10-CM | POA: Insufficient documentation

## 2014-04-02 LAB — COMPREHENSIVE METABOLIC PANEL
ALBUMIN: 3.5 g/dL (ref 3.5–5.2)
ALK PHOS: 59 U/L (ref 39–117)
ALT: 18 U/L (ref 0–35)
ANION GAP: 14 (ref 5–15)
AST: 14 U/L (ref 0–37)
BUN: 28 mg/dL — ABNORMAL HIGH (ref 6–23)
CO2: 27 mEq/L (ref 19–32)
Calcium: 9.8 mg/dL (ref 8.4–10.5)
Chloride: 103 mEq/L (ref 96–112)
Creatinine, Ser: 1.16 mg/dL — ABNORMAL HIGH (ref 0.50–1.10)
GFR calc Af Amer: 51 mL/min — ABNORMAL LOW (ref >90)
GFR calc non Af Amer: 44 mL/min — ABNORMAL LOW (ref >90)
Glucose, Bld: 125 mg/dL — ABNORMAL HIGH (ref 70–99)
POTASSIUM: 4 meq/L (ref 3.7–5.3)
SODIUM: 144 meq/L (ref 137–147)
Total Bilirubin: 0.4 mg/dL (ref 0.3–1.2)
Total Protein: 6.5 g/dL (ref 6.0–8.3)

## 2014-04-02 LAB — CBC WITH DIFFERENTIAL/PLATELET
BASOS PCT: 1 % (ref 0–1)
Basophils Absolute: 0.1 10*3/uL (ref 0.0–0.1)
Eosinophils Absolute: 0.7 10*3/uL (ref 0.0–0.7)
Eosinophils Relative: 8 % — ABNORMAL HIGH (ref 0–5)
HEMATOCRIT: 39.5 % (ref 36.0–46.0)
HEMOGLOBIN: 13.1 g/dL (ref 12.0–15.0)
Lymphocytes Relative: 20 % (ref 12–46)
Lymphs Abs: 1.7 10*3/uL (ref 0.7–4.0)
MCH: 30.7 pg (ref 26.0–34.0)
MCHC: 33.2 g/dL (ref 30.0–36.0)
MCV: 92.5 fL (ref 78.0–100.0)
MONO ABS: 0.5 10*3/uL (ref 0.1–1.0)
MONOS PCT: 6 % (ref 3–12)
NEUTROS ABS: 5.8 10*3/uL (ref 1.7–7.7)
Neutrophils Relative %: 67 % (ref 43–77)
Platelets: 201 10*3/uL (ref 150–400)
RBC: 4.27 MIL/uL (ref 3.87–5.11)
RDW: 12.8 % (ref 11.5–15.5)
WBC: 8.7 10*3/uL (ref 4.0–10.5)

## 2014-04-02 LAB — URINALYSIS, ROUTINE W REFLEX MICROSCOPIC
Bilirubin Urine: NEGATIVE
Glucose, UA: NEGATIVE mg/dL
HGB URINE DIPSTICK: NEGATIVE
Ketones, ur: NEGATIVE mg/dL
Nitrite: NEGATIVE
PROTEIN: NEGATIVE mg/dL
Specific Gravity, Urine: 1.02 (ref 1.005–1.030)
Urobilinogen, UA: 0.2 mg/dL (ref 0.0–1.0)
pH: 5 (ref 5.0–8.0)

## 2014-04-02 LAB — CK: CK TOTAL: 49 U/L (ref 7–177)

## 2014-04-02 LAB — URINE MICROSCOPIC-ADD ON

## 2014-04-02 LAB — TROPONIN I: Troponin I: <0.3 ng/mL (ref <0.30)

## 2014-04-02 NOTE — Discharge Instructions (Signed)
Fall Prevention and Home Safety Home health nurse will be your home tomorrow Falls cause injuries and can affect all age groups. It is possible to use preventive measures to significantly decrease the likelihood of falls. There are many simple measures which can make your home safer and prevent falls. OUTDOORS  Repair cracks and edges of walkways and driveways.  Remove high doorway thresholds.  Trim shrubbery on the main path into your home.  Have good outside lighting.  Clear walkways of tools, rocks, debris, and clutter.  Check that handrails are not broken and are securely fastened. Both sides of steps should have handrails.  Have leaves, snow, and ice cleared regularly.  Use sand or salt on walkways during winter months.  In the garage, clean up grease or oil spills. BATHROOM  Install night lights.  Install grab bars by the toilet and in the tub and shower.  Use non-skid mats or decals in the tub or shower.  Place a plastic non-slip stool in the shower to sit on, if needed.  Keep floors dry and clean up all water on the floor immediately.  Remove soap buildup in the tub or shower on a regular basis.  Secure bath mats with non-slip, double-sided rug tape.  Remove throw rugs and tripping hazards from the floors. BEDROOMS  Install night lights.  Make sure a bedside light is easy to reach.  Do not use oversized bedding.  Keep a telephone by your bedside.  Have a firm chair with side arms to use for getting dressed.  Remove throw rugs and tripping hazards from the floor. KITCHEN  Keep handles on pots and pans turned toward the center of the stove. Use back burners when possible.  Clean up spills quickly and allow time for drying.  Avoid walking on wet floors.  Avoid hot utensils and knives.  Position shelves so they are not too high or low.  Place commonly used objects within easy reach.  If necessary, use a sturdy step stool with a grab bar when  reaching.  Keep electrical cables out of the way.  Do not use floor polish or wax that makes floors slippery. If you must use wax, use non-skid floor wax.  Remove throw rugs and tripping hazards from the floor. STAIRWAYS  Never leave objects on stairs.  Place handrails on both sides of stairways and use them. Fix any loose handrails. Make sure handrails on both sides of the stairways are as long as the stairs.  Check carpeting to make sure it is firmly attached along stairs. Make repairs to worn or loose carpet promptly.  Avoid placing throw rugs at the top or bottom of stairways, or properly secure the rug with carpet tape to prevent slippage. Get rid of throw rugs, if possible.  Have an electrician put in a light switch at the top and bottom of the stairs. OTHER FALL PREVENTION TIPS  Wear low-heel or rubber-soled shoes that are supportive and fit well. Wear closed toe shoes.  When using a stepladder, make sure it is fully opened and both spreaders are firmly locked. Do not climb a closed stepladder.  Add color or contrast paint or tape to grab bars and handrails in your home. Place contrasting color strips on first and last steps.  Learn and use mobility aids as needed. Install an electrical emergency response system.  Turn on lights to avoid dark areas. Replace light bulbs that burn out immediately. Get light switches that glow.  Arrange furniture to create  clear pathways. Keep furniture in the same place.  Firmly attach carpet with non-skid or double-sided tape.  Eliminate uneven floor surfaces.  Select a carpet pattern that does not visually hide the edge of steps.  Be aware of all pets. OTHER HOME SAFETY TIPS  Set the water temperature for 120 F (48.8 C).  Keep emergency numbers on or near the telephone.  Keep smoke detectors on every level of the home and near sleeping areas. Document Released: 07/27/2002 Document Revised: 02/05/2012 Document Reviewed:  10/26/2011 [facility] Patient Information 2015 [place], [place]. This information is not intended to replace advice given to you by your health care provider. Make sure you discuss any questions you have with your health care provider.

## 2014-04-02 NOTE — ED Notes (Signed)
Pt remains monitored by 5 lead, blood pressure, and pulse ox. Pts family remains at bedside.  

## 2014-04-02 NOTE — ED Notes (Signed)
Pt from home via EMS with c/o 2 falls in the past 24 hours. Pt. Denies n/v, dizziness, neck or back pain. Pt was recently discharged from rehab for possible CVA with left sided weakness. Pt c/o pain to left elbow and right knee. Hematoma noted to back left head. Pt takes plavix. NAD noted at this time.

## 2014-04-02 NOTE — ED Notes (Signed)
RN unable to obtain labs when starting IV

## 2014-04-02 NOTE — ED Provider Notes (Addendum)
CSN: [id number]     Arrival date & time 04/02/14  1546 History   First MD Initiated Contact with Patient 04/02/14 1555     Chief Complaint  Patient presents with  . Fall  . Weakness     (Consider location/radiation/quality/duration/timing/severity/associated sxs/prior Treatment) Patient is a 79 y.o. female presenting with fall and weakness.  Fall  Weakness   Level V caveat dementia. Patient reports that she fell in her home possibly earlier today. She reports having lay on the ground for possibly one hour until her son came home and found her. She complains of headache diffuse bilateral knee pain bilateral elbow pain. She states "I walk would not hold me up" she has felt generally weak for the past 2-3 days. Had headache the pain and bilateral elbow pain before the fall today is. Joint pain is worse with movement. No treatment prior to coming here. Brought by EMS. Other associated symptoms include cough for the past 2-3 days. She reports is nonproductive Past Medical History  Diagnosis Date  . Coronary artery disease     stents x 2, sees Dr. Halina Andreas practice  . Congestive heart failure     2012  . Myocardial infarction     "unsure of when"  . Hypertension   . Diabetes mellitus   . Shortness of breath   . Anemia   . Blood transfusion     when had hysterectomy  . Hypothyroidism     has had hx of treatment  . Urinary frequency   . Urinary tract infection     hx of  . GERD (gastroesophageal reflux disease)   . H/O hiatal hernia   . Arthritis   . Depression   . Spinal stenosis     with bone spurs and ruptured disc  . Cataract   . Blood transfusion without reported diagnosis   . Complications affecting other specified body systems, hypertension   . Other malaise and fatigue   . Dysuria   . Unspecified vitamin D deficiency   . Anxiety state, unspecified   . Other B-complex deficiencies   . Abnormality of gait   . Other and unspecified hyperlipidemia    Past  Surgical History  Procedure Laterality Date  . Cataract extraction w/ intraocular lens  implant, bilateral    . Bladder suspension    . Coronary      stents x 2, stent implant placed9/21/06  . Back surgery      ruptured disc, lumbar area  . Appendectomy    . Cardiac catheterization    . Lumbar laminectomy/decompression microdiscectomy  12/21/2011    Procedure: LUMBAR LAMINECTOMY/DECOMPRESSION MICRODISCECTOMY 1 LEVEL;  Surgeon: Temple Pacini, MD;  Location: [facility];  Service: Neurosurgery;  Laterality: Bilateral;  Lumbar four-five decompressive lumbar laminectomy; Right Lumbar four-five microdiscectomy  . Eye surgery    . Spine surgery    . Abdominal hysterectomy  1986   Family History  Problem Relation Age of Onset  . Anesthesia problems Sister   . Anesthesia problems Son   . Heart disease Mother    History  Substance Use Topics  . Smoking status: Former Smoker    Types: Cigarettes  . Smokeless tobacco: Current User    Types: Snuff     Comment: 40 years ago  . Alcohol Use: No   OB History   Grav Para Term Preterm Abortions TAB SAB Ect Mult Living  Review of Systems  Unable to perform ROS: Dementia  Respiratory: Positive for cough.   Musculoskeletal: Positive for arthralgias and gait problem.       Walks with walker. Limited extension of both elbows, chronic  Neurological: Positive for weakness.      Allergies  Morphine and related and Enalapril  Home Medications   Prior to Admission medications   Medication Sig Start Date End Date Taking? Authorizing Provider  atorvastatin (LIPITOR) 10 MG tablet Take 1 tablet (10 mg total) by mouth daily at 6 PM. 02/24/14   Kela MillinAdeline C Viyuoh, MD  Calcium Carbonate-Vitamin D (CALCIUM + D PO) Take 1 tablet by mouth daily. Calcium 315 mg,Vitamin D3 250 I.U.    Historical Provider, MD  cefpodoxime (VANTIN) 200 MG tablet Take 1 tablet (200 mg total) by mouth every 12 (twelve) hours. 02/24/14   Kela MillinAdeline C Viyuoh, MD   clonazePAM (KLONOPIN) 0.5 MG tablet Take 0.5 mg by mouth 3 (three) times daily.    Historical Provider, MD  clopidogrel (PLAVIX) 75 MG tablet Take 1 tablet (75 mg total) by mouth daily. 02/24/14   Kela MillinAdeline C Viyuoh, MD  fenofibrate 54 MG tablet Take 1 tablet (54 mg total) by mouth daily. 02/24/14   Kela MillinAdeline C Viyuoh, MD  ferrous sulfate 325 (65 FE) MG tablet Take 325 mg by mouth daily with breakfast.    Historical Provider, MD  fesoterodine (TOVIAZ) 4 MG TB24 tablet Take 4 mg by mouth daily.    Historical Provider, MD  fish oil-omega-3 fatty acids 1000 MG capsule Take 1 g by mouth daily.     Historical Provider, MD  hydrALAZINE (APRESOLINE) 25 MG tablet Take 25 mg by mouth 3 (three) times daily.    Historical Provider, MD  losartan-hydrochlorothiazide (HYZAAR) 50-12.5 MG per tablet Take 1 tablet by mouth daily. 02/17/13   Oneal GroutMahima Pandey, MD  metFORMIN (GLUCOPHAGE) 500 MG tablet Take 1 tablet (500 mg total) by mouth daily with breakfast. 11/04/13   Oneal GroutMahima Pandey, MD  metoprolol (LOPRESSOR) 100 MG tablet Take 1 tablet (100 mg total) by mouth 2 (two) times daily. 12/14/13   Tiffany L Reed, DO  nitroGLYCERIN (NITROSTAT) 0.4 MG SL tablet Place 0.4 mg under the tongue every 5 (five) minutes as needed for chest pain. Max 3 tablets in 15 minutes    Historical Provider, MD  oxyCODONE-acetaminophen (PERCOCET/ROXICET) 5-325 MG per tablet Take 1-2 tablets by mouth every 6 (six) hours as needed for severe pain (pain).    Historical Provider, MD  polyethylene glycol (MIRALAX / GLYCOLAX) packet Take 17 g by mouth daily as needed (pain).    Historical Provider, MD  polyethylene glycol powder (GLYCOLAX/MIRALAX) powder Take 17 g by mouth daily as needed. 06/23/13   Oneal GroutMahima Pandey, MD  QUEtiapine (SEROQUEL) 25 MG tablet Take 1 tablet (25 mg total) by mouth 2 (two) times daily. Take 1 tablet twice daily 11/25/13   Oneal GroutMahima Pandey, MD  senna-docusate (SENNA S) 8.6-50 MG per tablet Take 1 tablet by mouth 2 (two) times daily.     Historical Provider, MD   BP 136/65  Pulse 65  Temp(Src) 98.3 F (36.8 C) (Oral)  Resp 15  SpO2 98% Physical Exam  Nursing note and vitals reviewed. Constitutional:  Chronically ill-appearing  HENT:  Head: Normocephalic and atraumatic.  Eyes: Conjunctivae are normal. Pupils are equal, round, and reactive to light.  Neck: Neck supple. No tracheal deviation present. No thyromegaly present.  Cardiovascular: Normal rate and regular rhythm.   No murmur heard. Pulmonary/Chest: Effort  normal and breath sounds normal.  Abdominal: Soft. Bowel sounds are normal. She exhibits no distension. There is no tenderness.  Obese  Musculoskeletal: Normal range of motion. She exhibits no edema and no tenderness.  All 4 extremities with No point tenderness. No soft tissue swelling neurovascular intact Limited extension of both elbows   Neurological: She is alert. Coordination normal.  Skin: Skin is warm and dry. No rash noted.  Psychiatric: She has a normal mood and affect.    ED Course  Procedures (including critical care time) Labs Review Labs Reviewed  COMPREHENSIVE METABOLIC PANEL  CBC WITH DIFFERENTIAL  TROPONIN I  URINALYSIS, ROUTINE W REFLEX MICROSCOPIC  CK    Imaging Review No results found.   EKG Interpretation   Date/Time:  Friday April 02 2014 16:01:43 EDT Ventricular Rate:  66 PR Interval:  241 QRS Duration: 90 QT Interval:  442 QTC Calculation: 463 R Axis:   -77 Text Interpretation:  Sinus rhythm Prolonged PR interval Inferior infarct,  old Anterior infarct, old No significant change since last tracing  Confirmed by Roe Koffman  MD, Charlie Seda [phone]) on 04/02/2014 4:26:50 PM     7:50 PM patient was able walk a walker without difficulty. She no longer feels weak. Results for orders placed during the hospital encounter of 04/02/14  COMPREHENSIVE METABOLIC PANEL      Result Value Ref Range   Sodium 144  137 - 147 mEq/L   Potassium 4.0  3.7 - 5.3 mEq/L   Chloride 103  96 -  112 mEq/L   CO2 27  19 - 32 mEq/L   Glucose, Bld 125 (*) 70 - 99 mg/dL   BUN 28 (*) 6 - 23 mg/dL   Creatinine, Ser 6.04 (*) 0.50 - 1.10 mg/dL   Calcium 9.8  8.4 - 54.0 mg/dL   Total Protein 6.5  6.0 - 8.3 g/dL   Albumin 3.5  3.5 - 5.2 g/dL   AST 14  0 - 37 U/L   ALT 18  0 - 35 U/L   Alkaline Phosphatase 59  39 - 117 U/L   Total Bilirubin 0.4  0.3 - 1.2 mg/dL   GFR calc non Af Amer 44 (*) >90 mL/min   GFR calc Af Amer 51 (*) >90 mL/min   Anion gap 14  5 - 15  CBC WITH DIFFERENTIAL      Result Value Ref Range   WBC 8.7  4.0 - 10.5 K/uL   RBC 4.27  3.87 - 5.11 MIL/uL   Hemoglobin 13.1  12.0 - 15.0 g/dL   HCT 98.1  19.1 - 47.8 %   MCV 92.5  78.0 - 100.0 fL   MCH 30.7  26.0 - 34.0 pg   MCHC 33.2  30.0 - 36.0 g/dL   RDW 29.5  62.1 - 30.8 %   Platelets 201  150 - 400 K/uL   Neutrophils Relative % 67  43 - 77 %   Neutro Abs 5.8  1.7 - 7.7 K/uL   Lymphocytes Relative 20  12 - 46 %   Lymphs Abs 1.7  0.7 - 4.0 K/uL   Monocytes Relative 6  3 - 12 %   Monocytes Absolute 0.5  0.1 - 1.0 K/uL   Eosinophils Relative 8 (*) 0 - 5 %   Eosinophils Absolute 0.7  0.0 - 0.7 K/uL   Basophils Relative 1  0 - 1 %   Basophils Absolute 0.1  0.0 - 0.1 K/uL  TROPONIN I  Result Value Ref Range   Troponin I <0.30  <0.30 ng/mL  URINALYSIS, ROUTINE W REFLEX MICROSCOPIC      Result Value Ref Range   Color, Urine YELLOW  YELLOW   APPearance CLOUDY (*) CLEAR   Specific Gravity, Urine 1.020  1.005 - 1.030   pH 5.0  5.0 - 8.0   Glucose, UA NEGATIVE  NEGATIVE mg/dL   Hgb urine dipstick NEGATIVE  NEGATIVE   Bilirubin Urine NEGATIVE  NEGATIVE   Ketones, ur NEGATIVE  NEGATIVE mg/dL   Protein, ur NEGATIVE  NEGATIVE mg/dL   Urobilinogen, UA 0.2  0.0 - 1.0 mg/dL   Nitrite NEGATIVE  NEGATIVE   Leukocytes, UA SMALL (*) NEGATIVE  CK      Result Value Ref Range   Total CK 49  7 - 177 U/L  URINE MICROSCOPIC-ADD ON      Result Value Ref Range   Squamous Epithelial / LPF FEW (*) RARE   WBC, UA 7-10  <3  WBC/hpf   Bacteria, UA RARE  RARE   Casts GRANULAR CAST (*) NEGATIVE   Dg Chest 2 View  04/02/2014   CLINICAL DATA:  Status post fall.  Cough.  EXAM: CHEST  2 VIEW  COMPARISON:  PA and lateral chest 02/22/2014.  FINDINGS: There is cardiomegaly without edema. Lungs are clear. No pneumothorax or pleural effusion. No focal bony abnormality. Spondylosis noted.  IMPRESSION: Cardiomegaly without acute disease.   Electronically Signed   By: Drusilla Kanner M.D.   On: 04/02/2014 17:40   Ct Head Wo Contrast  04/02/2014   CLINICAL DATA:  Trauma, blunt trauma left-sided head  EXAM: CT HEAD WITHOUT CONTRAST  TECHNIQUE: Contiguous axial images were obtained from the base of the skull through the vertex without intravenous contrast.  COMPARISON:  Head CT 02/21/2014  FINDINGS: No intracranial hemorrhage. No parenchymal contusion. No midline shift or mass effect. Basilar cisterns are patent. No skull base fracture. No fluid in the paranasal sinuses . Orbits are normal.  There is mild periventricular subcortical white matter hypodensities unchanged prior.  Small amount of fluid in the left mastoid air cells, unchanged from prior  IMPRESSION: 1. No intracranial trauma. 2. No change from prior   Electronically Signed   By: Genevive Bi M.D.   On: 04/02/2014 17:22    MDM  Case manager consulted.. Home health nurse will visit the patient tomorrow. Final diagnoses:  None  weakness likely multifactorial due to medications, poor conditining She feels ready to go home and she will be staying with her son tonight Diagnosis #1 fall #2 weakness    Doug Sou, MD 04/02/14 2015  Doug Sou, MD 04/02/14 2016

## 2014-04-02 NOTE — ED Notes (Signed)
EDP at bedside,  

## 2014-04-02 NOTE — Progress Notes (Signed)
ED CM consulted by Dr Cathleen Fears concerning patient with recurrent falls s/p CVA recently discharged from [facility]. Patient presented to [facility] ED with weakness and s/p today. Met with patient and son Zorianna Taliaferro. Son reports that Pt lives at home alone and family checks on her a couple times per day. She is active with [facility] PT which was set up from the facility and scheduled to start today. Patient and son unsure of the [facility] agency. ED CM called around to [facility] agencies and left message for RTC regarding possibility of [place] services. Son states, he was concerned so he brought her to [facility] ED. ED evaluation in progress.  ED CM discussed the possibility of increasing [place] services, with patient and son they are agreeable. Referral will be placed in the am for the increase of hh services, once hh agency is confirmed. Verified address and phone number. Confirmed patient has rolling walker at home, but states she does not have a 3:1 chair. Offered choice for DME agency. [facility] selected, ED CM explained that tomorrow a [facility] rep is in-house offered family the option of picking the 3:1 chair up here. Son is agreeable.   ED evaluation unremarkable. Discussed disposition plan with Dr. Milagros Evener he is agreeable. ED CM will follow up with patient and family in the am.

## 2014-04-02 NOTE — ED Notes (Signed)
Pt continues to be monitored by 5 lead, blood pressure and pulse ox. Pts family remains at bedside.  

## 2014-04-03 DIAGNOSIS — I2510 Atherosclerotic heart disease of native coronary artery without angina pectoris: Secondary | ICD-10-CM | POA: Diagnosis not present

## 2014-04-03 DIAGNOSIS — I69959 Hemiplegia and hemiparesis following unspecified cerebrovascular disease affecting unspecified side: Secondary | ICD-10-CM | POA: Diagnosis not present

## 2014-04-03 DIAGNOSIS — I509 Heart failure, unspecified: Secondary | ICD-10-CM | POA: Diagnosis not present

## 2014-04-03 DIAGNOSIS — F0391 Unspecified dementia with behavioral disturbance: Secondary | ICD-10-CM | POA: Diagnosis not present

## 2014-04-03 DIAGNOSIS — F03918 Unspecified dementia, unspecified severity, with other behavioral disturbance: Secondary | ICD-10-CM | POA: Diagnosis not present

## 2014-04-03 DIAGNOSIS — M1990 Unspecified osteoarthritis, unspecified site: Secondary | ICD-10-CM | POA: Diagnosis not present

## 2014-04-03 DIAGNOSIS — E119 Type 2 diabetes mellitus without complications: Secondary | ICD-10-CM | POA: Diagnosis not present

## 2014-04-05 DIAGNOSIS — I2510 Atherosclerotic heart disease of native coronary artery without angina pectoris: Secondary | ICD-10-CM | POA: Diagnosis not present

## 2014-04-05 DIAGNOSIS — E119 Type 2 diabetes mellitus without complications: Secondary | ICD-10-CM | POA: Diagnosis not present

## 2014-04-05 DIAGNOSIS — F0391 Unspecified dementia with behavioral disturbance: Secondary | ICD-10-CM | POA: Diagnosis not present

## 2014-04-05 DIAGNOSIS — F03918 Unspecified dementia, unspecified severity, with other behavioral disturbance: Secondary | ICD-10-CM | POA: Diagnosis not present

## 2014-04-05 DIAGNOSIS — I69959 Hemiplegia and hemiparesis following unspecified cerebrovascular disease affecting unspecified side: Secondary | ICD-10-CM | POA: Diagnosis not present

## 2014-04-05 DIAGNOSIS — I509 Heart failure, unspecified: Secondary | ICD-10-CM | POA: Diagnosis not present

## 2014-04-05 DIAGNOSIS — M1990 Unspecified osteoarthritis, unspecified site: Secondary | ICD-10-CM | POA: Diagnosis not present

## 2014-04-06 DIAGNOSIS — E119 Type 2 diabetes mellitus without complications: Secondary | ICD-10-CM | POA: Diagnosis not present

## 2014-04-06 DIAGNOSIS — F0391 Unspecified dementia with behavioral disturbance: Secondary | ICD-10-CM | POA: Diagnosis not present

## 2014-04-06 DIAGNOSIS — M1990 Unspecified osteoarthritis, unspecified site: Secondary | ICD-10-CM | POA: Diagnosis not present

## 2014-04-06 DIAGNOSIS — I2510 Atherosclerotic heart disease of native coronary artery without angina pectoris: Secondary | ICD-10-CM | POA: Diagnosis not present

## 2014-04-06 DIAGNOSIS — I69959 Hemiplegia and hemiparesis following unspecified cerebrovascular disease affecting unspecified side: Secondary | ICD-10-CM | POA: Diagnosis not present

## 2014-04-06 DIAGNOSIS — I509 Heart failure, unspecified: Secondary | ICD-10-CM | POA: Diagnosis not present

## 2014-04-06 DIAGNOSIS — F03918 Unspecified dementia, unspecified severity, with other behavioral disturbance: Secondary | ICD-10-CM | POA: Diagnosis not present

## 2014-04-07 DIAGNOSIS — F03918 Unspecified dementia, unspecified severity, with other behavioral disturbance: Secondary | ICD-10-CM | POA: Diagnosis not present

## 2014-04-07 DIAGNOSIS — F0391 Unspecified dementia with behavioral disturbance: Secondary | ICD-10-CM | POA: Diagnosis not present

## 2014-04-07 DIAGNOSIS — I2510 Atherosclerotic heart disease of native coronary artery without angina pectoris: Secondary | ICD-10-CM | POA: Diagnosis not present

## 2014-04-07 DIAGNOSIS — I69959 Hemiplegia and hemiparesis following unspecified cerebrovascular disease affecting unspecified side: Secondary | ICD-10-CM | POA: Diagnosis not present

## 2014-04-07 DIAGNOSIS — E119 Type 2 diabetes mellitus without complications: Secondary | ICD-10-CM | POA: Diagnosis not present

## 2014-04-07 DIAGNOSIS — I509 Heart failure, unspecified: Secondary | ICD-10-CM | POA: Diagnosis not present

## 2014-04-07 DIAGNOSIS — M1990 Unspecified osteoarthritis, unspecified site: Secondary | ICD-10-CM | POA: Diagnosis not present

## 2014-04-08 DIAGNOSIS — E119 Type 2 diabetes mellitus without complications: Secondary | ICD-10-CM | POA: Diagnosis not present

## 2014-04-08 DIAGNOSIS — F0391 Unspecified dementia with behavioral disturbance: Secondary | ICD-10-CM | POA: Diagnosis not present

## 2014-04-08 DIAGNOSIS — I509 Heart failure, unspecified: Secondary | ICD-10-CM | POA: Diagnosis not present

## 2014-04-08 DIAGNOSIS — M1990 Unspecified osteoarthritis, unspecified site: Secondary | ICD-10-CM | POA: Diagnosis not present

## 2014-04-08 DIAGNOSIS — I2510 Atherosclerotic heart disease of native coronary artery without angina pectoris: Secondary | ICD-10-CM | POA: Diagnosis not present

## 2014-04-08 DIAGNOSIS — F03918 Unspecified dementia, unspecified severity, with other behavioral disturbance: Secondary | ICD-10-CM | POA: Diagnosis not present

## 2014-04-08 DIAGNOSIS — I69959 Hemiplegia and hemiparesis following unspecified cerebrovascular disease affecting unspecified side: Secondary | ICD-10-CM | POA: Diagnosis not present

## 2014-04-13 ENCOUNTER — Encounter: Payer: Self-pay | Admitting: Internal Medicine

## 2014-04-13 ENCOUNTER — Ambulatory Visit: Payer: Medicare Other | Admitting: Internal Medicine

## 2014-04-13 ENCOUNTER — Ambulatory Visit (INDEPENDENT_AMBULATORY_CARE_PROVIDER_SITE_OTHER): Payer: Medicare Other | Admitting: Internal Medicine

## 2014-04-13 VITALS — BP 146/72 | HR 70 | Temp 97.9°F | Wt 203.8 lb

## 2014-04-13 DIAGNOSIS — F0391 Unspecified dementia with behavioral disturbance: Secondary | ICD-10-CM

## 2014-04-13 DIAGNOSIS — I2510 Atherosclerotic heart disease of native coronary artery without angina pectoris: Secondary | ICD-10-CM | POA: Diagnosis not present

## 2014-04-13 DIAGNOSIS — F03918 Unspecified dementia, unspecified severity, with other behavioral disturbance: Secondary | ICD-10-CM | POA: Diagnosis not present

## 2014-04-13 DIAGNOSIS — F05 Delirium due to known physiological condition: Secondary | ICD-10-CM | POA: Diagnosis not present

## 2014-04-13 DIAGNOSIS — F3289 Other specified depressive episodes: Secondary | ICD-10-CM

## 2014-04-13 DIAGNOSIS — I69959 Hemiplegia and hemiparesis following unspecified cerebrovascular disease affecting unspecified side: Secondary | ICD-10-CM | POA: Diagnosis not present

## 2014-04-13 DIAGNOSIS — I6350 Cerebral infarction due to unspecified occlusion or stenosis of unspecified cerebral artery: Secondary | ICD-10-CM | POA: Diagnosis not present

## 2014-04-13 DIAGNOSIS — E119 Type 2 diabetes mellitus without complications: Secondary | ICD-10-CM | POA: Diagnosis not present

## 2014-04-13 DIAGNOSIS — I639 Cerebral infarction, unspecified: Secondary | ICD-10-CM

## 2014-04-13 DIAGNOSIS — F32A Depression, unspecified: Secondary | ICD-10-CM

## 2014-04-13 DIAGNOSIS — I509 Heart failure, unspecified: Secondary | ICD-10-CM | POA: Diagnosis not present

## 2014-04-13 DIAGNOSIS — M1990 Unspecified osteoarthritis, unspecified site: Secondary | ICD-10-CM | POA: Diagnosis not present

## 2014-04-13 DIAGNOSIS — F329 Major depressive disorder, single episode, unspecified: Secondary | ICD-10-CM

## 2014-04-13 DIAGNOSIS — R300 Dysuria: Secondary | ICD-10-CM | POA: Diagnosis not present

## 2014-04-13 DIAGNOSIS — I10 Essential (primary) hypertension: Secondary | ICD-10-CM | POA: Diagnosis not present

## 2014-04-13 DIAGNOSIS — I5032 Chronic diastolic (congestive) heart failure: Secondary | ICD-10-CM | POA: Diagnosis not present

## 2014-04-13 LAB — POCT URINALYSIS DIPSTICK
Bilirubin, UA: NEGATIVE
Glucose, UA: NEGATIVE
Ketones, UA: NEGATIVE
NITRITE UA: NEGATIVE
PH UA: 7
Protein, UA: NEGATIVE
Spec Grav, UA: 1.015
UROBILINOGEN UA: 0.2

## 2014-04-13 MED ORDER — CLONAZEPAM 0.5 MG PO TABS: 0.5000 mg | ORAL_TABLET | Freq: Two times a day (BID) | ORAL | Status: DC | PRN

## 2014-04-13 MED ORDER — NITROFURANTOIN MONOHYD MACRO 100 MG PO CAPS: 100.0000 mg | ORAL_CAPSULE | Freq: Two times a day (BID) | ORAL | Status: DC

## 2014-04-13 MED ORDER — CLONAZEPAM 0.5 MG PO TABS: 0.5000 mg | ORAL_TABLET | Freq: Two times a day (BID) | ORAL | Status: DC

## 2014-04-13 MED ORDER — TRAZODONE 25 MG HALF TABLET: 25.0000 mg | ORAL_TABLET | ORAL | Status: DC

## 2014-04-13 MED ORDER — PHENAZOPYRIDINE HCL 100 MG PO TABS: 100.0000 mg | ORAL_TABLET | Freq: Three times a day (TID) | ORAL | Status: DC | PRN

## 2014-04-13 MED ORDER — QUETIAPINE FUMARATE 25 MG PO TABS: ORAL_TABLET | ORAL | Status: DC

## 2014-04-13 NOTE — Progress Notes (Signed)
Patient ID: Abigail Brooks, female   DOB: [birth year], 79 y.o.   MRN: [id number]    Chief Complaint  Patient presents with  . Hospitalization Follow-up    hospital & Rehab f/u, discuss Genetic Test Results and ?form  . other    pt had 4 fall in the last 2 weeks.  also needs refill on Trazodone but not sure of the mg dose, only take 1/2 tablet @ night sleep.   Allergies  Allergen Reactions  . Morphine And Related Other (See Comments)    Makes her crazy  . Enalapril Swelling    Facial swelling   HPI 79 y/o female patient is here for follow up after hospital admission from 02/21/14-02/24/14 with slurred speech and left arm weakness. MRI head was negative for acute stroke. Neurology was consulted and she was switched from aspirin to plavix and lipitor was started. Echocardiogram showed no embolic source and carotid doppler showed 39% narrowing. She was also treated for e.coli uti. She was then sent to SNF for STR to [facility]. She has history of HTN, Dementia, CAD, CHF, DM. She has had 4 falls in last 2 weeks. Her son is here with her and is concerned about another uti She is having trouble falling asleep and needs refill on her trazodone On review of meds, she is on klonopin tid, trazodone and seroquel She feels weak in her legs and has been working with home health therapy team  Review of Systems  Constitutional: Negative for fever, chills, weight loss, diaphoresis.  HENT: Negative for congestion, hearing loss and sore throat.   Eyes: Negative for blurred vision Respiratory: Negative for cough, sputum production, shortness of breath and wheezing.   Cardiovascular: Negative for chest pain, palpitations, orthopnea. Has leg swelling.  Gastrointestinal: Negative for heartburn, nausea, vomiting, abdominal pain Genitourinary: positive for dysuria, urgency, frequency. No flank pain.  Musculoskeletal: has back pain. On wheelchair  Skin: Negative for itching and rash.  Neurological: Positive for  weakness. Negative for dizziness, tingling, focal weakness and headaches.  Psychiatric/Behavioral: positive for depression and memory loss. The patient is not nervous/anxious.    Past Medical History  Diagnosis Date  . Coronary artery disease     stents x 2, sees Dr. Halina Andreas practice  . Congestive heart failure     2012  . Myocardial infarction     "unsure of when"  . Hypertension   . Diabetes mellitus   . Shortness of breath   . Anemia   . Blood transfusion     when had hysterectomy  . Hypothyroidism     has had hx of treatment  . Urinary frequency   . Urinary tract infection     hx of  . GERD (gastroesophageal reflux disease)   . H/O hiatal hernia   . Arthritis   . Depression   . Spinal stenosis     with bone spurs and ruptured disc  . Cataract   . Blood transfusion without reported diagnosis   . Complications affecting other specified body systems, hypertension   . Other malaise and fatigue   . Dysuria   . Unspecified vitamin D deficiency   . Anxiety state, unspecified   . Other B-complex deficiencies   . Abnormality of gait   . Other and unspecified hyperlipidemia    Current Outpatient Prescriptions on File Prior to Visit  Medication Sig Dispense Refill  . atorvastatin (LIPITOR) 10 MG tablet Take 1 tablet (10 mg total) by mouth daily at 6 PM.      .  Calcium Carbonate-Vitamin D (CALCIUM + D PO) Take 1 tablet by mouth daily. Calcium 315 mg,Vitamin D3 250 I.U.      . clopidogrel (PLAVIX) 75 MG tablet Take 1 tablet (75 mg total) by mouth daily.      . diclofenac sodium (VOLTAREN) 1 % GEL Apply 4 g topically 2 (two) times daily as needed (for knee pain).      . fenofibrate 54 MG tablet Take 1 tablet (54 mg total) by mouth daily.      . ferrous sulfate 325 (65 FE) MG tablet Take 325 mg by mouth daily with breakfast.      . fesoterodine (TOVIAZ) 4 MG TB24 tablet Take 4 mg by mouth daily.      . fish oil-omega-3 fatty acids 1000 MG capsule Take 1 g by mouth daily.        . hydrALAZINE (APRESOLINE) 25 MG tablet Take 25 mg by mouth 3 (three) times daily.      Marland Kitchen losartan-hydrochlorothiazide (HYZAAR) 50-12.5 MG per tablet Take 1 tablet by mouth daily.  90 tablet  3  . metoprolol (LOPRESSOR) 100 MG tablet Take 1 tablet (100 mg total) by mouth 2 (two) times daily.  60 tablet  5  . nitroGLYCERIN (NITROSTAT) 0.4 MG SL tablet Place 0.4 mg under the tongue every 5 (five) minutes as needed for chest pain. Max 3 tablets in 15 minutes      . oxyCODONE-acetaminophen (PERCOCET/ROXICET) 5-325 MG per tablet Take 1-2 tablets by mouth every 6 (six) hours as needed for severe pain (pain).      . polyethylene glycol (MIRALAX / GLYCOLAX) packet Take 17 g by mouth daily as needed (pain).      Marland Kitchen senna-docusate (SENNA S) 8.6-50 MG per tablet Take 1 tablet by mouth 2 (two) times daily.      . metFORMIN (GLUCOPHAGE) 500 MG tablet Take 1 tablet (500 mg total) by mouth daily with breakfast.  90 tablet  3  . [DISCONTINUED] oxybutynin (DITROPAN-XL) 5 MG 24 hr tablet Take one tablet by mouth once daily for bladder.       No current facility-administered medications on file prior to visit.   Past Surgical History  Procedure Laterality Date  . Cataract extraction w/ intraocular lens  implant, bilateral    . Bladder suspension    . Coronary      stents x 2, stent implant placed9/21/06  . Back surgery      ruptured disc, lumbar area  . Appendectomy    . Cardiac catheterization    . Lumbar laminectomy/decompression microdiscectomy  12/21/2011    Procedure: LUMBAR LAMINECTOMY/DECOMPRESSION MICRODISCECTOMY 1 LEVEL;  Surgeon: Temple Pacini, MD;  Location: [facility];  Service: Neurosurgery;  Laterality: Bilateral;  Lumbar four-five decompressive lumbar laminectomy; Right Lumbar four-five microdiscectomy  . Eye surgery    . Spine surgery    . Abdominal hysterectomy  1986   Physical exam BP 146/72  Pulse 70  Temp(Src) 97.9 F (36.6 C) (Oral)  Wt 203 lb 12.8 oz (92.443 kg)  SpO2  93%  General- elderly female in no acute distress, obese Head- atraumatic, normocephalic Neck- no lymphadenopathy Mouth- normal mucus membrane Cardiovascular- normal s1,s2 Respiratory- bilateral clear to auscultation, no wheeze, no rhonchi, no crackles Abdomen- bowel sounds present, soft, non tender Musculoskeletal- able to move all 4 extremities, 1+ leg edema, on wheelchair and using walker, generalized weakness Neurological- alert, no focal deficit Skin- warm and dry Psychiatry- has dementia  Labs- Lab Results  Component Value  Date   WBC 8.7 04/02/2014   HGB 13.1 04/02/2014   HCT 39.5 04/02/2014   MCV 92.5 04/02/2014   PLT 201 04/02/2014   CMP     Component Value Date/Time   NA 144 04/02/2014 1608   NA 144 10/29/2013 0810   K 4.0 04/02/2014 1608   CL 103 04/02/2014 1608   CO2 27 04/02/2014 1608   GLUCOSE 125* 04/02/2014 1608   GLUCOSE 146* 10/29/2013 0810   BUN 28* 04/02/2014 1608   BUN 18 10/29/2013 0810   CREATININE 1.16* 04/02/2014 1608   CALCIUM 9.8 04/02/2014 1608   PROT 6.5 04/02/2014 1608   PROT 6.3 06/08/2013 0853   ALBUMIN 3.5 04/02/2014 1608   AST 14 04/02/2014 1608   ALT 18 04/02/2014 1608   ALKPHOS 59 04/02/2014 1608   BILITOT 0.4 04/02/2014 1608   GFRNONAA 44* 04/02/2014 1608   GFRAA 51* 04/02/2014 1608   Lab Results  Component Value Date   HGBA1C 6.4* 02/22/2014    03/09/14 left foot xray negative for acute changes  Assessment/plan  1. Acute confusional state On bzd which could contribute some. ? uti which could also cause confusion. Check cbc with diff to rule out infection. Dehydration is another possibility- check for lytes imbalance. Decrease klonopin to 0.5 mg bid for now. Her dementia could have progressed. Change seroquel to 25 mg 1 in am and 2 in pm- this should help with her delirium, mood and sleep. Wean her off trazodone as below - CBC with Differential - CMP  2. Chronic diastolic CHF (congestive heart failure) Stable, continue b blocker and hydralazine  with losartan  3. CVA (cerebral vascular accident) Continue plavix and statin, continue bp meds, monitor bp readings  4. Essential hypertension, benign Take her hyzaar and hydralazine with lopressor, continue statin  5. Type II or unspecified type diabetes mellitus without mention of complication, not stated as uncontrolled Continue her metformin  6. Dementia with behavioral disturbance On seroqel and trazodone, monitor clinically  7. Depression Continue her seroquel and trazodone for now but change trazodone to 25 mg every other day for 5 days and then stop it  8. Dysuria Send urine for culture study. Treat her empirically with nitrofurantoin 100 mg bid for now and f/u culture report. Pyridium prn as advised - POC Urinalysis Dipstick - Culture, Urine

## 2014-04-13 NOTE — Patient Instructions (Signed)
Check blood pressure once a day until I see you next visit. Bring readings to the office for review  Cut down on trazodone to half a tablet every other day for 5 days and then stop it  Take your antibiotic for possible UTI

## 2014-04-14 DIAGNOSIS — I509 Heart failure, unspecified: Secondary | ICD-10-CM | POA: Diagnosis not present

## 2014-04-14 DIAGNOSIS — E119 Type 2 diabetes mellitus without complications: Secondary | ICD-10-CM | POA: Diagnosis not present

## 2014-04-14 DIAGNOSIS — F0391 Unspecified dementia with behavioral disturbance: Secondary | ICD-10-CM | POA: Diagnosis not present

## 2014-04-14 DIAGNOSIS — I69959 Hemiplegia and hemiparesis following unspecified cerebrovascular disease affecting unspecified side: Secondary | ICD-10-CM | POA: Diagnosis not present

## 2014-04-14 DIAGNOSIS — F03918 Unspecified dementia, unspecified severity, with other behavioral disturbance: Secondary | ICD-10-CM | POA: Diagnosis not present

## 2014-04-14 DIAGNOSIS — M1990 Unspecified osteoarthritis, unspecified site: Secondary | ICD-10-CM | POA: Diagnosis not present

## 2014-04-14 DIAGNOSIS — I2510 Atherosclerotic heart disease of native coronary artery without angina pectoris: Secondary | ICD-10-CM | POA: Diagnosis not present

## 2014-04-14 LAB — COMPREHENSIVE METABOLIC PANEL
ALT: 10 IU/L (ref 0–32)
AST: 9 IU/L (ref 0–40)
Albumin/Globulin Ratio: 1.7 (ref 1.1–2.5)
Albumin: 3.8 g/dL (ref 3.5–4.8)
Alkaline Phosphatase: 55 IU/L (ref 39–117)
BILIRUBIN TOTAL: 0.3 mg/dL (ref 0.0–1.2)
BUN/Creatinine Ratio: 15 (ref 11–26)
BUN: 21 mg/dL (ref 8–27)
CHLORIDE: 103 mmol/L (ref 97–108)
CO2: 25 mmol/L (ref 18–29)
Calcium: 9.4 mg/dL (ref 8.7–10.3)
Creatinine, Ser: 1.39 mg/dL — ABNORMAL HIGH (ref 0.57–1.00)
GFR calc Af Amer: 42 mL/min/{1.73_m2} — ABNORMAL LOW (ref >59)
GFR, EST NON AFRICAN AMERICAN: 37 mL/min/{1.73_m2} — AB (ref >59)
Globulin, Total: 2.3 g/dL (ref 1.5–4.5)
Glucose: 162 mg/dL — ABNORMAL HIGH (ref 65–99)
POTASSIUM: 3.8 mmol/L (ref 3.5–5.2)
SODIUM: 144 mmol/L (ref 134–144)
Total Protein: 6.1 g/dL (ref 6.0–8.5)

## 2014-04-14 LAB — CBC WITH DIFFERENTIAL/PLATELET
BASOS ABS: 0 10*3/uL (ref 0.0–0.2)
BASOS: 1 %
EOS ABS: 0.4 10*3/uL (ref 0.0–0.4)
Eos: 6 %
HCT: 37.9 % (ref 34.0–46.6)
Hemoglobin: 12.6 g/dL (ref 11.1–15.9)
IMMATURE GRANS (ABS): 0 10*3/uL (ref 0.0–0.1)
Immature Granulocytes: 0 %
LYMPHS: 22 %
Lymphocytes Absolute: 1.4 10*3/uL (ref 0.7–3.1)
MCH: 30.4 pg (ref 26.6–33.0)
MCHC: 33.2 g/dL (ref 31.5–35.7)
MCV: 91 fL (ref 79–97)
MONOS ABS: 0.4 10*3/uL (ref 0.1–0.9)
Monocytes: 6 %
Neutrophils Absolute: 4 10*3/uL (ref 1.4–7.0)
Neutrophils Relative %: 65 %
RBC: 4.15 x10E6/uL (ref 3.77–5.28)
RDW: 13.5 % (ref 12.3–15.4)
WBC: 6.1 10*3/uL (ref 3.4–10.8)

## 2014-04-15 ENCOUNTER — Telehealth: Payer: Self-pay

## 2014-04-15 DIAGNOSIS — F03918 Unspecified dementia, unspecified severity, with other behavioral disturbance: Secondary | ICD-10-CM | POA: Diagnosis not present

## 2014-04-15 DIAGNOSIS — I509 Heart failure, unspecified: Secondary | ICD-10-CM | POA: Diagnosis not present

## 2014-04-15 DIAGNOSIS — F0391 Unspecified dementia with behavioral disturbance: Secondary | ICD-10-CM | POA: Diagnosis not present

## 2014-04-15 DIAGNOSIS — M1990 Unspecified osteoarthritis, unspecified site: Secondary | ICD-10-CM | POA: Diagnosis not present

## 2014-04-15 DIAGNOSIS — E119 Type 2 diabetes mellitus without complications: Secondary | ICD-10-CM | POA: Diagnosis not present

## 2014-04-15 DIAGNOSIS — I2510 Atherosclerotic heart disease of native coronary artery without angina pectoris: Secondary | ICD-10-CM | POA: Diagnosis not present

## 2014-04-15 DIAGNOSIS — I69959 Hemiplegia and hemiparesis following unspecified cerebrovascular disease affecting unspecified side: Secondary | ICD-10-CM | POA: Diagnosis not present

## 2014-04-15 LAB — URINE CULTURE

## 2014-04-15 MED ORDER — CEFTRIAXONE SODIUM 1 G IJ SOLR: INTRAMUSCULAR | Status: DC

## 2014-04-15 NOTE — Telephone Encounter (Signed)
Spoke with Onalee Hua (patient's son), discussed plan of care to treat Urinary Tract Infection: Patient to continue Nitrofurantion until we contact home health to come out and administer injection (per Dr.Pandey).  Onalee Hua states patient is already receiving services through [facility].    I contacted [facility], patient is active with their agency.  RX was sent to pharmacy on file per [facility] receptionist, [facility] will provide needles and syringes to administer injection.   Phone note printed and faxed to [facility] @ [phone]

## 2014-04-15 NOTE — Telephone Encounter (Signed)
Message copied by Maurice Small on Thu Apr 15, 2014  4:37 PM ------      Message from: Oneal Grout      Created: Thu Apr 15, 2014  4:27 PM       You have UTI growing bacteria sensitive only to certain antibiotic. Stop taking nitrofurantoin (the antibiotic prescribed in clinic). Will need ceftriaxone 1 g im once a day for a week. Will need to set up home health to provide this. Spoke with CMA ------

## 2014-04-15 NOTE — Telephone Encounter (Signed)
Spoke with patient son to give status update. Onalee Hua is aware to pick-up rx from pharmacy. Onalee Hua was instructed to call our office if he does not hear anything from [facility] by mid-day tomorrow.

## 2014-04-16 NOTE — Telephone Encounter (Signed)
I called [facility] and confirmed order was received (spoke with Cordelia Pen)

## 2014-04-17 ENCOUNTER — Other Ambulatory Visit: Payer: Self-pay | Admitting: Internal Medicine

## 2014-04-19 NOTE — Telephone Encounter (Signed)
Son, Onalee Hua, called and stated that the pharmacy needed a Rx for the Lidocaine to go with the antibiotic injection. Called pharmacy and spoke with Dorene Grebe and called this in. Notified son this was done.

## 2014-04-20 DIAGNOSIS — E119 Type 2 diabetes mellitus without complications: Secondary | ICD-10-CM | POA: Diagnosis not present

## 2014-04-20 DIAGNOSIS — F03918 Unspecified dementia, unspecified severity, with other behavioral disturbance: Secondary | ICD-10-CM | POA: Diagnosis not present

## 2014-04-20 DIAGNOSIS — M1990 Unspecified osteoarthritis, unspecified site: Secondary | ICD-10-CM | POA: Diagnosis not present

## 2014-04-20 DIAGNOSIS — F0391 Unspecified dementia with behavioral disturbance: Secondary | ICD-10-CM | POA: Diagnosis not present

## 2014-04-20 DIAGNOSIS — I2510 Atherosclerotic heart disease of native coronary artery without angina pectoris: Secondary | ICD-10-CM | POA: Diagnosis not present

## 2014-04-20 DIAGNOSIS — I509 Heart failure, unspecified: Secondary | ICD-10-CM | POA: Diagnosis not present

## 2014-04-20 DIAGNOSIS — I69959 Hemiplegia and hemiparesis following unspecified cerebrovascular disease affecting unspecified side: Secondary | ICD-10-CM | POA: Diagnosis not present

## 2014-04-21 ENCOUNTER — Other Ambulatory Visit: Payer: Self-pay | Admitting: *Deleted

## 2014-04-21 DIAGNOSIS — I509 Heart failure, unspecified: Secondary | ICD-10-CM | POA: Diagnosis not present

## 2014-04-21 DIAGNOSIS — M1990 Unspecified osteoarthritis, unspecified site: Secondary | ICD-10-CM | POA: Diagnosis not present

## 2014-04-21 DIAGNOSIS — I69959 Hemiplegia and hemiparesis following unspecified cerebrovascular disease affecting unspecified side: Secondary | ICD-10-CM | POA: Diagnosis not present

## 2014-04-21 DIAGNOSIS — F0391 Unspecified dementia with behavioral disturbance: Secondary | ICD-10-CM | POA: Diagnosis not present

## 2014-04-21 DIAGNOSIS — E119 Type 2 diabetes mellitus without complications: Secondary | ICD-10-CM | POA: Diagnosis not present

## 2014-04-21 DIAGNOSIS — F03918 Unspecified dementia, unspecified severity, with other behavioral disturbance: Secondary | ICD-10-CM | POA: Diagnosis not present

## 2014-04-21 DIAGNOSIS — I2510 Atherosclerotic heart disease of native coronary artery without angina pectoris: Secondary | ICD-10-CM | POA: Diagnosis not present

## 2014-04-21 MED ORDER — CLOPIDOGREL BISULFATE 75 MG PO TABS: 75.0000 mg | ORAL_TABLET | Freq: Every day | ORAL | Status: DC

## 2014-04-21 MED ORDER — ATORVASTATIN CALCIUM 10 MG PO TABS: 10.0000 mg | ORAL_TABLET | Freq: Every day | ORAL | Status: DC

## 2014-04-21 NOTE — Telephone Encounter (Signed)
Patient son, Onalee Hua, requested to be faxed to pharmacy

## 2014-04-22 ENCOUNTER — Encounter: Payer: Self-pay | Admitting: Neurology

## 2014-04-22 ENCOUNTER — Ambulatory Visit (INDEPENDENT_AMBULATORY_CARE_PROVIDER_SITE_OTHER): Payer: Medicare Other | Admitting: Neurology

## 2014-04-22 VITALS — BP 163/71 | HR 57 | Temp 98.3°F | Ht 60.0 in | Wt 203.0 lb

## 2014-04-22 DIAGNOSIS — Z9181 History of falling: Secondary | ICD-10-CM

## 2014-04-22 DIAGNOSIS — E119 Type 2 diabetes mellitus without complications: Secondary | ICD-10-CM | POA: Diagnosis not present

## 2014-04-22 DIAGNOSIS — I509 Heart failure, unspecified: Secondary | ICD-10-CM | POA: Diagnosis not present

## 2014-04-22 DIAGNOSIS — I2510 Atherosclerotic heart disease of native coronary artery without angina pectoris: Secondary | ICD-10-CM | POA: Diagnosis not present

## 2014-04-22 DIAGNOSIS — R413 Other amnesia: Secondary | ICD-10-CM

## 2014-04-22 DIAGNOSIS — F03918 Unspecified dementia, unspecified severity, with other behavioral disturbance: Secondary | ICD-10-CM | POA: Diagnosis not present

## 2014-04-22 DIAGNOSIS — I6350 Cerebral infarction due to unspecified occlusion or stenosis of unspecified cerebral artery: Secondary | ICD-10-CM

## 2014-04-22 DIAGNOSIS — F05 Delirium due to known physiological condition: Secondary | ICD-10-CM

## 2014-04-22 DIAGNOSIS — F0391 Unspecified dementia with behavioral disturbance: Secondary | ICD-10-CM | POA: Diagnosis not present

## 2014-04-22 DIAGNOSIS — M1990 Unspecified osteoarthritis, unspecified site: Secondary | ICD-10-CM | POA: Diagnosis not present

## 2014-04-22 DIAGNOSIS — I69959 Hemiplegia and hemiparesis following unspecified cerebrovascular disease affecting unspecified side: Secondary | ICD-10-CM | POA: Diagnosis not present

## 2014-04-22 DIAGNOSIS — R296 Repeated falls: Secondary | ICD-10-CM | POA: Insufficient documentation

## 2014-04-22 NOTE — Patient Instructions (Addendum)
I had a long discussion with the patient and her son regarding her episode of altered consciousness followed by transient left-sided weakness and discussed differential diagnosis, my examination findings, plan for evaluation, treatment and answered questions. I recommend she check EEG as well as fasting lipid profile. Continue Plavix for stroke prevention with strict control of diabetes with hemoglobin A1c goal below 7, lipids his LDL cholesterol goal below 70 mg percent and hypertension with blood pressure goal below 120/80. Check vitamin B12, TSH and RPR for treatable causes of memory loss. Patient was advised about fall and safety precautions and continue with home physical therapy and use of walker at all times.  Fall Prevention and Home Safety Falls cause injuries and can affect all age groups. It is possible to use preventive measures to significantly decrease the likelihood of falls. There are many simple measures which can make your home safer and prevent falls. OUTDOORS  Repair cracks and edges of walkways and driveways.  Remove high doorway thresholds.  Trim shrubbery on the main path into your home.  Have good outside lighting.  Clear walkways of tools, rocks, debris, and clutter.  Check that handrails are not broken and are securely fastened. Both sides of steps should have handrails.  Have leaves, snow, and ice cleared regularly.  Use sand or salt on walkways during winter months.  In the garage, clean up grease or oil spills. BATHROOM  Install night lights.  Install grab bars by the toilet and in the tub and shower.  Use non-skid mats or decals in the tub or shower.  Place a plastic non-slip stool in the shower to sit on, if needed.  Keep floors dry and clean up all water on the floor immediately.  Remove soap buildup in the tub or shower on a regular basis.  Secure bath mats with non-slip, double-sided rug tape.  Remove throw rugs and tripping hazards from the  floors. BEDROOMS  Install night lights.  Make sure a bedside light is easy to reach.  Do not use oversized bedding.  Keep a telephone by your bedside.  Have a firm chair with side arms to use for getting dressed.  Remove throw rugs and tripping hazards from the floor. KITCHEN  Keep handles on pots and pans turned toward the center of the stove. Use back burners when possible.  Clean up spills quickly and allow time for drying.  Avoid walking on wet floors.  Avoid hot utensils and knives.  Position shelves so they are not too high or low.  Place commonly used objects within easy reach.  If necessary, use a sturdy step stool with a grab bar when reaching.  Keep electrical cables out of the way.  Do not use floor polish or wax that makes floors slippery. If you must use wax, use non-skid floor wax.  Remove throw rugs and tripping hazards from the floor. STAIRWAYS  Never leave objects on stairs.  Place handrails on both sides of stairways and use them. Fix any loose handrails. Make sure handrails on both sides of the stairways are as long as the stairs.  Check carpeting to make sure it is firmly attached along stairs. Make repairs to worn or loose carpet promptly.  Avoid placing throw rugs at the top or bottom of stairways, or properly secure the rug with carpet tape to prevent slippage. Get rid of throw rugs, if possible.  Have an electrician put in a light switch at the top and bottom of the stairs. OTHER  FALL PREVENTION TIPS  Wear low-heel or rubber-soled shoes that are supportive and fit well. Wear closed toe shoes.  When using a stepladder, make sure it is fully opened and both spreaders are firmly locked. Do not climb a closed stepladder.  Add color or contrast paint or tape to grab bars and handrails in your home. Place contrasting color strips on first and last steps.  Learn and use mobility aids as needed. Install an electrical emergency response  system.  Turn on lights to avoid dark areas. Replace light bulbs that burn out immediately. Get light switches that glow.  Arrange furniture to create clear pathways. Keep furniture in the same place.  Firmly attach carpet with non-skid or double-sided tape.  Eliminate uneven floor surfaces.  Select a carpet pattern that does not visually hide the edge of steps.  Be aware of all pets. OTHER HOME SAFETY TIPS  Set the water temperature for 120 F (48.8 C).  Keep emergency numbers on or near the telephone.  Keep smoke detectors on every level of the home and near sleeping areas. Document Released: 07/27/2002 Document Revised: 02/05/2012 Document Reviewed: 10/26/2011 [facility] Patient Information 2015 [place], [place]. This information is not intended to replace advice given to you by your health care provider. Make sure you discuss any questions you have with your health care provider.

## 2014-04-22 NOTE — Progress Notes (Signed)
[facility] [address] [place]. [place] [address] [phone]       OFFICE CONSULT NOTE  [name] Date of Birth:  16-Mar-1935 Medical Record Number:  [id number]   Referring MD:  Oneal Grout  Reason for Referral:   TIA  HPI: 24 year Caucasian lady who had an episode of transient loss of consciousness and slurred speech and left-sided weakness on 02/21/14. Her son was talking to her on the phone and noticed that she was not responding speaking. He immediately drove to her house about 30 minutes later noticed that she was would respond to him but her speech was slurred and she had some left-sided weakness. She presented to [facility]. She was felt to have had a stroke however MRI scan of the brain revealed no acute infarct. She was also found to have Escherichia coli Unrinary tract infection and treated with antibiotics. She had a history of recurrent bladder infections with confusion and a sensation following these in the past. She also had history of multiple falls. She had history of severe lumbar spinal stenosis with neurogenic claudication and had undergone prior surgery twice. The rest of her stroke workup was unremarkable including transthoracic echo which showed ejection fraction of 55-60% without any cardiac source of embolism. Carotid Doppler showed no significant extracranial stenosis. Lipid profile is significant for elevated LDL cholesterol of 105 mg percent  and elevated triglycerides for which she was started on fenofibrate and Lipitor. Hemoglobin A1c was 6.4. Patient had previous been on aspirin which which was switched to Plavix. She was in consultation by Dr. Thad Ranger not by stroke team.. EEG was not done.Son is unable to tell me if   she has been tried on medications like Aricept or Namenda in the past. She however has had improvement in her mental status and is back to baseline. She has no  residual weakness. She was walking with a cane with  multiple falls and recently she has been switched to a walker. She is currently getting a home physical therapy were working with advocating her to use a walker correctly. She yet has had 2 falls this week already. She has no known prior history of strokes TIAs seizures. It is unclear to me whether she's had dementia workup in the past or treatment..Behavioural disturbance was documented in her chart and she was advised to stay on Seroquel.   ROS:   14 system review of systems is positive for leg swelling, snoring, constipation, urination problems, incontinence, easy bruising, joint pain and swelling, aching muscles, for frequent bladder and that she is, memory loss, confusion, headache, weakness, snoring, depression, anxiety, decreased energy. PMH:  Past Medical History  Diagnosis Date  . Coronary artery disease     stents x 2, sees Dr. Halina [facility] practice  . Congestive heart failure     2012  . Myocardial infarction     "unsure of when"  . Hypertension   . Diabetes mellitus   . Shortness of breath   . Anemia   . Blood transfusion     when had hysterectomy  . Hypothyroidism     has had hx of treatment  . Urinary frequency   . Urinary tract infection     hx of  . GERD (gastroesophageal reflux disease)   . H/O hiatal hernia   . Arthritis   . Depression   . Spinal stenosis     with bone spurs and ruptured disc  . Cataract   .  Blood transfusion without reported diagnosis   . Complications affecting other specified body systems, hypertension   . Other malaise and fatigue   . Dysuria   . Unspecified vitamin D deficiency   . Anxiety state, unspecified   . Other B-complex deficiencies   . Abnormality of gait   . Other and unspecified hyperlipidemia     Social History:  History   Social History  . Marital Status: Divorced    Spouse Name: N/A    Number of Children: 3  . Years of Education: 8th   Occupational History  . retired Other   Social History Main Topics  .  Smoking status: Former Smoker    Types: Cigarettes  . Smokeless tobacco: Current User    Types: Snuff     Comment: 40 years ago  . Alcohol Use: No  . Drug Use: No  . Sexual Activity: Not on file   Other Topics Concern  . Not on file   Social History Narrative   Patient lives at home alone.   Caffeine use: none    Medications:   Current Outpatient Prescriptions on File Prior to Visit  Medication Sig Dispense Refill  . atorvastatin (LIPITOR) 10 MG tablet Take 1 tablet (10 mg total) by mouth daily at 6 PM.  30 tablet  3  . Calcium Carbonate-Vitamin D (CALCIUM + D PO) Take 1 tablet by mouth daily. Calcium 315 mg,Vitamin D3 250 I.U.      . cefTRIAXone (ROCEPHIN) 1 G injection One injection daily x 7 days then discontinue  7 each  0  . clonazePAM (KLONOPIN) 0.5 MG tablet Take 1 tablet (0.5 mg total) by mouth 2 (two) times daily.  30 tablet  0  . clopidogrel (PLAVIX) 75 MG tablet Take 1 tablet (75 mg total) by mouth daily.  30 tablet  3  . diclofenac sodium (VOLTAREN) 1 % GEL Apply 4 g topically 2 (two) times daily as needed (for knee pain).      . fenofibrate 54 MG tablet Take 1 tablet (54 mg total) by mouth daily.      . ferrous sulfate 325 (65 FE) MG tablet Take 325 mg by mouth daily with breakfast.      . fesoterodine (TOVIAZ) 4 MG TB24 tablet Take 4 mg by mouth daily.      . fish oil-omega-3 fatty acids 1000 MG capsule Take 1 g by mouth daily.       . hydrALAZINE (APRESOLINE) 25 MG tablet Take 25 mg by mouth 3 (three) times daily.      Marland Kitchen losartan-hydrochlorothiazide (HYZAAR) 50-12.5 MG per tablet TAKE 1 TABLET BY MOUTH DAILY.  90 tablet  2  . metFORMIN (GLUCOPHAGE) 500 MG tablet Take 1 tablet (500 mg total) by mouth daily with breakfast.  90 tablet  3  . metoprolol (LOPRESSOR) 100 MG tablet Take 1 tablet (100 mg total) by mouth 2 (two) times daily.  60 tablet  5  . nitrofurantoin, macrocrystal-monohydrate, (MACROBID) 100 MG capsule Take 1 capsule (100 mg total) by mouth 2 (two) times  daily.  28 capsule  0  . nitroGLYCERIN (NITROSTAT) 0.4 MG SL tablet Place 0.4 mg under the tongue every 5 (five) minutes as needed for chest pain. Max 3 tablets in 15 minutes      . oxyCODONE-acetaminophen (PERCOCET/ROXICET) 5-325 MG per tablet Take 1-2 tablets by mouth every 6 (six) hours as needed for severe pain (pain).      . phenazopyridine (PYRIDIUM) 100 MG tablet Take 1  tablet (100 mg total) by mouth 3 (three) times daily as needed for pain.  10 tablet  0  . polyethylene glycol (MIRALAX / GLYCOLAX) packet Take 17 g by mouth daily as needed (pain).      . QUEtiapine (SEROQUEL) 25 MG tablet Take 1 tablet in the morning and 2 tablet in the evening  90 tablet  3  . senna-docusate (SENNA S) 8.6-50 MG per tablet Take 1 tablet by mouth 2 (two) times daily.      . traZODone (DESYREL) 25 mg TABS tablet Take 0.5 tablets (25 mg total) by mouth every other day. For 5 days and stop it  7 tablet  0  . [DISCONTINUED] oxybutynin (DITROPAN-XL) 5 MG 24 hr tablet Take one tablet by mouth once daily for bladder.       No current facility-administered medications on file prior to visit.    Allergies:   Allergies  Allergen Reactions  . Morphine And Related Other (See Comments)    Makes her crazy  . Enalapril Swelling    Facial swelling    Physical Exam General: obese elderly Caucasian lady, seated, in no evident distress Head: head normocephalic and atraumatic. Orohparynx benign Neck: supple with no carotid or supraclavicular bruits Cardiovascular: regular rate and rhythm, no murmurs Musculoskeletal: obese Skin:  no rash/petichiae. 1 plus pedal edema present bilaterally. Vascular:  Normal pulses all extremities Filed Vitals:   04/22/14 1019  BP: 163/71  Pulse: 57  Temp: 98.3 F (36.8 C)    Neurologic Exam Mental Status: Awake and fully alert. Disriented to place and time. Recent and remote memory diminished. MMSE 17/30 .AFT 9 . Geriatric Depression scale  7 s/o mild depression. Attention  span, concentration and fund of knowledge all poor.. Mood and affect appropriate.  Cranial Nerves: Fundoscopic exam reveals sharp disc margins. Pupils equal, briskly reactive to light. Extraocular movements full without nystagmus. Visual fields full to confrontation. Hearing intact. Facial sensation intact. Face, tongue, palate moves normally and symmetrically.  Motor: Normal bulk and tone. Normal strength in all tested extremity muscles. Sensory.: intact to touch and pinprick and diminished  Vibratory sensation ankle down.  Coordination: Rapid alternating movements normal in all extremities. Finger-to-nose and heel-to-shin performed accurately bilaterally. Gait and Station: Arises from chair with  difficulty. Stance is  Broad based slightly. Gait demonstrates small stride and mild imbalance . Uses a walker Reflexes: 1+ and symmetric. Toes downgoing.      ASSESSMENT: 49 year Caucasian lady with transient episode of confusion altered mental status and left-sided weakness in July 2015 possible TIA versus seizure and postictal confusion and weakness. Recurrent episodes of confusion in the setting of urinary tract infections likely mild cognitive impairment versus early dementia. Recurrent falls likely multifactorial due to combination of severe degenerative spine disease, diabetic neuropathy and cognitive impairment and obesity    PLAN: I had a long discussion with the patient and her son regarding her episode of altered consciousness followed by transient left-sided weakness and discussed differential diagnosis, my examination findings, plan for evaluation, treatment and answered questions. I recommend she check EEG as well as fasting lipid profile. Continue Plavix for stroke prevention with strict control of diabetes with hemoglobin A1c goal below 7, lipids with LDL cholesterol goal below 70 mg percent and hypertension with blood pressure goal below 120/80. Check vitamin B12, TSH and RPR for  treatable causes of memory loss. Patient was advised about fall and safety precautions and continue with home physical therapy and use of walker at  all times.   Note: This document was prepared with digital dictation and possible smart phrase technology. Any transcriptional errors that result from this process are unintentional.

## 2014-04-23 DIAGNOSIS — I69959 Hemiplegia and hemiparesis following unspecified cerebrovascular disease affecting unspecified side: Secondary | ICD-10-CM | POA: Diagnosis not present

## 2014-04-23 DIAGNOSIS — F0391 Unspecified dementia with behavioral disturbance: Secondary | ICD-10-CM | POA: Diagnosis not present

## 2014-04-23 DIAGNOSIS — I509 Heart failure, unspecified: Secondary | ICD-10-CM | POA: Diagnosis not present

## 2014-04-23 DIAGNOSIS — M1990 Unspecified osteoarthritis, unspecified site: Secondary | ICD-10-CM | POA: Diagnosis not present

## 2014-04-23 DIAGNOSIS — F03918 Unspecified dementia, unspecified severity, with other behavioral disturbance: Secondary | ICD-10-CM | POA: Diagnosis not present

## 2014-04-23 DIAGNOSIS — I2510 Atherosclerotic heart disease of native coronary artery without angina pectoris: Secondary | ICD-10-CM | POA: Diagnosis not present

## 2014-04-23 DIAGNOSIS — E119 Type 2 diabetes mellitus without complications: Secondary | ICD-10-CM | POA: Diagnosis not present

## 2014-04-24 DIAGNOSIS — F0391 Unspecified dementia with behavioral disturbance: Secondary | ICD-10-CM | POA: Diagnosis not present

## 2014-04-24 DIAGNOSIS — I2510 Atherosclerotic heart disease of native coronary artery without angina pectoris: Secondary | ICD-10-CM | POA: Diagnosis not present

## 2014-04-24 DIAGNOSIS — M1990 Unspecified osteoarthritis, unspecified site: Secondary | ICD-10-CM | POA: Diagnosis not present

## 2014-04-24 DIAGNOSIS — E119 Type 2 diabetes mellitus without complications: Secondary | ICD-10-CM | POA: Diagnosis not present

## 2014-04-24 DIAGNOSIS — I509 Heart failure, unspecified: Secondary | ICD-10-CM | POA: Diagnosis not present

## 2014-04-24 DIAGNOSIS — I69959 Hemiplegia and hemiparesis following unspecified cerebrovascular disease affecting unspecified side: Secondary | ICD-10-CM | POA: Diagnosis not present

## 2014-04-24 DIAGNOSIS — F03918 Unspecified dementia, unspecified severity, with other behavioral disturbance: Secondary | ICD-10-CM | POA: Diagnosis not present

## 2014-04-25 DIAGNOSIS — I2510 Atherosclerotic heart disease of native coronary artery without angina pectoris: Secondary | ICD-10-CM | POA: Diagnosis not present

## 2014-04-25 DIAGNOSIS — E119 Type 2 diabetes mellitus without complications: Secondary | ICD-10-CM | POA: Diagnosis not present

## 2014-04-25 DIAGNOSIS — F03918 Unspecified dementia, unspecified severity, with other behavioral disturbance: Secondary | ICD-10-CM | POA: Diagnosis not present

## 2014-04-25 DIAGNOSIS — M1990 Unspecified osteoarthritis, unspecified site: Secondary | ICD-10-CM | POA: Diagnosis not present

## 2014-04-25 DIAGNOSIS — I509 Heart failure, unspecified: Secondary | ICD-10-CM | POA: Diagnosis not present

## 2014-04-25 DIAGNOSIS — I69959 Hemiplegia and hemiparesis following unspecified cerebrovascular disease affecting unspecified side: Secondary | ICD-10-CM | POA: Diagnosis not present

## 2014-04-25 DIAGNOSIS — F0391 Unspecified dementia with behavioral disturbance: Secondary | ICD-10-CM | POA: Diagnosis not present

## 2014-04-26 DIAGNOSIS — I509 Heart failure, unspecified: Secondary | ICD-10-CM | POA: Diagnosis not present

## 2014-04-26 DIAGNOSIS — I2510 Atherosclerotic heart disease of native coronary artery without angina pectoris: Secondary | ICD-10-CM | POA: Diagnosis not present

## 2014-04-26 DIAGNOSIS — F0391 Unspecified dementia with behavioral disturbance: Secondary | ICD-10-CM | POA: Diagnosis not present

## 2014-04-26 DIAGNOSIS — I69959 Hemiplegia and hemiparesis following unspecified cerebrovascular disease affecting unspecified side: Secondary | ICD-10-CM | POA: Diagnosis not present

## 2014-04-26 DIAGNOSIS — M1990 Unspecified osteoarthritis, unspecified site: Secondary | ICD-10-CM | POA: Diagnosis not present

## 2014-04-26 DIAGNOSIS — F03918 Unspecified dementia, unspecified severity, with other behavioral disturbance: Secondary | ICD-10-CM | POA: Diagnosis not present

## 2014-04-26 DIAGNOSIS — E119 Type 2 diabetes mellitus without complications: Secondary | ICD-10-CM | POA: Diagnosis not present

## 2014-04-27 ENCOUNTER — Ambulatory Visit: Payer: Self-pay | Admitting: Internal Medicine

## 2014-04-27 DIAGNOSIS — I69959 Hemiplegia and hemiparesis following unspecified cerebrovascular disease affecting unspecified side: Secondary | ICD-10-CM | POA: Diagnosis not present

## 2014-04-27 DIAGNOSIS — I509 Heart failure, unspecified: Secondary | ICD-10-CM | POA: Diagnosis not present

## 2014-04-27 DIAGNOSIS — F0391 Unspecified dementia with behavioral disturbance: Secondary | ICD-10-CM | POA: Diagnosis not present

## 2014-04-27 DIAGNOSIS — M1990 Unspecified osteoarthritis, unspecified site: Secondary | ICD-10-CM | POA: Diagnosis not present

## 2014-04-27 DIAGNOSIS — F03918 Unspecified dementia, unspecified severity, with other behavioral disturbance: Secondary | ICD-10-CM | POA: Diagnosis not present

## 2014-04-27 DIAGNOSIS — I2510 Atherosclerotic heart disease of native coronary artery without angina pectoris: Secondary | ICD-10-CM | POA: Diagnosis not present

## 2014-04-27 DIAGNOSIS — E119 Type 2 diabetes mellitus without complications: Secondary | ICD-10-CM | POA: Diagnosis not present

## 2014-04-28 ENCOUNTER — Encounter: Payer: Self-pay | Admitting: *Deleted

## 2014-04-28 DIAGNOSIS — E119 Type 2 diabetes mellitus without complications: Secondary | ICD-10-CM | POA: Diagnosis not present

## 2014-04-28 DIAGNOSIS — I69959 Hemiplegia and hemiparesis following unspecified cerebrovascular disease affecting unspecified side: Secondary | ICD-10-CM | POA: Diagnosis not present

## 2014-04-28 DIAGNOSIS — I509 Heart failure, unspecified: Secondary | ICD-10-CM | POA: Diagnosis not present

## 2014-04-28 DIAGNOSIS — F05 Delirium due to known physiological condition: Secondary | ICD-10-CM | POA: Diagnosis not present

## 2014-04-28 DIAGNOSIS — F0391 Unspecified dementia with behavioral disturbance: Secondary | ICD-10-CM | POA: Diagnosis not present

## 2014-04-28 DIAGNOSIS — F03918 Unspecified dementia, unspecified severity, with other behavioral disturbance: Secondary | ICD-10-CM | POA: Diagnosis not present

## 2014-04-28 DIAGNOSIS — I2510 Atherosclerotic heart disease of native coronary artery without angina pectoris: Secondary | ICD-10-CM | POA: Diagnosis not present

## 2014-04-28 DIAGNOSIS — R413 Other amnesia: Secondary | ICD-10-CM | POA: Diagnosis not present

## 2014-04-28 DIAGNOSIS — M1990 Unspecified osteoarthritis, unspecified site: Secondary | ICD-10-CM | POA: Diagnosis not present

## 2014-04-28 LAB — TSH: TSH: 1.94 u[IU]/mL (ref 0.41–5.90)

## 2014-04-28 LAB — LIPID PANEL
Cholesterol: 163 mg/dL (ref 0–200)
HDL: 40 mg/dL (ref 35–70)
LDL Cholesterol: 79 mg/dL
Triglycerides: 218 mg/dL — AB (ref 40–160)

## 2014-04-29 ENCOUNTER — Encounter: Payer: Self-pay | Admitting: Internal Medicine

## 2014-04-29 DIAGNOSIS — M1990 Unspecified osteoarthritis, unspecified site: Secondary | ICD-10-CM | POA: Diagnosis not present

## 2014-04-29 DIAGNOSIS — I69959 Hemiplegia and hemiparesis following unspecified cerebrovascular disease affecting unspecified side: Secondary | ICD-10-CM | POA: Diagnosis not present

## 2014-04-29 DIAGNOSIS — I509 Heart failure, unspecified: Secondary | ICD-10-CM | POA: Diagnosis not present

## 2014-04-29 DIAGNOSIS — F0391 Unspecified dementia with behavioral disturbance: Secondary | ICD-10-CM | POA: Diagnosis not present

## 2014-04-29 DIAGNOSIS — I2510 Atherosclerotic heart disease of native coronary artery without angina pectoris: Secondary | ICD-10-CM | POA: Diagnosis not present

## 2014-04-29 DIAGNOSIS — F03918 Unspecified dementia, unspecified severity, with other behavioral disturbance: Secondary | ICD-10-CM | POA: Diagnosis not present

## 2014-04-29 DIAGNOSIS — E119 Type 2 diabetes mellitus without complications: Secondary | ICD-10-CM | POA: Diagnosis not present

## 2014-05-03 ENCOUNTER — Other Ambulatory Visit (INDEPENDENT_AMBULATORY_CARE_PROVIDER_SITE_OTHER): Payer: Medicare Other | Admitting: Radiology

## 2014-05-03 DIAGNOSIS — F05 Delirium due to known physiological condition: Secondary | ICD-10-CM | POA: Diagnosis not present

## 2014-05-04 DIAGNOSIS — F0391 Unspecified dementia with behavioral disturbance: Secondary | ICD-10-CM | POA: Diagnosis not present

## 2014-05-04 DIAGNOSIS — E119 Type 2 diabetes mellitus without complications: Secondary | ICD-10-CM | POA: Diagnosis not present

## 2014-05-04 DIAGNOSIS — I2510 Atherosclerotic heart disease of native coronary artery without angina pectoris: Secondary | ICD-10-CM | POA: Diagnosis not present

## 2014-05-04 DIAGNOSIS — I509 Heart failure, unspecified: Secondary | ICD-10-CM | POA: Diagnosis not present

## 2014-05-04 DIAGNOSIS — I69959 Hemiplegia and hemiparesis following unspecified cerebrovascular disease affecting unspecified side: Secondary | ICD-10-CM | POA: Diagnosis not present

## 2014-05-04 DIAGNOSIS — M1990 Unspecified osteoarthritis, unspecified site: Secondary | ICD-10-CM | POA: Diagnosis not present

## 2014-05-04 DIAGNOSIS — F03918 Unspecified dementia, unspecified severity, with other behavioral disturbance: Secondary | ICD-10-CM | POA: Diagnosis not present

## 2014-05-05 ENCOUNTER — Encounter: Payer: Self-pay | Admitting: Internal Medicine

## 2014-05-05 ENCOUNTER — Ambulatory Visit (INDEPENDENT_AMBULATORY_CARE_PROVIDER_SITE_OTHER): Payer: Medicare Other | Admitting: Internal Medicine

## 2014-05-05 VITALS — BP 132/80 | HR 66 | Temp 98.7°F | Resp 10 | Wt 198.6 lb

## 2014-05-05 DIAGNOSIS — I10 Essential (primary) hypertension: Secondary | ICD-10-CM

## 2014-05-05 DIAGNOSIS — IMO0002 Reserved for concepts with insufficient information to code with codable children: Secondary | ICD-10-CM | POA: Diagnosis not present

## 2014-05-05 DIAGNOSIS — E1129 Type 2 diabetes mellitus with other diabetic kidney complication: Secondary | ICD-10-CM | POA: Diagnosis not present

## 2014-05-05 DIAGNOSIS — M48062 Spinal stenosis, lumbar region with neurogenic claudication: Secondary | ICD-10-CM

## 2014-05-05 DIAGNOSIS — M1990 Unspecified osteoarthritis, unspecified site: Secondary | ICD-10-CM | POA: Insufficient documentation

## 2014-05-05 DIAGNOSIS — M1710 Unilateral primary osteoarthritis, unspecified knee: Secondary | ICD-10-CM | POA: Diagnosis not present

## 2014-05-05 DIAGNOSIS — I6350 Cerebral infarction due to unspecified occlusion or stenosis of unspecified cerebral artery: Secondary | ICD-10-CM

## 2014-05-05 DIAGNOSIS — Z23 Encounter for immunization: Secondary | ICD-10-CM | POA: Diagnosis not present

## 2014-05-05 DIAGNOSIS — Z1211 Encounter for screening for malignant neoplasm of colon: Secondary | ICD-10-CM

## 2014-05-05 DIAGNOSIS — M170 Bilateral primary osteoarthritis of knee: Secondary | ICD-10-CM

## 2014-05-05 MED ORDER — OXYCODONE-ACETAMINOPHEN 5-325 MG PO TABS: 1.0000 | ORAL_TABLET | Freq: Three times a day (TID) | ORAL | Status: DC | PRN

## 2014-05-05 MED ORDER — ZOSTER VACCINE LIVE 19400 UNT/0.65ML ~~LOC~~ SOLR: 0.6500 mL | Freq: Once | SUBCUTANEOUS | Status: DC

## 2014-05-05 NOTE — Progress Notes (Signed)
Patient ID: Abigail Brooks, female   DOB: 08/19/1935, 79 y.o.   MRN: [id number]    Chief Complaint  Patient presents with  . Follow-up    Patient following up from UTI . Patient completed injections 1 week ago Tuesday   . Immunizations    Flu Vaccine today   . Knee Pain    Arthritis in both knees    Allergies  Allergen Reactions  . Morphine And Related Other (See Comments)    Makes her crazy  . Enalapril Swelling    Facial swelling   HPI 79 y/o female patent here for follow up. She was diagnosed with another episode of uti and her antibiotic was switched to im ceftriaxone on last visit. She has completed the course and son who is present with her mentions she has clinically improved significantly. She is working with therapy team. Her strength has improved. No further falls reported. She is now able to use her walker. Her knee pain has been bothering her more recently. She is taking oxycodone 5 mg bid for now She has history of HTN, Dementia, CAD, CHF, DM. Sleeping well with trazodone Taking rest of her medications  Review of Systems  Constitutional: Negative for fever, chills, weight loss, diaphoresis.  HENT: Negative for congestion, hearing loss and sore throat.   Eyes: Negative for blurred vision Respiratory: Negative for cough, sputum production, shortness of breath and wheezing.   Cardiovascular: Negative for chest pain, palpitations, orthopnea.   Gastrointestinal: Negative for heartburn, nausea, vomiting, abdominal pain Genitourinary: positive for dysuria. No flank pain.  Skin: Negative for itching and rash.  Neurological: Negative for dizziness, tingling, focal weakness and headaches.  Psychiatric/Behavioral: positive for depression and memory loss. The patient is not nervous/anxious.    Past Medical History  Diagnosis Date  . Coronary artery disease     stents x 2, sees Dr. Halina Andreas practice  . Congestive heart failure     2012  . Myocardial infarction    "unsure of when"  . Hypertension   . Diabetes mellitus   . Shortness of breath   . Anemia   . Blood transfusion     when had hysterectomy  . Hypothyroidism     has had hx of treatment  . Urinary frequency   . Urinary tract infection     hx of  . GERD (gastroesophageal reflux disease)   . H/O hiatal hernia   . Arthritis   . Depression   . Spinal stenosis     with bone spurs and ruptured disc  . Cataract   . Blood transfusion without reported diagnosis   . Complications affecting other specified body systems, hypertension   . Other malaise and fatigue   . Dysuria   . Unspecified vitamin D deficiency   . Anxiety state, unspecified   . Other B-complex deficiencies   . Abnormality of gait   . Other and unspecified hyperlipidemia    Current Outpatient Prescriptions on File Prior to Visit  Medication Sig Dispense Refill  . atorvastatin (LIPITOR) 10 MG tablet Take 1 tablet (10 mg total) by mouth daily at 6 PM.  30 tablet  3  . Calcium Carbonate-Vitamin D (CALCIUM + D PO) Take 1 tablet by mouth daily. Calcium 315 mg,Vitamin D3 250 I.U.      . clonazePAM (KLONOPIN) 0.5 MG tablet Take 1 tablet (0.5 mg total) by mouth 2 (two) times daily.  30 tablet  0  . clopidogrel (PLAVIX) 75 MG tablet Take 1  tablet (75 mg total) by mouth daily.  30 tablet  3  . diclofenac sodium (VOLTAREN) 1 % GEL Apply 4 g topically 2 (two) times daily as needed (for knee pain).      . fenofibrate 54 MG tablet Take 1 tablet (54 mg total) by mouth daily.      . ferrous sulfate 325 (65 FE) MG tablet Take 325 mg by mouth daily with breakfast.      . fesoterodine (TOVIAZ) 4 MG TB24 tablet Take 4 mg by mouth daily.      . fish oil-omega-3 fatty acids 1000 MG capsule Take 1 g by mouth daily.       . hydrALAZINE (APRESOLINE) 25 MG tablet Take 25 mg by mouth 3 (three) times daily.      Marland Kitchen losartan-hydrochlorothiazide (HYZAAR) 50-12.5 MG per tablet TAKE 1 TABLET BY MOUTH DAILY.  90 tablet  2  . metFORMIN (GLUCOPHAGE) 500 MG  tablet Take 1 tablet (500 mg total) by mouth daily with breakfast.  90 tablet  3  . metoprolol (LOPRESSOR) 100 MG tablet Take 1 tablet (100 mg total) by mouth 2 (two) times daily.  60 tablet  5  . nitroGLYCERIN (NITROSTAT) 0.4 MG SL tablet Place 0.4 mg under the tongue every 5 (five) minutes as needed for chest pain. Max 3 tablets in 15 minutes      . phenazopyridine (PYRIDIUM) 100 MG tablet Take 1 tablet (100 mg total) by mouth 3 (three) times daily as needed for pain.  10 tablet  0  . polyethylene glycol (MIRALAX / GLYCOLAX) packet Take 17 g by mouth daily as needed (pain).      . QUEtiapine (SEROQUEL) 25 MG tablet Take 1 tablet in the morning and 2 tablet in the evening  90 tablet  3  . senna-docusate (SENNA S) 8.6-50 MG per tablet Take 1 tablet by mouth 2 (two) times daily.      . traZODone (DESYREL) 25 mg TABS tablet Take 0.5 tablets (25 mg total) by mouth every other day. For 5 days and stop it  7 tablet  0  . [DISCONTINUED] oxybutynin (DITROPAN-XL) 5 MG 24 hr tablet Take one tablet by mouth once daily for bladder.       No current facility-administered medications on file prior to visit.   Physical exam BP 132/80  Pulse 66  Temp(Src) 98.7 F (37.1 C) (Oral)  Resp 10  Wt 198 lb 9.6 oz (90.084 kg)  SpO2 96%  General- elderly female in no acute distress, obese Head- atraumatic, normocephalic Neck- no lymphadenopathy Mouth- normal mucus membrane Cardiovascular- normal s1,s2 Respiratory- bilateral clear to auscultation, no wheeze, no rhonchi, no crackles Abdomen- bowel sounds present, soft, non tender Musculoskeletal- able to move all 4 extremities, trace leg edema, on walker, generalized weakness Neurological- alert, no focal deficit, normal foot exam, good distal pulses, long grown toe nails Skin- warm and dry Psychiatry- has dementia  Assessment/plan  1. Essential hypertension, benign Stable this visit. Reviewed home readings. No med changes today. hyzaar and hydralazine with  lopressor to be continued. continue statin - Lipid Panel; Future - CBC with Differential; Future  2. Type 2 diabetes mellitus with renal manifestations, controlled Continue metofrmin, monitor renal function and consider switching metformin vs taking off her oral hypoglycemic depending on her a1c. Check urine microalbumin today. Sees ophthalmology once a year. Normal foot exam. Flu vaccine provided - Microalbumin/Creatinine Ratio, Urine; Future - CMP; Future - Hemoglobin A1c; Future - Lipid Panel; Future - Microalbumin/Creatinine Ratio,  Urine  3. Lumbar stenosis with neurogenic claudication Advised on increasing her percocet to 5-325 1-2 tab q8h prn and reassess   4. Knee OA Pain med adjusted, reassess, consider ortho referral for steroid injection

## 2014-05-06 ENCOUNTER — Telehealth: Payer: Self-pay | Admitting: *Deleted

## 2014-05-06 ENCOUNTER — Encounter: Payer: Self-pay | Admitting: Neurology

## 2014-05-06 DIAGNOSIS — I509 Heart failure, unspecified: Secondary | ICD-10-CM | POA: Diagnosis not present

## 2014-05-06 DIAGNOSIS — F0391 Unspecified dementia with behavioral disturbance: Secondary | ICD-10-CM | POA: Diagnosis not present

## 2014-05-06 DIAGNOSIS — M1990 Unspecified osteoarthritis, unspecified site: Secondary | ICD-10-CM | POA: Diagnosis not present

## 2014-05-06 DIAGNOSIS — E119 Type 2 diabetes mellitus without complications: Secondary | ICD-10-CM | POA: Diagnosis not present

## 2014-05-06 DIAGNOSIS — I2510 Atherosclerotic heart disease of native coronary artery without angina pectoris: Secondary | ICD-10-CM | POA: Diagnosis not present

## 2014-05-06 DIAGNOSIS — F03918 Unspecified dementia, unspecified severity, with other behavioral disturbance: Secondary | ICD-10-CM | POA: Diagnosis not present

## 2014-05-06 DIAGNOSIS — I69959 Hemiplegia and hemiparesis following unspecified cerebrovascular disease affecting unspecified side: Secondary | ICD-10-CM | POA: Diagnosis not present

## 2014-05-06 LAB — MICROALBUMIN / CREATININE URINE RATIO
Creatinine, Ur: 54.3 mg/dL (ref 15.0–278.0)
MICROALB/CREAT RATIO: 70.5 mg/g{creat} — AB (ref 0.0–30.0)
MICROALBUM., U, RANDOM: 38.3 ug/mL — AB (ref 0.0–17.0)

## 2014-05-06 NOTE — Addendum Note (Signed)
Addended by: Maurice Small on: 05/06/2014 09:01 AM   Modules accepted: Orders

## 2014-05-06 NOTE — Telephone Encounter (Signed)
Called patient and and relayed normal EEG report

## 2014-05-07 DIAGNOSIS — I509 Heart failure, unspecified: Secondary | ICD-10-CM | POA: Diagnosis not present

## 2014-05-07 DIAGNOSIS — M1990 Unspecified osteoarthritis, unspecified site: Secondary | ICD-10-CM | POA: Diagnosis not present

## 2014-05-07 DIAGNOSIS — F03918 Unspecified dementia, unspecified severity, with other behavioral disturbance: Secondary | ICD-10-CM | POA: Diagnosis not present

## 2014-05-07 DIAGNOSIS — I2510 Atherosclerotic heart disease of native coronary artery without angina pectoris: Secondary | ICD-10-CM | POA: Diagnosis not present

## 2014-05-07 DIAGNOSIS — E119 Type 2 diabetes mellitus without complications: Secondary | ICD-10-CM | POA: Diagnosis not present

## 2014-05-07 DIAGNOSIS — I69959 Hemiplegia and hemiparesis following unspecified cerebrovascular disease affecting unspecified side: Secondary | ICD-10-CM | POA: Diagnosis not present

## 2014-05-07 DIAGNOSIS — F0391 Unspecified dementia with behavioral disturbance: Secondary | ICD-10-CM | POA: Diagnosis not present

## 2014-05-08 ENCOUNTER — Other Ambulatory Visit: Payer: Self-pay | Admitting: Internal Medicine

## 2014-05-11 DIAGNOSIS — I509 Heart failure, unspecified: Secondary | ICD-10-CM | POA: Diagnosis not present

## 2014-05-11 DIAGNOSIS — E119 Type 2 diabetes mellitus without complications: Secondary | ICD-10-CM | POA: Diagnosis not present

## 2014-05-11 DIAGNOSIS — I2510 Atherosclerotic heart disease of native coronary artery without angina pectoris: Secondary | ICD-10-CM | POA: Diagnosis not present

## 2014-05-11 DIAGNOSIS — M1990 Unspecified osteoarthritis, unspecified site: Secondary | ICD-10-CM | POA: Diagnosis not present

## 2014-05-11 DIAGNOSIS — F0391 Unspecified dementia with behavioral disturbance: Secondary | ICD-10-CM | POA: Diagnosis not present

## 2014-05-11 DIAGNOSIS — I69959 Hemiplegia and hemiparesis following unspecified cerebrovascular disease affecting unspecified side: Secondary | ICD-10-CM | POA: Diagnosis not present

## 2014-05-11 DIAGNOSIS — F03918 Unspecified dementia, unspecified severity, with other behavioral disturbance: Secondary | ICD-10-CM | POA: Diagnosis not present

## 2014-05-13 ENCOUNTER — Other Ambulatory Visit: Payer: Medicare Other

## 2014-05-13 DIAGNOSIS — Z1211 Encounter for screening for malignant neoplasm of colon: Secondary | ICD-10-CM

## 2014-05-15 LAB — FECAL OCCULT BLOOD, IMMUNOCHEMICAL: Fecal Occult Bld: POSITIVE — AB

## 2014-05-21 ENCOUNTER — Other Ambulatory Visit: Payer: Self-pay | Admitting: *Deleted

## 2014-05-21 ENCOUNTER — Telehealth: Payer: Self-pay | Admitting: *Deleted

## 2014-05-21 DIAGNOSIS — Z1211 Encounter for screening for malignant neoplasm of colon: Secondary | ICD-10-CM

## 2014-05-21 NOTE — Telephone Encounter (Signed)
Message copied by Lamont SnowballICE, SHARON L on Fri May 21, 2014 10:47 AM ------      Message from: [name], MAHIMA      Created: Thu May 20, 2014  6:22 PM       Your stool card is positive for blood. I will recommend gi appointment for further workup- possible scope. Make gi appointment. ------

## 2014-05-21 NOTE — Telephone Encounter (Signed)
Spoke with patient's son Onalee Hua([name]), informed him that he would be getting a phone call from GI doctor's office to schedule a appointment. He states that he understood and would be waiting for a call.

## 2014-05-29 ENCOUNTER — Inpatient Hospital Stay (HOSPITAL_COMMUNITY): Payer: Medicare Other

## 2014-05-29 ENCOUNTER — Emergency Department (HOSPITAL_COMMUNITY): Payer: Medicare Other

## 2014-05-29 ENCOUNTER — Inpatient Hospital Stay (HOSPITAL_COMMUNITY)
Admission: EM | Admit: 2014-05-29 | Discharge: 2014-05-30 | DRG: 069 | Disposition: A | Discharge status: Home / Self Care | Payer: Medicare Other | Attending: Internal Medicine | Admitting: Internal Medicine

## 2014-05-29 DIAGNOSIS — F0391 Unspecified dementia with behavioral disturbance: Secondary | ICD-10-CM | POA: Diagnosis present

## 2014-05-29 DIAGNOSIS — N3000 Acute cystitis without hematuria: Secondary | ICD-10-CM | POA: Diagnosis not present

## 2014-05-29 DIAGNOSIS — I5032 Chronic diastolic (congestive) heart failure: Secondary | ICD-10-CM | POA: Diagnosis present

## 2014-05-29 DIAGNOSIS — I252 Old myocardial infarction: Secondary | ICD-10-CM | POA: Diagnosis not present

## 2014-05-29 DIAGNOSIS — Z90710 Acquired absence of both cervix and uterus: Secondary | ICD-10-CM

## 2014-05-29 DIAGNOSIS — K219 Gastro-esophageal reflux disease without esophagitis: Secondary | ICD-10-CM | POA: Diagnosis present

## 2014-05-29 DIAGNOSIS — G9519 Other vascular myelopathies: Secondary | ICD-10-CM | POA: Diagnosis present

## 2014-05-29 DIAGNOSIS — I129 Hypertensive chronic kidney disease with stage 1 through stage 4 chronic kidney disease, or unspecified chronic kidney disease: Secondary | ICD-10-CM | POA: Diagnosis present

## 2014-05-29 DIAGNOSIS — N3281 Overactive bladder: Secondary | ICD-10-CM | POA: Diagnosis present

## 2014-05-29 DIAGNOSIS — I10 Essential (primary) hypertension: Secondary | ICD-10-CM | POA: Diagnosis present

## 2014-05-29 DIAGNOSIS — E039 Hypothyroidism, unspecified: Secondary | ICD-10-CM | POA: Diagnosis present

## 2014-05-29 DIAGNOSIS — R531 Weakness: Secondary | ICD-10-CM | POA: Diagnosis present

## 2014-05-29 DIAGNOSIS — E1169 Type 2 diabetes mellitus with other specified complication: Secondary | ICD-10-CM

## 2014-05-29 DIAGNOSIS — R0989 Other specified symptoms and signs involving the circulatory and respiratory systems: Secondary | ICD-10-CM

## 2014-05-29 DIAGNOSIS — I63522 Cerebral infarction due to unspecified occlusion or stenosis of left anterior cerebral artery: Secondary | ICD-10-CM | POA: Diagnosis not present

## 2014-05-29 DIAGNOSIS — Z9089 Acquired absence of other organs: Secondary | ICD-10-CM | POA: Diagnosis not present

## 2014-05-29 DIAGNOSIS — M949 Disorder of cartilage, unspecified: Secondary | ICD-10-CM

## 2014-05-29 DIAGNOSIS — R404 Transient alteration of awareness: Secondary | ICD-10-CM | POA: Diagnosis not present

## 2014-05-29 DIAGNOSIS — I6523 Occlusion and stenosis of bilateral carotid arteries: Secondary | ICD-10-CM | POA: Diagnosis present

## 2014-05-29 DIAGNOSIS — W19XXXD Unspecified fall, subsequent encounter: Secondary | ICD-10-CM

## 2014-05-29 DIAGNOSIS — I6782 Cerebral ischemia: Principal | ICD-10-CM | POA: Diagnosis present

## 2014-05-29 DIAGNOSIS — Z0000 Encounter for general adult medical examination without abnormal findings: Secondary | ICD-10-CM

## 2014-05-29 DIAGNOSIS — E1122 Type 2 diabetes mellitus with diabetic chronic kidney disease: Secondary | ICD-10-CM | POA: Diagnosis present

## 2014-05-29 DIAGNOSIS — E785 Hyperlipidemia, unspecified: Secondary | ICD-10-CM

## 2014-05-29 DIAGNOSIS — N058 Unspecified nephritic syndrome with other morphologic changes: Secondary | ICD-10-CM | POA: Diagnosis not present

## 2014-05-29 DIAGNOSIS — Z886 Allergy status to analgesic agent status: Secondary | ICD-10-CM

## 2014-05-29 DIAGNOSIS — M6289 Other specified disorders of muscle: Secondary | ICD-10-CM | POA: Diagnosis not present

## 2014-05-29 DIAGNOSIS — N390 Urinary tract infection, site not specified: Secondary | ICD-10-CM | POA: Diagnosis present

## 2014-05-29 DIAGNOSIS — M4806 Spinal stenosis, lumbar region: Secondary | ICD-10-CM | POA: Diagnosis present

## 2014-05-29 DIAGNOSIS — I2510 Atherosclerotic heart disease of native coronary artery without angina pectoris: Secondary | ICD-10-CM | POA: Diagnosis present

## 2014-05-29 DIAGNOSIS — F419 Anxiety disorder, unspecified: Secondary | ICD-10-CM | POA: Diagnosis present

## 2014-05-29 DIAGNOSIS — I639 Cerebral infarction, unspecified: Secondary | ICD-10-CM | POA: Diagnosis present

## 2014-05-29 DIAGNOSIS — R296 Repeated falls: Secondary | ICD-10-CM

## 2014-05-29 DIAGNOSIS — F329 Major depressive disorder, single episode, unspecified: Secondary | ICD-10-CM | POA: Diagnosis present

## 2014-05-29 DIAGNOSIS — F32A Depression, unspecified: Secondary | ICD-10-CM

## 2014-05-29 DIAGNOSIS — Z9861 Coronary angioplasty status: Secondary | ICD-10-CM

## 2014-05-29 DIAGNOSIS — I517 Cardiomegaly: Secondary | ICD-10-CM | POA: Diagnosis not present

## 2014-05-29 DIAGNOSIS — R413 Other amnesia: Secondary | ICD-10-CM

## 2014-05-29 DIAGNOSIS — M1990 Unspecified osteoarthritis, unspecified site: Secondary | ICD-10-CM | POA: Diagnosis present

## 2014-05-29 DIAGNOSIS — Z9849 Cataract extraction status, unspecified eye: Secondary | ICD-10-CM | POA: Diagnosis not present

## 2014-05-29 DIAGNOSIS — E1129 Type 2 diabetes mellitus with other diabetic kidney complication: Secondary | ICD-10-CM | POA: Diagnosis present

## 2014-05-29 DIAGNOSIS — F17220 Nicotine dependence, chewing tobacco, uncomplicated: Secondary | ICD-10-CM | POA: Diagnosis present

## 2014-05-29 DIAGNOSIS — M48062 Spinal stenosis, lumbar region with neurogenic claudication: Secondary | ICD-10-CM | POA: Diagnosis present

## 2014-05-29 DIAGNOSIS — N183 Chronic kidney disease, stage 3 (moderate): Secondary | ICD-10-CM | POA: Diagnosis present

## 2014-05-29 DIAGNOSIS — R918 Other nonspecific abnormal finding of lung field: Secondary | ICD-10-CM | POA: Diagnosis not present

## 2014-05-29 DIAGNOSIS — W19XXXA Unspecified fall, initial encounter: Secondary | ICD-10-CM | POA: Diagnosis present

## 2014-05-29 DIAGNOSIS — M170 Bilateral primary osteoarthritis of knee: Secondary | ICD-10-CM

## 2014-05-29 DIAGNOSIS — F22 Delusional disorders: Secondary | ICD-10-CM

## 2014-05-29 DIAGNOSIS — F03918 Unspecified dementia, unspecified severity, with other behavioral disturbance: Secondary | ICD-10-CM

## 2014-05-29 DIAGNOSIS — E1159 Type 2 diabetes mellitus with other circulatory complications: Secondary | ICD-10-CM

## 2014-05-29 LAB — URINE MICROSCOPIC-ADD ON

## 2014-05-29 LAB — COMPREHENSIVE METABOLIC PANEL
ALBUMIN: 3.5 g/dL (ref 3.5–5.2)
ALT: 12 U/L (ref 0–35)
AST: 14 U/L (ref 0–37)
Alkaline Phosphatase: 64 U/L (ref 39–117)
Anion gap: 15 (ref 5–15)
BUN: 25 mg/dL — ABNORMAL HIGH (ref 6–23)
CALCIUM: 9.6 mg/dL (ref 8.4–10.5)
CO2: 24 mEq/L (ref 19–32)
CREATININE: 1.29 mg/dL — AB (ref 0.50–1.10)
Chloride: 101 mEq/L (ref 96–112)
GFR calc Af Amer: 45 mL/min — ABNORMAL LOW (ref >90)
GFR calc non Af Amer: 39 mL/min — ABNORMAL LOW (ref >90)
GLUCOSE: 239 mg/dL — AB (ref 70–99)
POTASSIUM: 4.1 meq/L (ref 3.7–5.3)
Sodium: 140 mEq/L (ref 137–147)
TOTAL PROTEIN: 7.1 g/dL (ref 6.0–8.3)
Total Bilirubin: 0.3 mg/dL (ref 0.3–1.2)

## 2014-05-29 LAB — PROTIME-INR
INR: 1.02 (ref 0.00–1.49)
Prothrombin Time: 13.4 seconds (ref 11.6–15.2)

## 2014-05-29 LAB — URINALYSIS, ROUTINE W REFLEX MICROSCOPIC
Bilirubin Urine: NEGATIVE
GLUCOSE, UA: NEGATIVE mg/dL
Ketones, ur: 15 mg/dL — AB
Nitrite: POSITIVE — AB
Protein, ur: NEGATIVE mg/dL
Specific Gravity, Urine: 1.018 (ref 1.005–1.030)
UROBILINOGEN UA: 1 mg/dL (ref 0.0–1.0)
pH: 5 (ref 5.0–8.0)

## 2014-05-29 LAB — I-STAT CHEM 8, ED
BUN: 26 mg/dL — ABNORMAL HIGH (ref 6–23)
CALCIUM ION: 1.21 mmol/L (ref 1.13–1.30)
Chloride: 103 mEq/L (ref 96–112)
Creatinine, Ser: 1.4 mg/dL — ABNORMAL HIGH (ref 0.50–1.10)
Glucose, Bld: 238 mg/dL — ABNORMAL HIGH (ref 70–99)
HCT: 40 % (ref 36.0–46.0)
HEMOGLOBIN: 13.6 g/dL (ref 12.0–15.0)
Potassium: 3.7 mEq/L (ref 3.7–5.3)
SODIUM: 141 meq/L (ref 137–147)
TCO2: 25 mmol/L (ref 0–100)

## 2014-05-29 LAB — DIFFERENTIAL
BASOS PCT: 0 % (ref 0–1)
Basophils Absolute: 0 10*3/uL (ref 0.0–0.1)
EOS ABS: 0.3 10*3/uL (ref 0.0–0.7)
EOS PCT: 4 % (ref 0–5)
Lymphocytes Relative: 18 % (ref 12–46)
Lymphs Abs: 1.3 10*3/uL (ref 0.7–4.0)
MONO ABS: 0.5 10*3/uL (ref 0.1–1.0)
MONOS PCT: 7 % (ref 3–12)
NEUTROS PCT: 71 % (ref 43–77)
Neutro Abs: 5.2 10*3/uL (ref 1.7–7.7)

## 2014-05-29 LAB — APTT: aPTT: 29 seconds (ref 24–37)

## 2014-05-29 LAB — CBC
HCT: 38.2 % (ref 36.0–46.0)
HEMOGLOBIN: 13 g/dL (ref 12.0–15.0)
MCH: 29.7 pg (ref 26.0–34.0)
MCHC: 34 g/dL (ref 30.0–36.0)
MCV: 87.4 fL (ref 78.0–100.0)
Platelets: 178 10*3/uL (ref 150–400)
RBC: 4.37 MIL/uL (ref 3.87–5.11)
RDW: 12.8 % (ref 11.5–15.5)
WBC: 7.2 10*3/uL (ref 4.0–10.5)

## 2014-05-29 LAB — GLUCOSE, CAPILLARY
GLUCOSE-CAPILLARY: 177 mg/dL — AB (ref 70–99)
GLUCOSE-CAPILLARY: 184 mg/dL — AB (ref 70–99)
GLUCOSE-CAPILLARY: 204 mg/dL — AB (ref 70–99)
Glucose-Capillary: 168 mg/dL — ABNORMAL HIGH (ref 70–99)

## 2014-05-29 LAB — I-STAT TROPONIN, ED: TROPONIN I, POC: 0 ng/mL (ref 0.00–0.08)

## 2014-05-29 LAB — HEMOGLOBIN A1C
Hgb A1c MFr Bld: 6.9 % — ABNORMAL HIGH (ref <5.7)
MEAN PLASMA GLUCOSE: 151 mg/dL — AB (ref <117)

## 2014-05-29 LAB — CBG MONITORING, ED: GLUCOSE-CAPILLARY: 219 mg/dL — AB (ref 70–99)

## 2014-05-29 MED ORDER — ATORVASTATIN CALCIUM 10 MG PO TABS
10.0000 mg | ORAL_TABLET | Freq: Every day | ORAL | Status: DC
  Administered 2014-05-29: 10 mg via ORAL
  Filled 2014-05-29 (×2): qty 1

## 2014-05-29 MED ORDER — OMEGA-3-ACID ETHYL ESTERS 1 G PO CAPS
1.0000 g | ORAL_CAPSULE | Freq: Every day | ORAL | Status: DC
  Administered 2014-05-29 – 2014-05-30 (×2): 1 g via ORAL
  Filled 2014-05-29 (×2): qty 1

## 2014-05-29 MED ORDER — CLONAZEPAM 0.5 MG PO TABS
0.5000 mg | ORAL_TABLET | Freq: Two times a day (BID) | ORAL | Status: DC
  Administered 2014-05-29 – 2014-05-30 (×3): 0.5 mg via ORAL
  Filled 2014-05-29 (×3): qty 1

## 2014-05-29 MED ORDER — POLYETHYLENE GLYCOL 3350 17 G PO PACK
17.0000 g | PACK | Freq: Every day | ORAL | Status: DC | PRN
  Filled 2014-05-29: qty 1

## 2014-05-29 MED ORDER — CALCIUM CARBONATE-VITAMIN D 250-125 MG-UNIT PO TABS: 1.0000 | ORAL_TABLET | Freq: Every day | ORAL | Status: DC

## 2014-05-29 MED ORDER — OMEGA-3 FATTY ACIDS 1000 MG PO CAPS: 1.0000 g | ORAL_CAPSULE | Freq: Every day | ORAL | Status: DC

## 2014-05-29 MED ORDER — METOPROLOL TARTRATE 100 MG PO TABS
100.0000 mg | ORAL_TABLET | Freq: Two times a day (BID) | ORAL | Status: DC
  Administered 2014-05-29 – 2014-05-30 (×3): 100 mg via ORAL
  Filled 2014-05-29 (×4): qty 1

## 2014-05-29 MED ORDER — CLOPIDOGREL BISULFATE 75 MG PO TABS
75.0000 mg | ORAL_TABLET | Freq: Every day | ORAL | Status: DC
  Administered 2014-05-29 – 2014-05-30 (×2): 75 mg via ORAL
  Filled 2014-05-29 (×2): qty 1

## 2014-05-29 MED ORDER — HYDROCHLOROTHIAZIDE 12.5 MG PO CAPS
12.5000 mg | ORAL_CAPSULE | Freq: Every day | ORAL | Status: DC
  Administered 2014-05-29 – 2014-05-30 (×2): 12.5 mg via ORAL
  Filled 2014-05-29 (×2): qty 1

## 2014-05-29 MED ORDER — QUETIAPINE FUMARATE 25 MG PO TABS
25.0000 mg | ORAL_TABLET | Freq: Every day | ORAL | Status: DC
  Administered 2014-05-29 – 2014-05-30 (×2): 25 mg via ORAL
  Filled 2014-05-29 (×2): qty 1

## 2014-05-29 MED ORDER — HYDRALAZINE HCL 25 MG PO TABS
25.0000 mg | ORAL_TABLET | Freq: Three times a day (TID) | ORAL | Status: DC
  Administered 2014-05-29 – 2014-05-30 (×3): 25 mg via ORAL
  Filled 2014-05-29 (×6): qty 1

## 2014-05-29 MED ORDER — FESOTERODINE FUMARATE ER 4 MG PO TB24
4.0000 mg | ORAL_TABLET | Freq: Every day | ORAL | Status: DC
  Administered 2014-05-29 – 2014-05-30 (×2): 4 mg via ORAL
  Filled 2014-05-29 (×2): qty 1

## 2014-05-29 MED ORDER — INSULIN ASPART 100 UNIT/ML ~~LOC~~ SOLN
0.0000 [IU] | Freq: Three times a day (TID) | SUBCUTANEOUS | Status: DC
  Administered 2014-05-29: 3 [IU] via SUBCUTANEOUS
  Administered 2014-05-30 (×2): 2 [IU] via SUBCUTANEOUS

## 2014-05-29 MED ORDER — QUETIAPINE FUMARATE 25 MG PO TABS
50.0000 mg | ORAL_TABLET | Freq: Every day | ORAL | Status: DC
  Administered 2014-05-29: 50 mg via ORAL
  Filled 2014-05-29 (×2): qty 2

## 2014-05-29 MED ORDER — SENNOSIDES-DOCUSATE SODIUM 8.6-50 MG PO TABS
1.0000 | ORAL_TABLET | Freq: Every evening | ORAL | Status: DC | PRN
  Filled 2014-05-29: qty 1

## 2014-05-29 MED ORDER — DEXTROSE 5 % IV SOLN
1.0000 g | Freq: Once | INTRAVENOUS | Status: AC
  Administered 2014-05-29: 1 g via INTRAVENOUS
  Filled 2014-05-29: qty 10

## 2014-05-29 MED ORDER — SENNOSIDES-DOCUSATE SODIUM 8.6-50 MG PO TABS
1.0000 | ORAL_TABLET | Freq: Two times a day (BID) | ORAL | Status: DC
  Administered 2014-05-29 – 2014-05-30 (×3): 1 via ORAL
  Filled 2014-05-29 (×4): qty 1

## 2014-05-29 MED ORDER — NITROGLYCERIN 0.4 MG SL SUBL: 0.4000 mg | SUBLINGUAL_TABLET | SUBLINGUAL | Status: DC | PRN

## 2014-05-29 MED ORDER — CEFTRIAXONE SODIUM 1 G IJ SOLR
1.0000 g | INTRAMUSCULAR | Status: DC
  Administered 2014-05-30: 1 g via INTRAVENOUS
  Filled 2014-05-29 (×2): qty 10

## 2014-05-29 MED ORDER — CALCIUM CARBONATE-VITAMIN D 500-200 MG-UNIT PO TABS
1.0000 | ORAL_TABLET | Freq: Every day | ORAL | Status: DC
  Administered 2014-05-29 – 2014-05-30 (×2): 1 via ORAL
  Filled 2014-05-29 (×3): qty 1

## 2014-05-29 MED ORDER — OXYCODONE-ACETAMINOPHEN 5-325 MG PO TABS: 1.0000 | ORAL_TABLET | Freq: Three times a day (TID) | ORAL | Status: DC | PRN

## 2014-05-29 MED ORDER — DICLOFENAC SODIUM 1 % TD GEL
4.0000 g | Freq: Two times a day (BID) | TRANSDERMAL | Status: DC | PRN
  Filled 2014-05-29: qty 100

## 2014-05-29 MED ORDER — HEPARIN SODIUM (PORCINE) 5000 UNIT/ML IJ SOLN
5000.0000 [IU] | Freq: Three times a day (TID) | INTRAMUSCULAR | Status: DC
  Administered 2014-05-29 – 2014-05-30 (×4): 5000 [IU] via SUBCUTANEOUS
  Filled 2014-05-29 (×5): qty 1

## 2014-05-29 MED ORDER — LOSARTAN POTASSIUM-HCTZ 50-12.5 MG PO TABS
1.0000 | ORAL_TABLET | Freq: Every day | ORAL | Status: DC
Start: 2014-05-29 — End: 2014-05-29

## 2014-05-29 MED ORDER — STROKE: EARLY STAGES OF RECOVERY BOOK
Freq: Once | Status: AC
  Administered 2014-05-29: 08:00:00
  Filled 2014-05-29: qty 1

## 2014-05-29 MED ORDER — FERROUS SULFATE 325 (65 FE) MG PO TABS
325.0000 mg | ORAL_TABLET | Freq: Every day | ORAL | Status: DC
  Administered 2014-05-29 – 2014-05-30 (×2): 325 mg via ORAL
  Filled 2014-05-29 (×3): qty 1

## 2014-05-29 MED ORDER — LOSARTAN POTASSIUM 50 MG PO TABS
50.0000 mg | ORAL_TABLET | Freq: Every day | ORAL | Status: DC
  Administered 2014-05-29 – 2014-05-30 (×2): 50 mg via ORAL
  Filled 2014-05-29 (×2): qty 1

## 2014-05-29 MED ORDER — FENOFIBRATE 54 MG PO TABS
54.0000 mg | ORAL_TABLET | Freq: Every day | ORAL | Status: DC
  Administered 2014-05-29 – 2014-05-30 (×2): 54 mg via ORAL
  Filled 2014-05-29 (×2): qty 1

## 2014-05-29 MED ORDER — ASPIRIN EC 81 MG PO TBEC
81.0000 mg | DELAYED_RELEASE_TABLET | Freq: Every day | ORAL | Status: DC
  Administered 2014-05-29 – 2014-05-30 (×2): 81 mg via ORAL
  Filled 2014-05-29 (×2): qty 1

## 2014-05-29 MED ORDER — CEFUROXIME AXETIL 250 MG PO TABS
250.0000 mg | ORAL_TABLET | Freq: Once | ORAL | Status: DC
  Filled 2014-05-29: qty 1

## 2014-05-29 NOTE — Consult Note (Addendum)
Referring Physician: ED    Chief Complaint: code stroke,   HPI:                                                                                                                                         Michela PitcherFrances L Vanmeter is an 79 y.o. female with multiple medical problems including HTN, DM, CAD s/p stenting, chronic congestive heart failure, MI, ischemic stroke 3 months ago with residual left sided weakness, [facility] by ambulance due to acute onset of the above stated symptoms. EMS was called because patient had a fall, and family reported that patient was sitting in a couch, slumped over to the left, and was weaker in the left side. She denies HA, vertigo, double vision, difficulty swallowing, focal numbness, confusion, or vision impairment. NIHSS 2. CT brain revealed no acute intracranial abnormality. Date last known well:  Time last known well:  tPA Given: no, minimal deficits. NIHSS: 2   Past Medical History  Diagnosis Date  . Coronary artery disease     stents x 2, sees Dr. Halina AndreasSmith, [facility]  . Congestive heart failure     2012  . Myocardial infarction     "unsure of when"  . Hypertension   . Diabetes mellitus   . Shortness of breath   . Anemia   . Blood transfusion     when had hysterectomy  . Hypothyroidism     has had hx of treatment  . Urinary frequency   . Urinary tract infection     hx of  . GERD (gastroesophageal reflux disease)   . H/O hiatal hernia   . Arthritis   . Depression   . Spinal stenosis     with bone spurs and ruptured disc  . Cataract   . Blood transfusion without reported diagnosis   . Complications affecting other specified body systems, hypertension   . Other malaise and fatigue   . Dysuria   . Unspecified vitamin D deficiency   . Anxiety state, unspecified   . Other B-complex deficiencies   . Abnormality of gait   . Other and unspecified hyperlipidemia     Past Surgical History  Procedure Laterality Date  . Cataract extraction  w/ intraocular lens  implant, bilateral    . Bladder suspension    . Coronary      stents x 2, stent implant placed9/21/06  . Back surgery      ruptured disc, lumbar area  . Appendectomy    . Cardiac catheterization    . Lumbar laminectomy/decompression microdiscectomy  12/21/2011    Procedure: LUMBAR LAMINECTOMY/DECOMPRESSION MICRODISCECTOMY 1 LEVEL;  Surgeon: Temple PaciniHenry A Pool, MD;  Location: [facility];  Service: Neurosurgery;  Laterality: Bilateral;  Lumbar four-five decompressive lumbar laminectomy; Right Lumbar four-five microdiscectomy  . Eye surgery    . Spine surgery    . Abdominal hysterectomy  1986  Family History  Problem Relation Age of Onset  . Anesthesia problems Sister   . Anesthesia problems Son   . Thyroid cancer Son   . Heart disease Mother   . Diabetes Sister    Social History:  reports that she has quit smoking. Her smoking use included Cigarettes. She smoked 0.00 packs per day. Her smokeless tobacco use includes Snuff. She reports that she does not drink alcohol or use illicit drugs.  Allergies:  Allergies  Allergen Reactions  . Morphine And Related Other (See Comments)    Makes her crazy  . Enalapril Swelling    Facial swelling    Medications:                                                                                                                           I have reviewed the patient's current medications.  ROS:                                                                                                                                       History obtained from EMS and the patient  General ROS: negative for - chills, fatigue, fever, night sweats, or weight loss Psychological ROS: negative for - behavioral disorder, hallucinations, memory difficulties, mood swings or suicidal ideation Ophthalmic ROS: negative for - blurry vision, double vision, eye pain or loss of vision ENT ROS: negative for - epistaxis, nasal discharge, oral lesions, sore  throat, tinnitus or vertigo Allergy and Immunology ROS: negative for - hives or itchy/watery eyes Hematological and Lymphatic ROS: negative for - bleeding problems, bruising or swollen lymph nodes Endocrine ROS: negative for - galactorrhea, hair pattern changes, polydipsia/polyuria or temperature intolerance Respiratory ROS: negative for - cough, hemoptysis, shortness of breath or wheezing Cardiovascular ROS: negative for - chest pain, dyspnea on exertion, edema or irregular heartbeat Gastrointestinal ROS: negative for - abdominal pain, diarrhea, hematemesis, nausea/vomiting or stool incontinence Genito-Urinary ROS: negative for - dysuria, hematuria, incontinence or urinary frequency/urgency Musculoskeletal ROS: negative for - joint swelling Neurological ROS: as noted in HPI Dermatological ROS: negative for rash and skin lesion changes  Physical exam: pleasant female in no apparent distress. BP 130/60 P 76 R 17 afebrile. SpO2 100.00%.  Head: normocephalic. Neck: supple, no bruits, no JVD. Cardiac: no murmurs. Lungs: clear. Abdomen: soft, no tender, no mass. Extremities: no edema. Neurologic Examination:  General: Mental Status: Alert, oriented, thought content appropriate.  Speech fluent without evidence of aphasia.  Able to follow 3 step commands without difficulty. Cranial Nerves: II: Discs flat bilaterally; Visual fields grossly normal, pupils equal, round, reactive to light and accommodation III,IV, VI: ptosis not present, extra-ocular motions intact bilaterally V,VII: smile symmetric, facial light touch sensation normal bilaterally VIII: hearing normal bilaterally IX,X: gag reflex present XI: bilateral shoulder shrug XII: midline tongue extension without atrophy or fasciculations Motor: Drift left leg Tone and bulk:normal tone throughout; no atrophy noted Sensory: Pinprick and  light touch intact throughout, bilaterally Deep Tendon Reflexes:  Right: Upper Extremity   Left: Upper extremity   biceps (C-5 to C-6) 2/4   biceps (C-5 to C-6) 2/4 tricep (C7) 2/4    triceps (C7) 2/4 Brachioradialis (C6) 2/4  Brachioradialis (C6) 2/4  Lower Extremity Lower Extremity  quadriceps (L-2 to L-4) 2/4   quadriceps (L-2 to L-4) 2/4 Achilles (S1) 2/4   Achilles (S1) 2/4  Plantars: Right: downgoing   Left: downgoing Cerebellar: normal finger-to-nose,  Impaired HKS in the left Gait:  No tested    Results for orders placed during the hospital encounter of 05/29/14 (from the past 48 hour(s))  CBC     Status: None   Collection Time    05/29/14  1:20 AM      Result Value Ref Range   WBC 7.2  4.0 - 10.5 K/uL   RBC 4.37  3.87 - 5.11 MIL/uL   Hemoglobin 13.0  12.0 - 15.0 g/dL   HCT 16.1  09.6 - 04.5 %   MCV 87.4  78.0 - 100.0 fL   MCH 29.7  26.0 - 34.0 pg   MCHC 34.0  30.0 - 36.0 g/dL   RDW 40.9  81.1 - 91.4 %   Platelets 178  150 - 400 K/uL  DIFFERENTIAL     Status: None   Collection Time    05/29/14  1:20 AM      Result Value Ref Range   Neutrophils Relative % 71  43 - 77 %   Neutro Abs 5.2  1.7 - 7.7 K/uL   Lymphocytes Relative 18  12 - 46 %   Lymphs Abs 1.3  0.7 - 4.0 K/uL   Monocytes Relative 7  3 - 12 %   Monocytes Absolute 0.5  0.1 - 1.0 K/uL   Eosinophils Relative 4  0 - 5 %   Eosinophils Absolute 0.3  0.0 - 0.7 K/uL   Basophils Relative 0  0 - 1 %   Basophils Absolute 0.0  0.0 - 0.1 K/uL   No results found.   Assessment: 79 y.o. female brought in as a code stroke due to increase left sided weakness and gait difficulty. NIHSS 2, thus IV thrombolysis was not administered. CT brain without acute abnormality. Concern for right subcortical infarct. Admit to medicine and complete stroke work up. Aspirin. Stroke team to follow in am.  Stroke Risk Factors - age, HTN, DM, CAD, congestive heart failure, prior stroke.  Plan: 1. HgbA1c, fasting lipid  panel 2. MRI, MRA  of the brain without contrast 3. Echocardiogram 4. Carotid dopplers 5. Prophylactic therapy-aspirin after passing swallowing eval 6. Risk factor modification 7. Telemetry monitoring 8. Frequent neuro checks 9. PT/OT SLP  Wyatt Portela, MD [facility] [phone]  05/29/2014, 1:38 AM    Addendum: UA confirms UTI. Can not exclude worsening of patient's residual left sided weakness in the context of UTI. Hence, will recommend obtaining MRI/MRA  brain and pursue further stroke work up only if MRI positive for stroke.  Georga Hackingsvaldo Camilo,MD

## 2014-05-29 NOTE — ED Notes (Signed)
Report given to rn on 3  w 

## 2014-05-29 NOTE — Progress Notes (Signed)
PT Cancellation Note  Patient Details Name: Michela PitcherFrances L Frankland MRN: [id number] DOB: 09/19/1934   Cancelled Treatment:    Reason Eval/Treat Not Completed: Patient not medically ready (conflicting orders (PT/OT vestibular order for 10/10, PT order to start on 10/11; patient still on bedrest)  MD - please clarify activity orders.  Thank you,   Olivia CanterMoton, Daphene Chisholm M, South CarolinaPT [phone] 05/29/2014, 2:08 PM

## 2014-05-29 NOTE — ED Notes (Signed)
The pt lives alone  And earlier tonight she got up from her chair and felt too weak to walk without assistance.  She also has a headache.  She thinks that the lights are too bright causing her headache.  .  No n or v.  She has had some painful urination and she has redness surrounding her  Genitalia .  She lives alone and she rang her life line to get ems to arrive

## 2014-05-29 NOTE — Code Documentation (Signed)
79 yo wf brought in via [facility] after awakening with more pronounced Lt side weakness & gait disturbances.  Per report LKW 1930.  Pt then took a shower & went to bed.  She later awoke around 0030 and discovered the weakness to be much worse & called for her son.  At this time her gait was noticed to be altered.  NIH 2 on eval in [facility] for Lt arm weakness & ataxia.  See doc flowsheets for code stroke times.

## 2014-05-29 NOTE — Progress Notes (Addendum)
   [facility]                                                                              Patient Demographics  Abigail Brooks, is a 79 y.o. female, DOB - 10/15/1934, [id number]  Admit date - 05/29/2014   Admitting Physician Lorretta HarpXilin Niu, MD  Outpatient Primary MD for the patient is [name], MAHIMA, MD  LOS - 0   Chief Complaint  Patient presents with  . Fall  . Code Stroke      HPI on 05/29/2014 by Dr. Lorretta HarpXilin Niu Abigail Brooks is a 79 y.o. female with PMH of DM, HTN, Dementia, CAD, CHF, who presents with weakness and dysuria. Patient was last seen normal at 4:30 PM when her son visited her at home. At 12:00Pm, her son was called by patient's Life Alert. He then went to patient's home to check on her. The patient was found to be on the sofa and is very weak. Patient could not get up from sofa. It is not sure whether patient was weak or just weak in one side of her body. She was brought to ED. When the patient was evaluated in ED, she felt better and her weakness improved.  Patient also reported that she has recurrent UTI, almost every 2 weeks. Currently she has dysuria, burning on urination and increased urinary frequency. Urinalysis was positive for UTI. Patient has mild nausea, but no fever, chills, vomiting, diarrhea, chest pain, shortness of breath, rashes  CT-head showed no acute abnormalities. She was admitted to the inpatient for further evaluation and treatment.   Assessment & Plan   Patient was admitted early this morning.  Agree with current assessment and plan by Dr. Lorretta HarpXilin Niu.  Left-sided weakness and gait difficulty/? CVA -CT head: No acute intracranial pathology seen on CT. -Echocardiogram 02/22/2014 showed an EF of 55-60%, no defect or patent foramen ovale -Carotid Doppler 02/22/2014: 1-39% ICA stenosis. Vertebral artery flow is antegrade. -Neurology consulted and appreciated  -Pending hemoglobin A1c, lipid panel -PT, OT, speech consult -Continue aspirin,  Plavix, statin  Chronic Lumbar Stenosis with neurogenic claudication -Continue narcotics  Diabetes mellitus, type II -Metformin held -Continue insulin sliding scale with CBG monitoring, will check hemoglobin A1c  Coronary Artery Disease -Continue aspirin and metoprolol -Patient is chest pain free  Dementia -Stable, continue seroquel  UTI -Continue ceftriaxone -Urine culture pending -May need urology as an outpatient  Code Status: Full  Family Communication: Son at bedside  Disposition Plan: Admitted  Time Spent in minutes   30 minutes  Procedures  None  Consults   Neurology  DVT Prophylaxis  Heparin  Lennex Pietila D.O. on 05/29/2014 at 7:44 AM  Between 7am to 7pm - Pager - [phone]  After 7pm go to [email or web address] - password [email or web address]  And look for the night coverage person covering for me after hours  [facility] Office  [phone]

## 2014-05-29 NOTE — ED Notes (Signed)
Transporting patient to new room assignment. 

## 2014-05-29 NOTE — ED Provider Notes (Signed)
CSN: [id number]     Arrival date & time 05/29/14  0116 History   First MD Initiated Contact with Patient 05/29/14 0124     Chief Complaint  Patient presents with  . Fall  . Code Stroke    An emergency department physician performed an initial assessment on this suspected stroke patient at 0116. (Consider location/radiation/quality/duration/timing/severity/associated sxs/prior Treatment) HPI 79 yo female presents to the ER from home via EMS as a code stroke.  Pt was at her baseline today, family at her house and left around 730 pm.  They were called back to her house by her life alert around midnight.  Pt reports she went to bed early, then woke hungry and made herself a sandwich.  She fell asleep again on the couch on her left side with her leg propped up.  When she woke again she was unable to get up from the couch.  Pt has h/o CVA with residual left sided weakness.  She uses a walker.  Pt lives by herself.  Son was able to help her off the couch but she became very weak as they tried to get to her bedroom, and son assisted her down to the floor as she fell.  No head trauma, no LOC.  Pt has history of UTI, reports last antibiotics about 2 months ago.  No fevers, no urinary symptoms.  Left sided weakness about at her baseline.  She denies any visual changes, new weakness or numbness.  No vertigo.  No fevers, chills, n/v/d Past Medical History  Diagnosis Date  . Coronary artery disease     stents x 2, sees [facility]. [facility]  . Congestive heart failure     2012  . Myocardial infarction     "unsure of when"  . Hypertension   . Diabetes mellitus   . Shortness of breath   . Anemia   . Blood transfusion     when had hysterectomy  . Hypothyroidism     has had hx of treatment  . Urinary frequency   . Urinary tract infection     hx of  . GERD (gastroesophageal reflux disease)   . H/O hiatal hernia   . Arthritis   . Depression   . Spinal stenosis     with bone spurs and ruptured  disc  . Cataract   . Blood transfusion without reported diagnosis   . Complications affecting other specified body systems, hypertension   . Other malaise and fatigue   . Dysuria   . Unspecified vitamin D deficiency   . Anxiety state, unspecified   . Other B-complex deficiencies   . Abnormality of gait   . Other and unspecified hyperlipidemia    Past Surgical History  Procedure Laterality Date  . Cataract extraction w/ intraocular lens  implant, bilateral    . Bladder suspension    . Coronary      stents x 2, stent implant placed9/21/06  . Back surgery      ruptured disc, lumbar area  . Appendectomy    . Cardiac catheterization    . Lumbar laminectomy/decompression microdiscectomy  12/21/2011    Procedure: LUMBAR LAMINECTOMY/DECOMPRESSION MICRODISCECTOMY 1 LEVEL;  Surgeon: Temple Pacini, MD;  Location: [facility];  Service: Neurosurgery;  Laterality: Bilateral;  Lumbar four-five decompressive lumbar laminectomy; Right Lumbar four-five microdiscectomy  . Eye surgery    . Spine surgery    . Abdominal hysterectomy  1986   Family History  Problem Relation Age of Onset  .  Anesthesia problems Sister   . Anesthesia problems Son   . Thyroid cancer Son   . Heart disease Mother   . Diabetes Sister    History  Substance Use Topics  . Smoking status: Former Smoker    Types: Cigarettes  . Smokeless tobacco: Current User    Types: Snuff     Comment: 40 years ago  . Alcohol Use: No   OB History   Grav Para Term Preterm Abortions TAB SAB Ect Mult Living                 Review of Systems  All other systems reviewed and are negative.     Allergies  Morphine and related and Enalapril  Home Medications   Prior to Admission medications   Medication Sig Start Date End Date Taking? Authorizing Provider  atorvastatin (LIPITOR) 10 MG tablet Take 1 tablet (10 mg total) by mouth daily at 6 PM. 04/21/14  Yes Mahima Pandey, MD  Calcium Carbonate-Vitamin D (CALCIUM + D PO) Take 1  tablet by mouth daily. Calcium 315 mg,Vitamin D3 250 I.U.   Yes Historical Provider, MD  clonazePAM (KLONOPIN) 0.5 MG tablet Take 1 tablet (0.5 mg total) by mouth 2 (two) times daily. 04/13/14  Yes Oneal Grout, MD  clopidogrel (PLAVIX) 75 MG tablet Take 1 tablet (75 mg total) by mouth daily. 04/21/14  Yes Mahima Glade Lloyd, MD  diclofenac sodium (VOLTAREN) 1 % GEL Apply 4 g topically 2 (two) times daily as needed (for knee pain).   Yes Historical Provider, MD  fenofibrate 54 MG tablet Take 54 mg by mouth daily.   Yes Historical Provider, MD  ferrous sulfate 325 (65 FE) MG tablet Take 325 mg by mouth daily with breakfast.   Yes Historical Provider, MD  fesoterodine (TOVIAZ) 4 MG TB24 tablet Take 4 mg by mouth daily.   Yes Historical Provider, MD  fish oil-omega-3 fatty acids 1000 MG capsule Take 1 g by mouth daily.    Yes Historical Provider, MD  hydrALAZINE (APRESOLINE) 25 MG tablet Take 25 mg by mouth 3 (three) times daily.   Yes Historical Provider, MD  losartan-hydrochlorothiazide (HYZAAR) 50-12.5 MG per tablet Take 1 tablet by mouth daily.   Yes Historical Provider, MD  metFORMIN (GLUCOPHAGE) 500 MG tablet Take 1 tablet (500 mg total) by mouth daily with breakfast. 11/04/13  Yes Mahima Pandey, MD  metoprolol (LOPRESSOR) 100 MG tablet Take 1 tablet (100 mg total) by mouth 2 (two) times daily. 12/14/13  Yes Tiffany L Reed, DO  nitroGLYCERIN (NITROSTAT) 0.4 MG SL tablet Place 0.4 mg under the tongue every 5 (five) minutes as needed for chest pain. Max 3 tablets in 15 minutes   Yes Historical Provider, MD  oxyCODONE-acetaminophen (PERCOCET/ROXICET) 5-325 MG per tablet Take 1-2 tablets by mouth every 8 (eight) hours as needed for severe pain (pain). 05/05/14  Yes Mahima Glade Lloyd, MD  phenazopyridine (PYRIDIUM) 100 MG tablet Take 1 tablet (100 mg total) by mouth 3 (three) times daily as needed for pain. 04/13/14  Yes Mahima Glade Lloyd, MD  polyethylene glycol (MIRALAX / GLYCOLAX) packet Take 17 g by mouth daily as  needed (pain).   Yes Historical Provider, MD  QUEtiapine (SEROQUEL) 25 MG tablet Take 25-50 mg by mouth 2 (two) times daily. Take 1 tablet every morning and take 2 tablets every evening   Yes Historical Provider, MD  senna-docusate (SENNA S) 8.6-50 MG per tablet Take 1 tablet by mouth 2 (two) times daily.   Yes Historical Provider, MD  zoster vaccine live, PF, (ZOSTAVAX) [id number] UNT/0.65ML injection Inject 19,400 Units into the skin once. 05/05/14  Yes Mahima Pandey, MD   BP 164/63  Pulse 60  Temp(Src) 99 F (37.2 C) (Oral)  Resp 18  Ht 5' (1.524 m)  Wt 199 lb (90.266 kg)  BMI 38.86 kg/m2  SpO2 99% Physical Exam  Nursing note and vitals reviewed. Constitutional: She is oriented to person, place, and time. She appears well-developed and well-nourished.  HENT:  Head: Normocephalic and atraumatic.  Right Ear: External ear normal.  Left Ear: External ear normal.  Nose: Nose normal.  Mouth/Throat: Oropharynx is clear and moist.  Eyes: Conjunctivae and EOM are normal. Pupils are equal, round, and reactive to light.  Neck: Normal range of motion. Neck supple. No JVD present. No tracheal deviation present. No thyromegaly present.  Cardiovascular: Normal rate, regular rhythm, normal heart sounds and intact distal pulses.  Exam reveals no gallop and no friction rub.   No murmur heard. Pulmonary/Chest: Effort normal and breath sounds normal. No stridor. No respiratory distress. She has no wheezes. She has no rales. She exhibits no tenderness.  Abdominal: Soft. Bowel sounds are normal. She exhibits no distension and no mass. There is no tenderness. There is no rebound and no guarding.  Musculoskeletal: Normal range of motion. She exhibits no edema and no tenderness.  Lymphadenopathy:    She has no cervical adenopathy.  Neurological: She is alert and oriented to person, place, and time. She displays normal reflexes. No cranial nerve deficit. She exhibits normal muscle tone. Coordination normal.   Weakness on left 4/5 ue and le, pt reports is her baseline  Skin: Skin is warm and dry. No rash noted. No erythema. No pallor.  Psychiatric: She has a normal mood and affect. Her behavior is normal. Judgment and thought content normal.    ED Course  Procedures (including critical care time) Labs Review Labs Reviewed  COMPREHENSIVE METABOLIC PANEL - Abnormal; Notable for the following:    Glucose, Bld 239 (*)    BUN 25 (*)    Creatinine, Ser 1.29 (*)    GFR calc non Af Amer 39 (*)    GFR calc Af Amer 45 (*)    All other components within normal limits  URINALYSIS, ROUTINE W REFLEX MICROSCOPIC - Abnormal; Notable for the following:    Color, Urine ORANGE (*)    APPearance CLOUDY (*)    Hgb urine dipstick TRACE (*)    Ketones, ur 15 (*)    Nitrite POSITIVE (*)    Leukocytes, UA LARGE (*)    All other components within normal limits  URINE MICROSCOPIC-ADD ON - Abnormal; Notable for the following:    Bacteria, UA MANY (*)    Casts HYALINE CASTS (*)    Crystals CA OXALATE CRYSTALS (*)    All other components within normal limits  GLUCOSE, CAPILLARY - Abnormal; Notable for the following:    Glucose-Capillary 177 (*)    All other components within normal limits  CBG MONITORING, ED - Abnormal; Notable for the following:    Glucose-Capillary 219 (*)    All other components within normal limits  I-STAT CHEM 8, ED - Abnormal; Notable for the following:    BUN 26 (*)    Creatinine, Ser 1.40 (*)    Glucose, Bld 238 (*)    All other components within normal limits  URINE CULTURE  PROTIME-INR  APTT  CBC  DIFFERENTIAL  HEMOGLOBIN A1C  I-STAT TROPOININ, ED    Imaging  Review Ct Head (brain) Wo Contrast  05/29/2014   CLINICAL DATA:  Code stroke.  Left-sided weakness.  EXAM: CT HEAD WITHOUT CONTRAST  TECHNIQUE: Contiguous axial images were obtained from the base of the skull through the vertex without intravenous contrast.  COMPARISON:  CT of the head performed 04/02/2014  FINDINGS:  There is no evidence of acute infarction, mass lesion, or intra- or extra-axial hemorrhage on CT.  Mild periventricular white matter change likely reflects small vessel ischemic microangiopathy. Prominence of the ventricles and sulci suggests mild cortical volume loss. Mild cerebellar atrophy is noted.  The brainstem and fourth ventricle are within normal limits. The basal ganglia are unremarkable in appearance. The cerebral hemispheres demonstrate grossly normal gray-white differentiation. No mass effect or midline shift is seen.  There is no evidence of fracture; visualized osseous structures are unremarkable in appearance. The orbits are within normal limits. The paranasal sinuses and mastoid air cells are well-aerated. No significant soft tissue abnormalities are seen.  IMPRESSION: 1. No acute intracranial pathology seen on CT. 2. Mild cortical volume loss and scattered small vessel ischemic microangiopathy.  These results were called by telephone at the time of interpretation on 05/29/2014 at 1:44 am to Dr. Marisa SeverinLGA Glennette Galster , who verbally acknowledged these results.   Electronically Signed   By: Roanna RaiderJeffery  Chang M.D.   On: 05/29/2014 01:45     EKG Interpretation None      MDM   Final diagnoses:  Acute cystitis without hematuria  Left-sided weakness    79 yo female with complaint of acute on chronic left sided weakness since falling asleep on the couch this evening, fall.  Pt initially code stroke, not TPA candidate.  Pt with UTI, may be cause of weakness, has prior stroke with left sided weakness.  Pt with difficulty walking, lives alone.  To be admitted to hospitalist service.  Neurology recommends MRI, if negative does not need further stroke workup.      Olivia Mackielga M Fiana Gladu, MD 05/30/14 [phone]

## 2014-05-29 NOTE — H&P (Signed)
[facility] History and Physical  Abigail Brooks ZOX:[id number] DOB: June 23, 1935 DOA: 05/29/2014  Referring physician: ED physician PCP: [name], MAHIMA, MD  Specialists:   Chief Complaint: Weakness and dysuria  HPI: Abigail PitcherFrances L Mothershead is a 79 y.o. female  with PMH of DM, HTN, Dementia, CAD, CHF, who presents with weakness and dysuria.  Patient was noticed in her normal condition at 4:30 PM when his son visited her at home. At 12:00Pm, his son was called by patient's Life Alert. He then went to patient's home to check on her. The patient was found to be in sofa and is very weak. Patient could not get up from sofa. It is not sure whether patient is generalized weak or just week in one side of her body. She was brought to ED. When I evaluated patient in ED, she feels better and her weakness improved.   Patient also reports that she has recurrent UTI, almost every 2 weeks. Currently she has dysuria, burning on urination and increased urinary frequency. Urinalysis is positive for UTI. Patient has mild nausea, but no fever, chills, vomiting, diarrhea, chest pain, shortness of breath, rashes  CT-head showed no acute abnormalities. She is admitted to the inpatient for further evaluation and treatment.  Review of Systems: As presented in the history of presenting illness, rest negative.  Where does patient live? Lives at home alone Can patient participate in ADLs? partically  Allergy:  Allergies  Allergen Reactions  . Morphine And Related Other (See Comments)    Makes her crazy  . Enalapril Swelling    Facial swelling    Past Medical History  Diagnosis Date  . Coronary artery disease     stents x 2, sees Dr. [facility]ina [facility], [facility]  . Congestive heart failure     2012  . Myocardial infarction     "unsure of when"  . Hypertension   . Diabetes mellitus   . Shortness of breath   . Anemia   . Blood transfusion     when had hysterectomy  . Hypothyroidism     has had hx of  treatment  . Urinary frequency   . Urinary tract infection     hx of  . GERD (gastroesophageal reflux disease)   . H/O hiatal hernia   . Arthritis   . Depression   . Spinal stenosis     with bone spurs and ruptured disc  . Cataract   . Blood transfusion without reported diagnosis   . Complications affecting other specified body systems, hypertension   . Other malaise and fatigue   . Dysuria   . Unspecified vitamin D deficiency   . Anxiety state, unspecified   . Other B-complex deficiencies   . Abnormality of gait   . Other and unspecified hyperlipidemia     Past Surgical History  Procedure Laterality Date  . Cataract extraction w/ intraocular lens  implant, bilateral    . Bladder suspension    . Coronary      stents x 2, stent implant placed9/21/06  . Back surgery      ruptured disc, lumbar area  . Appendectomy    . Cardiac catheterization    . Lumbar laminectomy/decompression microdiscectomy  12/21/2011    Procedure: LUMBAR LAMINECTOMY/DECOMPRESSION MICRODISCECTOMY 1 LEVEL;  Surgeon: Temple PaciniHenry A Pool, MD;  Location: [facility];  Service: Neurosurgery;  Laterality: Bilateral;  Lumbar four-five decompressive lumbar laminectomy; Right Lumbar four-five microdiscectomy  . Eye surgery    . Spine surgery    .  Abdominal hysterectomy  1986    Social History:  reports that she has quit smoking. Her smoking use included Cigarettes. She smoked 0.00 packs per day. Her smokeless tobacco use includes Snuff. She reports that she does not drink alcohol or use illicit drugs.  Family History:  Family History  Problem Relation Age of Onset  . Anesthesia problems Sister   . Anesthesia problems Son   . Thyroid cancer Son   . Heart disease Mother   . Diabetes Sister      Prior to Admission medications   Medication Sig Start Date End Date Taking? Authorizing Provider  atorvastatin (LIPITOR) 10 MG tablet Take 1 tablet (10 mg total) by mouth daily at 6 PM. 04/21/14  Yes Mahima Pandey, MD   Calcium Carbonate-Vitamin D (CALCIUM + D PO) Take 1 tablet by mouth daily. Calcium 315 mg,Vitamin D3 250 I.U.   Yes Historical Provider, MD  clonazePAM (KLONOPIN) 0.5 MG tablet Take 1 tablet (0.5 mg total) by mouth 2 (two) times daily. 04/13/14  Yes Oneal GroutMahima Pandey, MD  clopidogrel (PLAVIX) 75 MG tablet Take 1 tablet (75 mg total) by mouth daily. 04/21/14  Yes Mahima Glade LloydPandey, MD  diclofenac sodium (VOLTAREN) 1 % GEL Apply 4 g topically 2 (two) times daily as needed (for knee pain).   Yes Historical Provider, MD  fenofibrate 54 MG tablet Take 54 mg by mouth daily.   Yes Historical Provider, MD  ferrous sulfate 325 (65 FE) MG tablet Take 325 mg by mouth daily with breakfast.   Yes Historical Provider, MD  fesoterodine (TOVIAZ) 4 MG TB24 tablet Take 4 mg by mouth daily.   Yes Historical Provider, MD  fish oil-omega-3 fatty acids 1000 MG capsule Take 1 g by mouth daily.    Yes Historical Provider, MD  hydrALAZINE (APRESOLINE) 25 MG tablet Take 25 mg by mouth 3 (three) times daily.   Yes Historical Provider, MD  losartan-hydrochlorothiazide (HYZAAR) 50-12.5 MG per tablet Take 1 tablet by mouth daily.   Yes Historical Provider, MD  metFORMIN (GLUCOPHAGE) 500 MG tablet Take 1 tablet (500 mg total) by mouth daily with breakfast. 11/04/13  Yes Mahima Pandey, MD  metoprolol (LOPRESSOR) 100 MG tablet Take 1 tablet (100 mg total) by mouth 2 (two) times daily. 12/14/13  Yes Tiffany L Reed, DO  nitroGLYCERIN (NITROSTAT) 0.4 MG SL tablet Place 0.4 mg under the tongue every 5 (five) minutes as needed for chest pain. Max 3 tablets in 15 minutes   Yes Historical Provider, MD  oxyCODONE-acetaminophen (PERCOCET/ROXICET) 5-325 MG per tablet Take 1-2 tablets by mouth every 8 (eight) hours as needed for severe pain (pain). 05/05/14  Yes Mahima Glade LloydPandey, MD  phenazopyridine (PYRIDIUM) 100 MG tablet Take 1 tablet (100 mg total) by mouth 3 (three) times daily as needed for pain. 04/13/14  Yes Mahima Glade LloydPandey, MD  polyethylene glycol  (MIRALAX / GLYCOLAX) packet Take 17 g by mouth daily as needed (pain).   Yes Historical Provider, MD  QUEtiapine (SEROQUEL) 25 MG tablet Take 25-50 mg by mouth 2 (two) times daily. Take 1 tablet every morning and take 2 tablets every evening   Yes Historical Provider, MD  senna-docusate (SENNA S) 8.6-50 MG per tablet Take 1 tablet by mouth 2 (two) times daily.   Yes Historical Provider, MD  zoster vaccine live, PF, (ZOSTAVAX) [id number] UNT/0.65ML injection Inject 19,400 Units into the skin once. 05/05/14  Yes Oneal GroutMahima Pandey, MD    Physical Exam: Filed Vitals:   05/29/14 0430 05/29/14 0450 05/29/14 0515 05/29/14  0549  BP: 161/57 161/57 164/69 164/69  Pulse: 58 60 57 61  Temp:      TempSrc:      Resp: 19 26 21 23   Height:      Weight:      SpO2: 93% 94% 92% 93%   General: Not in acute distress HEENT:       Eyes: PERRL, EOMI, no scleral icterus       ENT: No discharge from the ears and nose, no pharynx injection, no tonsillar enlargement.        Neck: No JVD, no bruit, no mass felt. Cardiac: S1/S2, RRR, No murmurs, gallops or rubs Pulm: Good air movement bilaterally. Clear to auscultation bilaterally. No rales, wheezing, rhonchi or rubs. Abd: Soft, nondistended, nontender, no rebound pain, no organomegaly, BS present Ext: No edema. 2+DP/PT pulse bilaterally Musculoskeletal: No joint deformities, erythema, or stiffness, ROM full Skin: No rashes.  Neuro: Alert and oriented X3, cranial nerves II-XII grossly intact, muscle strength 4/5 in left arm and leg, 5/5 on the right side. sensation to light touch intact. Brachial reflex 2+ bilaterally. Knee reflex 1+ bilaterally. Negative Babinski's sign. Normal finger to nose test. Psych: Patient is not psychotic, no suicidal or hemocidal ideation.  Labs on Admission:  Basic Metabolic Panel:  Recent Labs Lab 05/29/14 0120 05/29/14 0129  NA 140 141  K 4.1 3.7  CL 101 103  CO2 24  --   GLUCOSE 239* 238*  BUN 25* 26*  CREATININE 1.29* 1.40*   CALCIUM 9.6  --    Liver Function Tests:  Recent Labs Lab 05/29/14 0120  AST 14  ALT 12  ALKPHOS 64  BILITOT 0.3  PROT 7.1  ALBUMIN 3.5   No results found for this basename: LIPASE, AMYLASE,  in the last 168 hours No results found for this basename: AMMONIA,  in the last 168 hours CBC:  Recent Labs Lab 05/29/14 0120 05/29/14 0129  WBC 7.2  --   NEUTROABS 5.2  --   HGB 13.0 13.6  HCT 38.2 40.0  MCV 87.4  --   PLT 178  --    Cardiac Enzymes: No results found for this basename: CKTOTAL, CKMB, CKMBINDEX, TROPONINI,  in the last 168 hours  BNP (last 3 results) No results found for this basename: PROBNP,  in the last 8760 hours CBG:  Recent Labs Lab 05/29/14 0143  GLUCAP 219*    Radiological Exams on Admission: Ct Head (brain) Wo Contrast  05/29/2014   CLINICAL DATA:  Code stroke.  Left-sided weakness.  EXAM: CT HEAD WITHOUT CONTRAST  TECHNIQUE: Contiguous axial images were obtained from the base of the skull through the vertex without intravenous contrast.  COMPARISON:  CT of the head performed 04/02/2014  FINDINGS: There is no evidence of acute infarction, mass lesion, or intra- or extra-axial hemorrhage on CT.  Mild periventricular white matter change likely reflects small vessel ischemic microangiopathy. Prominence of the ventricles and sulci suggests mild cortical volume loss. Mild cerebellar atrophy is noted.  The brainstem and fourth ventricle are within normal limits. The basal ganglia are unremarkable in appearance. The cerebral hemispheres demonstrate grossly normal gray-white differentiation. No mass effect or midline shift is seen.  There is no evidence of fracture; visualized osseous structures are unremarkable in appearance. The orbits are within normal limits. The paranasal sinuses and mastoid air cells are well-aerated. No significant soft tissue abnormalities are seen.  IMPRESSION: 1. No acute intracranial pathology seen on CT. 2. Mild cortical volume loss  and scattered small vessel ischemic microangiopathy.  These results were called by telephone at the time of interpretation on 05/29/2014 at 1:44 am to Dr. Marisa Severin , who verbally acknowledged these results.   Electronically Signed   By: Roanna Raider M.D.   On: 05/29/2014 01:45    EKG: Independently reviewed.   Assessment/Plan Principal Problem:   Stroke Active Problems:   Lumbar stenosis with neurogenic claudication   Chronic diastolic CHF (congestive heart failure)   Essential hypertension, benign   UTI (urinary tract infection)   Type 2 diabetes mellitus with renal manifestations, controlled  # Stroke: It is not very sure whether patient's weakness is unilateral or bilateral. Patient has UTI which may also have contributed to her weakness. CT head unremarkable. Neurology was consulted, Dr. Leroy Kennedy evaluated the patient, and suggested to get MRI/MRA. If MRI is positive for new stroke, we'll do full stroke workup, otherwise we'll continue the current treatment.  - will admit to tele bed - get MRI/MRA as dr. Leroy Kennedy suggested - continue home plavix and add ASA - HgbA1c, fasting lipid panel  - PT consult, OT consult, Speech consult  - continue Lipitor, fish oil and fenofibrate for HLD  #: Lumbar stenosis with neurogenic claudication -continue home dose narcotics  # Type II diabetes mellitus  -hold metformin, SSI    CAD: no chest pain now  -continue ASA/metoprolol  # Dementia with behavioral disturbance -stable, continue home dose seroquel  # UTI: - IV ceftriaxone - FU Urine CX  #: CHF: 2-D echo on 02/22/14 showed EF of 55%. Patient is clinically euvolemic. - continue  Hyzaar and metoprol   DVT ppx: SQ Heparin     Code Status: Full code Family Communication: Yes, patient's son at bed side Disposition Plan: Admit to inpatient   Date of Service 05/29/2014    Lorretta Harp [facility] Pager [phone]  If 7PM-7AM, please contact  night-coverage [email or web address] Password [email or web address] 05/29/2014, 6:03 AM

## 2014-05-29 NOTE — ED Notes (Signed)
CBG taken = 219

## 2014-05-29 NOTE — Evaluation (Signed)
{  EVAL NOTES:3SLP Cancellation Note  Patient Details Name: Michela PitcherFrances L Forman MRN: [id number] DOB: Mar 25, 1935   Cancelled treatment:       Reason Eval/Treat Not Completed:  (order for SLE for 10/11 received, thank you)   Donavan Burnetamara Gladine Plude, MS [facility] [phone]

## 2014-05-29 NOTE — ED Notes (Signed)
Admitting doctor at the bedside 

## 2014-05-29 NOTE — ED Notes (Signed)
Per EMS: pt coming from home with c/o fall, left sided weakness, LSN 1930. Pt and son reported weakness on left side after taking a shower. Son stated to medics, pt became weak and son had to lower pt to ground. No LOC, denies hitting head. No altered mental status noted, no speech deficits noted. Pt started started taking Plavix three months ago. Pt Pt A&Ox4, respirations equal and unlabored, skin warm and dry

## 2014-05-29 NOTE — ED Notes (Signed)
The pt passed her swallow screen 

## 2014-05-29 NOTE — ED Notes (Signed)
Pt sitting up drinking a cup of coffee

## 2014-05-30 ENCOUNTER — Inpatient Hospital Stay (HOSPITAL_COMMUNITY): Payer: Medicare Other

## 2014-05-30 DIAGNOSIS — E1129 Type 2 diabetes mellitus with other diabetic kidney complication: Secondary | ICD-10-CM

## 2014-05-30 DIAGNOSIS — I639 Cerebral infarction, unspecified: Secondary | ICD-10-CM

## 2014-05-30 DIAGNOSIS — N390 Urinary tract infection, site not specified: Secondary | ICD-10-CM

## 2014-05-30 DIAGNOSIS — N3281 Overactive bladder: Secondary | ICD-10-CM

## 2014-05-30 DIAGNOSIS — N058 Unspecified nephritic syndrome with other morphologic changes: Secondary | ICD-10-CM

## 2014-05-30 DIAGNOSIS — M4806 Spinal stenosis, lumbar region: Secondary | ICD-10-CM

## 2014-05-30 LAB — LIPID PANEL
Cholesterol: 150 mg/dL (ref 0–200)
HDL: 35 mg/dL — ABNORMAL LOW (ref >39)
LDL Cholesterol: 56 mg/dL (ref 0–99)
Total CHOL/HDL Ratio: 4.3 RATIO
Triglycerides: 295 mg/dL — ABNORMAL HIGH (ref <150)
VLDL: 59 mg/dL — ABNORMAL HIGH (ref 0–40)

## 2014-05-30 LAB — GLUCOSE, CAPILLARY
GLUCOSE-CAPILLARY: 143 mg/dL — AB (ref 70–99)
Glucose-Capillary: 186 mg/dL — ABNORMAL HIGH (ref 70–99)

## 2014-05-30 MED ORDER — CIPROFLOXACIN HCL 250 MG PO TABS: 250.0000 mg | ORAL_TABLET | Freq: Two times a day (BID) | ORAL | Status: DC

## 2014-05-30 MED ORDER — ASPIRIN 81 MG PO TBEC
81.0000 mg | DELAYED_RELEASE_TABLET | Freq: Every day | ORAL | Status: DC
Start: 2014-05-30 — End: 2014-07-12

## 2014-05-30 NOTE — Discharge Summary (Signed)
Physician Discharge Summary  Abigail PitcherFrances L Brooks ZOX:[id number] DOB: Apr 09, 1935 DOA: 05/29/2014  PCP: Abigail GroutPANDEY, MAHIMA, MD  Admit date: 05/29/2014 Discharge date: 05/30/2014  Time spent: 45 minutes  Recommendations for Outpatient Follow-up:  Patient will be discharged home. She is to follow up with her primary care physician within one week of discharge is also to followup with Dr.Ahren, neurologist, within one week of discharge.  Patient should continue being her medications as prescribed. She should resume a carb modified/heart healthy diet. Patient should also followup with urology regarding recurrent urinary tract infections.  Discharge Diagnoses:  Acute on chronic right ACA ischemia Chronic lumbar stenosis with neurogenic claudication next Diabetes mellitus, type II Coronary artery disease Dementia Urinary tract infection Chronic kidney disease, stage III Hyperlipidemia Hypertension  Discharge Condition: Stable  Diet recommendation: Heart healthy/carb modified  Filed Weights   05/29/14 0138 05/30/14 0435  Weight: 90.266 kg (199 lb) 85.503 kg (188 lb 8 oz)    History of present illness:  on 05/29/2014 by Dr. Leontine LocketXilin Brooks  Abigail Brooks is a 79 y.o. female with PMH of DM, HTN, Dementia, CAD, CHF, who presents with weakness and dysuria. Patient was last seen normal at 4:30 PM when her son visited her at home. At 12:00Pm, her son was called by patient's Life Alert. He then went to patient's home to check on her. The patient was found to be on the sofa and is very weak. Patient could not get up from sofa. It is not sure whether patient was weak or just weak in one side of her body. She was brought to ED. When the patient was evaluated in ED, she felt better and her weakness improved. Patient also reported that she has recurrent UTI, almost every 2 weeks. Currently she has dysuria, burning on urination and increased urinary frequency. Urinalysis was positive for UTI. Patient has mild  nausea, but no fever, chills, vomiting, diarrhea, chest pain, shortness of breath, rashes CT-head showed no acute abnormalities. She was admitted to the inpatient for further evaluation and treatment.  Hospital Course:  Acute on chronic right ACA ischemia/ Left-sided weakness and gait difficulty -CT head: No acute intracranial pathology seen on CT.  -MRI/MRA brain: Acute on chronic right ACA territory ischemia. No mass effect or Hemorrhage. Occluded LEFT ACA in the A2 segment -Echocardiogram 02/22/2014 showed an EF of 55-60%, no defect or patent foramen ovale  -Carotid Doppler 02/22/2014: 1-39% ICA stenosis. Vertebral artery flow is antegrade. -Neurology consulted and appreciated and recommended no change in medications and outpatient followup in one week with Dr. Judieth KeensAhren -Hemoglobin A1c 6.9, LDL 56 -PT and OT evaluated patient, and she does not require further outpatient therapy  -Continue aspirin, Plavix, statin   Chronic Lumbar Stenosis with neurogenic claudication  -Continue narcotics   Diabetes mellitus, type II  -Metformin held during hospital course, may restart at discharge  -Was placed on insulin sliding scale with CBG monitoring during hospitalization -Hemoglobin A1c 6.9  Coronary Artery Disease  -Continue aspirin and metoprolol and Plavix -Patient is chest pain free   Dementia  -Stable, continue seroquel   Recurrent UTI/Overactive baldder  -Was placed on ceftriaxone during hospitalization -Urine culture: >100K GNR -Will discharge patient with cipro -May need urology as an outpatient -Continue pyridium and toviaz  Chronic kidney disease, stage 3 -Remains stable  Hyperlipidemia -Lipid profile: Total cholesterol 150, triglycerides 295 , LDL 56, HDL 35 -Continue fenofibrate at statin along with omega-3 fatty acids  Hypertension -Continue home medications  Procedures: None  Consultations:  Neurology  Discharge Exam: Filed Vitals:   05/30/14 0941  BP: 150/70    Pulse: 61  Temp:   Resp:      General: Well developed, well nourished, NAD, appears stated age  HEENT: NCAT, PERRLA, EOMI, Anicteic Sclera, mucous membranes moist.  Neck: Supple, no JVD, no masses  Cardiovascular: S1 S2 auscultated, no rubs, murmurs or gallops. Regular rate and rhythm.  Respiratory: Clear to auscultation bilaterally with equal chest rise  Abdomen: Soft, nontender, nondistended, + bowel sounds  Extremities: warm dry without cyanosis clubbing or edema  Neuro: AAOx3, cranial nerves grossly intact. Strength 5/5 in patient's upper and lower extremities bilaterally  Skin: Without rashes exudates or nodules  Psych: Normal affect and demeanor with intact judgement and insight  Discharge Instructions      Discharge Instructions   Driving Restrictions    Complete by:  As directed   Until cleared by neurology or primary care physician.     Increase activity slowly    Complete by:  As directed             Medication List         aspirin 81 MG EC tablet  Take 1 tablet (81 mg total) by mouth daily.     atorvastatin 10 MG tablet  Commonly known as:  LIPITOR  Take 1 tablet (10 mg total) by mouth daily at 6 PM.     CALCIUM + D PO  Take 1 tablet by mouth daily. Calcium 315 mg,Vitamin D3 250 I.U.     ciprofloxacin 250 MG tablet  Commonly known as:  CIPRO  Take 1 tablet (250 mg total) by mouth 2 (two) times daily.     clonazePAM 0.5 MG tablet  Commonly known as:  KLONOPIN  Take 1 tablet (0.5 mg total) by mouth 2 (two) times daily.     clopidogrel 75 MG tablet  Commonly known as:  PLAVIX  Take 1 tablet (75 mg total) by mouth daily.     diclofenac sodium 1 % Gel  Commonly known as:  VOLTAREN  Apply 4 g topically 2 (two) times daily as needed (for knee pain).     fenofibrate 54 MG tablet  Take 54 mg by mouth daily.     ferrous sulfate 325 (65 FE) MG tablet  Take 325 mg by mouth daily with breakfast.     fesoterodine 4 MG Tb24 tablet  Commonly  known as:  TOVIAZ  Take 4 mg by mouth daily.     fish oil-omega-3 fatty acids 1000 MG capsule  Take 1 g by mouth daily.     hydrALAZINE 25 MG tablet  Commonly known as:  APRESOLINE  Take 25 mg by mouth 3 (three) times daily.     losartan-hydrochlorothiazide 50-12.5 MG per tablet  Commonly known as:  HYZAAR  Take 1 tablet by mouth daily.     metFORMIN 500 MG tablet  Commonly known as:  GLUCOPHAGE  Take 1 tablet (500 mg total) by mouth daily with breakfast.     metoprolol 100 MG tablet  Commonly known as:  LOPRESSOR  Take 1 tablet (100 mg total) by mouth 2 (two) times daily.     nitroGLYCERIN 0.4 MG SL tablet  Commonly known as:  NITROSTAT  Place 0.4 mg under the tongue every 5 (five) minutes as needed for chest pain. Max 3 tablets in 15 minutes     oxyCODONE-acetaminophen 5-325 MG per tablet  Commonly known as:  PERCOCET/ROXICET  Take 1-2 tablets by  mouth every 8 (eight) hours as needed for severe pain (pain).     phenazopyridine 100 MG tablet  Commonly known as:  PYRIDIUM  Take 1 tablet (100 mg total) by mouth 3 (three) times daily as needed for pain.     polyethylene glycol packet  Commonly known as:  MIRALAX / GLYCOLAX  Take 17 g by mouth daily as needed (pain).     QUEtiapine 25 MG tablet  Commonly known as:  SEROQUEL  Take 25-50 mg by mouth 2 (two) times daily. Take 1 tablet every morning and take 2 tablets every evening     SENNA S 8.6-50 MG per tablet  Generic drug:  senna-docusate  Take 1 tablet by mouth 2 (two) times daily.     zoster vaccine live (PF) 19400 UNT/0.65ML injection  Commonly known as:  ZOSTAVAX  Inject 19,400 Units into the skin once.       Allergies  Allergen Reactions  . Morphine And Related Other (See Comments)    Makes her crazy  . Enalapril Swelling    Facial swelling   Follow-up Information   Follow up with Abigail Grout, MD. Schedule an appointment as soon as possible for a visit in 1 week. [facility]    Specialty:   Internal Medicine   Contact information:   [address] [place] [address] [phone]       Schedule an appointment as soon as possible for a visit with [facility] (Recurrent Urinary tract infection)    Contact information:   [address]  [address] [place] [address] [phone]       Follow up with Dr. Judieth Keens. Schedule an appointment as soon as possible for a visit in 1 week. [facility] followup)    Contact information:   [address] [place], [place] [address] Tel: [phone]       The results of significant diagnostics from this hospitalization (including imaging, microbiology, ancillary and laboratory) are listed below for reference.    Significant Diagnostic Studies: Ct Head (brain) Wo Contrast  05/29/2014   CLINICAL DATA:  Code stroke.  Left-sided weakness.  EXAM: CT HEAD WITHOUT CONTRAST  TECHNIQUE: Contiguous axial images were obtained from the base of the skull through the vertex without intravenous contrast.  COMPARISON:  CT of the head performed 04/02/2014  FINDINGS: There is no evidence of acute infarction, mass lesion, or intra- or extra-axial hemorrhage on CT.  Mild periventricular white matter change likely reflects small vessel ischemic microangiopathy. Prominence of the ventricles and sulci suggests mild cortical volume loss. Mild cerebellar atrophy is noted.  The brainstem and fourth ventricle are within normal limits. The basal ganglia are unremarkable in appearance. The cerebral hemispheres demonstrate grossly normal gray-white differentiation. No mass effect or midline shift is seen.  There is no evidence of fracture; visualized osseous structures are unremarkable in appearance. The orbits are within normal limits. The paranasal sinuses and mastoid air cells are well-aerated. No significant soft tissue abnormalities are seen.  IMPRESSION: 1. No acute intracranial pathology seen on CT. 2. Mild cortical volume loss and scattered  small vessel ischemic microangiopathy.  These results were called by telephone at the time of interpretation on 05/29/2014 at 1:44 am to Dr. Marisa Severin , who verbally acknowledged these results.   Electronically Signed   By: Roanna Raider M.D.   On: 05/29/2014 01:45   Mr Maxine Glenn Head Wo Contrast  05/29/2014   ADDENDUM REPORT: 05/29/2014 12:26  ADDENDUM: Sarita Bottom  discussed by telephone with on Delton See, PAC on 05/29/2014 at 1218 hrs. Also note:  CORRECTION:  Impression #2 should read  " 2.  Occluded LEFT ACA in the A2 segment.  "   Electronically Signed   By: Augusto Gamble M.D.   On: 05/29/2014 12:26   05/29/2014   CLINICAL DATA:  79 year old female with current history of stroke 3 months ago with residual left-sided weakness. Episode of left side weakness exacerbation associated with acute headache. Initial encounter.  EXAM: MRI HEAD WITHOUT CONTRAST  MRA HEAD WITHOUT CONTRAST  TECHNIQUE: Multiplanar, multiecho pulse sequences of the brain and surrounding structures were obtained without intravenous contrast. Angiographic images of the head were obtained using MRA technique without contrast.  COMPARISON:  Brain MRI and MRA 02/22/2014, and earlier.  FINDINGS: MRI HEAD FINDINGS  Major intracranial vascular flow voids are stable. Gyriform restricted diffusion in the right pericallosal sulcus Peri cingulate sulcus. Series 8, image 16. Small foci of associated subcortical white matter involvement. Minimal T2 and FLAIR hyperintensity at this time. Nearby chronic subcortical lacunar infarct (series 7, image 18), new compared to July. No associated mass effect or hemorrhage.  No contralateral left hemisphere or posterior fossa restricted diffusion. Patchy periventricular white matter T2 and FLAIR hyperintensity elsewhere is stable.  No midline shift, mass effect, evidence of mass lesion, ventriculomegaly, extra-axial collection or acute intracranial hemorrhage. Cervicomedullary junction and pituitary are within normal  limits. Negative visualized cervical spine. Normal bone marrow signal. Visible internal auditory structures appear normal. Visualized paranasal sinuses and mastoids are clear. Stable postoperative appearance of the globes. Visualized scalp soft tissues are within normal limits.  MRA HEAD FINDINGS  Less motion artifact today.  Stable antegrade flow in the posterior circulation with codominant distal vertebral arteries and normal PICA origins. Normal vertebrobasilar junction, basilar artery, SCA and PCA origins. Normal left posterior communicating artery, the right is diminutive or absent. Bilateral PCA branches are within normal limits.  Stable antegrade flow in both ICA siphons. Cavernous segment irregularity on the left re - identified and likely atherosclerotic pseudo lesion (series 6, image 84), stable. No left siphon stenosis. There is moderate supra clinoid stenosis distal to the ophthalmic artery origin, stable. Ophthalmic and left posterior communicating artery origins are within normal limits. Patent carotid termini.  The left ACA A1 segment appears dominant, but the right is irregular and diminutive. There is occlusion of the right ACA in A2 segment (series 6, image 153), with no distal flow signal identified. The visualized left ACA remains patent.  Patent MCA origins. Moderate left greater than right M1 segment irregularity. Moderate left M1 stenosis, with preserved distal flow. Bilateral MCA bifurcations remain patent. No major MCA branch occlusion identified.  IMPRESSION: 1. Acute on chronic right ACA territory ischemia. No mass effect or hemorrhage. 2. Occluded left ACA in the A2 segment. 3. Widespread anterior circulation atherosclerosis, with moderate left MCA M1 and right supra clinoid ICA stenoses. 4. Negative posterior circulation.  Electronically Signed: By: Augusto Gamble M.D. On: 05/29/2014 11:50   Mr Brain Wo Contrast  05/29/2014   ADDENDUM REPORT: 05/29/2014 12:26  ADDENDUM: Study discussed by  telephone with on Delton See, PAC on 05/29/2014 at 1218 hrs. Also note:  CORRECTION:  Impression #2 should read  " 2.  Occluded LEFT ACA in the A2 segment.  "   Electronically Signed   By: Augusto Gamble M.D.   On: 05/29/2014 12:26   05/29/2014   CLINICAL DATA:  79 year old female with current history of  stroke 3 months ago with residual left-sided weakness. Episode of left side weakness exacerbation associated with acute headache. Initial encounter.  EXAM: MRI HEAD WITHOUT CONTRAST  MRA HEAD WITHOUT CONTRAST  TECHNIQUE: Multiplanar, multiecho pulse sequences of the brain and surrounding structures were obtained without intravenous contrast. Angiographic images of the head were obtained using MRA technique without contrast.  COMPARISON:  Brain MRI and MRA 02/22/2014, and earlier.  FINDINGS: MRI HEAD FINDINGS  Major intracranial vascular flow voids are stable. Gyriform restricted diffusion in the right pericallosal sulcus Peri cingulate sulcus. Series 8, image 16. Small foci of associated subcortical white matter involvement. Minimal T2 and FLAIR hyperintensity at this time. Nearby chronic subcortical lacunar infarct (series 7, image 18), new compared to July. No associated mass effect or hemorrhage.  No contralateral left hemisphere or posterior fossa restricted diffusion. Patchy periventricular white matter T2 and FLAIR hyperintensity elsewhere is stable.  No midline shift, mass effect, evidence of mass lesion, ventriculomegaly, extra-axial collection or acute intracranial hemorrhage. Cervicomedullary junction and pituitary are within normal limits. Negative visualized cervical spine. Normal bone marrow signal. Visible internal auditory structures appear normal. Visualized paranasal sinuses and mastoids are clear. Stable postoperative appearance of the globes. Visualized scalp soft tissues are within normal limits.  MRA HEAD FINDINGS  Less motion artifact today.  Stable antegrade flow in the posterior circulation  with codominant distal vertebral arteries and normal PICA origins. Normal vertebrobasilar junction, basilar artery, SCA and PCA origins. Normal left posterior communicating artery, the right is diminutive or absent. Bilateral PCA branches are within normal limits.  Stable antegrade flow in both ICA siphons. Cavernous segment irregularity on the left re - identified and likely atherosclerotic pseudo lesion (series 6, image 84), stable. No left siphon stenosis. There is moderate supra clinoid stenosis distal to the ophthalmic artery origin, stable. Ophthalmic and left posterior communicating artery origins are within normal limits. Patent carotid termini.  The left ACA A1 segment appears dominant, but the right is irregular and diminutive. There is occlusion of the right ACA in A2 segment (series 6, image 153), with no distal flow signal identified. The visualized left ACA remains patent.  Patent MCA origins. Moderate left greater than right M1 segment irregularity. Moderate left M1 stenosis, with preserved distal flow. Bilateral MCA bifurcations remain patent. No major MCA branch occlusion identified.  IMPRESSION: 1. Acute on chronic right ACA territory ischemia. No mass effect or hemorrhage. 2. Occluded left ACA in the A2 segment. 3. Widespread anterior circulation atherosclerosis, with moderate left MCA M1 and right supra clinoid ICA stenoses. 4. Negative posterior circulation.  Electronically Signed: By: Augusto Gamble M.D. On: 05/29/2014 11:50    Microbiology: Recent Results (from the past 240 hour(s))  URINE CULTURE     Status: None   Collection Time    05/29/14  1:55 AM      Result Value Ref Range Status   Specimen Description URINE, CATHETERIZED   Final   Special Requests NONE   Final   Culture  Setup Time     Final   Value: 05/29/2014 11:06     Performed at [facility] Count     Final   Value: >=100,000 COLONIES/ML     Performed at [facility]   Culture     Final    Value: GRAM NEGATIVE RODS     Performed at [facility]   Report Status PENDING   Incomplete     Labs: Basic Metabolic Panel:  Recent  Labs Lab 05/29/14 0120 05/29/14 0129  NA 140 141  K 4.1 3.7  CL 101 103  CO2 24  --   GLUCOSE 239* 238*  BUN 25* 26*  CREATININE 1.29* 1.40*  CALCIUM 9.6  --    Liver Function Tests:  Recent Labs Lab 05/29/14 0120  AST 14  ALT 12  ALKPHOS 64  BILITOT 0.3  PROT 7.1  ALBUMIN 3.5   No results found for this basename: LIPASE, AMYLASE,  in the last 168 hours No results found for this basename: AMMONIA,  in the last 168 hours CBC:  Recent Labs Lab 05/29/14 0120 05/29/14 0129  WBC 7.2  --   NEUTROABS 5.2  --   HGB 13.0 13.6  HCT 38.2 40.0  MCV 87.4  --   PLT 178  --    Cardiac Enzymes: No results found for this basename: CKTOTAL, CKMB, CKMBINDEX, TROPONINI,  in the last 168 hours BNP: BNP (last 3 results) No results found for this basename: PROBNP,  in the last 8760 hours CBG:  Recent Labs Lab 05/29/14 1220 05/29/14 1644 05/29/14 1945 05/30/14 0747 05/30/14 1153  GLUCAP 184* 204* 168* 143* 186*       Signed:  Savannha Welle  [facility] 05/30/2014, 2:05 PM

## 2014-05-30 NOTE — Evaluation (Signed)
Occupational Therapy Evaluation Patient Details Name: Abigail Brooks MRN: [id number] DOB: 19-Sep-1934 Today's Date: 05/30/2014    History of Present Illness 79 y.o. female with PMH of DM, HTN, Dementia, CAD, CHF, who presents with weakness and dysuria. Residual left weakness due to previous CVA.   Clinical Impression   Pt admitted with above. Pt ambulating well during session. Education provided to pt and family. OT to sign off.     Follow Up Recommendations  No OT follow up;Supervision - Intermittent    Equipment Recommendations  None recommended by OT    Recommendations for Other Services       Precautions / Restrictions Restrictions Weight Bearing Restrictions: No      Mobility Bed Mobility Overal bed mobility: Needs Assistance Bed Mobility: Rolling;Sidelying to Sit Rolling: Min assist Sidelying to sit: Max assist       General bed mobility comments: cues for technique. Pt attempted to perform supine to sit and had difficulty and could not raise self up. Instructed on log roll technique. Assist to scoot hips to EOB and raise trunk. Son states she has headboard he thinks she uses to assist.  Transfers Overall transfer level: Needs assistance Equipment used: Rolling walker (2 wheeled) Transfers: Sit to/from Stand Sit to Stand: Supervision         General transfer comment: cues for hand placement.    Balance                                            ADL Overall ADL's : Needs assistance/impaired     Grooming: Set up;Sitting               Lower Body Dressing: Supervision/safety;Sit to/from stand   Toilet Transfer: Ambulation;Supervision/safety;Modified Independent;RW (bed)           Functional mobility during ADLs: Supervision/safety;Modified independent;Rolling walker General ADL Comments: Educated on BE FAST stroke education and recommended avoiding canned foods due to increased sodium. Educated on safety tips for home  (safe shoes, rugs-non skid in bathroom, sitting for LB ADLs, use of bag on walker). Educated on AE for LB ADLs (reacher, long handled sponge, what to use for toilet aide).     Vision  Pt reports no change from baseline. Wears glasses.  Visual fields: No apparent deficits     Tracking/Visual Pursuits: Able to track stimulus in all quads without difficulty             Perception     Praxis      Pertinent Vitals/Pain Pain Assessment: No/denies pain     Hand Dominance     Extremity/Trunk Assessment Upper Extremity Assessment Upper Extremity Assessment: LUE deficits/detail LUE Deficits / Details: residual weakness; 4/5 shoulder flexion   Lower Extremity Assessment Lower Extremity Assessment: Overall WFL for tasks assessed       Communication Communication Communication: No difficulties   Cognition Arousal/Alertness: Awake/alert Behavior During Therapy: WFL for tasks assessed/performed Overall Cognitive Status: History of cognitive impairments - at baseline                     General Comments       Exercises       Shoulder Instructions      Home Living Family/patient expects to be discharged to:: Private residence Living Arrangements: Alone Available Help at Discharge: Family;Available PRN/intermittently Type of Home: House Home Access: Stairs  to enter Entrance Stairs-Number of Steps: 3 Entrance Stairs-Rails: Right;Left Home Layout: One level     Bathroom Shower/Tub: Tub/shower unit;Walk-in shower   Bathroom Toilet: Standard     Home Equipment: Environmental consultantWalker - 2 wheels;Cane - single point;Grab bars - tub/shower;Bedside commode (stool for shower)          Prior Functioning/Environment Level of Independence: Needs assistance    ADL's / Homemaking Assistance Needed: supervision for shower transfer   Comments: does have son with her when doing steps    OT Diagnosis: Generalized weakness   OT Problem List:     OT Treatment/Interventions:       OT Goals(Current goals can be found in the care plan section) Acute Rehab OT Goals Patient Stated Goal: go home  OT Frequency:     Barriers to D/C:            Co-evaluation              End of Session Equipment Utilized During Treatment: Gait belt;Rolling walker  Activity Tolerance: Patient tolerated treatment well Patient left: in bed;with family/visitor present   Time: 1209-1240 OT Time Calculation (min): 31 min Charges:  OT General Charges $OT Visit: 1 Procedure OT Evaluation $Initial OT Evaluation Tier I: 1 Procedure OT Treatments $Self Care/Home Management : 8-22 mins G-CodesEarlie Raveling:    Oretta Berkland L OTR/L [phone] 05/30/2014, 1:12 PM

## 2014-05-30 NOTE — Progress Notes (Signed)
STROKE TEAM PROGRESS NOTE   HISTORY Abigail Brooks is an 79 y.o. female with multiple medical problems including HTN, DM, CAD s/p stenting, chronic congestive heart failure, MI, ischemic stroke 3 months ago with residual left sided weakness, brought to [facility] by ambulance due to acute onset of the above stated symptoms.  EMS was called because patient had a fall, and family reported that patient was sitting in a couch, slumped over to the left, and was weaker in the left side.  She denies HA, vertigo, double vision, difficulty swallowing, focal numbness, confusion, or vision impairment.  NIHSS 2.  CT brain revealed no acute intracranial abnormality.  Date last known well:  Time last known well:  tPA Given: no, minimal deficits.  NIHSS: 2    SUBJECTIVE (INTERVAL HISTORY) Patient says Friday she was sitting on the couch and fell asleep. When she woke up, she couldn't move her left side, went on for an hour and she hit her life alert necklace. The symptoms slowly resolved. This happened to her a month ago. No vision or speech changes, she feels back to baseline. No headaches. She is on Aspirin and Plavix at home and may have missed doses.    OBJECTIVE Temp:  [97.8 F (36.6 C)-98.3 F (36.8 C)] 98.3 F (36.8 C) (10/11 0435) Pulse Rate:  [57-76] 61 (10/11 0941) Cardiac Rhythm:  [-] Normal sinus rhythm;Sinus bradycardia (10/11 0848) Resp:  [18-20] 18 (10/11 0435) BP: (150-152)/(53-73) 150/70 mmHg (10/11 0941) SpO2:  [93 %-96 %] 95 % (10/11 0435) Weight:  [188 lb 8 oz (85.503 kg)] 188 lb 8 oz (85.503 kg) (10/11 0435)   Recent Labs Lab 05/29/14 1220 05/29/14 1644 05/29/14 1945 05/30/14 0747 05/30/14 1153  GLUCAP 184* 204* 168* 143* 186*    Recent Labs Lab 05/29/14 0120 05/29/14 0129  NA 140 141  K 4.1 3.7  CL 101 103  CO2 24  --   GLUCOSE 239* 238*  BUN 25* 26*  CREATININE 1.29* 1.40*  CALCIUM 9.6  --     Recent Labs Lab 05/29/14 0120  AST 14  ALT 12  ALKPHOS 64   BILITOT 0.3  PROT 7.1  ALBUMIN 3.5    Recent Labs Lab 05/29/14 0120 05/29/14 0129  WBC 7.2  --   NEUTROABS 5.2  --   HGB 13.0 13.6  HCT 38.2 40.0  MCV 87.4  --   PLT 178  --    No results found for this basename: CKTOTAL, CKMB, CKMBINDEX, TROPONINI,  in the last 168 hours  Recent Labs  05/29/14 0120  LABPROT 13.4  INR 1.02    Recent Labs  05/29/14 0155  COLORURINE ORANGE*  LABSPEC 1.018  PHURINE 5.0  GLUCOSEU NEGATIVE  HGBUR TRACE*  BILIRUBINUR NEGATIVE  KETONESUR 15*  PROTEINUR NEGATIVE  UROBILINOGEN 1.0  NITRITE POSITIVE*  LEUKOCYTESUR LARGE*       Component Value Date/Time   CHOL 150 05/30/2014 0500   TRIG 295* 05/30/2014 0500   HDL 35* 05/30/2014 0500   HDL 50 06/08/2013 0853   HDL 47 06/08/2013 0853   CHOLHDL 4.3 05/30/2014 0500   CHOLHDL 4.1 06/08/2013 0853   CHOLHDL 4.3 06/08/2013 0853   VLDL 59* 05/30/2014 0500   LDLCALC 56 05/30/2014 0500   LDLCALC 105* 06/08/2013 0853   LDLCALC 107* 06/08/2013 0853   Lab Results  Component Value Date   HGBA1C 6.9* 05/29/2014      Component Value Date/Time   LABOPIA NONE DETECTED 02/21/2014 1716   COCAINSCRNUR NONE  DETECTED 02/21/2014 1716   LABBENZ POSITIVE* 02/21/2014 1716   AMPHETMU NONE DETECTED 02/21/2014 1716   THCU NONE DETECTED 02/21/2014 1716   LABBARB NONE DETECTED 02/21/2014 1716    No results found for this basename: ETH,  in the last 168 hours  Dg Chest 2 View  05/30/2014   CLINICAL DATA:  Lung crackles today, ex-smoker,  EXAM: CHEST  2 VIEW  COMPARISON:  04/02/2014  FINDINGS: Cardiomegaly again noted. No acute infiltrate or pleural effusion. No pulmonary edema. Stable degenerative changes thoracic spine. Atherosclerotic calcifications of thoracic aorta. Mild elevation of the right hemidiaphragm.  IMPRESSION: Cardiomegaly. No active disease. Mild elevation of the right hemidiaphragm. Stable degenerative changes thoracic spine.   Electronically Signed   By: Natasha MeadLiviu  Pop M.D.   On: 05/30/2014 11:26    Ct Head (brain) Wo Contrast  05/29/2014   CLINICAL DATA:  Code stroke.  Left-sided weakness.  EXAM: CT HEAD WITHOUT CONTRAST  TECHNIQUE: Contiguous axial images were obtained from the base of the skull through the vertex without intravenous contrast.  COMPARISON:  CT of the head performed 04/02/2014  FINDINGS: There is no evidence of acute infarction, mass lesion, or intra- or extra-axial hemorrhage on CT.  Mild periventricular white matter change likely reflects small vessel ischemic microangiopathy. Prominence of the ventricles and sulci suggests mild cortical volume loss. Mild cerebellar atrophy is noted.  The brainstem and fourth ventricle are within normal limits. The basal ganglia are unremarkable in appearance. The cerebral hemispheres demonstrate grossly normal gray-white differentiation. No mass effect or midline shift is seen.  There is no evidence of fracture; visualized osseous structures are unremarkable in appearance. The orbits are within normal limits. The paranasal sinuses and mastoid air cells are well-aerated. No significant soft tissue abnormalities are seen.  IMPRESSION: 1. No acute intracranial pathology seen on CT. 2. Mild cortical volume loss and scattered small vessel ischemic microangiopathy.  These results were called by telephone at the time of interpretation on 05/29/2014 at 1:44 am to Dr. Marisa SeverinLGA OTTER , who verbally acknowledged these results.   Electronically Signed   By: Roanna RaiderJeffery  Chang M.D.   On: 05/29/2014 01:45   Mr Shirlee LatchMra Head ZOWo Contrast  05/29/2014   ADDENDUM REPORT: 05/29/2014 12:26  ADDENDUM: Study discussed by telephone with on Delton Seeavid Rinehuls, PAC on 05/29/2014 at 1218 hrs. Also note:  CORRECTION:  Impression #2 should read  " 2.  Occluded LEFT ACA in the A2 segment.  "   Electronically Signed   By: Augusto GambleLee  Hall M.D.   On: 05/29/2014 12:26   05/29/2014   CLINICAL DATA:  79 year old female with current history of stroke 3 months ago with residual left-sided weakness.  Episode of left side weakness exacerbation associated with acute headache. Initial encounter.  EXAM: MRI HEAD WITHOUT CONTRAST  MRA HEAD WITHOUT CONTRAST  TECHNIQUE: Multiplanar, multiecho pulse sequences of the brain and surrounding structures were obtained without intravenous contrast. Angiographic images of the head were obtained using MRA technique without contrast.  COMPARISON:  Brain MRI and MRA 02/22/2014, and earlier.  FINDINGS: MRI HEAD FINDINGS  Major intracranial vascular flow voids are stable. Gyriform restricted diffusion in the right pericallosal sulcus Peri cingulate sulcus. Series 8, image 16. Small foci of associated subcortical white matter involvement. Minimal T2 and FLAIR hyperintensity at this time. Nearby chronic subcortical lacunar infarct (series 7, image 18), new compared to July. No associated mass effect or hemorrhage.  No contralateral left hemisphere or posterior fossa restricted diffusion. Patchy periventricular white matter T2  and FLAIR hyperintensity elsewhere is stable.  No midline shift, mass effect, evidence of mass lesion, ventriculomegaly, extra-axial collection or acute intracranial hemorrhage. Cervicomedullary junction and pituitary are within normal limits. Negative visualized cervical spine. Normal bone marrow signal. Visible internal auditory structures appear normal. Visualized paranasal sinuses and mastoids are clear. Stable postoperative appearance of the globes. Visualized scalp soft tissues are within normal limits.  MRA HEAD FINDINGS  Less motion artifact today.  Stable antegrade flow in the posterior circulation with codominant distal vertebral arteries and normal PICA origins. Normal vertebrobasilar junction, basilar artery, SCA and PCA origins. Normal left posterior communicating artery, the right is diminutive or absent. Bilateral PCA branches are within normal limits.  Stable antegrade flow in both ICA siphons. Cavernous segment irregularity on the left re -  identified and likely atherosclerotic pseudo lesion (series 6, image 84), stable. No left siphon stenosis. There is moderate supra clinoid stenosis distal to the ophthalmic artery origin, stable. Ophthalmic and left posterior communicating artery origins are within normal limits. Patent carotid termini.  The left ACA A1 segment appears dominant, but the right is irregular and diminutive. There is occlusion of the right ACA in A2 segment (series 6, image 153), with no distal flow signal identified. The visualized left ACA remains patent.  Patent MCA origins. Moderate left greater than right M1 segment irregularity. Moderate left M1 stenosis, with preserved distal flow. Bilateral MCA bifurcations remain patent. No major MCA branch occlusion identified.  IMPRESSION: 1. Acute on chronic right ACA territory ischemia. No mass effect or hemorrhage. 2. Occluded left ACA in the A2 segment. 3. Widespread anterior circulation atherosclerosis, with moderate left MCA M1 and right supra clinoid ICA stenoses. 4. Negative posterior circulation.  Electronically Signed: By: Augusto Gamble M.D. On: 05/29/2014 11:50   Mr Brain Wo Contrast  05/29/2014   ADDENDUM REPORT: 05/29/2014 12:26  ADDENDUM: Study discussed by telephone with on Delton See, PAC on 05/29/2014 at 1218 hrs. Also note:  CORRECTION:  Impression #2 should read  " 2.  Occluded LEFT ACA in the A2 segment.  "   Electronically Signed   By: Augusto Gamble M.D.   On: 05/29/2014 12:26   05/29/2014   CLINICAL DATA:  79 year old female with current history of stroke 3 months ago with residual left-sided weakness. Episode of left side weakness exacerbation associated with acute headache. Initial encounter.  EXAM: MRI HEAD WITHOUT CONTRAST  MRA HEAD WITHOUT CONTRAST  TECHNIQUE: Multiplanar, multiecho pulse sequences of the brain and surrounding structures were obtained without intravenous contrast. Angiographic images of the head were obtained using MRA technique without contrast.   COMPARISON:  Brain MRI and MRA 02/22/2014, and earlier.  FINDINGS: MRI HEAD FINDINGS  Major intracranial vascular flow voids are stable. Gyriform restricted diffusion in the right pericallosal sulcus Peri cingulate sulcus. Series 8, image 16. Small foci of associated subcortical white matter involvement. Minimal T2 and FLAIR hyperintensity at this time. Nearby chronic subcortical lacunar infarct (series 7, image 18), new compared to July. No associated mass effect or hemorrhage.  No contralateral left hemisphere or posterior fossa restricted diffusion. Patchy periventricular white matter T2 and FLAIR hyperintensity elsewhere is stable.  No midline shift, mass effect, evidence of mass lesion, ventriculomegaly, extra-axial collection or acute intracranial hemorrhage. Cervicomedullary junction and pituitary are within normal limits. Negative visualized cervical spine. Normal bone marrow signal. Visible internal auditory structures appear normal. Visualized paranasal sinuses and mastoids are clear. Stable postoperative appearance of the globes. Visualized scalp soft tissues are within normal  limits.  MRA HEAD FINDINGS  Less motion artifact today.  Stable antegrade flow in the posterior circulation with codominant distal vertebral arteries and normal PICA origins. Normal vertebrobasilar junction, basilar artery, SCA and PCA origins. Normal left posterior communicating artery, the right is diminutive or absent. Bilateral PCA branches are within normal limits.  Stable antegrade flow in both ICA siphons. Cavernous segment irregularity on the left re - identified and likely atherosclerotic pseudo lesion (series 6, image 84), stable. No left siphon stenosis. There is moderate supra clinoid stenosis distal to the ophthalmic artery origin, stable. Ophthalmic and left posterior communicating artery origins are within normal limits. Patent carotid termini.  The left ACA A1 segment appears dominant, but the right is irregular and  diminutive. There is occlusion of the right ACA in A2 segment (series 6, image 153), with no distal flow signal identified. The visualized left ACA remains patent.  Patent MCA origins. Moderate left greater than right M1 segment irregularity. Moderate left M1 stenosis, with preserved distal flow. Bilateral MCA bifurcations remain patent. No major MCA branch occlusion identified.  IMPRESSION: 1. Acute on chronic right ACA territory ischemia. No mass effect or hemorrhage. 2. Occluded left ACA in the A2 segment. 3. Widespread anterior circulation atherosclerosis, with moderate left MCA M1 and right supra clinoid ICA stenoses. 4. Negative posterior circulation.  Electronically Signed: By: Augusto Gamble M.D. On: 05/29/2014 11:50   Echo Study Conclusions  - Procedure narrative: Transthoracic echocardiography. Image quality was adequate. The study was technically difficult. - Left ventricle: The cavity size was normal. Wall thickness was increased in a pattern of moderate LVH. Systolic function was normal. The estimated ejection fraction was in the range of 55% to 60%. Wall motion was normal; there were no regional wall motion abnormalities. - Mitral valve: Calcified annulus. - Atrial septum: No defect or patent foramen ovale was identified. - Pericardium, extracardiac: A trivial pericardial effusion was identified posterior to the heart.    PHYSICAL EXAM Physical exam: Exam: Gen: NAD Eyes: anicteric sclerae, moist conjunctivae                    CV: RRR, no MRG Mental Status: Alert, following commands, some difficulty with spelling and addition  Neuro: Detailed Neurologic Exam  Speech:    fluent  Cranial Nerves:    The pupils are equal, round, and reactive to light. Face symmetric. VFF full. Tongue midline   Motor Observation:    no involuntary movements noted.     Strength:    Strength intact, no drift     Cortical Sensory Modalities:     right visual  neglect   ASSESSMENT/PLAN Abigail Brooks is an 79 y.o. female with multiple medical problems including HTN, DM, CAD s/p stenting, chronic congestive heart failure, MI, ischemic stroke 3 months ago with residual left sided weakness, MRI showed Acute on chronic right ACA territory ischemia. No mass effect or hemorrhage. 2. Occluded left ACA in the A2 segment. Widespread anterior circulation atherosclerosis, with moderate left MCA M1 and right supra clinoid ICA stenoses.  Symptoms resolved.    EEG WNL Carotid Doppler: Summary: EF 55-60 Bilateral: 1-39% ICA stenosis. Vertebral artery flow is antegrade.    LDL 56  HgbA1c 6.9  ASA and plavix prior to admission, now on ASA and plavix, discuss compliance  Patient counseled to be compliant with her antithrombotic medications  Risk factor education - uncontrolled diabetes. Needs f/u with PCP  Ongoing aggressive risk factor management  Disposition:  Home  Needs to follow up with me in the office within the next week    Hospital day # 1  I have personally examined this patient, reviewed notes, independently viewed imaging studies, participated in medical decision making and plan of care. I have made any additions or clarifications directly to the above note. Agree with note above.  Artemio Alyoni Ayinde Swim, MD  [facility]  Pager: [phone] 05/28/2014 5:16 PM      To contact Stroke Continuity provider, please refer to [email or web address]. After hours, contact General Neurology

## 2014-05-30 NOTE — Progress Notes (Signed)
PT Cancellation Note  Patient Details Name: Michela PitcherFrances L Mondo MRN: [id number] DOB: 03/17/1935   Cancelled Treatment:    Reason Eval/Treat Not Completed: PT screened, no needs identified, will sign off.  Pt is at baseline mobility level.  No skilled PT intervention indicated.   Ilda FoilGarrow, Abbygael Curtiss Rene 05/30/2014, 12:55 PM

## 2014-05-30 NOTE — Progress Notes (Signed)
Utilization review completed.  

## 2014-05-30 NOTE — Discharge Instructions (Signed)
Ischemic Stroke °A stroke (cerebrovascular accident) is the sudden death of brain tissue. It is a medical emergency. A stroke can cause permanent loss of brain function. This can cause problems with different parts of your body. A transient ischemic attack (TIA) is different because it does not cause permanent damage. A TIA is a short-lived problem of poor blood flow affecting a part of the brain. A TIA is also a serious problem because having a TIA greatly increases the chances of having a stroke. When symptoms first develop, you cannot know if the problem might be a stroke or a TIA. °CAUSES  °A stroke is caused by a decrease of oxygen supply to an area of your brain. It is usually the result of a small blood clot or collection of cholesterol or fat (plaque) that blocks blood flow in the brain. A stroke can also be caused by blocked or damaged carotid arteries.  °RISK FACTORS °· High blood pressure (hypertension). °· High cholesterol. °· Diabetes mellitus. °· Heart disease. °· The buildup of plaque in the blood vessels (peripheral artery disease or atherosclerosis). °· The buildup of plaque in the blood vessels providing blood and oxygen to the brain (carotid artery stenosis). °· An abnormal heart rhythm (atrial fibrillation). °· Obesity. °· Smoking. °· Taking oral contraceptives (especially in combination with smoking). °· Physical inactivity. °· A diet high in fats, salt (sodium), and calories. °· Alcohol use. °· Use of illegal drugs (especially cocaine and methamphetamine). °· Being African American. °· Being over the age of 55. °· Family history of stroke. °· Previous history of blood clots, stroke, TIA, or heart attack. °· Sickle cell disease. °SYMPTOMS  °These symptoms usually develop suddenly, or may be newly present upon awakening from sleep: °· Sudden weakness or numbness of the face, arm, or leg, especially on one side of the body. °· Sudden trouble walking or difficulty moving arms or legs. °· Sudden  confusion. °· Sudden personality changes. °· Trouble speaking (aphasia) or understanding. °· Difficulty swallowing. °· Sudden trouble seeing in one or both eyes. °· Double vision. °· Dizziness. °· Loss of balance or coordination. °· Sudden severe headache with no known cause. °· Trouble reading or writing. °DIAGNOSIS  °Your health care provider can often determine the presence or absence of a stroke based on your symptoms, history, and physical exam. Computed tomography (CT) of the brain is usually performed to confirm the stroke, determine causes, and determine stroke severity. Other tests may be done to find the cause of the stroke. These tests may include: °· Electrocardiography. °· Continuous heart monitoring. °· Echocardiography. °· Carotid ultrasonography. °· Magnetic resonance imaging (MRI). °· A scan of the brain circulation. °· Blood tests. °PREVENTION  °The risk of a stroke can be decreased by appropriately treating high blood pressure, high cholesterol, diabetes, heart disease, and obesity and by quitting smoking, limiting alcohol, and staying physically active. °TREATMENT  °Time is of the essence. It is important to seek treatment at the first sign of these symptoms because you may receive a medicine to dissolve the clot (thrombolytic) that cannot be given if too much time has passed since your symptoms began. Even if you do not know when your symptoms began, get treatment as soon as possible as there are other treatment options available including oxygen, intravenous (IV) fluids, and medicines to thin the blood (anticoagulants). Treatment of stroke depends on the duration, severity, and cause of your symptoms. Medicines and dietary changes may be used to address diabetes, high blood   pressure, and other risk factors. Physical, speech, and occupational therapists will assess you and work with you to improve any functions impaired by the stroke. Measures will be taken to prevent short-term and long-term  complications, including infection from breathing foreign material into the lungs (aspiration pneumonia), blood clots in the legs, bedsores, and falls. Rarely, surgery may be needed to remove large blood clots or to open up blocked arteries. °HOME CARE INSTRUCTIONS  °· Take medicines only as directed by your health care provider. Follow the directions carefully. Medicines may be used to control risk factors for a stroke. Be sure you understand all your medicine instructions. °· You may be told to take a medicine to thin the blood, such as aspirin or the anticoagulant warfarin. Warfarin needs to be taken exactly as instructed. °¨ Too much and too little warfarin are both dangerous. Too much warfarin increases the risk of bleeding. Too little warfarin continues to allow the risk for blood clots. While taking warfarin, you will need to have regular blood tests to measure your blood clotting time. These blood tests usually include both the PT and INR tests. The PT and INR results allow your health care provider to adjust your dose of warfarin. The dose can change for many reasons. It is critically important that you take warfarin exactly as prescribed, and that you have your PT and INR levels drawn exactly as directed. °¨ Many foods, especially foods high in vitamin K, can interfere with warfarin and affect the PT and INR results. Foods high in vitamin K include spinach, kale, broccoli, cabbage, collard and turnip greens, brussels sprouts, peas, cauliflower, seaweed, and parsley, as well as beef and pork liver, green tea, and soybean oil. You should eat a consistent amount of foods high in vitamin K. Avoid major changes in your diet, or notify your health care provider before changing your diet. Arrange a visit with a dietitian to answer your questions. °¨ Many medicines can interfere with warfarin and affect the PT and INR results. You must tell your health care provider about any and all medicines you take. This  includes all vitamins and supplements. Be especially cautious with aspirin and anti-inflammatory medicines. Do not take or discontinue any prescribed or over-the-counter medicine except on the advice of your health care provider or pharmacist. °¨ Warfarin can have side effects, such as excessive bruising or bleeding. You will need to hold pressure over cuts for longer than usual. Your health care provider or pharmacist will discuss other potential side effects. °¨ Avoid sports or activities that may cause injury or bleeding. °¨ Be mindful when shaving, flossing your teeth, or handling sharp objects. °¨ Alcohol can change the body's ability to handle warfarin. It is best to avoid alcoholic drinks or consume only very small amounts while taking warfarin. Notify your health care provider if you change your alcohol intake. °¨ Notify your dentist or other health care providers before procedures. °· If swallow studies have determined that your swallowing reflex is present, you should eat healthy foods. Including 5 or more servings of fruits and vegetables a day may reduce the risk of stroke. Foods may need to be a certain consistency (soft or pureed), or small bites may need to be taken in order to avoid aspirating or choking. Certain dietary changes may be advised to address high blood pressure, high cholesterol, diabetes, or obesity. °¨ Food choices that are low in sodium, saturated fat, trans fat, and cholesterol are recommended to manage high blood pressure. °¨   Food choies that are high in fiber, and low in saturated fat, trans fat, and cholesterol may control cholesterol levels.  Controlling carbohydrates and sugar intake is recommended to manage diabetes.  Reducing calorie intake and making food choices that are low in sodium, saturated fat, trans fat, and cholesterol are recommended to manage obesity.  Maintain a healthy weight.  Stay physically active. It is recommended that you get at least 30 minutes of  activity on all or most days.  Do not use any tobacco products including cigarettes, chewing tobacco, or electronic cigarettes.  Limit alcohol use even if you are not taking warfarin. Moderate alcohol use is considered to be:  No more than 2 drinks each day for men.  No more than 1 drink each day for nonpregnant women.  Home safety. A safe home environment is important to reduce the risk of falls. Your health care provider may arrange for specialists to evaluate your home. Having grab bars in the bedroom and bathroom is often important. Your health care provider may arrange for equipment to be used at home, such as raised toilets and a seat for the shower.  Physical, occupational, and speech therapy. Ongoing therapy may be needed to maximize your recovery after a stroke. If you have been advised to use a walker or a cane, use it at all times. Be sure to keep your therapy appointments.  Follow all instructions for follow-up with your health care provider. This is very important. This includes any referrals, physical therapy, rehabilitation, and lab tests. Proper follow-up can prevent another stroke from occurring. SEEK MEDICAL CARE IF:  You have personality changes.  You have difficulty swallowing.  You are seeing double.  You have dizziness.  You have a fever.  You have skin breakdown. SEEK IMMEDIATE MEDICAL CARE IF:  Any of these symptoms may represent a serious problem that is an emergency. Do not wait to see if the symptoms will go away. Get medical help right away. Call your local emergency services (911 in [place] Do not drive yourself to the hospital.  You have sudden weakness or numbness of the face, arm, or leg, especially on one side of the body.  You have sudden trouble walking or difficulty moving arms or legs.  You have sudden confusion.  You have trouble speaking (aphasia) or understanding.  You have sudden trouble seeing in one or both eyes.  You have a loss of  balance or coordination.  You have a sudden, severe headache with no known cause.  You have new chest pain or an irregular heartbeat.  You have a partial or total loss of consciousness. Document Released: 08/06/2005 Document Revised: 12/21/2013 Document Reviewed: 03/16/2012 [facility] Patient Information 2015 [place], [place]. This information is not intended to replace advice given to you by your health care provider. Make sure you discuss any questions you have with your health care provider. STROKE/TIA DISCHARGE INSTRUCTIONS SMOKING Cigarette smoking nearly doubles your risk of having a stroke & is the single most alterable risk factor  If you smoke or have smoked in the last 12 months, you are advised to quit smoking for your health.  Most of the excess cardiovascular risk related to smoking disappears within a year of stopping.  Ask you doctor about anti-smoking medications  [place] Quit Line: 1-800-QUIT NOW  Free Smoking Cessation Classes [phone]  CHOLESTEROL Know your levels; limit fat & cholesterol in your diet  Lipid Panel     Component Value Date/Time   CHOL 150  05/30/2014 0500   TRIG 295* 05/30/2014 0500   HDL 35* 05/30/2014 0500   HDL 50 06/08/2013 0853   HDL 47 06/08/2013 0853   CHOLHDL 4.3 05/30/2014 0500   CHOLHDL 4.1 06/08/2013 0853   CHOLHDL 4.3 06/08/2013 0853   VLDL 59* 05/30/2014 0500   LDLCALC 56 05/30/2014 0500   LDLCALC 105* 06/08/2013 0853   LDLCALC 107* 06/08/2013 0853      Many patients benefit from treatment even if their cholesterol is at goal.  Goal: Total Cholesterol (CHOL) less than 160  Goal:  Triglycerides (TRIG) less than 150  Goal:  HDL greater than 40  Goal:  LDL (LDLCALC) less than 100   BLOOD PRESSURE American Stroke Association blood pressure target is less that 120/80 mm/Hg  Your discharge blood pressure is:  BP: 150/70 mmHg  Monitor your blood pressure  Limit your salt and alcohol intake  Many individuals will require more  than one medication for high blood pressure  DIABETES (A1c is a blood sugar average for last 3 months) Goal HGBA1c is under 7% (HBGA1c is blood sugar average for last 3 months)  Diabetes: Diagnosis of diabetes:  Your A1c:6.9 %    Lab Results  Component Value Date   HGBA1C 6.9* 05/29/2014     Your HGBA1c can be lowered with medications, healthy diet, and exercise.  Check your blood sugar as directed by your physician  Call your physician if you experience unexplained or low blood sugars.  PHYSICAL ACTIVITY/REHABILITATION Goal is 30 minutes at least 4 days per week  Activity: Increase activity slowly, Therapies: Physical Therapy: pt at baseline , NO PT recommended Return to work: None   Activity decreases your risk of heart attack and stroke and makes your heart stronger.  It helps control your weight and blood pressure; helps you relax and can improve your mood.  Participate in a regular exercise program.  Talk with your doctor about the best form of exercise for you (dancing, walking, swimming, cycling).  DIET/WEIGHT Goal is to maintain a healthy weight  Your discharge diet is:   thin  liquids Your height is:  Height: 5' (152.4 cm) Your current weight is: Weight: 85.503 kg (188 lb 8 oz) Your Body Mass Index (BMI) is:  BMI (Calculated): 38.9  Following the type of diet specifically designed for you will help prevent another stroke.  Your goal weight range is: 98 - 123  Your goal Body Mass Index (BMI) is 19-24.  Healthy food habits can help reduce 3 risk factors for stroke:  High cholesterol, hypertension, and excess weight.  RESOURCES Stroke/Support Group:  Call [phone]   STROKE EDUCATION PROVIDED/REVIEWED AND GIVEN TO PATIENT Stroke warning signs and symptoms How to activate emergency medical system (call 911). Medications prescribed at discharge. Need for follow-up after discharge. Personal risk factors for stroke. Pneumonia vaccine given: No Flu vaccine given:  No My questions have been answered, the writing is legible, and I understand these instructions.  I will adhere to these goals & educational materials that have been provided to me after my discharge from the hospital.

## 2014-05-31 LAB — URINE CULTURE

## 2014-06-01 ENCOUNTER — Telehealth: Payer: Self-pay | Admitting: Neurology

## 2014-06-01 ENCOUNTER — Other Ambulatory Visit: Payer: Self-pay | Admitting: Internal Medicine

## 2014-06-01 DIAGNOSIS — R921 Mammographic calcification found on diagnostic imaging of breast: Secondary | ICD-10-CM

## 2014-06-01 NOTE — Telephone Encounter (Signed)
Called patient's son to get patient scheduled per Dr. Trevor MaceAhern's instructions. Appointment is on 06/10/14.

## 2014-06-01 NOTE — Telephone Encounter (Signed)
Patient's son [name] calling to state that patient had another slight stroke and per Dr. Trevor MaceAhern's instructions, wanted to have patient come in to follow up in a week. Please call and advise.

## 2014-06-02 ENCOUNTER — Ambulatory Visit: Payer: Medicare Other | Admitting: Neurology

## 2014-06-10 ENCOUNTER — Encounter: Payer: Self-pay | Admitting: Neurology

## 2014-06-10 ENCOUNTER — Ambulatory Visit (INDEPENDENT_AMBULATORY_CARE_PROVIDER_SITE_OTHER): Payer: Medicare Other | Admitting: Neurology

## 2014-06-10 VITALS — BP 138/74 | HR 68 | Temp 98.7°F

## 2014-06-10 DIAGNOSIS — R4189 Other symptoms and signs involving cognitive functions and awareness: Secondary | ICD-10-CM

## 2014-06-10 DIAGNOSIS — E1140 Type 2 diabetes mellitus with diabetic neuropathy, unspecified: Secondary | ICD-10-CM | POA: Diagnosis not present

## 2014-06-10 DIAGNOSIS — I672 Cerebral atherosclerosis: Secondary | ICD-10-CM

## 2014-06-10 DIAGNOSIS — I639 Cerebral infarction, unspecified: Secondary | ICD-10-CM | POA: Diagnosis not present

## 2014-06-10 DIAGNOSIS — I2583 Coronary atherosclerosis due to lipid rich plaque: Secondary | ICD-10-CM

## 2014-06-10 DIAGNOSIS — E669 Obesity, unspecified: Secondary | ICD-10-CM | POA: Diagnosis not present

## 2014-06-10 DIAGNOSIS — I10 Essential (primary) hypertension: Secondary | ICD-10-CM

## 2014-06-10 DIAGNOSIS — I2510 Atherosclerotic heart disease of native coronary artery without angina pectoris: Secondary | ICD-10-CM

## 2014-06-10 NOTE — Progress Notes (Signed)
[facility]    Provider:  Dr Lucia GaskinsAhern Referring Provider: [name], Mahima, MD Primary Care Physician:  [name], MAHIMA, MD  CC:  stroke  HPI:  Abigail Brooks is a 79 y.o. female here as a follow up. She has multiple medical problems including HTN, DM, CAD s/p stenting, chronic congestive heart failure, MI, ischemic stroke 3 months ago with residual left sided weakness, [facility] due to acute onset  Left-sided weakness. Son accompanies mother and provides most of the history. That morning she took a shower, washed her hair, dried it and when she came out she seemed irritable moreson than usual. At 11:30 she leaned over and fell on her left and couldn't get up. Life alert called. Son went over and tried to get her to the bathroom and they thought she was just tired. They tried to put her in bed and she took 2 steps and they had to ease her down to the floor. He called the paramedics. Went to the hospital and the next day was completely better. EEG was normal. She was discharged on ASA and Plavix. She has baseline cognitive problems. Still having cognitive problems. forgetting peoples names, appointment, not getting lost but doesn't go out without anyone. Getting UTIs a lot and is following with Dr. Glade LloydPandey. She has had 2 stents, a heart attack which was years ago. First stroke was in July. Completely back to baseline now. On Lipitor. No falls. She denies HA, vertigo, double vision, difficulty swallowing, focal numbness, confusion, or vision changes.   November 1st keep her on it for 3 weeks. CN ok 5/5  Reviewed notes, labs and imaging from outside physicians, which showed:   Echo  Study Conclusions  - Procedure narrative: Transthoracic echocardiography. Image quality was adequate. The study was technically difficult. - Left ventricle: The cavity size was normal. Wall thickness was increased in a pattern of moderate LVH. Systolic function was normal. The  estimated ejection fraction was in the range of 55% to 60%. Wall motion was normal; there were no regional wall motion abnormalities. - Mitral valve: Calcified annulus. - Atrial septum: No defect or patent foramen ovale was identified. - Pericardium, extracardiac: A trivial pericardial effusion was identified posterior to the heart.  Personally reviewed images : MRI showed Acute on chronic right ACA territory ischemia. No mass effect or hemorrhage. 2. Occluded left ACA in the A2 segment. Widespread anterior circulation atherosclerosis, with moderate left MCA M1 and right supra clinoid ICA stenoses.   EEG normal LDL 58 HgbA1c 6.9   Review of Systems: Patient complains of symptoms per HPI as well as the following symptoms constipation, restless legs, insomnia, frequent waking, daytime sleepiness, snoring, sleep talking, frequent infections, incontinence of bladder, urgency, joint pain, back pain, walking difficulty, bruise/bleed easily, anemia, memory loss, dizziness, headache.. Pertinent negatives per HPI. All others negative.   History   Social History  . Marital Status: Divorced    Spouse Name: N/A    Number of Children: 3  . Years of Education: 8th   Occupational History  . retired Other   Social History Main Topics  . Smoking status: Former Smoker    Types: Cigarettes  . Smokeless tobacco: Current User    Types: Snuff     Comment: 40 years ago  . Alcohol Use: No  . Drug Use: No  . Sexual Activity: Not on file   Other Topics Concern  . Not on file   Social History Narrative   Patient  lives at home alone.   Caffeine use: none    Family History  Problem Relation Age of Onset  . Anesthesia problems Sister   . Anesthesia problems Son   . Thyroid cancer Son   . Heart disease Mother   . Diabetes Sister     Past Medical History  Diagnosis Date  . Coronary artery disease     stents x 2, sees Dr. Halina Andreas practice  . Congestive heart failure     2012  .  Myocardial infarction     "unsure of when"  . Hypertension   . Diabetes mellitus   . Shortness of breath   . Anemia   . Blood transfusion     when had hysterectomy  . Hypothyroidism     has had hx of treatment  . Urinary frequency   . Urinary tract infection     hx of  . GERD (gastroesophageal reflux disease)   . H/O hiatal hernia   . Arthritis   . Depression   . Spinal stenosis     with bone spurs and ruptured disc  . Cataract   . Blood transfusion without reported diagnosis   . Complications affecting other specified body systems, hypertension   . Other malaise and fatigue   . Dysuria   . Unspecified vitamin D deficiency   . Anxiety state, unspecified   . Other B-complex deficiencies   . Abnormality of gait   . Other and unspecified hyperlipidemia     Past Surgical History  Procedure Laterality Date  . Cataract extraction w/ intraocular lens  implant, bilateral    . Bladder suspension    . Coronary      stents x 2, stent implant placed9/21/06  . Back surgery      ruptured disc, lumbar area  . Appendectomy    . Cardiac catheterization    . Lumbar laminectomy/decompression microdiscectomy  12/21/2011    Procedure: LUMBAR LAMINECTOMY/DECOMPRESSION MICRODISCECTOMY 1 LEVEL;  Surgeon: Temple Pacini, MD;  Location: [facility];  Service: Neurosurgery;  Laterality: Bilateral;  Lumbar four-five decompressive lumbar laminectomy; Right Lumbar four-five microdiscectomy  . Eye surgery    . Spine surgery    . Abdominal hysterectomy  1986    Current Outpatient Prescriptions  Medication Sig Dispense Refill  . aspirin EC 81 MG EC tablet Take 1 tablet (81 mg total) by mouth daily.      Marland Kitchen atorvastatin (LIPITOR) 10 MG tablet Take 1 tablet (10 mg total) by mouth daily at 6 PM.  30 tablet  3  . Calcium Carbonate-Vitamin D (CALCIUM + D PO) Take 1 tablet by mouth daily. Calcium 315 mg,Vitamin D3 250 I.U.      . ciprofloxacin (CIPRO) 250 MG tablet Take 1 tablet (250 mg total) by mouth 2  (two) times daily.  10 tablet  0  . clonazePAM (KLONOPIN) 0.5 MG tablet Take 1 tablet (0.5 mg total) by mouth 2 (two) times daily.  30 tablet  0  . clopidogrel (PLAVIX) 75 MG tablet Take 1 tablet (75 mg total) by mouth daily.  30 tablet  3  . diclofenac sodium (VOLTAREN) 1 % GEL Apply 4 g topically 2 (two) times daily as needed (for knee pain).      . fenofibrate 54 MG tablet Take 54 mg by mouth daily.      . ferrous sulfate 325 (65 FE) MG tablet Take 325 mg by mouth daily with breakfast.      . fesoterodine (TOVIAZ) 4  MG TB24 tablet Take 4 mg by mouth daily.      . fish oil-omega-3 fatty acids 1000 MG capsule Take 1 g by mouth daily.       . hydrALAZINE (APRESOLINE) 25 MG tablet Take 25 mg by mouth 3 (three) times daily.      Marland Kitchen losartan-hydrochlorothiazide (HYZAAR) 50-12.5 MG per tablet Take 1 tablet by mouth daily.      . metFORMIN (GLUCOPHAGE) 500 MG tablet Take 1 tablet (500 mg total) by mouth daily with breakfast.  90 tablet  3  . metoprolol (LOPRESSOR) 100 MG tablet Take 1 tablet (100 mg total) by mouth 2 (two) times daily.  60 tablet  5  . nitroGLYCERIN (NITROSTAT) 0.4 MG SL tablet Place 0.4 mg under the tongue every 5 (five) minutes as needed for chest pain. Max 3 tablets in 15 minutes      . oxyCODONE-acetaminophen (PERCOCET/ROXICET) 5-325 MG per tablet Take 1-2 tablets by mouth every 8 (eight) hours as needed for severe pain (pain).  90 tablet  0  . phenazopyridine (PYRIDIUM) 100 MG tablet Take 1 tablet (100 mg total) by mouth 3 (three) times daily as needed for pain.  10 tablet  0  . polyethylene glycol (MIRALAX / GLYCOLAX) packet Take 17 g by mouth daily as needed (pain).      . QUEtiapine (SEROQUEL) 25 MG tablet Take 25-50 mg by mouth 2 (two) times daily. Take 1 tablet every morning and take 2 tablets every evening      . senna-docusate (SENNA S) 8.6-50 MG per tablet Take 1 tablet by mouth 2 (two) times daily.      . traZODone (DESYREL) 50 MG tablet Take 1 tablet by mouth at bedtime as  needed and may repeat dose one time if needed.      . zoster vaccine live, PF, (ZOSTAVAX) 16109 UNT/0.65ML injection Inject 19,400 Units into the skin once.  1 each  0  . [DISCONTINUED] oxybutynin (DITROPAN-XL) 5 MG 24 hr tablet Take one tablet by mouth once daily for bladder.       No current facility-administered medications for this visit.    Allergies as of 06/10/2014 - Review Complete 06/10/2014  Allergen Reaction Noted  . Morphine and related Other (See Comments) 02/21/2014  . Enalapril Swelling 02/13/2013    Vitals: BP 138/74  Pulse 68  Temp(Src) 98.7 F (37.1 C) (Oral) Last Weight:  Wt Readings from Last 1 Encounters:  05/30/14 188 lb 8 oz (85.503 kg)   Last Height:   Ht Readings from Last 1 Encounters:  05/29/14 5' (1.524 m)   PHYSICAL EXAM  Physical exam:  Exam:  Gen: NAD  Eyes: anicteric sclerae, moist conjunctivae  CV: RRR, no MRG  Mental Status: Alert, following commands, some difficulty with spelling and addition, doesn't remember seeing me in the hospital Neuro:  Detailed Neurologic Exam  Speech:  fluent  Cranial Nerves:  The pupils are equal, round, and reactive to light. Face symmetric. VFF full. Tongue midline  Motor Observation:  no involuntary movements noted.  Strength:  Strength intact, no drift  Coordination: No dysmetria Gait and Station: Arises from chair with difficulty. Stance is wide based slightly. Uses a walker and does not have it today, is in a wheelchair. Reflexes: 1+ and symmetric. Toes downgoing.  Sensory: decreased in a glove and stocking distribution    Assessment/Plan:  NIKEYA MAXIM is an 79 y.o. female with multiple medical problems including HTN, DM, CAD s/p stenting, chronic congestive heart failure,  MI, ischemic stroke 3 months ago with residual left sided weakness. Here again with acute onset left-sided weakness. MRI showed acute on chronic right ACA territory ischemia. No mass effect or hemorrhage. 2. Occluded left ACA  in the A2 segment. Widespread anterior circulation atherosclerosis, with moderate left MCA M1 and right supra clinoid ICA stenoses. Symptoms resolved. She has diabetic neuropathy, cognitive impairment.   -Will continue ASA and plavix for 3 weeks (until Nov 1st) then just plavix - continue Lipitor - Tight glucose control, follow with PCP, watch weight and diet as HgbA1c is 6.9 - tight blood pressure management. hypertension with blood pressure goal 120/80 -Patient counseled to be compliant with her antithrombotic medications  -Ongoing aggressive risk factor management - B12 321 in march, RPR negative. TSH is normal.    Naomie DeanAntonia Kherington Meraz, MD  [facility] [address] [place], [place] [id number]  Phone [phone] Fax [phone]

## 2014-06-10 NOTE — Patient Instructions (Signed)
Overall you are doing fairly well but I do want to suggest a few things today:   Remember to drink plenty of fluid, eat healthy meals and do not skip any meals. Try to eat protein with a every meal and eat a healthy snack such as fruit or nuts in between meals. Try to keep a regular sleep-wake schedule and try to exercise daily, particularly in the form of walking, 20-30 minutes a day, if you can.   As far as your medications are concerned, I would like to suggest: continue ASA and Plavix.   I would like to see you back in January with Dr. Pearlean BrownieSethi, sooner if we need to. Please call us with any interim questions, concerns, problems, updates or refill requests.   Please also call us for any test results so we can go over those with you on the phone.  My clinical assistant and will answer any of your questions and relay your messages to me and also relay most of my messages to you.   Our phone number is [phone]. We also have an after hours call service for urgent matters and there is a physician on-call for urgent questions. For any emergencies you know to call 911 or go to the nearest emergency room

## 2014-06-15 ENCOUNTER — Ambulatory Visit
Admission: RE | Admit: 2014-06-15 | Discharge: 2014-06-15 | Disposition: A | Discharge status: Home / Self Care | Payer: Medicare Other | Source: Ambulatory Visit | Attending: Internal Medicine | Admitting: Internal Medicine

## 2014-06-15 DIAGNOSIS — R921 Mammographic calcification found on diagnostic imaging of breast: Secondary | ICD-10-CM | POA: Diagnosis not present

## 2014-06-16 ENCOUNTER — Encounter: Payer: Self-pay | Admitting: Gastroenterology

## 2014-06-16 ENCOUNTER — Encounter: Payer: Self-pay | Admitting: Internal Medicine

## 2014-06-16 ENCOUNTER — Ambulatory Visit (INDEPENDENT_AMBULATORY_CARE_PROVIDER_SITE_OTHER): Payer: Medicare Other | Admitting: Internal Medicine

## 2014-06-16 VITALS — BP 130/70 | HR 65 | Temp 98.8°F | Ht 60.0 in | Wt 197.6 lb

## 2014-06-16 DIAGNOSIS — I25119 Atherosclerotic heart disease of native coronary artery with unspecified angina pectoris: Secondary | ICD-10-CM

## 2014-06-16 DIAGNOSIS — R413 Other amnesia: Secondary | ICD-10-CM | POA: Diagnosis not present

## 2014-06-16 DIAGNOSIS — N3090 Cystitis, unspecified without hematuria: Secondary | ICD-10-CM | POA: Diagnosis not present

## 2014-06-16 DIAGNOSIS — I10 Essential (primary) hypertension: Secondary | ICD-10-CM

## 2014-06-16 DIAGNOSIS — M48062 Spinal stenosis, lumbar region with neurogenic claudication: Secondary | ICD-10-CM

## 2014-06-16 DIAGNOSIS — Z87440 Personal history of urinary (tract) infections: Secondary | ICD-10-CM

## 2014-06-16 DIAGNOSIS — M4806 Spinal stenosis, lumbar region: Secondary | ICD-10-CM

## 2014-06-16 DIAGNOSIS — I639 Cerebral infarction, unspecified: Secondary | ICD-10-CM | POA: Diagnosis not present

## 2014-06-16 DIAGNOSIS — E1129 Type 2 diabetes mellitus with other diabetic kidney complication: Secondary | ICD-10-CM

## 2014-06-16 DIAGNOSIS — N058 Unspecified nephritic syndrome with other morphologic changes: Secondary | ICD-10-CM

## 2014-06-16 LAB — POCT URINALYSIS DIPSTICK
BILIRUBIN UA: NEGATIVE
GLUCOSE UA: 100
Ketones, UA: NEGATIVE
Nitrite, UA: NEGATIVE
Spec Grav, UA: 1.015
UROBILINOGEN UA: 0.2
pH, UA: 6

## 2014-06-16 NOTE — Progress Notes (Signed)
Patient ID: Abigail Brooks, female   DOB: [birth year], 79 y.o.   MRN: [id number]    Chief Complaint  Patient presents with  . Hospitalization Follow-up    Hospital follow up/CB   Allergies  Allergen Reactions  . Morphine And Related Other (See Comments)    Makes her crazy  . Enalapril Swelling    Facial swelling   HPI 79 y/o female pt here for hospital follow up. She was in the hospital from 05/29/14-05/30/14 with weakness and dysuria. Ct head showed no acute intracranial abnormalities. MRI brain showed acute on chronic right ACA territory ischemia. Echocardiogram showed normal EF and no PFO. Carotid doppler was normal. Neurology was consulted and recommended no changes. She is on aspirin, plavix and statin. She is here with her son. No residual weakness. Denies any concerns today. She was treated for uti in the hospital as well.   ROS No fever or chills No nausea, vomiting, abdominal pain No flank pain. Has dysuria, increased frequency and would like her urine checked No headache, focal weakness, dizziness No behavioral changes No chest pain, dyspnea, palpitations  Past Medical History  Diagnosis Date  . Coronary artery disease     stents x 2, sees Dr. [facility]ina [facility]  . Congestive heart failure     2012  . Myocardial infarction     "unsure of when"  . Hypertension   . Diabetes mellitus   . Shortness of breath   . Anemia   . Blood transfusion     when had hysterectomy  . Hypothyroidism     has had hx of treatment  . Urinary frequency   . Urinary tract infection     hx of  . GERD (gastroesophageal reflux disease)   . H/O hiatal hernia   . Arthritis   . Depression   . Spinal stenosis     with bone spurs and ruptured disc  . Cataract   . Blood transfusion without reported diagnosis   . Complications affecting other specified body systems, hypertension   . Other malaise and fatigue   . Dysuria   . Unspecified vitamin D deficiency   . Anxiety state,  unspecified   . Other B-complex deficiencies   . Abnormality of gait   . Other and unspecified hyperlipidemia    Medication reviewed. See [facility]  Past Surgical History  Procedure Laterality Date  . Cataract extraction w/ intraocular lens  implant, bilateral    . Bladder suspension    . Coronary      stents x 2, stent implant placed9/21/06  . Back surgery      ruptured disc, lumbar area  . Appendectomy    . Cardiac catheterization    . Lumbar laminectomy/decompression microdiscectomy  12/21/2011    Procedure: LUMBAR LAMINECTOMY/DECOMPRESSION MICRODISCECTOMY 1 LEVEL;  Surgeon: Temple Pacini, MD;  Location: [facility];  Service: Neurosurgery;  Laterality: Bilateral;  Lumbar four-five decompressive lumbar laminectomy; Right Lumbar four-five microdiscectomy  . Eye surgery    . Spine surgery    . Abdominal hysterectomy  1986    Physical exam BP 130/70 mmHg  Pulse 65  Temp(Src) 98.8 F (37.1 C) (Oral)  Ht 5' (1.524 m)  Wt 197 lb 9.6 oz (89.631 kg)  BMI 38.59 kg/m2  SpO2 93%  General- elderly female in no acute distress, obese Head- atraumatic, normocephalic Neck- no lymphadenopathy Mouth- normal mucus membrane Cardiovascular- normal s1,s2 Respiratory- bilateral clear to auscultation, no wheeze, no rhonchi, no crackles Abdomen- bowel sounds present,  soft, non tender Musculoskeletal- able to move all 4 extremities, trace leg edema, on walker, wide based gait Neurological- alert, no focal deficit Skin- warm and dry Psychiatry- has dementia  Imaging Ct Head (brain) Wo Contrast  05/29/2014   CLINICAL DATA:  Code stroke.  Left-sided weakness.  EXAM: CT HEAD WITHOUT CONTRAST  TECHNIQUE: Contiguous axial images were obtained from the base of the skull through the vertex without intravenous contrast.  COMPARISON:  CT of the head performed 04/02/2014  FINDINGS: There is no evidence of acute infarction, mass lesion, or intra- or extra-axial hemorrhage on CT.  Mild periventricular white  matter change likely reflects small vessel ischemic microangiopathy. Prominence of the ventricles and sulci suggests mild cortical volume loss. Mild cerebellar atrophy is noted.  The brainstem and fourth ventricle are within normal limits. The basal ganglia are unremarkable in appearance. The cerebral hemispheres demonstrate grossly normal gray-white differentiation. No mass effect or midline shift is seen.  There is no evidence of fracture; visualized osseous structures are unremarkable in appearance. The orbits are within normal limits. The paranasal sinuses and mastoid air cells are well-aerated. No significant soft tissue abnormalities are seen.  IMPRESSION: 1. No acute intracranial pathology seen on CT. 2. Mild cortical volume loss and scattered small vessel ischemic microangiopathy.  These results were called by telephone at the time of interpretation on 05/29/2014 at 1:44 am to Dr. Marisa SeverinLGA OTTER , who verbally acknowledged these results.  Electronically Signed   By: Roanna RaiderJeffery  Chang M.D.   On: 05/29/2014 01:45   Mr Shirlee LatchMra Head ZOWo Contrast  05/29/2014   ADDENDUM REPORT: 05/29/2014 12:26  ADDENDUM: Study discussed by telephone with on Delton Seeavid Rinehuls, PAC on 05/29/2014 at 1218 hrs. Also note:  CORRECTION:  Impression #2 should read  " 2.  Occluded LEFT ACA in the A2 segment.  "   Electronically Signed   By: Augusto GambleLee  Hall M.D.   On: 05/29/2014 12:26   05/29/2014   CLINICAL DATA:  79 year old female with current history of stroke 3 months ago with residual left-sided weakness. Episode of left side weakness exacerbation associated with acute headache. Initial encounter.  EXAM: MRI HEAD WITHOUT CONTRAST  MRA HEAD WITHOUT CONTRAST  TECHNIQUE: Multiplanar, multiecho pulse sequences of the brain and surrounding structures were obtained without intravenous contrast. Angiographic images of the head were obtained using MRA technique without contrast.  COMPARISON:  Brain MRI and MRA 02/22/2014, and earlier.  FINDINGS: MRI HEAD  FINDINGS  Major intracranial vascular flow voids are stable. Gyriform restricted diffusion in the right pericallosal sulcus Peri cingulate sulcus. Series 8, image 16. Small foci of associated subcortical white matter involvement. Minimal T2 and FLAIR hyperintensity at this time. Nearby chronic subcortical lacunar infarct (series 7, image 18), new compared to July. No associated mass effect or hemorrhage.  No contralateral left hemisphere or posterior fossa restricted diffusion. Patchy periventricular white matter T2 and FLAIR hyperintensity elsewhere is stable.  No midline shift, mass effect, evidence of mass lesion, ventriculomegaly, extra-axial collection or acute intracranial hemorrhage. Cervicomedullary junction and pituitary are within normal limits. Negative visualized cervical spine. Normal bone marrow signal. Visible internal auditory structures appear normal. Visualized paranasal sinuses and mastoids are clear. Stable postoperative appearance of the globes. Visualized scalp soft tissues are within normal limits.  MRA HEAD FINDINGS  Less motion artifact today.  Stable antegrade flow in the posterior circulation with codominant distal vertebral arteries and normal PICA origins. Normal vertebrobasilar junction, basilar artery, SCA and PCA origins. Normal left posterior communicating artery,  the right is diminutive or absent. Bilateral PCA branches are within normal limits.  Stable antegrade flow in both ICA siphons. Cavernous segment irregularity on the left re - identified and likely atherosclerotic pseudo lesion (series 6, image 84), stable. No left siphon stenosis. There is moderate supra clinoid stenosis distal to the ophthalmic artery origin, stable. Ophthalmic and left posterior communicating artery origins are within normal limits. Patent carotid termini.  The left ACA A1 segment appears dominant, but the right is irregular and diminutive. There is occlusion of the right ACA in A2 segment (series 6,  image 153), with no distal flow signal identified. The visualized left ACA remains patent.  Patent MCA origins. Moderate left greater than right M1 segment irregularity. Moderate left M1 stenosis, with preserved distal flow. Bilateral MCA bifurcations remain patent. No major MCA branch occlusion identified.  IMPRESSION: 1. Acute on chronic right ACA territory ischemia. No mass effect or hemorrhage. 2. Occluded left ACA in the A2 segment. 3. Widespread anterior circulation atherosclerosis, with moderate left MCA M1 and right supra clinoid ICA stenoses. 4. Negative posterior circulation.  Electronically Signed: By: Augusto GambleLee  Hall M.D. On: 05/29/2014 11:50   Mr Brain Wo Contrast  05/29/2014   ADDENDUM REPORT: 05/29/2014 12:26  ADDENDUM: Study discussed by telephone with on Delton Seeavid Rinehuls, PAC on 05/29/2014 at 1218 hrs. Also note:  CORRECTION:  Impression #2 should read  " 2.  Occluded LEFT ACA in the A2 segment.  "   Electronically Signed   By: Augusto GambleLee  Hall M.D.   On: 05/29/2014 12:26   05/29/2014   CLINICAL DATA:  79 year old female with current history of stroke 3 months ago with residual left-sided weakness. Episode of left side weakness exacerbation associated with acute headache. Initial encounter.  EXAM: MRI HEAD WITHOUT CONTRAST  MRA HEAD WITHOUT CONTRAST  TECHNIQUE: Multiplanar, multiecho pulse sequences of the brain and surrounding structures were obtained without intravenous contrast. Angiographic images of the head were obtained using MRA technique without contrast.  COMPARISON:  Brain MRI and MRA 02/22/2014, and earlier.  FINDINGS: MRI HEAD FINDINGS  Major intracranial vascular flow voids are stable. Gyriform restricted diffusion in the right pericallosal sulcus Peri cingulate sulcus. Series 8, image 16. Small foci of associated subcortical white matter involvement. Minimal T2 and FLAIR hyperintensity at this time. Nearby chronic subcortical lacunar infarct (series 7, image 18), new compared to July. No  associated mass effect or hemorrhage.  No contralateral left hemisphere or posterior fossa restricted diffusion. Patchy periventricular white matter T2 and FLAIR hyperintensity elsewhere is stable.  No midline shift, mass effect, evidence of mass lesion, ventriculomegaly, extra-axial collection or acute intracranial hemorrhage. Cervicomedullary junction and pituitary are within normal limits. Negative visualized cervical spine. Normal bone marrow signal. Visible internal auditory structures appear normal. Visualized paranasal sinuses and mastoids are clear. Stable postoperative appearance of the globes. Visualized scalp soft tissues are within normal limits.  MRA HEAD FINDINGS  Less motion artifact today.  Stable antegrade flow in the posterior circulation with codominant distal vertebral arteries and normal PICA origins. Normal vertebrobasilar junction, basilar artery, SCA and PCA origins. Normal left posterior communicating artery, the right is diminutive or absent. Bilateral PCA branches are within normal limits.  Stable antegrade flow in both ICA siphons. Cavernous segment irregularity on the left re - identified and likely atherosclerotic pseudo lesion (series 6, image 84), stable. No left siphon stenosis. There is moderate supra clinoid stenosis distal to the ophthalmic artery origin, stable. Ophthalmic and left posterior communicating artery origins are  within normal limits. Patent carotid termini.  The left ACA A1 segment appears dominant, but the right is irregular and diminutive. There is occlusion of the right ACA in A2 segment (series 6, image 153), with no distal flow signal identified. The visualized left ACA remains patent.  Patent MCA origins. Moderate left greater than right M1 segment irregularity. Moderate left M1 stenosis, with preserved distal flow. Bilateral MCA bifurcations remain patent. No major MCA branch occlusion identified.  IMPRESSION: 1. Acute on chronic right ACA territory ischemia.  No mass effect or hemorrhage. 2. Occluded left ACA in the A2 segment. 3. Widespread anterior circulation atherosclerosis, with moderate left MCA M1 and right supra clinoid ICA stenoses. 4. Negative posterior circulation.  Electronically Signed: By: Augusto Gamble M.D. On: 05/29/2014 11:50    Assessment/plan  1. History of recurrent UTIs - POCT urinalysis dipstick - Culture, Urine  2. Cystitis Has completed a course of antibiotics recently. Will check urine culture. Hold off on antibiotics for now. With her recurrent cystitis, refer to urology. Continue prn pyridium - Culture, Urine  3. Acute on chronic CVA Neurologically stable. Continue aspirin, plavix and statin. reviewed recent neurology notes. Plan is to continue asa and plavix until 06/20/14 and then only plavix.  4. HTN bp stable this visit. Continue hydralazine, hyzaar, lopressor  5. Chronic Lumbar Stenosis with neurogenic claudication   Continue narcotics    6. Diabetes mellitus, type II   Continue metofrmin and monitor cbg. On asa, losartan and statin. Hemoglobin A1c 6.9  7. Coronary Artery Disease   Chest pain free. Continue aspirin, metoprolol and Plavix -Patient is chest pain free    8. Dementia  Stable, continue seroquel

## 2014-06-18 ENCOUNTER — Other Ambulatory Visit: Payer: Self-pay | Admitting: *Deleted

## 2014-06-18 LAB — URINE CULTURE

## 2014-06-18 MED ORDER — SACCHAROMYCES BOULARDII 250 MG PO CAPS: ORAL_CAPSULE | ORAL | Status: DC

## 2014-06-18 MED ORDER — NITROFURANTOIN MACROCRYSTAL 100 MG PO CAPS: ORAL_CAPSULE | ORAL | Status: DC

## 2014-06-18 NOTE — Telephone Encounter (Signed)
Dr. Glade LloydPandey called and stated that patient has a UTI and needs Antibiotic called in. Contacted son and called in Rx's to [facility].

## 2014-07-12 ENCOUNTER — Encounter: Payer: Self-pay | Admitting: Gastroenterology

## 2014-07-12 ENCOUNTER — Telehealth: Payer: Self-pay | Admitting: Neurology

## 2014-07-12 ENCOUNTER — Ambulatory Visit (INDEPENDENT_AMBULATORY_CARE_PROVIDER_SITE_OTHER): Payer: Medicare Other | Admitting: Gastroenterology

## 2014-07-12 ENCOUNTER — Telehealth: Payer: Self-pay | Admitting: *Deleted

## 2014-07-12 VITALS — BP 134/62 | HR 60 | Ht 60.0 in | Wt 194.0 lb

## 2014-07-12 DIAGNOSIS — I639 Cerebral infarction, unspecified: Secondary | ICD-10-CM | POA: Diagnosis not present

## 2014-07-12 DIAGNOSIS — R195 Other fecal abnormalities: Secondary | ICD-10-CM | POA: Diagnosis not present

## 2014-07-12 DIAGNOSIS — N3020 Other chronic cystitis without hematuria: Secondary | ICD-10-CM | POA: Diagnosis not present

## 2014-07-12 DIAGNOSIS — K921 Melena: Secondary | ICD-10-CM

## 2014-07-12 MED ORDER — MOVIPREP 100 G PO SOLR
1.0000 | Freq: Once | ORAL | Status: DC
Start: 2014-07-12 — End: 2014-11-18

## 2014-07-12 NOTE — Telephone Encounter (Signed)
07/12/2014   RE: Abigail Brooks DOB: Feb 14, 1935 MRN: [id number]   Dear Dr. Glade LloydPandey,    We have scheduled the above patient for an Colonoscopy. Our records show that she is on anticoagulation therapy.   Please advise as to how long the patient may come off her therapy of Plavix prior to the procedure, which is scheduled for 08-26-2014.  Patient had a stroke on 05-28-2014, I contacted Dr. Judieth KeensAhren with Neurology was told to send clearance letter to you because you are in charge of the decision of stopping the Plavix.  Please route the completed form to [employer] Hughes IncorporatedKelly S.   Sincerely,    Ok AnisKelly Carlia Bomkamp

## 2014-07-12 NOTE — Progress Notes (Signed)
Reviewed and agree with management plan. If Plavix cannot be safely held for colonoscopy in January, would proceed with an ACBE on Plavix.  Venita LickMalcolm T. Russella DarStark, MD [facility]

## 2014-07-12 NOTE — Patient Instructions (Signed)
You have been scheduled for a colonoscopy at [facility]. Please follow written instructions given to you at your visit today.  Please pick up your prep kit at the pharmacy within the next 1-3 days. If you use inhalers (even only as needed), please bring them with you on the day of your procedure. Your physician has requested that you go to www.startemmi.com and enter the access code given to you at your visit today. This web site gives a general overview about your procedure. However, you should still follow specific instructions given to you by our office regarding your preparation for the procedure.  You will be contacted by our office prior to your procedure for directions on holding your Plavix.  If you do not hear from our office 1 week prior to your scheduled procedure, please call [phone] to discuss.

## 2014-07-12 NOTE — Progress Notes (Signed)
07/12/2014 Abigail Brooks [id number] 07-05-1935   HISTORY OF PRESENT ILLNESS:  This is a 79 year old female who presents to our office today with her son at the request of her PCP, Dr. Glade LloydPandey, to discuss colonoscopy for evaluation of heme positive stool/hemosure.  She does admit to some constipation and straining with occasional bright red blood; she said that she actually did see some blood when she did the test as well.  She uses Miralax prn and Senn-S twice daily for constipation.  Never had colonoscopy in the past.  Denies any abdominal pain, weight loss, or loss of appetite.  She has multiple medical problems including a stroke on 05/28/2014 and is on Plavix; no residual neurologic issues.  Patent is nervous to have colonoscopy; her son is very adamant that she have it performed.  Normal Hgb at 13.0 grams just last month; she has been on ferrous sulfate 325 mg daily for the past year or more.   Past Medical History  Diagnosis Date  . Coronary artery disease     stents x 2, sees Dr. Halina AndreasSmith, [facility]  . Congestive heart failure     2012  . Myocardial infarction     "unsure of when"  . Hypertension   . Diabetes mellitus   . Shortness of breath   . Anemia   . Blood transfusion     when had hysterectomy  . Hypothyroidism     has had hx of treatment  . Urinary frequency   . Urinary tract infection     hx of  . GERD (gastroesophageal reflux disease)   . H/O hiatal hernia   . Arthritis   . Depression   . Spinal stenosis     with bone spurs and ruptured disc  . Cataract   . Blood transfusion without reported diagnosis   . Complications affecting other specified body systems, hypertension   . Other malaise and fatigue   . Dysuria   . Unspecified vitamin D deficiency   . Anxiety state, unspecified   . Other B-complex deficiencies   . Abnormality of gait   . Other and unspecified hyperlipidemia    Past Surgical History  Procedure Laterality Date  . Cataract  extraction w/ intraocular lens  implant, bilateral    . Bladder suspension    . Coronary      stents x 2, stent implant placed9/21/06  . Back surgery      ruptured disc, lumbar area  . Appendectomy    . Cardiac catheterization    . Lumbar laminectomy/decompression microdiscectomy  12/21/2011    Procedure: LUMBAR LAMINECTOMY/DECOMPRESSION MICRODISCECTOMY 1 LEVEL;  Surgeon: Temple PaciniHenry A Pool, MD;  Location: [facility];  Service: Neurosurgery;  Laterality: Bilateral;  Lumbar four-five decompressive lumbar laminectomy; Right Lumbar four-five microdiscectomy  . Eye surgery    . Spine surgery    . Abdominal hysterectomy  1986    reports that she has quit smoking. Her smoking use included Cigarettes. She smoked 0.00 packs per day. Her smokeless tobacco use includes Snuff. She reports that she does not drink alcohol or use illicit drugs. family history includes Anesthesia problems in her sister and son; Diabetes in her sister; Heart disease in her mother; Thyroid cancer in her son. Allergies  Allergen Reactions  . Morphine And Related Other (See Comments)    Makes her crazy  . Enalapril Swelling    Facial swelling      Outpatient Encounter Prescriptions as of 07/12/2014  Medication Sig  . atorvastatin (LIPITOR) 10 MG tablet Take 1 tablet (10 mg total) by mouth daily at 6 PM.  . Calcium Carbonate-Vitamin D (CALCIUM + D PO) Take 1 tablet by mouth daily. Calcium 315 mg,Vitamin D3 250 I.U.  . clonazePAM (KLONOPIN) 0.5 MG tablet Take 1 tablet (0.5 mg total) by mouth 2 (two) times daily.  . clopidogrel (PLAVIX) 75 MG tablet Take 1 tablet (75 mg total) by mouth daily.  . diclofenac sodium (VOLTAREN) 1 % GEL Apply 4 g topically 2 (two) times daily as needed (for knee pain).  . fenofibrate 54 MG tablet Take 54 mg by mouth daily.  . ferrous sulfate 325 (65 FE) MG tablet Take 325 mg by mouth daily with breakfast.  . fesoterodine (TOVIAZ) 4 MG TB24 tablet Take 4 mg by mouth daily.  . fish oil-omega-3 fatty  acids 1000 MG capsule Take 1 g by mouth daily.   . hydrALAZINE (APRESOLINE) 25 MG tablet Take 25 mg by mouth 3 (three) times daily.  Marland Kitchen. losartan-hydrochlorothiazide (HYZAAR) 50-12.5 MG per tablet Take 1 tablet by mouth daily.  . metFORMIN (GLUCOPHAGE) 500 MG tablet Take 1 tablet (500 mg total) by mouth daily with breakfast.  . metoprolol (LOPRESSOR) 100 MG tablet Take 1 tablet (100 mg total) by mouth 2 (two) times daily.  . nitrofurantoin (MACRODANTIN) 100 MG capsule Take one tablet by mouth twice daily for 2 weeks  . nitroGLYCERIN (NITROSTAT) 0.4 MG SL tablet Place 0.4 mg under the tongue every 5 (five) minutes as needed for chest pain. Max 3 tablets in 15 minutes  . oxyCODONE-acetaminophen (PERCOCET/ROXICET) 5-325 MG per tablet Take 1-2 tablets by mouth every 8 (eight) hours as needed for severe pain (pain).  . phenazopyridine (PYRIDIUM) 100 MG tablet Take 1 tablet (100 mg total) by mouth 3 (three) times daily as needed for pain.  . polyethylene glycol (MIRALAX / GLYCOLAX) packet Take 17 g by mouth daily as needed (pain).  . QUEtiapine (SEROQUEL) 25 MG tablet Take 25-50 mg by mouth 2 (two) times daily. Take 1 tablet every morning and take 2 tablets every evening  . saccharomyces boulardii (FLORASTOR) 250 MG capsule Take one tablet by mouth twice daily for 30 days  . senna-docusate (SENNA S) 8.6-50 MG per tablet Take 1 tablet by mouth 2 (two) times daily.  . traZODone (DESYREL) 50 MG tablet Take 1 tablet by mouth at bedtime as needed and may repeat dose one time if needed.  . zoster vaccine live, PF, (ZOSTAVAX) [id number] UNT/0.65ML injection Inject 19,400 Units into the skin once.  . [DISCONTINUED] aspirin EC 81 MG EC tablet Take 1 tablet (81 mg total) by mouth daily.     REVIEW OF SYSTEMS  : All other systems reviewed and negative except where noted in the History of Present Illness.   PHYSICAL EXAM: BP 134/62 mmHg  Pulse 60  Ht 5' (1.524 m)  Wt 194 lb (87.998 kg)  BMI 37.89 kg/m2 General:  Well developed white female in no acute distress; in wheelchair, has a lot of trouble with ambulation. Head: Normocephalic and atraumatic Eyes:  Sclerae anicteric, conjunctiva pink. Ears: Normal auditory acuity Lungs: Clear throughout to auscultation Heart: Regular rate and rhythm Abdomen: Soft, non-distended.  Normal bowel sounds.  Non-tender. Musculoskeletal: Symmetrical with no gross deformities  Skin: No lesions on visible extremities Extremities: No edema  Neurological: Alert oriented x 4, grossly non-focal Psychological:  Alert and cooperative. Normal mood and affect  ASSESSMENT AND PLAN: -Positive IFOB:  Patient never had  colonoscopy.  She has history of hemorrhoids and had constipation with straining and some bright red blood at the time of the IFOB test.  Her son is very adamant that she have the colonoscopy performed.  The risks, benefits, and alternatives were discussed with the patient and she consents to proceed.  -Recent CVA in October, now on Plavix:  We will schedule procedure out 3 months from the date of her stroke and discuss with her PCP/neurology regarding holding the Plavix at that point.  The risks benefits and alternatives to a temporary hold of anti-coagulants/anti-platelets for the procedure, including risk or recurrent stroke, were discussed with the patient she consents to proceed. Obtain clearance from Dr. Pandey/neurology regarding procedure and hold Plavix prior to procedure.

## 2014-07-13 ENCOUNTER — Other Ambulatory Visit: Payer: Self-pay | Admitting: Internal Medicine

## 2014-07-14 ENCOUNTER — Telehealth: Payer: Self-pay | Admitting: Internal Medicine

## 2014-07-14 NOTE — Telephone Encounter (Signed)
Dear Dr. Lucia GaskinsAhern,   I contacted your office early this week to discuss patient holding Plavix. I was told by your nurse per Dr. Lucia GaskinsAhern we need to contact patients PCP. I contacted PCP, note is below. Dr. Glade LloydPandey answered, she would like your input as well on stopping Plavix because patient was seen recent in your office after her stroke. Dr. Volney PresserPandey's note is below, please advise.

## 2014-07-14 NOTE — Telephone Encounter (Signed)
Dear Abigail EndoKelly,  Ms Rodena MedinHodgin had a recent stroke which puts her at high risk for another stroke if we stop her plavix. Colonoscopy as such is a low risk procedure but if a polyp is found on exam, she will need a polypectomy and being on plavix will increase the risk of bleeding. I would appreciate neurology Dr Yvonna AlanisAhren's input on this. I would have patient hold plavix 5 days prior to the procedure.   Do let me know if you have any questions.  Thank You  Oneal GroutMahima Barry Culverhouse MD

## 2014-07-14 NOTE — Telephone Encounter (Signed)
Spoke with Dr Lucia GaskinsAhern and appreciate her input.   Ms Abigail Brooks has history of CVA and also had a recent stroke which puts her at high risk for another stroke if we stop her plavix. Dr Lucia GaskinsAhern recommends putting her on aspirin 325 mg daily with plavix on hold 5 days prior to procedure.

## 2014-07-14 NOTE — Telephone Encounter (Signed)
I spoke to Dr. Glade LloydPandey today and we agreed that patient has significant risk of repeat stroke given her widespread anterior circulation atherosclerosis. Recently had acute on chronic ACA territory ischemic event. Dr. Glade LloydPandey and I would strongly prefer to keep her on the Plavix but if the bleeding risk with polypectomy is severe, would keep her on ASA 325 at a minimum. Would discuss the risks of stroke vs bleeding to patient and her son (who cares for her) and ask for their input on this matter. They can also call me directly if they would like to discuss. Please don't hesitate to call me with any questions, I'm very sorry you were told to contact patient's PCP when you called my office, I was not aware. I am always available for any questions whatsoever. Thank you, Artemio Alyoni Vertis Bauder MD

## 2014-07-16 NOTE — Telephone Encounter (Signed)
Note regarding risk of stroke reviewed. Risk off Plavix is significant. I recommend an ACBE, without holding Plavix, instead of colonoscopy. Please cancel colonoscopy.

## 2014-07-19 NOTE — Telephone Encounter (Signed)
I spoke with her son [name].  Patient's colonoscopy is cancelled and I have scheduled her for a AC[facility] at [facility] on 08/09/14 9:30.  Her son is advised to follow the same prep instructions for the BE.  He verbalized understanding

## 2014-07-19 NOTE — Addendum Note (Signed)
Addended by: Annett FabianJONES, Lyndee Herbst L on: 07/19/2014 10:24 AM   Modules accepted: Orders

## 2014-07-19 NOTE — Telephone Encounter (Signed)
See phone note

## 2014-07-26 ENCOUNTER — Telehealth: Payer: Self-pay

## 2014-07-26 NOTE — Telephone Encounter (Signed)
Mailing HCPOA with a letter back to the patient.  The forms submitted for scanning were deemed not valid.

## 2014-08-02 NOTE — Telephone Encounter (Signed)
Error-no note

## 2014-08-04 ENCOUNTER — Ambulatory Visit: Payer: Self-pay | Admitting: Internal Medicine

## 2014-08-09 ENCOUNTER — Other Ambulatory Visit: Payer: Self-pay | Admitting: Gastroenterology

## 2014-08-09 ENCOUNTER — Other Ambulatory Visit: Payer: Medicare Other

## 2014-08-09 ENCOUNTER — Ambulatory Visit (HOSPITAL_COMMUNITY)
Admission: RE | Admit: 2014-08-09 | Discharge: 2014-08-09 | Disposition: A | Discharge status: Home / Self Care | Payer: Medicare Other | Source: Ambulatory Visit | Attending: Gastroenterology | Admitting: Gastroenterology

## 2014-08-09 DIAGNOSIS — N058 Unspecified nephritic syndrome with other morphologic changes: Secondary | ICD-10-CM | POA: Diagnosis not present

## 2014-08-09 DIAGNOSIS — K921 Melena: Secondary | ICD-10-CM | POA: Insufficient documentation

## 2014-08-09 DIAGNOSIS — I10 Essential (primary) hypertension: Secondary | ICD-10-CM | POA: Diagnosis not present

## 2014-08-09 DIAGNOSIS — E1129 Type 2 diabetes mellitus with other diabetic kidney complication: Secondary | ICD-10-CM | POA: Diagnosis not present

## 2014-08-10 ENCOUNTER — Encounter: Payer: Self-pay | Admitting: Internal Medicine

## 2014-08-10 ENCOUNTER — Ambulatory Visit (INDEPENDENT_AMBULATORY_CARE_PROVIDER_SITE_OTHER): Payer: Medicare Other | Admitting: Internal Medicine

## 2014-08-10 VITALS — BP 122/74 | HR 68 | Temp 99.4°F | Resp 10 | Wt 193.0 lb

## 2014-08-10 DIAGNOSIS — E1129 Type 2 diabetes mellitus with other diabetic kidney complication: Secondary | ICD-10-CM

## 2014-08-10 DIAGNOSIS — I639 Cerebral infarction, unspecified: Secondary | ICD-10-CM

## 2014-08-10 DIAGNOSIS — E875 Hyperkalemia: Secondary | ICD-10-CM | POA: Diagnosis not present

## 2014-08-10 DIAGNOSIS — N183 Chronic kidney disease, stage 3 unspecified: Secondary | ICD-10-CM | POA: Insufficient documentation

## 2014-08-10 DIAGNOSIS — B372 Candidiasis of skin and nail: Secondary | ICD-10-CM | POA: Diagnosis not present

## 2014-08-10 DIAGNOSIS — N058 Unspecified nephritic syndrome with other morphologic changes: Secondary | ICD-10-CM | POA: Diagnosis not present

## 2014-08-10 DIAGNOSIS — L22 Diaper dermatitis: Secondary | ICD-10-CM | POA: Diagnosis not present

## 2014-08-10 DIAGNOSIS — D509 Iron deficiency anemia, unspecified: Secondary | ICD-10-CM | POA: Diagnosis not present

## 2014-08-10 LAB — COMPREHENSIVE METABOLIC PANEL
ALBUMIN: 4.5 g/dL (ref 3.5–4.8)
ALT: 15 IU/L (ref 0–32)
AST: 20 IU/L (ref 0–40)
Albumin/Globulin Ratio: 1.9 (ref 1.1–2.5)
Alkaline Phosphatase: 48 IU/L (ref 39–117)
BUN / CREAT RATIO: 18 (ref 11–26)
BUN: 20 mg/dL (ref 8–27)
CALCIUM: 10.5 mg/dL — AB (ref 8.7–10.3)
CO2: 17 mmol/L — ABNORMAL LOW (ref 18–29)
CREATININE: 1.13 mg/dL — AB (ref 0.57–1.00)
Chloride: 115 mmol/L — ABNORMAL HIGH (ref 97–108)
GFR calc Af Amer: 54 mL/min/{1.73_m2} — ABNORMAL LOW (ref >59)
GFR, EST NON AFRICAN AMERICAN: 47 mL/min/{1.73_m2} — AB (ref >59)
GLOBULIN, TOTAL: 2.4 g/dL (ref 1.5–4.5)
Glucose: 117 mg/dL — ABNORMAL HIGH (ref 65–99)
Potassium: 5.5 mmol/L — ABNORMAL HIGH (ref 3.5–5.2)
Sodium: 147 mmol/L — ABNORMAL HIGH (ref 134–144)
Total Bilirubin: 0.3 mg/dL (ref 0.0–1.2)
Total Protein: 6.9 g/dL (ref 6.0–8.5)

## 2014-08-10 LAB — CBC WITH DIFFERENTIAL/PLATELET
BASOS: 1 %
Basophils Absolute: 0 10*3/uL (ref 0.0–0.2)
EOS ABS: 0.2 10*3/uL (ref 0.0–0.4)
Eos: 4 %
HCT: 44 % (ref 34.0–46.6)
Hemoglobin: 14.3 g/dL (ref 11.1–15.9)
IMMATURE GRANS (ABS): 0 10*3/uL (ref 0.0–0.1)
IMMATURE GRANULOCYTES: 0 %
Lymphocytes Absolute: 1.2 10*3/uL (ref 0.7–3.1)
Lymphs: 23 %
MCH: 30.1 pg (ref 26.6–33.0)
MCHC: 32.5 g/dL (ref 31.5–35.7)
MCV: 93 fL (ref 79–97)
MONOS ABS: 0.3 10*3/uL (ref 0.1–0.9)
Monocytes: 6 %
NEUTROS PCT: 66 %
Neutrophils Absolute: 3.6 10*3/uL (ref 1.4–7.0)
RBC: 4.75 x10E6/uL (ref 3.77–5.28)
RDW: 14.4 % (ref 12.3–15.4)
WBC: 5.4 10*3/uL (ref 3.4–10.8)

## 2014-08-10 LAB — LIPID PANEL
CHOLESTEROL TOTAL: 177 mg/dL (ref 100–199)
Chol/HDL Ratio: 4.2 ratio units (ref 0.0–4.4)
HDL: 42 mg/dL (ref >39)
LDL Calculated: 86 mg/dL (ref 0–99)
Triglycerides: 245 mg/dL — ABNORMAL HIGH (ref 0–149)
VLDL CHOLESTEROL CAL: 49 mg/dL — AB (ref 5–40)

## 2014-08-10 LAB — HEMOGLOBIN A1C
Est. average glucose Bld gHb Est-mCnc: 137 mg/dL
Hgb A1c MFr Bld: 6.4 % — ABNORMAL HIGH (ref 4.8–5.6)

## 2014-08-10 MED ORDER — LOSARTAN POTASSIUM-HCTZ 50-12.5 MG PO TABS: 1.0000 | ORAL_TABLET | Freq: Every day | ORAL | Status: DC

## 2014-08-10 MED ORDER — CLOPIDOGREL BISULFATE 75 MG PO TABS: 75.0000 mg | ORAL_TABLET | Freq: Every day | ORAL | Status: DC

## 2014-08-10 MED ORDER — METOPROLOL TARTRATE 100 MG PO TABS: ORAL_TABLET | ORAL | Status: DC

## 2014-08-10 MED ORDER — FENOFIBRATE 54 MG PO TABS: 54.0000 mg | ORAL_TABLET | Freq: Every day | ORAL | Status: AC

## 2014-08-10 MED ORDER — METFORMIN HCL 500 MG PO TABS: 500.0000 mg | ORAL_TABLET | Freq: Every day | ORAL | Status: AC

## 2014-08-10 MED ORDER — OXYCODONE-ACETAMINOPHEN 5-325 MG PO TABS: 1.0000 | ORAL_TABLET | Freq: Three times a day (TID) | ORAL | Status: DC | PRN

## 2014-08-10 MED ORDER — ATORVASTATIN CALCIUM 10 MG PO TABS: 10.0000 mg | ORAL_TABLET | Freq: Every day | ORAL | Status: DC

## 2014-08-10 MED ORDER — FESOTERODINE FUMARATE ER 4 MG PO TB24: 4.0000 mg | ORAL_TABLET | Freq: Every day | ORAL | Status: DC

## 2014-08-10 MED ORDER — CLONAZEPAM 0.5 MG PO TABS: 0.5000 mg | ORAL_TABLET | Freq: Two times a day (BID) | ORAL | Status: DC

## 2014-08-10 MED ORDER — NYSTATIN-TRIAMCINOLONE 100000-0.1 UNIT/GM-% EX OINT: 1.0000 "application " | TOPICAL_OINTMENT | Freq: Two times a day (BID) | CUTANEOUS | Status: DC

## 2014-08-10 MED ORDER — QUETIAPINE FUMARATE 25 MG PO TABS: ORAL_TABLET | ORAL | Status: DC

## 2014-08-10 MED ORDER — FERROUS SULFATE 325 (65 FE) MG PO TABS
325.0000 mg | ORAL_TABLET | Freq: Every day | ORAL | Status: DC
Start: 2014-08-10 — End: 2015-02-03

## 2014-08-10 MED ORDER — HYDRALAZINE HCL 25 MG PO TABS: 25.0000 mg | ORAL_TABLET | Freq: Three times a day (TID) | ORAL | Status: DC

## 2014-08-10 MED ORDER — FLUCONAZOLE 100 MG PO TABS: 100.0000 mg | ORAL_TABLET | Freq: Once | ORAL | Status: DC

## 2014-08-10 NOTE — Progress Notes (Signed)
Patient ID: Abigail Brooks, female   DOB: 10-01-1934, 79 y.o.   MRN: [id number]    Chief Complaint  Patient presents with  . Medical Management of Chronic Issues    2 month follow-up, discuss labs   . Rash    Rash in groin area x 2 weeks, please examine.   . Results    Discuss colon scan   . FYI    Will see Urologist 08/19/2014.    Allergies  Allergen Reactions  . Morphine And Related Other (See Comments)    Makes her crazy  . Enalapril Swelling    Facial swelling    HPI 79 y/o female patient is here for follow up visit with her son. She had colon scan , reviewed result ruling out colonic mass or stricture. Patient has been doing fine and improvement noted by son in terms of energy. Denies any dysuria,, pending urology appointment 08/19/14 for cystoscopy and renal ultrasound. She has noticed rash in her groin area for 2 weeks. She has been non complaint with her diet and cbg 2 days back was 300 in the morning.  No cbg checked today Pt has been more interactive recently as per son and has good appetite She has history of HTN, Dementia, CVA, CAD, CHF, DM. Sleeping well with trazodone compliant with her medications  Review of Systems   Constitutional: Negative for fever, chills, weight loss, diaphoresis.   HENT: Negative for congestion, hearing loss and sore throat.    Eyes: Negative for blurred vision Respiratory: Negative for cough, sputum production, shortness of breath and wheezing.    Cardiovascular: Negative for chest pain, palpitations, orthopnea.    Gastrointestinal: Negative for heartburn, nausea, vomiting, abdominal pain Genitourinary: positive for dysuria. No flank pain.   Skin: Negative for itching and rash.   Neurological: Negative for dizziness, tingling, focal weakness and headaches.   Psychiatric/Behavioral: positive for depression and memory loss. The patient is not nervous/anxious.   Past Medical History  Diagnosis Date  . Coronary artery disease    stents x 2, sees Dr. Cecille Aver practice  . Congestive heart failure     2012  . Myocardial infarction     "unsure of when"  . Hypertension   . Diabetes mellitus   . Shortness of breath   . Anemia   . Blood transfusion     when had hysterectomy  . Hypothyroidism     has had hx of treatment  . Urinary frequency   . Urinary tract infection     hx of  . GERD (gastroesophageal reflux disease)   . H/O hiatal hernia   . Arthritis   . Depression   . Spinal stenosis     with bone spurs and ruptured disc  . Cataract   . Blood transfusion without reported diagnosis   . Complications affecting other specified body systems, hypertension   . Other malaise and fatigue   . Dysuria   . Unspecified vitamin D deficiency   . Anxiety state, unspecified   . Other B-complex deficiencies   . Abnormality of gait   . Other and unspecified hyperlipidemia    Past Surgical History  Procedure Laterality Date  . Cataract extraction w/ intraocular lens  implant, bilateral    . Bladder suspension    . Coronary      stents x 2, stent implant placed9/21/06  . Back surgery      ruptured disc, lumbar area  . Appendectomy    . Cardiac catheterization    .  Lumbar laminectomy/decompression microdiscectomy  12/21/2011    Procedure: LUMBAR LAMINECTOMY/DECOMPRESSION MICRODISCECTOMY 1 LEVEL;  Surgeon: Charlie Pitter, MD;  Location: [facility];  Service: Neurosurgery;  Laterality: Bilateral;  Lumbar four-five decompressive lumbar laminectomy; Right Lumbar four-five microdiscectomy  . Eye surgery    . Spine surgery    . Abdominal hysterectomy  1986    Family History  Problem Relation Age of Onset  . Anesthesia problems Sister   . Anesthesia problems Son   . Thyroid cancer Son   . Heart disease Mother   . Diabetes Sister    Current Outpatient Prescriptions on File Prior to Visit  Medication Sig Dispense Refill  . Calcium Carbonate-Vitamin D (CALCIUM + D PO) Take 1 tablet by mouth daily. Calcium 315  mg,Vitamin D3 250 I.U.    . diclofenac sodium (VOLTAREN) 1 % GEL Apply 4 g topically 2 (two) times daily as needed (for knee pain).    . fish oil-omega-3 fatty acids 1000 MG capsule Take 1 g by mouth daily.     Marland Kitchen MOVIPREP 100 G SOLR Take 1 kit (200 g total) by mouth once. 1 kit 0  . nitrofurantoin (MACRODANTIN) 100 MG capsule Take one tablet by mouth twice daily for 2 weeks 28 capsule 0  . nitroGLYCERIN (NITROSTAT) 0.4 MG SL tablet Place 0.4 mg under the tongue every 5 (five) minutes as needed for chest pain. Max 3 tablets in 15 minutes    . phenazopyridine (PYRIDIUM) 100 MG tablet Take 1 tablet (100 mg total) by mouth 3 (three) times daily as needed for pain. 10 tablet 0  . polyethylene glycol (MIRALAX / GLYCOLAX) packet Take 17 g by mouth daily as needed (pain).    Marland Kitchen senna-docusate (SENNA S) 8.6-50 MG per tablet Take 1 tablet by mouth 2 (two) times daily.    . [DISCONTINUED] oxybutynin (DITROPAN-XL) 5 MG 24 hr tablet Take one tablet by mouth once daily for bladder.     No current facility-administered medications on file prior to visit.    Physical exam BP 122/74 mmHg  Pulse 68  Temp(Src) 99.4 F (37.4 C) (Oral)  Resp 10  Wt 193 lb (87.544 kg)  SpO2 94%  General- elderly female in no acute distress, obese Head- atraumatic, normocephalic Neck- no lymphadenopathy Mouth- normal mucus membrane Cardiovascular- normal s1,s2 Respiratory- bilateral clear to auscultation, no wheeze, no rhonchi, no crackles Abdomen- bowel sounds present, soft, non tender Musculoskeletal- able to move all 4 extremities, trace leg edema, on walker, wide based gait Neurological- alert, no focal deficit Skin- warm and dry, erythematous generalized rash on the groin area, moisture noted, no skin tear or opening, no drainage Psychiatry- has dementia   Labs-  CBC Latest Ref Rng 08/09/2014 05/29/2014 05/29/2014  WBC 3.4 - 10.8 x10E3/uL 5.4 - 7.2  Hemoglobin 11.1 - 15.9 g/dL 14.3 13.6 13.0  Hematocrit 34.0 -  46.6 % 44.0 40.0 38.2  Platelets 150 - 400 K/uL - - 178    CMP Latest Ref Rng 08/09/2014 05/29/2014 05/29/2014  Glucose 65 - 99 mg/dL 117(H) 238(H) 239(H)  BUN 8 - 27 mg/dL 20 26(H) 25(H)  Creatinine 0.57 - 1.00 mg/dL 1.13(H) 1.40(H) 1.29(H)  Sodium 134 - 144 mmol/L 147(H) 141 140  Potassium 3.5 - 5.2 mmol/L 5.5(H) 3.7 4.1  Chloride 97 - 108 mmol/L 115(H) 103 101  CO2 18 - 29 mmol/L 17(L) - 24  Calcium 8.7 - 10.3 mg/dL 10.5(H) - 9.6  Total Protein 6.0 - 8.5 g/dL 6.9 - 7.1  Albumin  3.5 - 4.8 g/dL 4.5 - -  Total Bilirubin 0.0 - 1.2 mg/dL 0.3 - 0.3  Alkaline Phos 39 - 117 IU/L 48 - 64  AST 0 - 40 IU/L 20 - 14  ALT 0 - 32 IU/L 15 - 12   Lab Results  Component Value Date   HGBA1C 6.4* 08/09/2014    Assessment/plan  1. Hyperkalemia Recheck cmp today and if elevated, will provide kayexylate. Currently asymptomatic - CMP  2. CKD (chronic kidney disease) stage 3, GFR 30-59 ml/min On metformin once a day. Avoid nsaids. Encourage hydration for now.   3. CVA (cerebral vascular accident) Stable, using walker, no falls, no new focal deficit, continue plavix and lipitor with bp medications  4. Type 2 diabetes mellitus with renal manifestations, controlled a1c s/o controlled diabetes. Continue metofrmin 500 mg daily, if renal function worsens, switch to renally safe medication  5. Candidal rash Fluconazole 100 mg daily x 3 days and nystatin-triamcinolone cream bid for now, reassess if no improvement  6. Iron def anemia H&h stable, continue iron supplement

## 2014-08-11 LAB — COMPREHENSIVE METABOLIC PANEL
A/G RATIO: 1.9 (ref 1.1–2.5)
ALT: 12 IU/L (ref 0–32)
AST: 8 IU/L (ref 0–40)
Albumin: 4.4 g/dL (ref 3.5–4.8)
Alkaline Phosphatase: 46 IU/L (ref 39–117)
BUN/Creatinine Ratio: 19 (ref 11–26)
BUN: 23 mg/dL (ref 8–27)
CALCIUM: 9.7 mg/dL (ref 8.7–10.3)
CO2: 22 mmol/L (ref 18–29)
Chloride: 100 mmol/L (ref 97–108)
Creatinine, Ser: 1.21 mg/dL — ABNORMAL HIGH (ref 0.57–1.00)
GFR calc Af Amer: 50 mL/min/{1.73_m2} — ABNORMAL LOW (ref >59)
GFR, EST NON AFRICAN AMERICAN: 43 mL/min/{1.73_m2} — AB (ref >59)
Globulin, Total: 2.3 g/dL (ref 1.5–4.5)
Glucose: 119 mg/dL — ABNORMAL HIGH (ref 65–99)
Potassium: 3.7 mmol/L (ref 3.5–5.2)
SODIUM: 140 mmol/L (ref 134–144)
Total Bilirubin: 0.4 mg/dL (ref 0.0–1.2)
Total Protein: 6.7 g/dL (ref 6.0–8.5)

## 2014-08-12 ENCOUNTER — Telehealth: Payer: Self-pay | Admitting: *Deleted

## 2014-08-12 NOTE — Telephone Encounter (Signed)
Spoke with son regarding patient labs, he states that he understands the results and has no questions at this time.

## 2014-08-12 NOTE — Telephone Encounter (Signed)
-----   Message from Oneal GroutMahima Pandey, MD sent at 08/12/2014  9:03 AM EST ----- Improved sodium and potassium level. Encourage hdyration

## 2014-08-19 DIAGNOSIS — N3020 Other chronic cystitis without hematuria: Secondary | ICD-10-CM | POA: Diagnosis not present

## 2014-08-19 DIAGNOSIS — N200 Calculus of kidney: Secondary | ICD-10-CM | POA: Diagnosis not present

## 2014-08-19 DIAGNOSIS — N390 Urinary tract infection, site not specified: Secondary | ICD-10-CM | POA: Diagnosis not present

## 2014-08-19 DIAGNOSIS — R312 Other microscopic hematuria: Secondary | ICD-10-CM | POA: Diagnosis not present

## 2014-08-21 ENCOUNTER — Inpatient Hospital Stay (HOSPITAL_COMMUNITY)
Admission: EM | Admit: 2014-08-21 | Discharge: 2014-08-23 | DRG: 690 | Disposition: A | Discharge status: Home / Self Care | Payer: Medicare Other | Attending: Internal Medicine | Admitting: Internal Medicine

## 2014-08-21 ENCOUNTER — Emergency Department (HOSPITAL_COMMUNITY): Payer: Medicare Other

## 2014-08-21 ENCOUNTER — Encounter (HOSPITAL_COMMUNITY): Payer: Self-pay | Admitting: Nurse Practitioner

## 2014-08-21 DIAGNOSIS — R55 Syncope and collapse: Secondary | ICD-10-CM | POA: Diagnosis not present

## 2014-08-21 DIAGNOSIS — Z90710 Acquired absence of both cervix and uterus: Secondary | ICD-10-CM | POA: Diagnosis not present

## 2014-08-21 DIAGNOSIS — I129 Hypertensive chronic kidney disease with stage 1 through stage 4 chronic kidney disease, or unspecified chronic kidney disease: Secondary | ICD-10-CM | POA: Diagnosis present

## 2014-08-21 DIAGNOSIS — E039 Hypothyroidism, unspecified: Secondary | ICD-10-CM | POA: Diagnosis present

## 2014-08-21 DIAGNOSIS — D509 Iron deficiency anemia, unspecified: Secondary | ICD-10-CM | POA: Diagnosis present

## 2014-08-21 DIAGNOSIS — K219 Gastro-esophageal reflux disease without esophagitis: Secondary | ICD-10-CM | POA: Diagnosis present

## 2014-08-21 DIAGNOSIS — I2510 Atherosclerotic heart disease of native coronary artery without angina pectoris: Secondary | ICD-10-CM | POA: Diagnosis present

## 2014-08-21 DIAGNOSIS — Z885 Allergy status to narcotic agent status: Secondary | ICD-10-CM

## 2014-08-21 DIAGNOSIS — F0391 Unspecified dementia with behavioral disturbance: Secondary | ICD-10-CM | POA: Diagnosis present

## 2014-08-21 DIAGNOSIS — Z9861 Coronary angioplasty status: Secondary | ICD-10-CM

## 2014-08-21 DIAGNOSIS — F329 Major depressive disorder, single episode, unspecified: Secondary | ICD-10-CM | POA: Diagnosis present

## 2014-08-21 DIAGNOSIS — R404 Transient alteration of awareness: Secondary | ICD-10-CM | POA: Diagnosis not present

## 2014-08-21 DIAGNOSIS — N390 Urinary tract infection, site not specified: Secondary | ICD-10-CM | POA: Diagnosis not present

## 2014-08-21 DIAGNOSIS — M1990 Unspecified osteoarthritis, unspecified site: Secondary | ICD-10-CM | POA: Diagnosis present

## 2014-08-21 DIAGNOSIS — S199XXA Unspecified injury of neck, initial encounter: Secondary | ICD-10-CM | POA: Diagnosis not present

## 2014-08-21 DIAGNOSIS — F411 Generalized anxiety disorder: Secondary | ICD-10-CM | POA: Diagnosis present

## 2014-08-21 DIAGNOSIS — W19XXXA Unspecified fall, initial encounter: Secondary | ICD-10-CM

## 2014-08-21 DIAGNOSIS — E1122 Type 2 diabetes mellitus with diabetic chronic kidney disease: Secondary | ICD-10-CM | POA: Diagnosis present

## 2014-08-21 DIAGNOSIS — N058 Unspecified nephritic syndrome with other morphologic changes: Secondary | ICD-10-CM | POA: Diagnosis not present

## 2014-08-21 DIAGNOSIS — R531 Weakness: Secondary | ICD-10-CM

## 2014-08-21 DIAGNOSIS — E1129 Type 2 diabetes mellitus with other diabetic kidney complication: Secondary | ICD-10-CM | POA: Diagnosis not present

## 2014-08-21 DIAGNOSIS — N183 Chronic kidney disease, stage 3 unspecified: Secondary | ICD-10-CM | POA: Diagnosis present

## 2014-08-21 DIAGNOSIS — B9689 Other specified bacterial agents as the cause of diseases classified elsewhere: Secondary | ICD-10-CM | POA: Diagnosis not present

## 2014-08-21 DIAGNOSIS — E785 Hyperlipidemia, unspecified: Secondary | ICD-10-CM | POA: Diagnosis present

## 2014-08-21 DIAGNOSIS — S3993XA Unspecified injury of pelvis, initial encounter: Secondary | ICD-10-CM | POA: Diagnosis not present

## 2014-08-21 DIAGNOSIS — Z8673 Personal history of transient ischemic attack (TIA), and cerebral infarction without residual deficits: Secondary | ICD-10-CM | POA: Diagnosis not present

## 2014-08-21 DIAGNOSIS — F17220 Nicotine dependence, chewing tobacco, uncomplicated: Secondary | ICD-10-CM | POA: Diagnosis present

## 2014-08-21 DIAGNOSIS — I252 Old myocardial infarction: Secondary | ICD-10-CM | POA: Diagnosis not present

## 2014-08-21 DIAGNOSIS — I5032 Chronic diastolic (congestive) heart failure: Secondary | ICD-10-CM | POA: Diagnosis present

## 2014-08-21 DIAGNOSIS — Z888 Allergy status to other drugs, medicaments and biological substances status: Secondary | ICD-10-CM | POA: Diagnosis not present

## 2014-08-21 DIAGNOSIS — Z87442 Personal history of urinary calculi: Secondary | ICD-10-CM

## 2014-08-21 DIAGNOSIS — I369 Nonrheumatic tricuspid valve disorder, unspecified: Secondary | ICD-10-CM | POA: Diagnosis not present

## 2014-08-21 DIAGNOSIS — Z6837 Body mass index (BMI) 37.0-37.9, adult: Secondary | ICD-10-CM | POA: Diagnosis not present

## 2014-08-21 DIAGNOSIS — S0990XA Unspecified injury of head, initial encounter: Secondary | ICD-10-CM | POA: Diagnosis not present

## 2014-08-21 DIAGNOSIS — R401 Stupor: Secondary | ICD-10-CM | POA: Diagnosis not present

## 2014-08-21 DIAGNOSIS — F03918 Unspecified dementia, unspecified severity, with other behavioral disturbance: Secondary | ICD-10-CM | POA: Diagnosis present

## 2014-08-21 LAB — COMPREHENSIVE METABOLIC PANEL
ALBUMIN: 3.5 g/dL (ref 3.5–5.2)
ALK PHOS: 49 U/L (ref 39–117)
ALT: 13 U/L (ref 0–35)
AST: 12 U/L (ref 0–37)
Anion gap: 11 (ref 5–15)
BILIRUBIN TOTAL: 0.6 mg/dL (ref 0.3–1.2)
BUN: 20 mg/dL (ref 6–23)
CO2: 26 mmol/L (ref 19–32)
Calcium: 9.2 mg/dL (ref 8.4–10.5)
Chloride: 99 mEq/L (ref 96–112)
Creatinine, Ser: 1.36 mg/dL — ABNORMAL HIGH (ref 0.50–1.10)
GFR calc Af Amer: 42 mL/min — ABNORMAL LOW (ref >90)
GFR calc non Af Amer: 36 mL/min — ABNORMAL LOW (ref >90)
Glucose, Bld: 197 mg/dL — ABNORMAL HIGH (ref 70–99)
POTASSIUM: 4 mmol/L (ref 3.5–5.1)
Sodium: 136 mmol/L (ref 135–145)
Total Protein: 6.8 g/dL (ref 6.0–8.3)

## 2014-08-21 LAB — URINE MICROSCOPIC-ADD ON

## 2014-08-21 LAB — CBC WITH DIFFERENTIAL/PLATELET
BASOS ABS: 0 10*3/uL (ref 0.0–0.1)
BASOS PCT: 0 % (ref 0–1)
Eosinophils Absolute: 0.1 10*3/uL (ref 0.0–0.7)
Eosinophils Relative: 1 % (ref 0–5)
HCT: 38.9 % (ref 36.0–46.0)
HEMOGLOBIN: 13.1 g/dL (ref 12.0–15.0)
Lymphocytes Relative: 12 % (ref 12–46)
Lymphs Abs: 1.1 10*3/uL (ref 0.7–4.0)
MCH: 30.3 pg (ref 26.0–34.0)
MCHC: 33.7 g/dL (ref 30.0–36.0)
MCV: 89.8 fL (ref 78.0–100.0)
MONOS PCT: 7 % (ref 3–12)
Monocytes Absolute: 0.6 10*3/uL (ref 0.1–1.0)
NEUTROS ABS: 6.9 10*3/uL (ref 1.7–7.7)
Neutrophils Relative %: 80 % — ABNORMAL HIGH (ref 43–77)
Platelets: 187 10*3/uL (ref 150–400)
RBC: 4.33 MIL/uL (ref 3.87–5.11)
RDW: 13 % (ref 11.5–15.5)
WBC: 8.7 10*3/uL (ref 4.0–10.5)

## 2014-08-21 LAB — I-STAT TROPONIN, ED: Troponin i, poc: 0.01 ng/mL (ref 0.00–0.08)

## 2014-08-21 LAB — URINALYSIS, ROUTINE W REFLEX MICROSCOPIC
Bilirubin Urine: NEGATIVE
GLUCOSE, UA: NEGATIVE mg/dL
Ketones, ur: NEGATIVE mg/dL
Nitrite: NEGATIVE
Protein, ur: 100 mg/dL — AB
SPECIFIC GRAVITY, URINE: 1.022 (ref 1.005–1.030)
Urobilinogen, UA: 0.2 mg/dL (ref 0.0–1.0)
pH: 5 (ref 5.0–8.0)

## 2014-08-21 LAB — GLUCOSE, CAPILLARY: Glucose-Capillary: 219 mg/dL — ABNORMAL HIGH (ref 70–99)

## 2014-08-21 MED ORDER — SODIUM CHLORIDE 0.9 % IJ SOLN: 3.0000 mL | INTRAMUSCULAR | Status: DC | PRN

## 2014-08-21 MED ORDER — LOSARTAN POTASSIUM 50 MG PO TABS
50.0000 mg | ORAL_TABLET | Freq: Every day | ORAL | Status: DC
  Administered 2014-08-22 – 2014-08-23 (×2): 50 mg via ORAL
  Filled 2014-08-21 (×2): qty 1

## 2014-08-21 MED ORDER — HYDROCHLOROTHIAZIDE 12.5 MG PO CAPS
12.5000 mg | ORAL_CAPSULE | Freq: Every day | ORAL | Status: DC
  Administered 2014-08-22 – 2014-08-23 (×2): 12.5 mg via ORAL
  Filled 2014-08-21 (×2): qty 1

## 2014-08-21 MED ORDER — INSULIN ASPART 100 UNIT/ML ~~LOC~~ SOLN
0.0000 [IU] | Freq: Three times a day (TID) | SUBCUTANEOUS | Status: DC
  Administered 2014-08-21 – 2014-08-22 (×2): 3 [IU] via SUBCUTANEOUS
  Administered 2014-08-22: 2 [IU] via SUBCUTANEOUS
  Administered 2014-08-22: 3 [IU] via SUBCUTANEOUS
  Administered 2014-08-23: 2 [IU] via SUBCUTANEOUS
  Administered 2014-08-23: 3 [IU] via SUBCUTANEOUS

## 2014-08-21 MED ORDER — SODIUM CHLORIDE 0.9 % IV SOLN
INTRAVENOUS | Status: AC
  Administered 2014-08-21: 17:00:00 via INTRAVENOUS

## 2014-08-21 MED ORDER — POLYETHYLENE GLYCOL 3350 17 G PO PACK
17.0000 g | PACK | Freq: Every day | ORAL | Status: DC | PRN
Start: 2014-08-21 — End: 2014-08-23
  Filled 2014-08-21: qty 1

## 2014-08-21 MED ORDER — QUETIAPINE FUMARATE 25 MG PO TABS
25.0000 mg | ORAL_TABLET | Freq: Every day | ORAL | Status: DC
  Administered 2014-08-21 – 2014-08-22 (×2): 25 mg via ORAL
  Filled 2014-08-21 (×3): qty 1

## 2014-08-21 MED ORDER — CLOPIDOGREL BISULFATE 75 MG PO TABS
75.0000 mg | ORAL_TABLET | Freq: Every day | ORAL | Status: DC
Start: 2014-08-22 — End: 2014-08-23
  Administered 2014-08-22 – 2014-08-23 (×2): 75 mg via ORAL
  Filled 2014-08-21 (×2): qty 1

## 2014-08-21 MED ORDER — ONDANSETRON HCL 4 MG/2ML IJ SOLN: 4.0000 mg | Freq: Four times a day (QID) | INTRAMUSCULAR | Status: DC | PRN

## 2014-08-21 MED ORDER — ACETAMINOPHEN 325 MG PO TABS
650.0000 mg | ORAL_TABLET | Freq: Four times a day (QID) | ORAL | Status: DC | PRN
  Administered 2014-08-21 – 2014-08-22 (×3): 650 mg via ORAL
  Filled 2014-08-21 (×3): qty 2

## 2014-08-21 MED ORDER — NITROGLYCERIN 0.4 MG SL SUBL: 0.4000 mg | SUBLINGUAL_TABLET | SUBLINGUAL | Status: DC | PRN

## 2014-08-21 MED ORDER — SENNOSIDES-DOCUSATE SODIUM 8.6-50 MG PO TABS
1.0000 | ORAL_TABLET | Freq: Two times a day (BID) | ORAL | Status: DC
  Administered 2014-08-21 – 2014-08-22 (×3): 1 via ORAL
  Filled 2014-08-21 (×5): qty 1

## 2014-08-21 MED ORDER — ATORVASTATIN CALCIUM 10 MG PO TABS
10.0000 mg | ORAL_TABLET | Freq: Every day | ORAL | Status: DC
  Administered 2014-08-21 – 2014-08-22 (×2): 10 mg via ORAL
  Filled 2014-08-21 (×3): qty 1

## 2014-08-21 MED ORDER — LOSARTAN POTASSIUM-HCTZ 50-12.5 MG PO TABS: 1.0000 | ORAL_TABLET | Freq: Every day | ORAL | Status: DC

## 2014-08-21 MED ORDER — DEXTROSE 5 % IV SOLN
1.0000 g | Freq: Once | INTRAVENOUS | Status: AC
  Administered 2014-08-21: 1 g via INTRAVENOUS
  Filled 2014-08-21: qty 10

## 2014-08-21 MED ORDER — QUETIAPINE FUMARATE 25 MG PO TABS
25.0000 mg | ORAL_TABLET | Freq: Every morning | ORAL | Status: DC
  Administered 2014-08-22 – 2014-08-23 (×2): 25 mg via ORAL
  Filled 2014-08-21 (×2): qty 1

## 2014-08-21 MED ORDER — SODIUM CHLORIDE 0.9 % IJ SOLN
3.0000 mL | Freq: Two times a day (BID) | INTRAMUSCULAR | Status: DC
  Administered 2014-08-22: 3 mL via INTRAVENOUS

## 2014-08-21 MED ORDER — METOPROLOL TARTRATE 100 MG PO TABS
100.0000 mg | ORAL_TABLET | Freq: Two times a day (BID) | ORAL | Status: DC
  Administered 2014-08-22 – 2014-08-23 (×3): 100 mg via ORAL
  Filled 2014-08-21 (×5): qty 1

## 2014-08-21 MED ORDER — SODIUM CHLORIDE 0.9 % IV SOLN: 250.0000 mL | INTRAVENOUS | Status: DC | PRN

## 2014-08-21 MED ORDER — FERROUS SULFATE 325 (65 FE) MG PO TABS
325.0000 mg | ORAL_TABLET | Freq: Every day | ORAL | Status: DC
  Administered 2014-08-22 – 2014-08-23 (×2): 325 mg via ORAL
  Filled 2014-08-21 (×3): qty 1

## 2014-08-21 MED ORDER — INSULIN ASPART 100 UNIT/ML ~~LOC~~ SOLN: 0.0000 [IU] | Freq: Every day | SUBCUTANEOUS | Status: DC

## 2014-08-21 MED ORDER — SODIUM CHLORIDE 0.9 % IJ SOLN
3.0000 mL | Freq: Two times a day (BID) | INTRAMUSCULAR | Status: DC
  Administered 2014-08-23: 3 mL via INTRAVENOUS

## 2014-08-21 MED ORDER — HEPARIN SODIUM (PORCINE) 5000 UNIT/ML IJ SOLN
5000.0000 [IU] | Freq: Three times a day (TID) | INTRAMUSCULAR | Status: DC
  Administered 2014-08-21 – 2014-08-23 (×7): 5000 [IU] via SUBCUTANEOUS
  Filled 2014-08-21 (×9): qty 1

## 2014-08-21 MED ORDER — INSULIN ASPART 100 UNIT/ML ~~LOC~~ SOLN
3.0000 [IU] | Freq: Three times a day (TID) | SUBCUTANEOUS | Status: DC
  Administered 2014-08-21 – 2014-08-23 (×6): 3 [IU] via SUBCUTANEOUS

## 2014-08-21 MED ORDER — POLYETHYLENE GLYCOL 3350 17 G PO PACK: 17.0000 g | PACK | Freq: Every day | ORAL | Status: DC | PRN

## 2014-08-21 MED ORDER — HYDRALAZINE HCL 25 MG PO TABS
25.0000 mg | ORAL_TABLET | Freq: Three times a day (TID) | ORAL | Status: DC
  Administered 2014-08-21 – 2014-08-23 (×6): 25 mg via ORAL
  Filled 2014-08-21 (×8): qty 1

## 2014-08-21 MED ORDER — INSULIN ASPART 100 UNIT/ML ~~LOC~~ SOLN: 3.0000 [IU] | Freq: Three times a day (TID) | SUBCUTANEOUS | Status: DC

## 2014-08-21 MED ORDER — OMEGA-3 FATTY ACIDS 1000 MG PO CAPS
1.0000 g | ORAL_CAPSULE | Freq: Every day | ORAL | Status: DC
  Administered 2014-08-22 – 2014-08-23 (×2): 1 g via ORAL
  Filled 2014-08-21 (×2): qty 1

## 2014-08-21 MED ORDER — FESOTERODINE FUMARATE ER 4 MG PO TB24
4.0000 mg | ORAL_TABLET | Freq: Every day | ORAL | Status: DC
  Administered 2014-08-22 – 2014-08-23 (×2): 4 mg via ORAL
  Filled 2014-08-21 (×2): qty 1

## 2014-08-21 MED ORDER — CLONAZEPAM 0.5 MG PO TABS
0.5000 mg | ORAL_TABLET | Freq: Two times a day (BID) | ORAL | Status: DC
  Administered 2014-08-21 – 2014-08-23 (×4): 0.5 mg via ORAL
  Filled 2014-08-21 (×4): qty 1

## 2014-08-21 MED ORDER — INSULIN ASPART 100 UNIT/ML ~~LOC~~ SOLN: 0.0000 [IU] | Freq: Three times a day (TID) | SUBCUTANEOUS | Status: DC

## 2014-08-21 MED ORDER — FENOFIBRATE 54 MG PO TABS
54.0000 mg | ORAL_TABLET | Freq: Every day | ORAL | Status: DC
  Administered 2014-08-22 – 2014-08-23 (×2): 54 mg via ORAL
  Filled 2014-08-21 (×2): qty 1

## 2014-08-21 MED ORDER — ONDANSETRON HCL 4 MG PO TABS: 4.0000 mg | ORAL_TABLET | Freq: Four times a day (QID) | ORAL | Status: DC | PRN

## 2014-08-21 MED ORDER — ENSURE COMPLETE PO LIQD
237.0000 mL | Freq: Two times a day (BID) | ORAL | Status: DC
  Administered 2014-08-22 – 2014-08-23 (×3): 237 mL via ORAL

## 2014-08-21 MED ORDER — ACETAMINOPHEN 650 MG RE SUPP: 650.0000 mg | Freq: Four times a day (QID) | RECTAL | Status: DC | PRN

## 2014-08-21 MED ORDER — OXYCODONE-ACETAMINOPHEN 5-325 MG PO TABS: 1.0000 | ORAL_TABLET | Freq: Three times a day (TID) | ORAL | Status: DC | PRN

## 2014-08-21 NOTE — ED Notes (Signed)
Per EMS pt from home, lives alone and was called out today by pt.s son was visiting and pt fell and hit head on refrigerator. Pt denies pain, no hematoma or abrasion noted. Pt alert and oriented x4. Pt has had a previous hx of stroke with left sided deficits but has been able to walk at home with walker. After fall pt unable to walk on left sided pt sts was not painful but feels "weak". Pt denies LOC but does not recall what made her fall.

## 2014-08-21 NOTE — ED Notes (Signed)
Dr. Cyril Mourning- Neurologist at bedside.

## 2014-08-21 NOTE — ED Notes (Signed)
Portable at bedside 

## 2014-08-21 NOTE — ED Provider Notes (Signed)
CSN: [id number]     Arrival date & time 08/21/14  1110 History   First MD Initiated Contact with Patient 08/21/14 1111     Chief Complaint  Patient presents with  . Fall     (Consider location/radiation/quality/duration/timing/severity/associated sxs/prior Treatment) The history is provided by the patient.  Abigail Brooks is a 80 y.o. female hx of CAD, recent stroke with residual L side deficits, HTN, DM here with worsening weakness and possible altered mental status.  She woke up a slightly weaker today. Her son went downstairs and GERD and loud noise and found that she hit her head on the refrigerator. She lives alone by herself but didn't remember how she got to the kitchen or how she fell. She was unable to walk and has worsening left-sided weakness afterwards. She also has possible UTI but not on abx and is currently pending urine culture.     Level V caveat- condition of patient   Past Medical History  Diagnosis Date  . Coronary artery disease     stents x 2, sees Dr. Cecille Aver practice  . Congestive heart failure     2012  . Myocardial infarction     "unsure of when"  . Hypertension   . Diabetes mellitus   . Shortness of breath   . Anemia   . Blood transfusion     when had hysterectomy  . Hypothyroidism     has had hx of treatment  . Urinary frequency   . Urinary tract infection     hx of  . GERD (gastroesophageal reflux disease)   . H/O hiatal hernia   . Arthritis   . Depression   . Spinal stenosis     with bone spurs and ruptured disc  . Cataract   . Blood transfusion without reported diagnosis   . Complications affecting other specified body systems, hypertension   . Other malaise and fatigue   . Dysuria   . Unspecified vitamin D deficiency   . Anxiety state, unspecified   . Other B-complex deficiencies   . Abnormality of gait   . Other and unspecified hyperlipidemia    Past Surgical History  Procedure Laterality Date  . Cataract extraction w/  intraocular lens  implant, bilateral    . Bladder suspension    . Coronary      stents x 2, stent implant placed9/21/06  . Back surgery      ruptured disc, lumbar area  . Appendectomy    . Cardiac catheterization    . Lumbar laminectomy/decompression microdiscectomy  12/21/2011    Procedure: LUMBAR LAMINECTOMY/DECOMPRESSION MICRODISCECTOMY 1 LEVEL;  Surgeon: Charlie Pitter, MD;  Location: [facility];  Service: Neurosurgery;  Laterality: Bilateral;  Lumbar four-five decompressive lumbar laminectomy; Right Lumbar four-five microdiscectomy  . Eye surgery    . Spine surgery    . Abdominal hysterectomy  1986   Family History  Problem Relation Age of Onset  . Anesthesia problems Sister   . Anesthesia problems Son   . Thyroid cancer Son   . Heart disease Mother   . Diabetes Sister    History  Substance Use Topics  . Smoking status: Former Smoker    Types: Cigarettes  . Smokeless tobacco: Current User    Types: Snuff     Comment: 40 years ago  . Alcohol Use: No   OB History    No data available     Review of Systems  Neurological: Positive for weakness.  All other  systems reviewed and are negative.     Allergies  Morphine and related and Enalapril  Home Medications   Prior to Admission medications   Medication Sig Start Date End Date Taking? Authorizing Provider  atorvastatin (LIPITOR) 10 MG tablet Take 1 tablet (10 mg total) by mouth daily at 6 PM. 08/10/14   Blanchie Serve, MD  Calcium Carbonate-Vitamin D (CALCIUM + D PO) Take 1 tablet by mouth daily. Calcium 315 mg,Vitamin D3 250 I.U.    Historical Provider, MD  clonazePAM (KLONOPIN) 0.5 MG tablet Take 1 tablet (0.5 mg total) by mouth 2 (two) times daily. 08/10/14   Blanchie Serve, MD  clopidogrel (PLAVIX) 75 MG tablet Take 1 tablet (75 mg total) by mouth daily. 08/10/14   Blanchie Serve, MD  diclofenac sodium (VOLTAREN) 1 % GEL Apply 4 g topically 2 (two) times daily as needed (for knee pain).    Historical Provider, MD   fenofibrate 54 MG tablet Take 1 tablet (54 mg total) by mouth daily. 08/10/14   Blanchie Serve, MD  ferrous sulfate 325 (65 FE) MG tablet Take 1 tablet (325 mg total) by mouth daily with breakfast. 08/10/14   Blanchie Serve, MD  fesoterodine (TOVIAZ) 4 MG TB24 tablet Take 1 tablet (4 mg total) by mouth daily. 08/10/14   Blanchie Serve, MD  fish oil-omega-3 fatty acids 1000 MG capsule Take 1 g by mouth daily.     Historical Provider, MD  fluconazole (DIFLUCAN) 100 MG tablet Take 1 tablet (100 mg total) by mouth once. 08/10/14   Blanchie Serve, MD  hydrALAZINE (APRESOLINE) 25 MG tablet Take 1 tablet (25 mg total) by mouth 3 (three) times daily. 08/10/14   Blanchie Serve, MD  losartan-hydrochlorothiazide (HYZAAR) 50-12.5 MG per tablet Take 1 tablet by mouth daily. 08/10/14   Blanchie Serve, MD  metFORMIN (GLUCOPHAGE) 500 MG tablet Take 1 tablet (500 mg total) by mouth daily with breakfast. 08/10/14   Blanchie Serve, MD  metoprolol (LOPRESSOR) 100 MG tablet TAKE 1 TABLET (100 MG TOTAL) BY MOUTH 2 (TWO) TIMES DAILY. 08/10/14   Blanchie Serve, MD  MOVIPREP 100 G SOLR Take 1 kit (200 g total) by mouth once. 07/12/14   Janett Billow D. Zehr, PA-C  nitrofurantoin (MACRODANTIN) 100 MG capsule Take one tablet by mouth twice daily for 2 weeks 06/18/14   Blanchie Serve, MD  nitroGLYCERIN (NITROSTAT) 0.4 MG SL tablet Place 0.4 mg under the tongue every 5 (five) minutes as needed for chest pain. Max 3 tablets in 15 minutes    Historical Provider, MD  nystatin-triamcinolone ointment (MYCOLOG) Apply 1 application topically 2 (two) times daily. 08/10/14   Blanchie Serve, MD  oxyCODONE-acetaminophen (PERCOCET/ROXICET) 5-325 MG per tablet Take 1-2 tablets by mouth every 8 (eight) hours as needed for severe pain (pain). 08/10/14   Blanchie Serve, MD  phenazopyridine (PYRIDIUM) 100 MG tablet Take 1 tablet (100 mg total) by mouth 3 (three) times daily as needed for pain. 04/13/14   Blanchie Serve, MD  polyethylene glycol (MIRALAX /  GLYCOLAX) packet Take 17 g by mouth daily as needed (pain).    Historical Provider, MD  QUEtiapine (SEROQUEL) 25 MG tablet 1 by mouth in the morning and 2 by mouth in the evening 08/10/14   Blanchie Serve, MD  senna-docusate (SENNA S) 8.6-50 MG per tablet Take 1 tablet by mouth 2 (two) times daily.    Historical Provider, MD   BP 129/51 mmHg  Pulse 55  Temp(Src) 98 F (36.7 C) (Oral)  Resp 20  Ht 5' (  1.524 m)  Wt 193 lb (87.544 kg)  BMI 37.69 kg/m2  SpO2 95% Physical Exam  Constitutional: She is oriented to person, place, and time.  Chronically ill   HENT:  Head: Normocephalic and atraumatic.  Mouth/Throat: Oropharynx is clear and moist.  No obvious scalp hematoma   Eyes: Conjunctivae and EOM are normal. Pupils are equal, round, and reactive to light.  Neck: Normal range of motion. Neck supple.  Cardiovascular: Normal rate, regular rhythm and normal heart sounds.   Pulmonary/Chest: Effort normal and breath sounds normal. No respiratory distress. She has no wheezes. She has no rales.  Abdominal: Soft. Bowel sounds are normal. She exhibits no distension. There is no tenderness. There is no rebound and no guarding.  Musculoskeletal: Normal range of motion. She exhibits no edema or tenderness.  Neurological: She is alert and oriented to person, place, and time.  No obvious facial droop. CN 2-12 intact. Strength 4/5 L side and 5/5 R side.   Skin: Skin is warm and dry.  Psychiatric: She has a normal mood and affect. Her behavior is normal. Judgment and thought content normal.  Nursing note and vitals reviewed.   ED Course  Procedures (including critical care time) Labs Review Labs Reviewed  CBC WITH DIFFERENTIAL - Abnormal; Notable for the following:    Neutrophils Relative % 80 (*)    All other components within normal limits  COMPREHENSIVE METABOLIC PANEL - Abnormal; Notable for the following:    Glucose, Bld 197 (*)    Creatinine, Ser 1.36 (*)    GFR calc non Af Amer 36 (*)     GFR calc Af Amer 42 (*)    All other components within normal limits  URINALYSIS, ROUTINE W REFLEX MICROSCOPIC - Abnormal; Notable for the following:    Color, Urine AMBER (*)    APPearance CLOUDY (*)    Hgb urine dipstick TRACE (*)    Protein, ur 100 (*)    Leukocytes, UA MODERATE (*)    All other components within normal limits  URINE MICROSCOPIC-ADD ON - Abnormal; Notable for the following:    Bacteria, UA FEW (*)    All other components within normal limits  URINE CULTURE  I-STAT TROPOININ, ED    Imaging Review Ct Head Wo Contrast  08/21/2014   CLINICAL DATA:  Pt fell today, hitting her head on the refrigerator; no apparent injury, pt denies h/a, or any LOCHx of CVA x 2, with residual LEFT side weaknessPt uses a walker at home  EXAM: CT HEAD WITHOUT CONTRAST  CT CERVICAL SPINE WITHOUT CONTRAST  TECHNIQUE: Multidetector CT imaging of the head and cervical spine was performed following the standard protocol without intravenous contrast. Multiplanar CT image reconstructions of the cervical spine were also generated.  COMPARISON:  05/29/2014  FINDINGS: CT HEAD FINDINGS  Atherosclerotic and physiologic intracranial calcifications. Diffuse parenchymal atrophy. Patchy areas of hypoattenuation in deep and periventricular white matter bilaterally. Negative for acute intracranial hemorrhage, mass lesion, acute infarction, midline shift, or mass-effect. Acute infarct may be inapparent on noncontrast CT. Ventricles and sulci symmetric. Bone windows demonstrate no focal lesion.  CT CERVICAL SPINE FINDINGS  Normal alignment. No prevertebral soft tissue swelling. Facets are seated. There is mild narrowing of the C5-6 interspace with circumferential disc bulge and associated endplate spurs, resulting in encroachment upon bilateral neural foramina. Negative for fracture. Visualized lung apices clear. Bilateral calcified carotid plaque.  IMPRESSION: 1. Negative for bleed or other acute intracranial process. 2.  Atrophy and nonspecific white matter  changes. 3. No acute cervical spine abnormality. 4. Degenerative disc disease C5-6   Electronically Signed   By: Arne Cleveland M.D.   On: 08/21/2014 12:29   Ct Cervical Spine Wo Contrast  08/21/2014   CLINICAL DATA:  Pt fell today, hitting her head on the refrigerator; no apparent injury, pt denies h/a, or any LOCHx of CVA x 2, with residual LEFT side weaknessPt uses a walker at home  EXAM: CT HEAD WITHOUT CONTRAST  CT CERVICAL SPINE WITHOUT CONTRAST  TECHNIQUE: Multidetector CT imaging of the head and cervical spine was performed following the standard protocol without intravenous contrast. Multiplanar CT image reconstructions of the cervical spine were also generated.  COMPARISON:  05/29/2014  FINDINGS: CT HEAD FINDINGS  Atherosclerotic and physiologic intracranial calcifications. Diffuse parenchymal atrophy. Patchy areas of hypoattenuation in deep and periventricular white matter bilaterally. Negative for acute intracranial hemorrhage, mass lesion, acute infarction, midline shift, or mass-effect. Acute infarct may be inapparent on noncontrast CT. Ventricles and sulci symmetric. Bone windows demonstrate no focal lesion.  CT CERVICAL SPINE FINDINGS  Normal alignment. No prevertebral soft tissue swelling. Facets are seated. There is mild narrowing of the C5-6 interspace with circumferential disc bulge and associated endplate spurs, resulting in encroachment upon bilateral neural foramina. Negative for fracture. Visualized lung apices clear. Bilateral calcified carotid plaque.  IMPRESSION: 1. Negative for bleed or other acute intracranial process. 2. Atrophy and nonspecific white matter changes. 3. No acute cervical spine abnormality. 4. Degenerative disc disease C5-6   Electronically Signed   By: Arne Cleveland M.D.   On: 08/21/2014 12:29   Dg Pelvis Portable  08/21/2014   CLINICAL DATA:  Loss of consciousness today with weakness, fall, initial evaluation  EXAM: PORTABLE  PELVIS 1-2 VIEWS  COMPARISON:  None.  FINDINGS: Pelvic bones intact. Mild pubic symphysitis. Mild sacroiliitis. Mild right and moderate left hip arthritis. No fractures identified. Degenerative changes lower lumbar spine also seen. Bilateral femoral artery calcification.  IMPRESSION: Degenerative changes with no acute findings.   Electronically Signed   By: Skipper Cliche M.D.   On: 08/21/2014 12:15   Dg Chest Port 1 View  08/21/2014   CLINICAL DATA:  Pt had LOC today with weakness. No c/o chest pain or SOB. Hx stroke  EXAM: PORTABLE CHEST - 1 VIEW  COMPARISON:  05/30/2014  FINDINGS: Stable mild cardiomegaly.  Atheromatous aorta.  Lungs are clear. No effusion. Visualized skeletal structures are unremarkable.  IMPRESSION: 1. Stable cardiomegaly.  No acute disease.   Electronically Signed   By: Arne Cleveland M.D.   On: 08/21/2014 12:13     EKG Interpretation None        Date: 08/21/2014  Rate: 57  Rhythm: normal sinus rhythm  QRS Axis: normal  Intervals: normal  ST/T Wave abnormalities: nonspecific ST changes  Conduction Disutrbances:none  Narrative Interpretation:   Old EKG Reviewed: none available    MDM   Final diagnoses:  Fall  Weakness    Abigail Brooks is a 80 y.o. female here with worsening L sided weakness s/p fall. Consider stroke vs syncope vs bleed. Will get CT head/neck, labs, EKG. Will likely need admission for further workup.   2:08 PM CT head showed no bleed. UA + UTI. Given ceftriaxone. Cr slightly elevated. Neuro saw patient and state that given recent stroke workup, will get MRI brain and if neg will not need further stroke workup.    Wandra Arthurs, MD 08/21/14 [phone]

## 2014-08-21 NOTE — ED Notes (Signed)
Dr. Silverio Lay at bedside updating patient and family.

## 2014-08-21 NOTE — ED Notes (Signed)
Admitting at bedside 

## 2014-08-21 NOTE — ED Notes (Signed)
Meal tray ordered 

## 2014-08-21 NOTE — H&P (Signed)
[facility] History and Physical  Abigail Brooks:[id number] DOB: 1935/04/24 DOA: 08/21/2014  Referring physician: Dr. Darl Householder PCP: Abigail Serve, MD   Chief Complaint: syncope and collapse  HPI: Abigail Brooks is a 80 y.o. female with past medical history of coronary artery disease recent stroke with left residual slight deficits, essential hypertension, diabetes type 2 with a last hemoglobin A1c of 6.4, chronic renal disease with a baseline creatinine of 1.1-1.4, who saw her urologist 3 days prior to admission and he did a cystoscopy with an extraction of kidney stone, that comes in for syncopal episode the day of the admission. She relates she lost consciousness and doesn't remember what happened. She denies any prodromal symptoms. She just relates she feels weak. She denies any chest pain shortness of breath. Her son was at bedside relate that he thinks she hit herself in the head. She relates some burning when she urinates, no dizziness upon standing. She denies any prior history of fever chills nausea vomiting.  In the ED: A CT scan of the head was done with results as below. CT neck was done that showed no acute fractures, and multiple other imagings were done with no acute processes. Neurology was consulted by the ED physician.  Review of Systems:  Constitutional:  No weight loss, night sweats, Fevers, chills, fatigue.  HEENT:  No headaches, Difficulty swallowing,Tooth/dental problems,Sore throat,  No sneezing, itching, ear ache, nasal congestion, post nasal drip,  Cardio-vascular:  No chest pain, Orthopnea, PND, swelling in lower extremities, anasarca, dizziness, palpitations  GI:  No heartburn, indigestion, abdominal pain, nausea, vomiting, diarrhea, change in bowel habits, loss of appetite  Resp:  No shortness of breath with exertion or at rest. No excess mucus, no productive cough, No non-productive cough, No coughing up of blood.No change in color of mucus.No  wheezing.No chest wall deformity  Skin:  no rash or lesions.  GU:  no dysuria, change in color of urine, no urgency or frequency. No flank pain.  Musculoskeletal:  No joint pain or swelling. No decreased range of motion. No back pain.  Psych:  No change in mood or affect. No depression or anxiety. No memory loss.   Past Medical History  Diagnosis Date  . Coronary artery disease     stents x 2, sees Dr. Cecille Aver practice  . Congestive heart failure     2012  . Myocardial infarction     "unsure of when"  . Hypertension   . Diabetes mellitus   . Shortness of breath   . Anemia   . Blood transfusion     when had hysterectomy  . Hypothyroidism     has had hx of treatment  . Urinary frequency   . Urinary tract infection     hx of  . GERD (gastroesophageal reflux disease)   . H/O hiatal hernia   . Arthritis   . Depression   . Spinal stenosis     with bone spurs and ruptured disc  . Cataract   . Blood transfusion without reported diagnosis   . Complications affecting other specified body systems, hypertension   . Other malaise and fatigue   . Dysuria   . Unspecified vitamin D deficiency   . Anxiety state, unspecified   . Other B-complex deficiencies   . Abnormality of gait   . Other and unspecified hyperlipidemia    Past Surgical History  Procedure Laterality Date  . Cataract extraction w/ intraocular lens  implant, bilateral    .  Bladder suspension    . Coronary      stents x 2, stent implant placed9/21/06  . Back surgery      ruptured disc, lumbar area  . Appendectomy    . Cardiac catheterization    . Lumbar laminectomy/decompression microdiscectomy  12/21/2011    Procedure: LUMBAR LAMINECTOMY/DECOMPRESSION MICRODISCECTOMY 1 LEVEL;  Surgeon: Charlie Pitter, MD;  Location: [facility];  Service: Neurosurgery;  Laterality: Bilateral;  Lumbar four-five decompressive lumbar laminectomy; Right Lumbar four-five microdiscectomy  . Eye surgery    . Spine surgery    .  Abdominal hysterectomy  1986   Social History:  reports that she has quit smoking. Her smoking use included Cigarettes. She smoked 0.00 packs per day. Her smokeless tobacco use includes Snuff. She reports that she does not drink alcohol or use illicit drugs.  Allergies  Allergen Reactions  . Morphine And Related Other (See Comments)    Makes her crazy  . Enalapril Swelling    Facial swelling    Family History  Problem Relation Age of Onset  . Anesthesia problems Sister   . Anesthesia problems Son   . Thyroid cancer Son   . Heart disease Mother   . Diabetes Sister      Prior to Admission medications   Medication Sig Start Date End Date Taking? Authorizing Provider  atorvastatin (LIPITOR) 10 MG tablet Take 1 tablet (10 mg total) by mouth daily at 6 PM. 08/10/14  Yes Mahima Pandey, MD  Calcium Carbonate-Vitamin D (CALCIUM + D PO) Take 1 tablet by mouth daily. Calcium 315 mg,Vitamin D3 250 I.U.   Yes Historical Provider, MD  clonazePAM (KLONOPIN) 0.5 MG tablet Take 1 tablet (0.5 mg total) by mouth 2 (two) times daily. 08/10/14  Yes Mahima Bubba Camp, MD  clopidogrel (PLAVIX) 75 MG tablet Take 1 tablet (75 mg total) by mouth daily. 08/10/14  Yes Mahima Bubba Camp, MD  diclofenac sodium (VOLTAREN) 1 % GEL Apply 4 g topically 2 (two) times daily as needed (for knee pain).   Yes Historical Provider, MD  fenofibrate 54 MG tablet Take 1 tablet (54 mg total) by mouth daily. 08/10/14  Yes Mahima Bubba Camp, MD  ferrous sulfate 325 (65 FE) MG tablet Take 1 tablet (325 mg total) by mouth daily with breakfast. 08/10/14  Yes Mahima Pandey, MD  fesoterodine (TOVIAZ) 4 MG TB24 tablet Take 1 tablet (4 mg total) by mouth daily. 08/10/14  Yes Mahima Bubba Camp, MD  fish oil-omega-3 fatty acids 1000 MG capsule Take 1 g by mouth daily.    Yes Historical Provider, MD  hydrALAZINE (APRESOLINE) 25 MG tablet Take 1 tablet (25 mg total) by mouth 3 (three) times daily. 08/10/14  Yes Mahima Bubba Camp, MD    losartan-hydrochlorothiazide (HYZAAR) 50-12.5 MG per tablet Take 1 tablet by mouth daily. 08/10/14  Yes Mahima Bubba Camp, MD  metFORMIN (GLUCOPHAGE) 500 MG tablet Take 1 tablet (500 mg total) by mouth daily with breakfast. 08/10/14  Yes Mahima Pandey, MD  metoprolol (LOPRESSOR) 100 MG tablet TAKE 1 TABLET (100 MG TOTAL) BY MOUTH 2 (TWO) TIMES DAILY. 08/10/14  Yes Mahima Bubba Camp, MD  nitroGLYCERIN (NITROSTAT) 0.4 MG SL tablet Place 0.4 mg under the tongue every 5 (five) minutes as needed for chest pain. Max 3 tablets in 15 minutes   Yes Historical Provider, MD  nystatin-triamcinolone ointment (MYCOLOG) Apply 1 application topically 2 (two) times daily. 08/10/14  Yes Mahima Bubba Camp, MD  oxyCODONE-acetaminophen (PERCOCET/ROXICET) 5-325 MG per tablet Take 1-2 tablets by mouth every 8 (eight) hours  as needed for severe pain (pain). 08/10/14  Yes Mahima Bubba Camp, MD  phenazopyridine (PYRIDIUM) 100 MG tablet Take 1 tablet (100 mg total) by mouth 3 (three) times daily as needed for pain. 04/13/14  Yes Mahima Bubba Camp, MD  polyethylene glycol (MIRALAX / GLYCOLAX) packet Take 17 g by mouth daily as needed (pain).   Yes Historical Provider, MD  QUEtiapine (SEROQUEL) 25 MG tablet 1 by mouth in the morning and 2 by mouth in the evening Patient taking differently: Take 25-50 mg by mouth 2 (two) times daily. 1 by mouth in the morning and 2 by mouth in the evening 08/10/14  Yes Mahima Bubba Camp, MD  senna-docusate (SENNA S) 8.6-50 MG per tablet Take 1 tablet by mouth 2 (two) times daily.   Yes Historical Provider, MD  fluconazole (DIFLUCAN) 100 MG tablet Take 1 tablet (100 mg total) by mouth once. Patient not taking: Reported on 08/21/2014 08/10/14   Abigail Serve, MD  MOVIPREP 100 G SOLR Take 1 kit (200 g total) by mouth once. Patient not taking: Reported on 08/21/2014 07/12/14   Janett Billow D. Zehr, PA-C  nitrofurantoin (MACRODANTIN) 100 MG capsule Take one tablet by mouth twice daily for 2 weeks Patient not taking: Reported on  08/21/2014 06/18/14   Abigail Serve, MD   Physical Exam: Filed Vitals:   08/21/14 1330 08/21/14 1345 08/21/14 1400 08/21/14 1415  BP: 133/51 147/67 162/59 150/65  Pulse: 55 55 55 57  Temp:      TempSrc:      Resp: _0 Height:      Weight:      SpO2: 93% 99% 96% 97%    Wt Readings from Last 3 Encounters:  08/21/14 87.544 kg (193 lb)  08/10/14 87.544 kg (193 lb)  07/12/14 87.998 kg (194 lb)    General:  Appears calm and comfortable Eyes: PERRL, normal lids, irises & conjunctiva ENT: grossly normal hearing, lips & tongue Neck: no LAD, masses or thyromegaly Cardiovascular: RRR, no m/r/g. No LE edema. Telemetry: SR, no arrhythmias  Respiratory: CTA bilaterally, no w/r/r. Normal respiratory effort. Abdomen: soft,with suprapubic tenderness or rebound or guarding.  Skin: no rash or induration seen on limited exam Musculoskeletal: grossly normal tone BUE/BLE Psychiatric: grossly normal mood and affect, speech fluent and appropriate Neurologic:Left side residual weakness .          Labs on Admission:  Basic Metabolic Panel:  Recent Labs Lab 08/21/14 1248  NA 136  K 4.0  CL 99  CO2 26  GLUCOSE 197*  BUN 20  CREATININE 1.36*  CALCIUM 9.2   Liver Function Tests:  Recent Labs Lab 08/21/14 1248  AST 12  ALT 13  ALKPHOS 49  BILITOT 0.6  PROT 6.8  ALBUMIN 3.5   No results for input(s): LIPASE, AMYLASE in the last 168 hours. No results for input(s): AMMONIA in the last 168 hours. CBC:  Recent Labs Lab 08/21/14 1248  WBC 8.7  NEUTROABS 6.9  HGB 13.1  HCT 38.9  MCV 89.8  PLT 187   Cardiac Enzymes: No results for input(s): CKTOTAL, CKMB, CKMBINDEX, TROPONINI in the last 168 hours.  BNP (last 3 results) No results for input(s): PROBNP in the last 8760 hours. CBG: No results for input(s): GLUCAP in the last 168 hours.  Radiological Exams on Admission: Ct Head Wo Contrast  08/21/2014   CLINICAL DATA:  Pt fell today, hitting her head on the  refrigerator; no apparent injury, pt denies h/a, or any LOCHx of CVA x  2, with residual LEFT side weaknessPt uses a walker at home  EXAM: CT HEAD WITHOUT CONTRAST  CT CERVICAL SPINE WITHOUT CONTRAST  TECHNIQUE: Multidetector CT imaging of the head and cervical spine was performed following the standard protocol without intravenous contrast. Multiplanar CT image reconstructions of the cervical spine were also generated.  COMPARISON:  05/29/2014  FINDINGS: CT HEAD FINDINGS  Atherosclerotic and physiologic intracranial calcifications. Diffuse parenchymal atrophy. Patchy areas of hypoattenuation in deep and periventricular white matter bilaterally. Negative for acute intracranial hemorrhage, mass lesion, acute infarction, midline shift, or mass-effect. Acute infarct may be inapparent on noncontrast CT. Ventricles and sulci symmetric. Bone windows demonstrate no focal lesion.  CT CERVICAL SPINE FINDINGS  Normal alignment. No prevertebral soft tissue swelling. Facets are seated. There is mild narrowing of the C5-6 interspace with circumferential disc bulge and associated endplate spurs, resulting in encroachment upon bilateral neural foramina. Negative for fracture. Visualized lung apices clear. Bilateral calcified carotid plaque.  IMPRESSION: 1. Negative for bleed or other acute intracranial process. 2. Atrophy and nonspecific white matter changes. 3. No acute cervical spine abnormality. 4. Degenerative disc disease C5-6   Electronically Signed   By: Arne Cleveland M.D.   On: 08/21/2014 12:29   Ct Cervical Spine Wo Contrast  08/21/2014   CLINICAL DATA:  Pt fell today, hitting her head on the refrigerator; no apparent injury, pt denies h/a, or any LOCHx of CVA x 2, with residual LEFT side weaknessPt uses a walker at home  EXAM: CT HEAD WITHOUT CONTRAST  CT CERVICAL SPINE WITHOUT CONTRAST  TECHNIQUE: Multidetector CT imaging of the head and cervical spine was performed following the standard protocol without intravenous  contrast. Multiplanar CT image reconstructions of the cervical spine were also generated.  COMPARISON:  05/29/2014  FINDINGS: CT HEAD FINDINGS  Atherosclerotic and physiologic intracranial calcifications. Diffuse parenchymal atrophy. Patchy areas of hypoattenuation in deep and periventricular white matter bilaterally. Negative for acute intracranial hemorrhage, mass lesion, acute infarction, midline shift, or mass-effect. Acute infarct may be inapparent on noncontrast CT. Ventricles and sulci symmetric. Bone windows demonstrate no focal lesion.  CT CERVICAL SPINE FINDINGS  Normal alignment. No prevertebral soft tissue swelling. Facets are seated. There is mild narrowing of the C5-6 interspace with circumferential disc bulge and associated endplate spurs, resulting in encroachment upon bilateral neural foramina. Negative for fracture. Visualized lung apices clear. Bilateral calcified carotid plaque.  IMPRESSION: 1. Negative for bleed or other acute intracranial process. 2. Atrophy and nonspecific white matter changes. 3. No acute cervical spine abnormality. 4. Degenerative disc disease C5-6   Electronically Signed   By: Arne Cleveland M.D.   On: 08/21/2014 12:29   Dg Pelvis Portable  08/21/2014   CLINICAL DATA:  Loss of consciousness today with weakness, fall, initial evaluation  EXAM: PORTABLE PELVIS 1-2 VIEWS  COMPARISON:  None.  FINDINGS: Pelvic bones intact. Mild pubic symphysitis. Mild sacroiliitis. Mild right and moderate left hip arthritis. No fractures identified. Degenerative changes lower lumbar spine also seen. Bilateral femoral artery calcification.  IMPRESSION: Degenerative changes with no acute findings.   Electronically Signed   By: Skipper Cliche M.D.   On: 08/21/2014 12:15   Dg Chest Port 1 View  08/21/2014   CLINICAL DATA:  Pt had LOC today with weakness. No c/o chest pain or SOB. Hx stroke  EXAM: PORTABLE CHEST - 1 VIEW  COMPARISON:  05/30/2014  FINDINGS: Stable mild cardiomegaly.   Atheromatous aorta.  Lungs are clear. No effusion. Visualized skeletal structures are unremarkable.  IMPRESSION: 1. Stable cardiomegaly.  No acute disease.   Electronically Signed   By: Arne Cleveland M.D.   On: 08/21/2014 12:13    EKG: Independently reviewed. Sinus rhythm no ST segment abnormalities.  Assessment/Plan Syncope and collapse due to UTI (lower urinary tract infection): - She did not loss control her sphincters, she just had a recent urological procedures, her UA shows 21-50 white blood cells and a few bacteria. - She relates she is gotten multiple UTIs. SYC follow-up is up with urology. - I agree with starting her on empiric Rocephin, sent for urine cultures. - Check an MRI which is likely to be negative for an acute cardiovascular event. - Get a PT OT consult.  Chronic diastolic CHF (congestive heart failure) - Euvolemic continue current home medications.  Dementia with behavioral disturbance - Avoid benzodiazepines or narcotics use Haldol for agitation.  Type 2 diabetes mellitus with renal manifestations, controlled: - Hold metformin and start her on a cognitive iodine and sliding scale insulin.  CKD (chronic kidney disease) stage 3, GFR 30-59 ml/min - Creatinine at baseline.   Code Status: full DVT Prophylaxis:heparin Family Communication: son Disposition Plan: inpatient  Time spent: 70 min.  Charlynne Cousins [facility] Pager [phone]

## 2014-08-22 LAB — GLUCOSE, CAPILLARY
GLUCOSE-CAPILLARY: 167 mg/dL — AB (ref 70–99)
GLUCOSE-CAPILLARY: 188 mg/dL — AB (ref 70–99)
GLUCOSE-CAPILLARY: 229 mg/dL — AB (ref 70–99)
Glucose-Capillary: 232 mg/dL — ABNORMAL HIGH (ref 70–99)
Glucose-Capillary: 192 mg/dL — ABNORMAL HIGH (ref 70–99)

## 2014-08-22 LAB — COMPREHENSIVE METABOLIC PANEL
ALT: 13 U/L (ref 0–35)
AST: 11 U/L (ref 0–37)
Albumin: 3 g/dL — ABNORMAL LOW (ref 3.5–5.2)
Alkaline Phosphatase: 41 U/L (ref 39–117)
Anion gap: 7 (ref 5–15)
BUN: 18 mg/dL (ref 6–23)
CO2: 26 mmol/L (ref 19–32)
Calcium: 8.6 mg/dL (ref 8.4–10.5)
Chloride: 105 mEq/L (ref 96–112)
Creatinine, Ser: 1.15 mg/dL — ABNORMAL HIGH (ref 0.50–1.10)
GFR calc Af Amer: 52 mL/min — ABNORMAL LOW (ref >90)
GFR calc non Af Amer: 45 mL/min — ABNORMAL LOW (ref >90)
Glucose, Bld: 200 mg/dL — ABNORMAL HIGH (ref 70–99)
Potassium: 3.7 mmol/L (ref 3.5–5.1)
Sodium: 138 mmol/L (ref 135–145)
TOTAL PROTEIN: 5.8 g/dL — AB (ref 6.0–8.3)
Total Bilirubin: 0.6 mg/dL (ref 0.3–1.2)

## 2014-08-22 LAB — CBC
HCT: 35.8 % — ABNORMAL LOW (ref 36.0–46.0)
Hemoglobin: 11.6 g/dL — ABNORMAL LOW (ref 12.0–15.0)
MCH: 28.6 pg (ref 26.0–34.0)
MCHC: 32.4 g/dL (ref 30.0–36.0)
MCV: 88.4 fL (ref 78.0–100.0)
Platelets: 174 10*3/uL (ref 150–400)
RBC: 4.05 MIL/uL (ref 3.87–5.11)
RDW: 12.9 % (ref 11.5–15.5)
WBC: 7.5 10*3/uL (ref 4.0–10.5)

## 2014-08-22 MED ORDER — ALBUTEROL SULFATE (2.5 MG/3ML) 0.083% IN NEBU: 2.5000 mg | INHALATION_SOLUTION | RESPIRATORY_TRACT | Status: DC | PRN

## 2014-08-22 MED ORDER — CLONIDINE HCL 0.1 MG PO TABS
0.1000 mg | ORAL_TABLET | Freq: Once | ORAL | Status: AC
  Administered 2014-08-22: 0.1 mg via ORAL
  Filled 2014-08-22: qty 1

## 2014-08-22 MED ORDER — CEFTRIAXONE SODIUM IN DEXTROSE 20 MG/ML IV SOLN
1.0000 g | INTRAVENOUS | Status: DC
  Administered 2014-08-22: 1 g via INTRAVENOUS
  Filled 2014-08-22 (×2): qty 50

## 2014-08-22 MED ORDER — HYDRALAZINE HCL 20 MG/ML IJ SOLN
10.0000 mg | Freq: Four times a day (QID) | INTRAMUSCULAR | Status: DC | PRN
  Administered 2014-08-22: 10 mg via INTRAVENOUS
  Filled 2014-08-22: qty 1

## 2014-08-22 MED ORDER — SODIUM CHLORIDE 0.9 % IV SOLN
Freq: Once | INTRAVENOUS | Status: AC
  Administered 2014-08-22: 10:00:00 via INTRAVENOUS

## 2014-08-22 NOTE — Progress Notes (Signed)
Patient Demographics  Abigail Brooks, is a 80 y.o. female, DOB - 1935-03-27, ZOX:[id number]  Admit date - 08/21/2014   Admitting Physician Marinda Elk, MD  Outpatient Primary MD for the patient is Oneal Grout, MD  LOS - 1   Chief Complaint  Patient presents with  . Fall      Admission history of present illness/brief narrative:  80 y.o. female with past medical history of coronary artery disease recent stroke with left residual slight deficits, essential hypertension, diabetes type 2 with a last hemoglobin A1c of 6.4, chronic renal disease with a baseline creatinine of 1.1-1.4, who saw her urologist 3 days prior to admission and he did a cystoscopy with an extraction of kidney stone, that comes in for syncopal episode the day of the admission. She relates she lost consciousness and doesn't remember what happened. She reports generalized weakness, dysuria, polyuria, workup was significant for UTI, patient was started on IV Rocephin, urine culture growing Proteus mirabilis. CT head and cervical spine did not show any acute finding.  Subjective:   Abigail Brooks today has, No headache, No chest pain, No abdominal pain - No Nausea, No new weakness tingling or numbness, No Cough - SOB.   Assessment & Plan    Active Problems:   Chronic diastolic CHF (congestive heart failure)   Syncope   Dementia with behavioral disturbance   Type 2 diabetes mellitus with renal manifestations, controlled   CKD (chronic kidney disease) stage 3, GFR 30-59 ml/min   Anemia, iron deficiency   UTI (lower urinary tract infection)   Syncope and collapse  Syncope and collapse due to UTI (lower urinary tract infection): - She did not loss control her sphincters, she just had a recent urological procedures, her UA shows 21-50 white blood cells and a few bacteria. - She relates she  is gotten multiple UTIs. SYC follow-up is up with urology. - Wound culture growing Proteus, follow on sensitivity ,continue with IV Rocephin 08/21/14 - Get a PT OT consult. - Check echo  Chronic diastolic CHF (congestive heart failure) - Euvolemic continue current home medications.  Dementia with behavioral disturbance - Avoid benzodiazepines or narcotics use Haldol for agitation.  Type 2 diabetes mellitus with renal manifestations, controlled: - Hold metformin and start her pre-meal insulin and sliding scale insulin.  CKD (chronic kidney disease) stage 3, GFR 30-59 ml/min - Creatinine at baseline.  Recent CVA -Continue with Plavix, statin - PT consult  Hypertension - Blood pressure acceptable/labile - Kidney with home medication include idealization, losartan, metoprolol  Code Status: Full  Family Communication: spoke with son over the phone.  Disposition Plan: may need subacute rehab.   Procedures  None   Consults   None   Medications  Scheduled Meds: . sodium chloride   Intravenous Once  . atorvastatin  10 mg Oral q1800  . cefTRIAXone (ROCEPHIN)  IV  1 g Intravenous Q24H  . clonazePAM  0.5 mg Oral BID  . clopidogrel  75 mg Oral Daily  . feeding supplement (ENSURE COMPLETE)  237 mL Oral BID BM  . fenofibrate  54 mg Oral Daily  . ferrous sulfate  325 mg Oral Q breakfast  . fesoterodine  4 mg Oral Daily  . fish oil-omega-3 fatty acids  1 g Oral Daily  . heparin  5,000 Units Subcutaneous 3 times per day  . hydrALAZINE  25 mg Oral TID  . losartan  50 mg Oral Daily   And  . hydrochlorothiazide  12.5 mg Oral Daily  . insulin aspart  0-5 Units Subcutaneous QHS  . insulin aspart  0-9 Units Subcutaneous TID WC  . insulin aspart  3 Units Subcutaneous TID WC  . metoprolol  100 mg Oral BID  . QUEtiapine  25 mg Oral q morning - 10a  . QUEtiapine  25 mg Oral QHS  . senna-docusate  1 tablet Oral BID  . sodium chloride  3 mL Intravenous Q12H  . sodium chloride  3 mL  Intravenous Q12H   Continuous Infusions:  PRN Meds:.sodium chloride, acetaminophen **OR** acetaminophen, albuterol, hydrALAZINE, nitroGLYCERIN, ondansetron **OR** ondansetron (ZOFRAN) IV, oxyCODONE-acetaminophen, polyethylene glycol, sodium chloride  DVT Prophylaxis   Heparin -   Lab Results  Component Value Date   PLT 174 08/22/2014    Antibiotics    Anti-infectives    Start     Dose/Rate Route Frequency Ordered Stop   08/22/14 1545  cefTRIAXone (ROCEPHIN) 1 g in dextrose 5 % 50 mL IVPB - Premix     1 g100 mL/hr over 30 Minutes Intravenous Every 24 hours 08/22/14 1535     08/21/14 1315  cefTRIAXone (ROCEPHIN) 1 g in dextrose 5 % 50 mL IVPB     1 g100 mL/hr over 30 Minutes Intravenous  Once 08/21/14 1310 08/21/14 1431          Objective:   Filed Vitals:   08/22/14 0307 08/22/14 0409 08/22/14 0613 08/22/14 1353  BP: 162/82 158/76 174/60 132/47  Pulse:   64 66  Temp:    98 F (36.7 C)  TempSrc:    Oral  Resp:    20  Height:      Weight:      SpO2:    94%    Wt Readings from Last 3 Encounters:  08/21/14 87.544 kg (193 lb)  08/10/14 87.544 kg (193 lb)  07/12/14 87.998 kg (194 lb)     Intake/Output Summary (Last 24 hours) at 08/22/14 1536 Last data filed at 08/22/14 1353  Gross per 24 hour  Intake    600 ml  Output      0 ml  Net    600 ml     Physical Exam  Awake Alert, Oriented X 3, No new F.N deficits, Normal affect [place].AT,PERRAL Supple Neck,No JVD, No cervical lymphadenopathy appriciated.  Symmetrical Chest wall movement, Good air movement bilaterally, CTAB RRR,No Gallops,Rubs or new Murmurs, No Parasternal Heave +ve B.Sounds, Abd Soft, No tenderness, No organomegaly appriciated, No rebound - guarding or rigidity. No Cyanosis, Clubbing or edema, No new Rash or bruise , left side residual weakness.   Data Review   Micro Results Recent Results (from the past 240 hour(s))  Urine culture     Status: None (Preliminary result)   Collection Time:  08/21/14 11:40 AM  Result Value Ref Range Status   Specimen Description URINE, CATHETERIZED  Final   Special Requests NONE  Final   Colony Count   Final    >=100,000 COLONIES/ML Performed at [facility]    Culture   Final    PROTEUS MIRABILIS Performed at [facility]    Report Status PENDING  Incomplete    Radiology Reports Ct Head Wo Contrast  08/21/2014   CLINICAL DATA:  Pt fell today, hitting her head  on the refrigerator; no apparent injury, pt denies h/a, or any LOCHx of CVA x 2, with residual LEFT side weaknessPt uses a walker at home  EXAM: CT HEAD WITHOUT CONTRAST  CT CERVICAL SPINE WITHOUT CONTRAST  TECHNIQUE: Multidetector CT imaging of the head and cervical spine was performed following the standard protocol without intravenous contrast. Multiplanar CT image reconstructions of the cervical spine were also generated.  COMPARISON:  05/29/2014  FINDINGS: CT HEAD FINDINGS  Atherosclerotic and physiologic intracranial calcifications. Diffuse parenchymal atrophy. Patchy areas of hypoattenuation in deep and periventricular white matter bilaterally. Negative for acute intracranial hemorrhage, mass lesion, acute infarction, midline shift, or mass-effect. Acute infarct may be inapparent on noncontrast CT. Ventricles and sulci symmetric. Bone windows demonstrate no focal lesion.  CT CERVICAL SPINE FINDINGS  Normal alignment. No prevertebral soft tissue swelling. Facets are seated. There is mild narrowing of the C5-6 interspace with circumferential disc bulge and associated endplate spurs, resulting in encroachment upon bilateral neural foramina. Negative for fracture. Visualized lung apices clear. Bilateral calcified carotid plaque.  IMPRESSION: 1. Negative for bleed or other acute intracranial process. 2. Atrophy and nonspecific white matter changes. 3. No acute cervical spine abnormality. 4. Degenerative disc disease C5-6   Electronically Signed   By: Oley Balm M.D.   On:  08/21/2014 12:29   Ct Cervical Spine Wo Contrast  08/21/2014   CLINICAL DATA:  Pt fell today, hitting her head on the refrigerator; no apparent injury, pt denies h/a, or any LOCHx of CVA x 2, with residual LEFT side weaknessPt uses a walker at home  EXAM: CT HEAD WITHOUT CONTRAST  CT CERVICAL SPINE WITHOUT CONTRAST  TECHNIQUE: Multidetector CT imaging of the head and cervical spine was performed following the standard protocol without intravenous contrast. Multiplanar CT image reconstructions of the cervical spine were also generated.  COMPARISON:  05/29/2014  FINDINGS: CT HEAD FINDINGS  Atherosclerotic and physiologic intracranial calcifications. Diffuse parenchymal atrophy. Patchy areas of hypoattenuation in deep and periventricular white matter bilaterally. Negative for acute intracranial hemorrhage, mass lesion, acute infarction, midline shift, or mass-effect. Acute infarct may be inapparent on noncontrast CT. Ventricles and sulci symmetric. Bone windows demonstrate no focal lesion.  CT CERVICAL SPINE FINDINGS  Normal alignment. No prevertebral soft tissue swelling. Facets are seated. There is mild narrowing of the C5-6 interspace with circumferential disc bulge and associated endplate spurs, resulting in encroachment upon bilateral neural foramina. Negative for fracture. Visualized lung apices clear. Bilateral calcified carotid plaque.  IMPRESSION: 1. Negative for bleed or other acute intracranial process. 2. Atrophy and nonspecific white matter changes. 3. No acute cervical spine abnormality. 4. Degenerative disc disease C5-6   Electronically Signed   By: Oley Balm M.D.   On: 08/21/2014 12:29   Dg Pelvis Portable  08/21/2014   CLINICAL DATA:  Loss of consciousness today with weakness, fall, initial evaluation  EXAM: PORTABLE PELVIS 1-2 VIEWS  COMPARISON:  None.  FINDINGS: Pelvic bones intact. Mild pubic symphysitis. Mild sacroiliitis. Mild right and moderate left hip arthritis. No fractures  identified. Degenerative changes lower lumbar spine also seen. Bilateral femoral artery calcification.  IMPRESSION: Degenerative changes with no acute findings.   Electronically Signed   By: Esperanza Heir M.D.   On: 08/21/2014 12:15   Dg Chest Port 1 View  08/21/2014   CLINICAL DATA:  Pt had LOC today with weakness. No c/o chest pain or SOB. Hx stroke  EXAM: PORTABLE CHEST - 1 VIEW  COMPARISON:  05/30/2014  FINDINGS: Stable mild cardiomegaly.  Atheromatous aorta.  Lungs are clear. No effusion. Visualized skeletal structures are unremarkable.  IMPRESSION: 1. Stable cardiomegaly.  No acute disease.   Electronically Signed   By: Oley Balm M.D.   On: 08/21/2014 12:13    CBC  Recent Labs Lab 08/21/14 1248 08/22/14 0325  WBC 8.7 7.5  HGB 13.1 11.6*  HCT 38.9 35.8*  PLT 187 174  MCV 89.8 88.4  MCH 30.3 28.6  MCHC 33.7 32.4  RDW 13.0 12.9  LYMPHSABS 1.1  --   MONOABS 0.6  --   EOSABS 0.1  --   BASOSABS 0.0  --     Chemistries   Recent Labs Lab 08/21/14 1248 08/22/14 0325  NA 136 138  K 4.0 3.7  CL 99 105  CO2 26 26  GLUCOSE 197* 200*  BUN 20 18  CREATININE 1.36* 1.15*  CALCIUM 9.2 8.6  AST 12 11  ALT 13 13  ALKPHOS 49 41  BILITOT 0.6 0.6   ------------------------------------------------------------------------------------------------------------------ estimated creatinine clearance is 40.3 mL/min (by C-G formula based on Cr of 1.15). ------------------------------------------------------------------------------------------------------------------ No results for input(s): HGBA1C in the last 72 hours. ------------------------------------------------------------------------------------------------------------------ No results for input(s): CHOL, HDL, LDLCALC, TRIG, CHOLHDL, LDLDIRECT in the last 72 hours. ------------------------------------------------------------------------------------------------------------------ No results for input(s): TSH, T4TOTAL, T3FREE,  THYROIDAB in the last 72 hours.  Invalid input(s): FREET3 ------------------------------------------------------------------------------------------------------------------ No results for input(s): VITAMINB12, FOLATE, FERRITIN, TIBC, IRON, RETICCTPCT in the last 72 hours.  Coagulation profile No results for input(s): INR, PROTIME in the last 168 hours.  No results for input(s): DDIMER in the last 72 hours.  Cardiac Enzymes No results for input(s): CKMB, TROPONINI, MYOGLOBIN in the last 168 hours.  Invalid input(s): CK ------------------------------------------------------------------------------------------------------------------ Invalid input(s): POCBNP     Time Spent in minutes   35 minutes   Daison Braxton M.D on 08/22/2014 at 3:36 PM  Between 7am to 7pm - Pager - [phone]  After 7pm go to [email or web address] - password [email or web address]  And look for the night coverage person covering for me after hours  [facility] Office  [phone]   **Disclaimer: This note may have been dictated with voice recognition software. Similar sounding words can inadvertently be transcribed and this note may contain transcription errors which may not have been corrected upon publication of note.**

## 2014-08-22 NOTE — Progress Notes (Signed)
Patient refuses to call for help when she has to go to the bathroom. Patient on bed alarm, but still very unsteady.

## 2014-08-22 NOTE — Progress Notes (Signed)
0215 pt. BP 160/80. Notified Calahan, NP. He gave orders for hydralazine  IV SBP>160. Patient in no distress and does not complain of pain.   Other VS stable.   Edgardo Roys

## 2014-08-23 DIAGNOSIS — I369 Nonrheumatic tricuspid valve disorder, unspecified: Secondary | ICD-10-CM

## 2014-08-23 LAB — GLUCOSE, CAPILLARY
GLUCOSE-CAPILLARY: 186 mg/dL — AB (ref 70–99)
GLUCOSE-CAPILLARY: 191 mg/dL — AB (ref 70–99)
Glucose-Capillary: 236 mg/dL — ABNORMAL HIGH (ref 70–99)

## 2014-08-23 LAB — URINE CULTURE: Colony Count: >=100000

## 2014-08-23 MED ORDER — CEFUROXIME AXETIL 250 MG PO TABS: 250.0000 mg | ORAL_TABLET | Freq: Two times a day (BID) | ORAL | Status: AC

## 2014-08-23 NOTE — Care Management Note (Signed)
    Page 1 of 1   08/23/2014     3:12:36 PM CARE MANAGEMENT NOTE 08/23/2014  Patient:  Abigail Brooks, Abigail Brooks   Account Number:  [id number]  Date Initiated:  08/23/2014  Documentation initiated by:  Donn Pierini  Subjective/Objective Assessment:   Pt admitted with syncope/UTI     Action/Plan:   PTA pt lived at home   Anticipated DC Date:  08/23/2014   Anticipated DC Plan:  HOME/SELF CARE      DC Planning Services  CM consult      [facility] Choice  HOME HEALTH   Choice offered to / List presented to:  [name]        HH arranged  HH - 11 Patient Refused      Status of service:  Completed, signed off Medicare Important Message given?  NA - LOS <3 / Initial given by admissions (If response is "NO", the following Medicare IM given date fields will be blank) Date Medicare IM given:   Medicare IM given by:   Date Additional Medicare IM given:   Additional Medicare IM given by:    Discharge Disposition:  HOME/SELF CARE  Per UR Regulation:  Reviewed for med. necessity/level of care/duration of stay  If discussed at Long Length of Stay Meetings, dates discussed:    Comments:  08/23/14- 1430- Donn Pierini RN, BSN [phone] Referral for HH-RN/PT/aide- and info on life alert type services- in to speak with pt- per conversation pt states that she does not feel like she needs HH- and politely refuses referral for any type of HH services- she reports that she is able to do for herself and that she already has a alert system that she wears around her neck and can push a button for help if needed. No referral made for [facility].

## 2014-08-23 NOTE — Clinical Social Work Note (Signed)
CSW received referral for SNF.  Case discussed with case manager and physician.  Plan is to discharge home.  CSW to sign off please re-consult if social work needs arise.  Ervin Knack. Cyler Kappes, MSW, [employer] [phone]

## 2014-08-23 NOTE — Progress Notes (Addendum)
Nutrition Brief Note  Patient identified on the Malnutrition Screening Tool (MST) Report  Wt Readings from Last 15 Encounters:  08/21/14 193 lb (87.544 kg)  08/10/14 193 lb (87.544 kg)  07/12/14 194 lb (87.998 kg)  06/16/14 197 lb 9.6 oz (89.631 kg)  05/30/14 188 lb 8 oz (85.503 kg)  05/05/14 198 lb 9.6 oz (90.084 kg)  04/22/14 203 lb (92.08 kg)  04/13/14 203 lb 12.8 oz (92.443 kg)  02/21/14 186 lb 11.7 oz (84.701 kg)  11/25/13 200 lb (90.719 kg)  11/04/13 203 lb (92.08 kg)  10/21/13 199 lb 6.4 oz (90.447 kg)  08/05/13 195 lb 9.6 oz (88.724 kg)  07/01/13 197 lb (89.359 kg)  06/23/13 197 lb 3.2 oz (89.449 kg)   80 y.o. female with past medical history of coronary artery disease recent stroke with left residual slight deficits, essential hypertension, diabetes type 2 with a last hemoglobin A1c of 6.4, chronic renal disease with a baseline creatinine of 1.1-1.4, who saw her urologist 3 days prior to admission and he did a cystoscopy with an extraction of kidney stone, that comes in for syncopal episode the day of the admission.   Pt in EKG at time of visit. Noted weight maintenance in the past year (weight ranging between [phone]). Appetite currently good. Pt with Ensure Complete BID ordered- will d/c due to good intake and weight stability.   Body mass index is 37.69 kg/(m^2). Patient meets criteria for obesity, class II based on current BMI.   Current diet order is Heart Healthy, patient is consuming approximately 75% of meals at this time. Labs and medications reviewed.   No nutrition interventions warranted at this time. If nutrition issues arise, please consult RD.   Laithan Conchas A. Mayford Knife, RD, LDN, CDE Pager: [phone] After hours Pager: [phone]

## 2014-08-23 NOTE — Discharge Summary (Signed)
Abigail Brooks, 80 y.o., DOB 30-Apr-1935, MRN [id number]. Admission date: 08/21/2014 Discharge Date 08/23/2014 Primary MD Blanchie Serve, MD Admitting Physician Charlynne Cousins, MD  Admission Diagnosis  Weakness [R53.1] UTI (lower urinary tract infection) [N39.0] Fall [W19.XXXA]  Discharge Diagnosis   Active Problems:   Chronic diastolic CHF (congestive heart failure)   Syncope   Dementia with behavioral disturbance   Type 2 diabetes mellitus with renal manifestations, controlled   CKD (chronic kidney disease) stage 3, GFR 30-59 ml/min   Anemia, iron deficiency   UTI (lower urinary tract infection)   Syncope and collapse     PCP please follow: - check BMP , CBC next visit.  Past Medical History  Diagnosis Date  . Coronary artery disease     stents x 2, sees Dr. Cecille Aver practice  . Congestive heart failure     2012  . Myocardial infarction     "unsure of when"  . Hypertension   . Diabetes mellitus   . Shortness of breath   . Anemia   . Blood transfusion     when had hysterectomy  . Hypothyroidism     has had hx of treatment  . Urinary frequency   . Urinary tract infection     hx of  . GERD (gastroesophageal reflux disease)   . H/O hiatal hernia   . Arthritis   . Depression   . Spinal stenosis     with bone spurs and ruptured disc  . Cataract   . Blood transfusion without reported diagnosis   . Complications affecting other specified body systems, hypertension   . Other malaise and fatigue   . Dysuria   . Unspecified vitamin D deficiency   . Anxiety state, unspecified   . Other B-complex deficiencies   . Abnormality of gait   . Other and unspecified hyperlipidemia     Past Surgical History  Procedure Laterality Date  . Cataract extraction w/ intraocular lens  implant, bilateral    . Bladder suspension    . Coronary      stents x 2, stent implant placed9/21/06  . Back surgery      ruptured disc, lumbar area  . Appendectomy    . Cardiac  catheterization    . Lumbar laminectomy/decompression microdiscectomy  12/21/2011    Procedure: LUMBAR LAMINECTOMY/DECOMPRESSION MICRODISCECTOMY 1 LEVEL;  Surgeon: Charlie Pitter, MD;  Location: [facility];  Service: Neurosurgery;  Laterality: Bilateral;  Lumbar four-five decompressive lumbar laminectomy; Right Lumbar four-five microdiscectomy  . Eye surgery    . Spine surgery    . Abdominal hysterectomy  [facility] See H&P, Labs, Consult and Test reports for all details in brief, patient was admitted for   Active Problems:   Chronic diastolic CHF (congestive heart failure)   Syncope   Dementia with behavioral disturbance   Type 2 diabetes mellitus with renal manifestations, controlled   CKD (chronic kidney disease) stage 3, GFR 30-59 ml/min   Anemia, iron deficiency   UTI (lower urinary tract infection)   Syncope and collapse  Admission history of present illness/brief narrative:   80 y.o. female with past medical history of coronary artery disease recent stroke with left residual slight deficits, essential hypertension, diabetes type 2 with a last hemoglobin A1c of 6.4, chronic renal disease with a baseline creatinine of 1.1-1.4, who saw her urologist 3 days prior to admission and he did a cystoscopy with an extraction of kidney stone, that comes in for  syncopal episode the day of the admission. She relates she lost consciousness and doesn't remember what happened. She reports generalized weakness, dysuria, polyuria, workup was significant for UTI, patient was started on IV Rocephin, urine culture growing Proteus mirabilis.treated with 3 days of IV rocephin, to continue another 4 days as outpatient on Ceftin. CT head and cervical spine did not show any acute finding. 2-D echo done, did show EF 70%, no significant events on telemetry.  Syncope and collapse due to UTI (lower urinary tract infection): - She did not loss control her sphincters, she just had a recent urological  procedures, her UA shows 21-50 white blood cells and a few bacteria. - She relates she is gotten multiple UTIs. SYC follow-up is up with urology. - Wound culture growing Proteus, sensitive to Rocephin,treated  with IV Rocephin 08/21/14 > 08/23/14 , to finish another 4 days of ceftin. - Echo showing EF 70%,  - No significant events on telemetry - Denies any new focal deficits or worsening of her old left-sided weakness.  Chronic diastolic CHF (congestive heart failure) - Euvolemic resume home medications on discharge.  Dementia with behavioral disturbance   Type 2 diabetes mellitus with renal manifestations, controlled: - Resume on metformin on discharge.  CKD (chronic kidney disease) stage 3, GFR 30-59 ml/min - Creatinine at baseline.  Recent CVA -Continue with Plavix, statin   Hypertension - Blood pressure acceptable/labile - Resume home medication on discharge.    Significant Tests:  See full reports for all details    Ct Head Wo Contrast  08/21/2014   CLINICAL DATA:  Pt fell today, hitting her head on the refrigerator; no apparent injury, pt denies h/a, or any LOCHx of CVA x 2, with residual LEFT side weaknessPt uses a walker at home  EXAM: CT HEAD WITHOUT CONTRAST  CT CERVICAL SPINE WITHOUT CONTRAST  TECHNIQUE: Multidetector CT imaging of the head and cervical spine was performed following the standard protocol without intravenous contrast. Multiplanar CT image reconstructions of the cervical spine were also generated.  COMPARISON:  05/29/2014  FINDINGS: CT HEAD FINDINGS  Atherosclerotic and physiologic intracranial calcifications. Diffuse parenchymal atrophy. Patchy areas of hypoattenuation in deep and periventricular white matter bilaterally. Negative for acute intracranial hemorrhage, mass lesion, acute infarction, midline shift, or mass-effect. Acute infarct may be inapparent on noncontrast CT. Ventricles and sulci symmetric. Bone windows demonstrate no focal lesion.  CT CERVICAL  SPINE FINDINGS  Normal alignment. No prevertebral soft tissue swelling. Facets are seated. There is mild narrowing of the C5-6 interspace with circumferential disc bulge and associated endplate spurs, resulting in encroachment upon bilateral neural foramina. Negative for fracture. Visualized lung apices clear. Bilateral calcified carotid plaque.  IMPRESSION: 1. Negative for bleed or other acute intracranial process. 2. Atrophy and nonspecific white matter changes. 3. No acute cervical spine abnormality. 4. Degenerative disc disease C5-6   Electronically Signed   By: Daniel  Hassell M.D.   On: 08/21/2014 12:29   Ct Cervical Spine Wo Contrast  08/21/2014   CLINICAL DATA:  Pt fell today, hitting her head on the refrigerator; no apparent injury, pt denies h/a, or any LOCHx of CVA x 2, with residual LEFT side weaknessPt uses a walker at home  EXAM: CT HEAD WITHOUT CONTRAST  CT CERVICAL SPINE WITHOUT CONTRAST  TECHNIQUE: Multidetector CT imaging of the head and cervical spine was performed following the standard protocol without intravenous contrast. Multiplanar CT image reconstructions of the cervical spine were also generated.  COMPARISON:  05/29/2014  FINDINGS: CT HEAD   FINDINGS  Atherosclerotic and physiologic intracranial calcifications. Diffuse parenchymal atrophy. Patchy areas of hypoattenuation in deep and periventricular white matter bilaterally. Negative for acute intracranial hemorrhage, mass lesion, acute infarction, midline shift, or mass-effect. Acute infarct may be inapparent on noncontrast CT. Ventricles and sulci symmetric. Bone windows demonstrate no focal lesion.  CT CERVICAL SPINE FINDINGS  Normal alignment. No prevertebral soft tissue swelling. Facets are seated. There is mild narrowing of the C5-6 interspace with circumferential disc bulge and associated endplate spurs, resulting in encroachment upon bilateral neural foramina. Negative for fracture. Visualized lung apices clear. Bilateral calcified  carotid plaque.  IMPRESSION: 1. Negative for bleed or other acute intracranial process. 2. Atrophy and nonspecific white matter changes. 3. No acute cervical spine abnormality. 4. Degenerative disc disease C5-6   Electronically Signed   By: Arne Cleveland M.D.   On: 08/21/2014 12:29   Dg Pelvis Portable  08/21/2014   CLINICAL DATA:  Loss of consciousness today with weakness, fall, initial evaluation  EXAM: PORTABLE PELVIS 1-2 VIEWS  COMPARISON:  None.  FINDINGS: Pelvic bones intact. Mild pubic symphysitis. Mild sacroiliitis. Mild right and moderate left hip arthritis. No fractures identified. Degenerative changes lower lumbar spine also seen. Bilateral femoral artery calcification.  IMPRESSION: Degenerative changes with no acute findings.   Electronically Signed   By: Skipper Cliche M.D.   On: 08/21/2014 12:15   Dg Chest Port 1 View  08/21/2014   CLINICAL DATA:  Pt had LOC today with weakness. No c/o chest pain or SOB. Hx stroke  EXAM: PORTABLE CHEST - 1 VIEW  COMPARISON:  05/30/2014  FINDINGS: Stable mild cardiomegaly.  Atheromatous aorta.  Lungs are clear. No effusion. Visualized skeletal structures are unremarkable.  IMPRESSION: 1. Stable cardiomegaly.  No acute disease.   Electronically Signed   By: Arne Cleveland M.D.   On: 08/21/2014 12:13   Dg Colon W/cm - Wo/w Kub  08/09/2014   CLINICAL DATA:  Subsequent encounter for blood in stools.  EXAM: SINGLE COLUMN BARIUM ENEMA  TECHNIQUE: Initial scout AP supine abdominal image obtained to insure adequate colon cleansing. Barium was introduced into the colon in a retrograde fashion and refluxed from the rectum to the cecum. Spot images of the colon followed by overhead radiographs were obtained.  FLUOROSCOPY TIME:  3 min and 2 seconds  COMPARISON:  None.  FINDINGS: Due to patient limited mobility, single contrast barium enema was performed. There is no gross colonic mass lesion. No colonic stricture. No substantial diverticular disease. There may be a tiny  sessile polyp in the distal transverse colon seen early in the exam, but this could not be re-demonstrated on the later more distended images. It is possible this could have represented a small air bubble.  IMPRESSION: No gross colonic mass or stricture.  Question tiny to small polyp in the distal transverse colon.   Electronically Signed   By: Misty Stanley M.D.   On: 08/09/2014 13:07     Today   Subjective:   Bridgett Hattabaugh today has no headache,no chest abdominal pain,no new weakness tingling or numbness, feels much better wants to go home today.   Objective:   Blood pressure 142/63, pulse 60, temperature 98.6 F (37 C), temperature source Oral, resp. rate 18, height 5' (1.524 m), weight 87.544 kg (193 lb), SpO2 97 %.  Intake/Output Summary (Last 24 hours) at 08/23/14 1440 Last data filed at 08/22/14 1758  Gross per 24 hour  Intake    240 ml  Output  100 ml  Net    140 ml    Exam Awake Alert, Oriented *3, No new F.N deficits, Normal affect [place].AT,PERRAL Supple Neck,No JVD, No cervical lymphadenopathy appriciated.  Symmetrical Chest wall movement, Good air movement bilaterally, CTAB RRR,No Gallops,Rubs or new Murmurs, No Parasternal Heave +ve B.Sounds, Abd Soft, Non tender, No organomegaly appriciated, No rebound -guarding or rigidity. No Cyanosis, Clubbing or edema, No new Rash or bruise  Data Review   Cultures -  Results for orders placed or performed during the hospital encounter of 08/21/14  Urine culture     Status: None   Collection Time: 08/21/14 11:40 AM  Result Value Ref Range Status   Specimen Description URINE, CATHETERIZED  Final   Special Requests NONE  Final   Colony Count   Final    >=100,000 COLONIES/ML Performed at [facility]    Culture   Final    PROTEUS MIRABILIS Performed at [facility]    Report Status 08/23/2014 FINAL  Final   Organism ID, Bacteria PROTEUS MIRABILIS  Final      Susceptibility   Proteus mirabilis - MIC*     AMPICILLIN <=2 SENSITIVE Sensitive     CEFAZOLIN <=4 SENSITIVE Sensitive     CEFTRIAXONE <=1 SENSITIVE Sensitive     CIPROFLOXACIN >=4 RESISTANT Resistant     GENTAMICIN 8 INTERMEDIATE Intermediate     LEVOFLOXACIN >=8 RESISTANT Resistant     NITROFURANTOIN 128 RESISTANT Resistant     TOBRAMYCIN 8 INTERMEDIATE Intermediate     TRIMETH/SULFA >=320 RESISTANT Resistant     PIP/TAZO <=4 SENSITIVE Sensitive     * PROTEUS MIRABILIS     CBC w Diff: Lab Results  Component Value Date   WBC 7.5 08/22/2014   WBC 5.4 08/09/2014   WBC 6.2 08/10/2012   HGB 11.6* 08/22/2014   HGB 14.0 08/10/2012   HCT 35.8* 08/22/2014   HCT 43.0 08/10/2012   PLT 174 08/22/2014   LYMPHOPCT 12 08/21/2014   MONOPCT 7 08/21/2014   EOSPCT 1 08/21/2014   BASOPCT 0 08/21/2014   CMP: Lab Results  Component Value Date   NA 138 08/22/2014   NA 140 08/10/2014   K 3.7 08/22/2014   CL 105 08/22/2014   CO2 26 08/22/2014   BUN 18 08/22/2014   BUN 23 08/10/2014   CREATININE 1.15* 08/22/2014   PROT 5.8* 08/22/2014   PROT 6.7 08/10/2014   ALBUMIN 3.0* 08/22/2014   BILITOT 0.6 08/22/2014   ALKPHOS 41 08/22/2014   AST 11 08/22/2014   ALT 13 08/22/2014  .  Micro Results Recent Results (from the past 240 hour(s))  Urine culture     Status: None   Collection Time: 08/21/14 11:40 AM  Result Value Ref Range Status   Specimen Description URINE, CATHETERIZED  Final   Special Requests NONE  Final   Colony Count   Final    >=100,000 COLONIES/ML Performed at [facility]    Culture   Final    PROTEUS MIRABILIS Performed at [facility]    Report Status 08/23/2014 FINAL  Final   Organism ID, Bacteria PROTEUS MIRABILIS  Final      Susceptibility   Proteus mirabilis - MIC*    AMPICILLIN <=2 SENSITIVE Sensitive     CEFAZOLIN <=4 SENSITIVE Sensitive     CEFTRIAXONE <=1 SENSITIVE Sensitive     CIPROFLOXACIN >=4 RESISTANT Resistant     GENTAMICIN 8 INTERMEDIATE Intermediate     LEVOFLOXACIN >=8  RESISTANT Resistant       NITROFURANTOIN 128 RESISTANT Resistant     TOBRAMYCIN 8 INTERMEDIATE Intermediate     TRIMETH/SULFA >=320 RESISTANT Resistant     PIP/TAZO <=4 SENSITIVE Sensitive     * PROTEUS MIRABILIS     Discharge Instructions      Follow-up Information    Follow up with [facility], MAHIMA, MD. Schedule an appointment as soon as possible for a visit in 1 week.   Specialty:  Internal Medicine   Contact information:   [place] [address] [phone]       Discharge Medications     Medication List    STOP taking these medications        fluconazole 100 MG tablet  Commonly known as:  DIFLUCAN     nitrofurantoin 100 MG capsule  Commonly known as:  MACRODANTIN     oxyCODONE-acetaminophen 5-325 MG per tablet  Commonly known as:  PERCOCET/ROXICET      TAKE these medications        atorvastatin 10 MG tablet  Commonly known as:  LIPITOR  Take 1 tablet (10 mg total) by mouth daily at 6 PM.     CALCIUM + D PO  Take 1 tablet by mouth daily. Calcium 315 mg,Vitamin D3 250 I.U.     cefUROXime 250 MG tablet  Commonly known as:  CEFTIN  Take 1 tablet (250 mg total) by mouth 2 (two) times daily with a meal.     clonazePAM 0.5 MG tablet  Commonly known as:  KLONOPIN  Take 1 tablet (0.5 mg total) by mouth 2 (two) times daily.     clopidogrel 75 MG tablet  Commonly known as:  PLAVIX  Take 1 tablet (75 mg total) by mouth daily.     diclofenac sodium 1 % Gel  Commonly known as:  VOLTAREN  Apply 4 g topically 2 (two) times daily as needed (for knee pain).     fenofibrate 54 MG tablet  Take 1 tablet (54 mg total) by mouth daily.     ferrous sulfate 325 (65 FE) MG tablet  Take 1 tablet (325 mg total) by mouth daily with breakfast.     fesoterodine 4 MG Tb24 tablet  Commonly known as:  TOVIAZ  Take 1 tablet (4 mg total) by mouth daily.     fish oil-omega-3 fatty acids 1000 MG capsule  Take 1 g by mouth daily.     hydrALAZINE 25 MG tablet   Commonly known as:  APRESOLINE  Take 1 tablet (25 mg total) by mouth 3 (three) times daily.     losartan-hydrochlorothiazide 50-12.5 MG per tablet  Commonly known as:  HYZAAR  Take 1 tablet by mouth daily.     metFORMIN 500 MG tablet  Commonly known as:  GLUCOPHAGE  Take 1 tablet (500 mg total) by mouth daily with breakfast.     metoprolol 100 MG tablet  Commonly known as:  LOPRESSOR  TAKE 1 TABLET (100 MG TOTAL) BY MOUTH 2 (TWO) TIMES DAILY.     MOVIPREP 100 G Solr  Generic drug:  peg 3350 powder  Take 1 kit (200 g total) by mouth once.     nitroGLYCERIN 0.4 MG SL tablet  Commonly known as:  NITROSTAT  Place 0.4 mg under the tongue every 5 (five) minutes as needed for chest pain. Max 3 tablets in 15 minutes     nystatin-triamcinolone ointment  Commonly known as:  MYCOLOG  Apply 1 application topically 2 (two) times daily.  phenazopyridine 100 MG tablet  Commonly known as:  PYRIDIUM  Take 1 tablet (100 mg total) by mouth 3 (three) times daily as needed for pain.     polyethylene glycol packet  Commonly known as:  MIRALAX / GLYCOLAX  Take 17 g by mouth daily as needed (pain).     QUEtiapine 25 MG tablet  Commonly known as:  SEROQUEL  1 by mouth in the morning and 2 by mouth in the evening     SENNA S 8.6-50 MG per tablet  Generic drug:  senna-docusate  Take 1 tablet by mouth 2 (two) times daily.         Total Time in preparing paper work, data evaluation and todays exam - 35 minutes  ,  M.D on 08/23/2014 at 2:40 PM  [facility] Office  [phone] 

## 2014-08-23 NOTE — Progress Notes (Signed)
Utilization review completed.  

## 2014-08-23 NOTE — Discharge Instructions (Signed)
Follow with Primary MD Oneal Grout, MD in 7 days   Get CBC, CMP, 2 view Chest X ray checked  by Primary MD next visit.    Activity: As tolerated with Full fall precautions use walker/cane & assistance as needed   Disposition Home    Diet: Heart Healthy  , with feeding assistance and aspiration precautions as needed.  For Heart failure patients - Check your Weight same time everyday, if you gain over 2 pounds, or you develop in leg swelling, experience more shortness of breath or chest pain, call your Primary MD immediately. Follow Cardiac Low Salt Diet and 1.8 lit/day fluid restriction.   On your next visit with your primary care physician please Get Medicines reviewed and adjusted.   Please request your Prim.MD to go over all Hospital Tests and Procedure/Radiological results at the follow up, please get all Hospital records sent to your Prim MD by signing hospital release before you go home.   If you experience worsening of your admission symptoms, develop shortness of breath, life threatening emergency, suicidal or homicidal thoughts you must seek medical attention immediately by calling 911 or calling your MD immediately  if symptoms less severe.  You Must read complete instructions/literature along with all the possible adverse reactions/side effects for all the Medicines you take and that have been prescribed to you. Take any new Medicines after you have completely understood and accpet all the possible adverse reactions/side effects.   Do not drive, operating heavy machinery, perform activities at heights, swimming or participation in water activities or provide baby sitting services if your were admitted for syncope or siezures until you have seen by Primary MD or a Neurologist and advised to do so again.  Do not drive when taking Pain medications.    Do not take more than prescribed Pain, Sleep and Anxiety Medications  Special Instructions: If you have smoked or chewed  Tobacco  in the last 2 yrs please stop smoking, stop any regular Alcohol  and or any Recreational drug use.  Wear Seat belts while driving.   Please note  You were cared for by a hospitalist during your hospital stay. If you have any questions about your discharge medications or the care you received while you were in the hospital after you are discharged, you can call the unit and asked to speak with the hospitalist on call if the hospitalist that took care of you is not available. Once you are discharged, your primary care physician will handle any further medical issues. Please note that NO REFILLS for any discharge medications will be authorized once you are discharged, as it is imperative that you return to your primary care physician (or establish a relationship with a primary care physician if you do not have one) for your aftercare needs so that they can reassess your need for medications and monitor your lab values.

## 2014-08-23 NOTE — Progress Notes (Signed)
PT Cancellation Note  Patient Details Name: Abigail Brooks MRN: [id number] DOB: 12-12-1934   Cancelled Treatment:    Reason Eval/Treat Not Completed: Patient at procedure or test/unavailable (pt receiving echocardiogram and not available)   Toney Sang Beth 08/23/2014, 10:43 AM Delaney Meigs, PT [phone]

## 2014-08-23 NOTE — Progress Notes (Signed)
  Echocardiogram 2D Echocardiogram has been performed.  Abigail Brooks 08/23/2014, 11:11 AM

## 2014-08-23 NOTE — Evaluation (Signed)
Physical Therapy Evaluation/ Discharge Patient Details Name: Abigail Brooks MRN: [id number] DOB: 20-Mar-1935 Today's Date: 08/23/2014   History of Present Illness  80 y.o. female with past medical history of coronary artery disease recent stroke with left residual slight deficits, essential hypertension, diabetes type 2, chronic renal disease, who saw her urologist 3 days prior to admission and he did a cystoscopy with an extraction of kidney stone, that comes in for syncopal episode the day of the admission postive UTI  Clinical Impression  Ms.Homeyer is a very pleasant lady who moves well but does report 3 falls in the last year with constant use of RW for ambulation. Pt demonstrated HEP she performs at home standing at sink for lower body and with theraband seated for upper body. Added seated marching and walking timed distance in home as two additions to current routine which pt states understanding. Pt also encouraged to have some sort of life alert system for home since she is alone and son checks in on or calls daily. Pt at baseline function and able to state correct sequence for ADLs and safety actions. No further acute needs at this time and recommend daily ambulation with nursing supervision with RW acutely. Will sign off with pt aware and agreeable.     Follow Up Recommendations No PT follow up    Equipment Recommendations  None recommended by PT , life alert system   Recommendations for Other Services       Precautions / Restrictions Precautions Precautions: Fall      Mobility  Bed Mobility Overal bed mobility: Modified Independent                Transfers Overall transfer level: Needs assistance   Transfers: Sit to/from Stand Sit to Stand: Supervision         General transfer comment: cues for correct hand placement  Ambulation/Gait Ambulation/Gait assistance: Modified independent (Device/Increase time) Ambulation Distance (Feet): 400 Feet Assistive  device: Rolling walker (2 wheeled) Gait Pattern/deviations: Step-through pattern;Decreased stride length   Gait velocity interpretation: at or above normal speed for age/gender    Stairs Stairs: Yes   Stair Management: One rail Left;Forwards;Sideways Number of Stairs: 3 General stair comments: forward to ascend and sideways to descend  Wheelchair Mobility    Modified Rankin (Stroke Patients Only)       Balance                                             Pertinent Vitals/Pain Pain Assessment: No/denies pain    Home Living Family/patient expects to be discharged to:: Private residence Living Arrangements: Alone Available Help at Discharge: Family;Available PRN/intermittently Type of Home: House Home Access: Stairs to enter Entrance Stairs-Rails: Doctor, general practice of Steps: 3 Home Layout: One level Home Equipment: Walker - 2 wheels;Cane - single point;Grab bars - tub/shower;Bedside commode Additional Comments: son does laundry in basement, he and granddgtr do the house work and shopping, pt fixes easy prep meals    Prior Function Level of Independence: Independent with assistive device(s)         Comments: mod I for transfers and gait, seated for bathing and dressing assist with household     Hand Dominance        Extremity/Trunk Assessment   Upper Extremity Assessment: Overall WFL for tasks assessed  Lower Extremity Assessment: Overall WFL for tasks assessed      Cervical / Trunk Assessment: Kyphotic  Communication   Communication: No difficulties  Cognition Arousal/Alertness: Awake/alert Behavior During Therapy: WFL for tasks assessed/performed Overall Cognitive Status: Within Functional Limits for tasks assessed                      General Comments      Exercises        Assessment/Plan    PT Assessment Patent does not need any further PT services  PT Diagnosis Generalized weakness    PT Problem List    PT Treatment Interventions     PT Goals (Current goals can be found in the Care Plan section) Acute Rehab PT Goals PT Goal Formulation: All assessment and education complete, DC therapy    Frequency     Barriers to discharge        Co-evaluation               End of Session   Activity Tolerance: Patient tolerated treatment well Patient left: in chair;with call bell/phone within reach Nurse Communication: Mobility status         Time: 8119-1478 PT Time Calculation (min) (ACUTE ONLY): 20 min   Charges:   PT Evaluation $Initial PT Evaluation Tier I: 1 Procedure PT Treatments $Therapeutic Activity: 8-22 mins   PT G CodesDelorse Lek 08/23/2014, 1:25 PM  Delaney Meigs, PT [phone]

## 2014-08-23 NOTE — Progress Notes (Signed)
Pt/family given discharge instructions, medication lists, follow up appointments, and when to call the doctor.  Pt/family verbalizes understanding.  

## 2014-08-25 ENCOUNTER — Encounter: Payer: Self-pay | Admitting: *Deleted

## 2014-08-26 ENCOUNTER — Encounter (HOSPITAL_COMMUNITY): Admission: RE | Payer: Self-pay | Source: Ambulatory Visit

## 2014-08-26 ENCOUNTER — Encounter: Payer: Self-pay | Admitting: Neurology

## 2014-08-26 ENCOUNTER — Ambulatory Visit (INDEPENDENT_AMBULATORY_CARE_PROVIDER_SITE_OTHER): Payer: Medicare Other | Admitting: Neurology

## 2014-08-26 ENCOUNTER — Ambulatory Visit (HOSPITAL_COMMUNITY): Admission: RE | Admit: 2014-08-26 | Payer: Medicare Other | Source: Ambulatory Visit | Admitting: Gastroenterology

## 2014-08-26 VITALS — BP 145/79 | HR 55

## 2014-08-26 DIAGNOSIS — F329 Major depressive disorder, single episode, unspecified: Secondary | ICD-10-CM

## 2014-08-26 DIAGNOSIS — G3184 Mild cognitive impairment, so stated: Secondary | ICD-10-CM | POA: Diagnosis not present

## 2014-08-26 DIAGNOSIS — F32A Depression, unspecified: Secondary | ICD-10-CM

## 2014-08-26 SURGERY — COLONOSCOPY WITH PROPOFOL
Anesthesia: Monitor Anesthesia Care

## 2014-08-26 MED ORDER — BUPROPION HCL ER (SR) 100 MG PO TB12: 100.0000 mg | ORAL_TABLET | Freq: Every day | ORAL | Status: AC

## 2014-08-26 NOTE — Patient Instructions (Signed)
I had a long discussion with the patient and her son regarding her memory loss, gait and balance difficulties and answered questions. I think she has mild cognitive impairment but there appears to be a component of suboptimally treated depression. I recommend trial of Wellbutrin SR 100 mg daily for a month and increase to twice daily if tolerated later. Continue Plavix for stroke prevention and maintain strict control of hypertension with blood pressure goal below 130/90, diabetes with hemoglobin A1c goal below 6.0 and lipids with LDL cholesterol goal below 70 mg percent. I strongly encouraged her to use a walker at all times to avoid falls and injuries. Return for follow-up in 3 months or call earlier if necessary.

## 2014-08-26 NOTE — Progress Notes (Signed)
[facility] [address] [place]. [place] [address] [phone]       OFFICE FOLLOW UP VISIT NOTE  [name] Date of Birth:  Nov 12, 1934 Medical Record Number:  [id number]   Referring MD:  Blanchie Serve  Reason for Referral:   TIA  Initial Consult 04/22/2014 : 50 year Caucasian lady who had an episode of transient loss of consciousness and slurred speech and left-sided weakness on 02/21/14. Her son was talking to her on the phone and noticed that she was not responding speaking. He immediately drove to her house about 30 minutes later noticed that she was would respond to him but her speech was slurred and she had some left-sided weakness. She presented to [facility] She was felt to have had a stroke however MRI scan of the brain revealed no acute infarct. She was also found to have Escherichia coli Unrinary tract infection and treated with antibiotics. She had a history of recurrent bladder infections with confusion and a sensation following these in the past. She also had history of multiple falls. She had history of severe lumbar spinal stenosis with neurogenic claudication and had undergone prior surgery twice. The rest of her stroke workup was unremarkable including transthoracic echo which showed ejection fraction of 55-60% without any cardiac source of embolism. Carotid Doppler showed no significant extracranial stenosis. Lipid profile is significant for elevated LDL cholesterol of 105 mg percent  and elevated triglycerides for which she was started on fenofibrate and Lipitor. Hemoglobin A1c was 6.4. Patient had previous been on aspirin which which was switched to Plavix. She was in consultation by Dr. Doy Mince not by stroke team.. EEG was not done.Son is unable to tell me if   she has been tried on medications like Aricept or Namenda in the past. She however has had improvement in her mental status and is back to baseline. She has no  residual weakness. She  was walking with a cane with multiple falls and recently she has been switched to a walker. She is currently getting a home physical therapy were working with advocating her to use a walker correctly. She yet has had 2 falls this week already. She has no known prior history of strokes TIAs seizures. It is unclear to me whether she's had dementia workup in the past or treatment..Behavioural disturbance was documented in her chart and she was advised to stay on Seroquel.   Update 08/26/2014 : She returns for follow-up to see me after last visit 4 months ago. She is accompanied by her daughter provides history. Patient was actually seen on 06/10/14 by Dr. Jaynee Eagles urgently as she complained of increasing left leg weakness and was found on MRI to have acute on chronic right ACA territory infarct occluded left ACA in the A2 segment. Widespread anterior circulation atherosclerosis, with moderate left MCA M1 and right supra clinoid ICA stenoses. She was advised to take aspirin and Plavix for 3 months and aggressive medical risk factor control. She has noticed some improvement but states that the left leg still wobbles and she can fall easily and needs a walker all the time. She has also had intermittent confusion and behavioral disturbances and memory loss which seem to be more pronounced when she has recurrent UTI. She had normal vitamin B12, TSH and RPR in March 2015. She has been diagnosed with renal stones and sees the urologist Dr. Janice Norrie. She is currently on Klonopin and cervical from her primary physician. On Mini-Mental testing today  she scored 23/30 and scored 8 on the geriatric depression scale. She is not been tried on any antidepressants. ROS:   14 system review of systems is positive for  urination problems, incontinence, easy bruising, , memory loss, confusion, , depression, anxiety, decreased energy. and all other systems negative PMH:  Past Medical History  Diagnosis Date  . Coronary artery disease      stents x 2, sees Dr. Cecille Aver practice  . Congestive heart failure     2012  . Myocardial infarction     "unsure of when"  . Hypertension   . Diabetes mellitus   . Shortness of breath   . Anemia   . Blood transfusion     when had hysterectomy  . Hypothyroidism     has had hx of treatment  . Urinary frequency   . Urinary tract infection     hx of  . GERD (gastroesophageal reflux disease)   . H/O hiatal hernia   . Arthritis   . Depression   . Spinal stenosis     with bone spurs and ruptured disc  . Cataract   . Blood transfusion without reported diagnosis   . Complications affecting other specified body systems, hypertension   . Other malaise and fatigue   . Dysuria   . Unspecified vitamin D deficiency   . Anxiety state, unspecified   . Other B-complex deficiencies   . Abnormality of gait   . Other and unspecified hyperlipidemia     Social History:  History   Social History  . Marital Status: Divorced    Spouse Name: N/A    Number of Children: 3  . Years of Education: 8th   Occupational History  . retired Other   Social History Main Topics  . Smoking status: Former Smoker    Types: Cigarettes  . Smokeless tobacco: Current User    Types: Snuff     Comment: 40 years ago  . Alcohol Use: No  . Drug Use: No  . Sexual Activity: No   Other Topics Concern  . Not on file   Social History Narrative   Patient lives at home alone.   Patient is right handed.   Caffeine use: none    Medications:   Current Outpatient Prescriptions on File Prior to Visit  Medication Sig Dispense Refill  . atorvastatin (LIPITOR) 10 MG tablet Take 1 tablet (10 mg total) by mouth daily at 6 PM. 90 tablet 1  . Calcium Carbonate-Vitamin D (CALCIUM + D PO) Take 1 tablet by mouth daily. Calcium 315 mg,Vitamin D3 250 I.U.    . cefUROXime (CEFTIN) 250 MG tablet Take 1 tablet (250 mg total) by mouth 2 (two) times daily with a meal. 8 tablet 0  . ciprofloxacin (CIPRO) 250 MG tablet   0    . clonazePAM (KLONOPIN) 0.5 MG tablet Take 1 tablet (0.5 mg total) by mouth 2 (two) times daily. 180 tablet 0  . clopidogrel (PLAVIX) 75 MG tablet Take 1 tablet (75 mg total) by mouth daily. 90 tablet 1  . diclofenac sodium (VOLTAREN) 1 % GEL Apply 4 g topically 2 (two) times daily as needed (for knee pain).    . fenofibrate 54 MG tablet Take 1 tablet (54 mg total) by mouth daily. 90 tablet 1  . ferrous sulfate 325 (65 FE) MG tablet Take 1 tablet (325 mg total) by mouth daily with breakfast. 90 tablet 1  . fesoterodine (TOVIAZ) 4 MG TB24 tablet Take 1 tablet (4  mg total) by mouth daily. 90 tablet 1  . fish oil-omega-3 fatty acids 1000 MG capsule Take 1 g by mouth daily.     . fluconazole (DIFLUCAN) 100 MG tablet     . hydrALAZINE (APRESOLINE) 25 MG tablet Take 1 tablet (25 mg total) by mouth 3 (three) times daily. 270 tablet 1  . losartan-hydrochlorothiazide (HYZAAR) 50-12.5 MG per tablet Take 1 tablet by mouth daily. 90 tablet 1  . metFORMIN (GLUCOPHAGE) 500 MG tablet Take 1 tablet (500 mg total) by mouth daily with breakfast. 90 tablet 3  . metoprolol (LOPRESSOR) 100 MG tablet TAKE 1 TABLET (100 MG TOTAL) BY MOUTH 2 (TWO) TIMES DAILY. 180 tablet 1  . MOVIPREP 100 G SOLR Take 1 kit (200 g total) by mouth once. 1 kit 0  . nitroGLYCERIN (NITROSTAT) 0.4 MG SL tablet Place 0.4 mg under the tongue every 5 (five) minutes as needed for chest pain. Max 3 tablets in 15 minutes    . nystatin-triamcinolone ointment (MYCOLOG) Apply 1 application topically 2 (two) times daily. 60 g 1  . phenazopyridine (PYRIDIUM) 100 MG tablet Take 1 tablet (100 mg total) by mouth 3 (three) times daily as needed for pain. 10 tablet 0  . polyethylene glycol (MIRALAX / GLYCOLAX) packet Take 17 g by mouth daily as needed (pain).    . QUEtiapine (SEROQUEL) 25 MG tablet 1 by mouth in the morning and 2 by mouth in the evening (Patient taking differently: Take 25-50 mg by mouth 2 (two) times daily. 1 by mouth in the morning and 2 by  mouth in the evening) 270 tablet 1  . senna-docusate (SENNA S) 8.6-50 MG per tablet Take 1 tablet by mouth 2 (two) times daily.    . [DISCONTINUED] oxybutynin (DITROPAN-XL) 5 MG 24 hr tablet Take one tablet by mouth once daily for bladder.     No current facility-administered medications on file prior to visit.    Allergies:   Allergies  Allergen Reactions  . Morphine And Related Other (See Comments)    Makes her crazy  . Enalapril Swelling    Facial swelling    Physical Exam General: obese elderly Caucasian lady, seated, in no evident distress Head: head normocephalic and atraumatic. Orohparynx benign Neck: supple with no carotid or supraclavicular bruits Cardiovascular: regular rate and rhythm, no murmurs Musculoskeletal: obese Skin:  no rash/petichiae. 1 plus pedal edema present bilaterally. Vascular:  Normal pulses all extremities Filed Vitals:   08/26/14 1426  BP: 145/79  Pulse: 55    Neurologic Exam Mental Status: Awake and fully alert. Disriented to place and time. Recent and remote memory diminished. MMSE 23/30 .AFT 9 . Geriatric Depression scale  8 s/o mild depression. Attention span, concentration and fund of knowledge all poor.. Mood and affect appropriate.  Cranial Nerves: Fundoscopic exam reveals not done   Pupils equal, briskly reactive to light. Extraocular movements full without nystagmus. Visual fields full to confrontation. Hearing intact. Facial sensation intact. Face, tongue, palate moves normally and symmetrically.  Motor: Normal bulk and tone. Normal strength in all tested extremity muscles. Sensory.: intact to touch and pinprick and diminished  Vibratory sensation ankle down. Romberg's positive. Coordination: Rapid alternating movements normal in all extremities. Finger-to-nose and heel-to-shin performed accurately bilaterally. Gait and Station: Arises from chair with  difficulty. Stance is  Broad based slightly. Tends to fall backwards Gait demonstrates  small stride and mild imbalance . Uses a walker Reflexes: 1+ and symmetric. Toes downgoing.      ASSESSMENT: 41  year Caucasian lady with transient episode of confusion altered mental status and left-sided weakness in July 2015 possible TIA versus seizure and postictal confusion and weakness. Recurrent episodes of confusion in the setting of urinary tract infections likely mild cognitive impairment versus early dementia. Recurrent falls likely multifactorial due to combination of severe degenerative spine disease, diabetic neuropathy and cognitive impairment and obesity    PLAN: I had a long discussion with the patient and her son regarding her memory loss, gait and balance difficulties and answered questions. I think she has mild cognitive impairment but there appears to be a component of suboptimally treated depression. I recommend trial of Wellbutrin SR 100 mg daily for a month and increase to twice daily if tolerated later. Continue Plavix for stroke prevention and maintain strict control of hypertension with blood pressure goal below 130/90, diabetes with hemoglobin A1c goal below 6.0 and lipids with LDL cholesterol goal below 70 mg percent. I strongly encouraged her to use a walker at all times to avoid falls and injuries. Return for follow-up in 3 months or call earlier if necessary.    Note: This document was prepared with digital dictation and possible smart phrase technology. Any transcriptional errors that result from this process are unintentional.

## 2014-09-06 ENCOUNTER — Other Ambulatory Visit: Payer: Self-pay | Admitting: *Deleted

## 2014-09-06 MED ORDER — OXYCODONE-ACETAMINOPHEN 5-325 MG PO TABS: ORAL_TABLET | ORAL | Status: DC

## 2014-09-06 NOTE — Telephone Encounter (Signed)
Patient brought back Rx for oxycodone that Dr. Glade LloydPandey wrote at appointment 08/10/14 but didn't sign it. Reprinted and given to patient.

## 2014-09-27 IMAGING — CT CT HEAD W/O CM
1 series · 16 of 30 positions shown, 20 images · non-contrast
Comparison: Head CT 02/21/2014

CLINICAL DATA: Trauma, blunt trauma left-sided head

EXAM:
CT HEAD WITHOUT CONTRAST
TECHNIQUE: Contiguous axial images were obtained from the base of the skull
through the vertex without intravenous contrast.

[Series 2: head 5.0 h30s · axial · 0.44mm/px · z∈[+1312,+1447]mm · 16 of 31 slices shown, 20 images]
[im 2/31  brain]
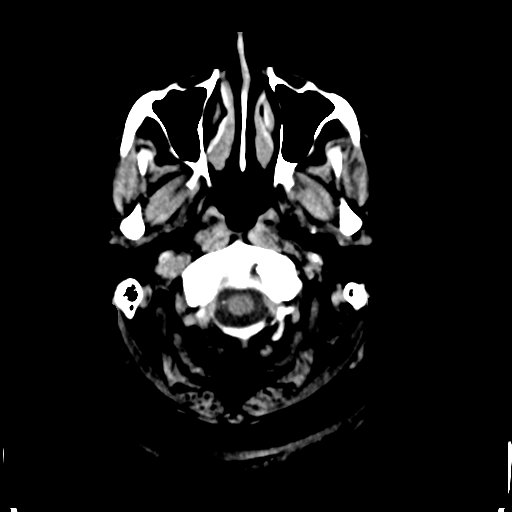
[im 2/31  bone]
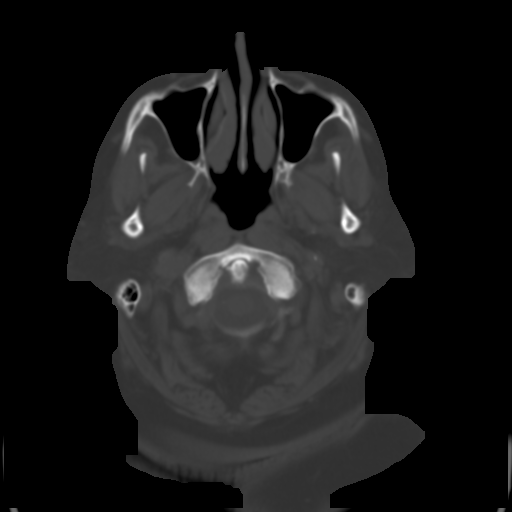
[im 4/31  brain]
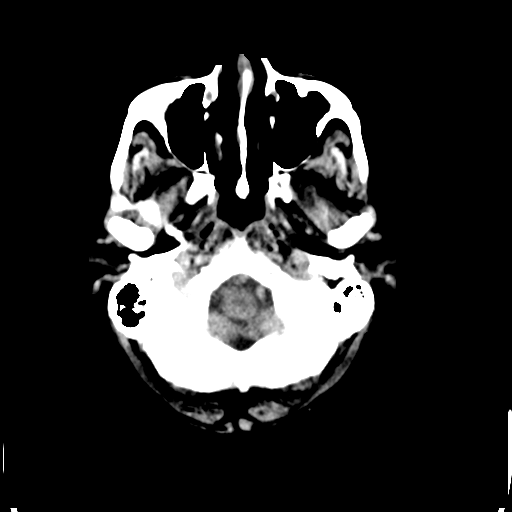
[im 6/31  brain]
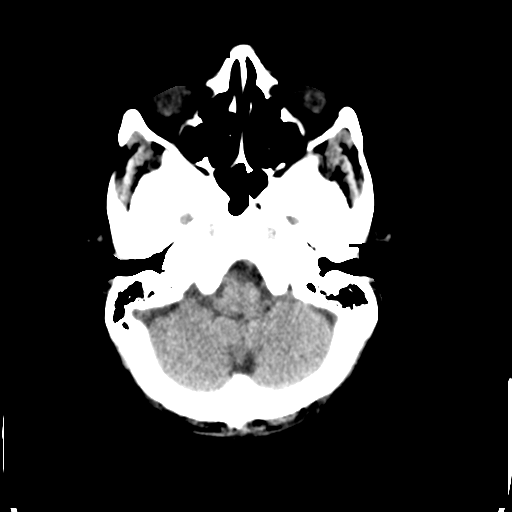
[im 8/31  brain]
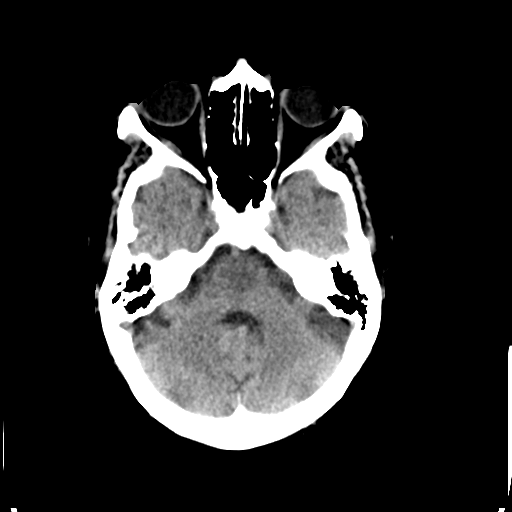
[im 9/31  brain]
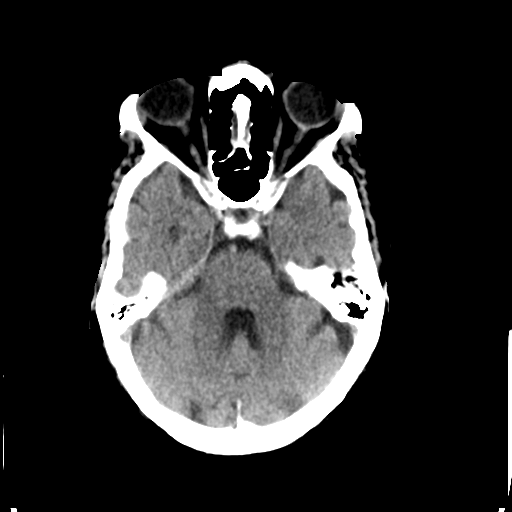
[im 9/31  bone]
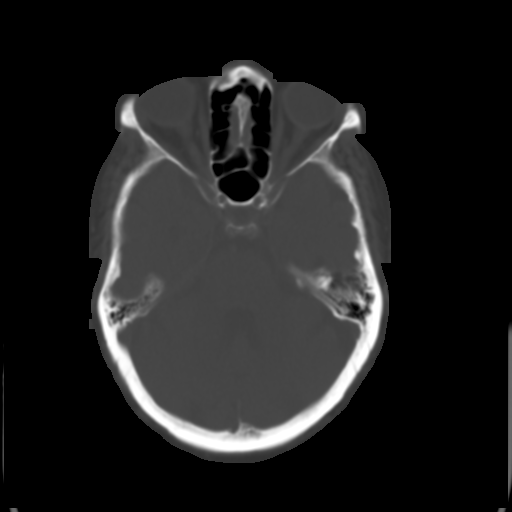
[im 11/31  brain]
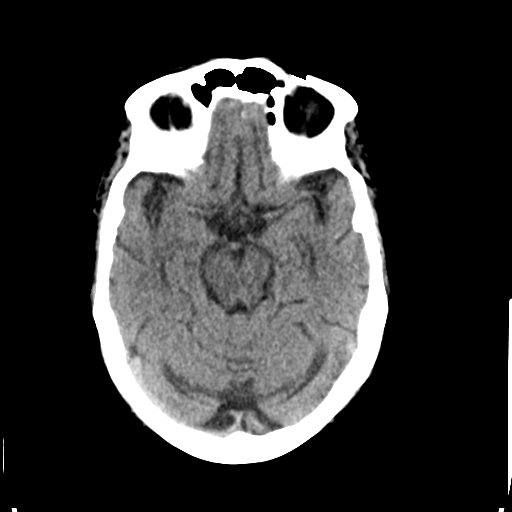
[im 13/31  brain]
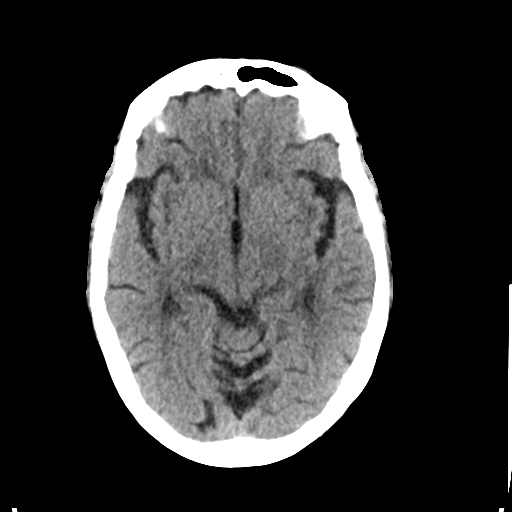
[im 15/31  brain]
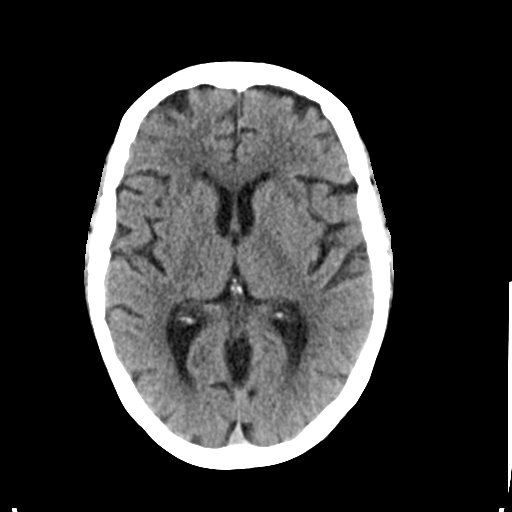
[im 16/31  brain]
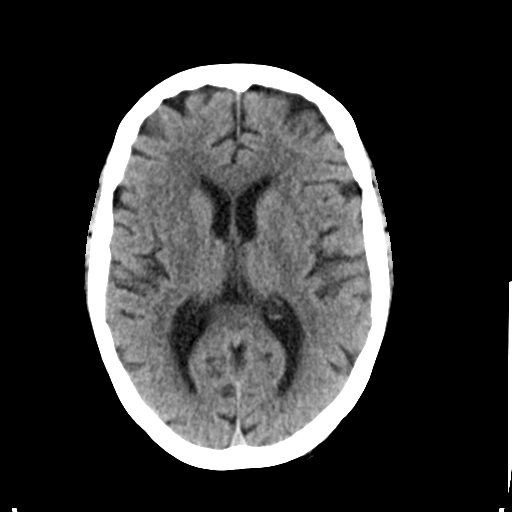
[im 16/31  bone]
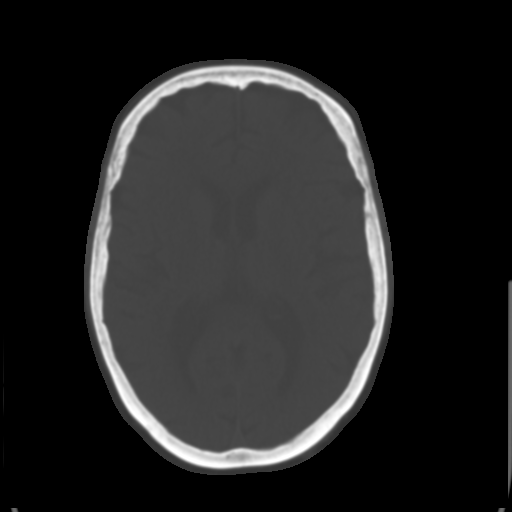
[im 18/31  brain]
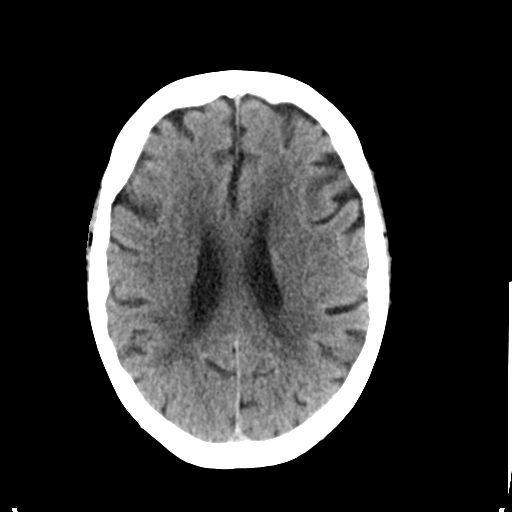
[im 20/31  brain]
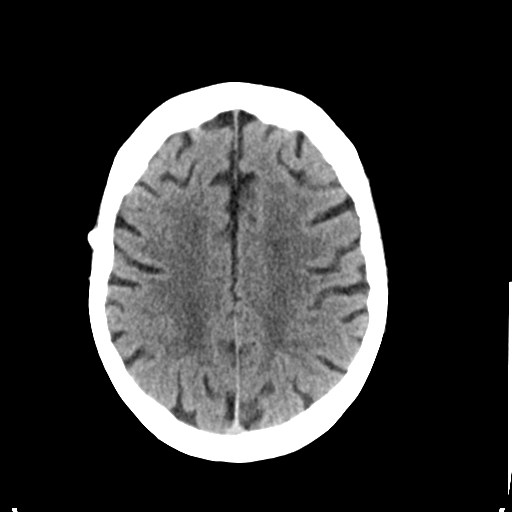
[im 22/31  brain]
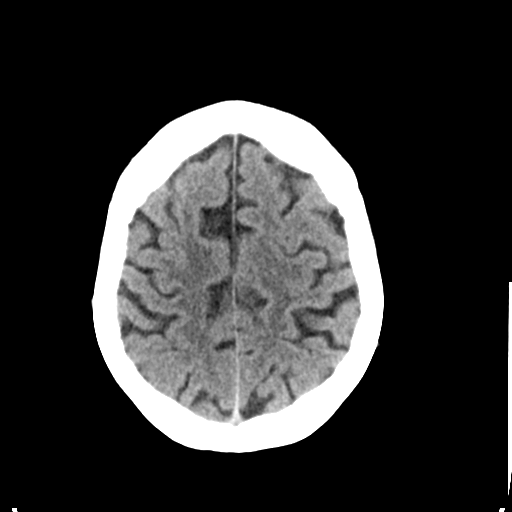
[im 23/31  brain]
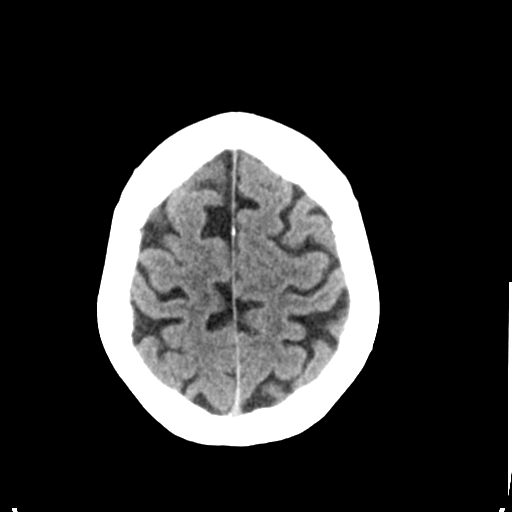
[im 23/31  bone]
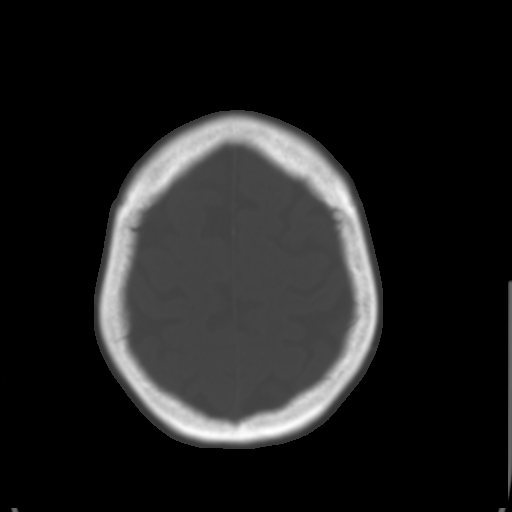
[im 25/31  brain]
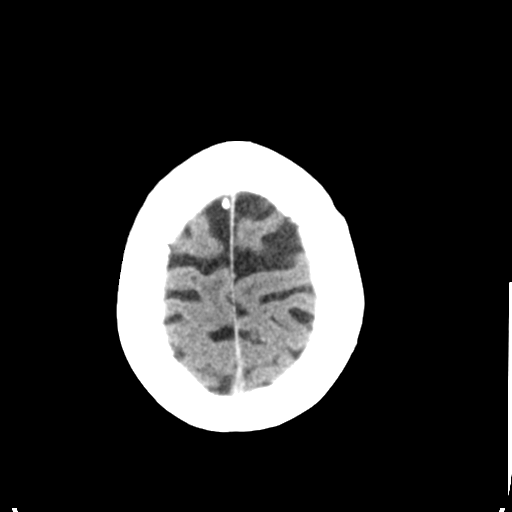
[im 27/31  brain]
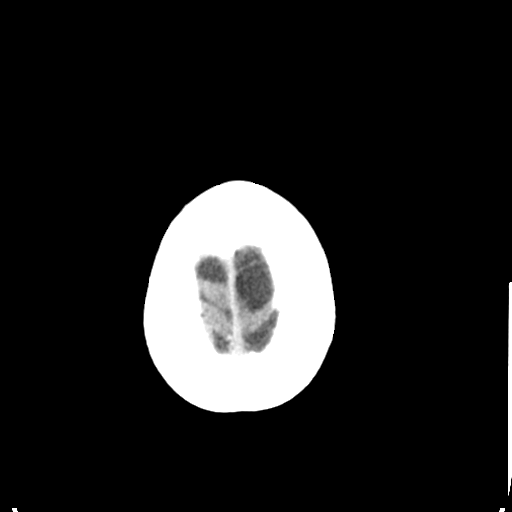
[im 29/31  brain]
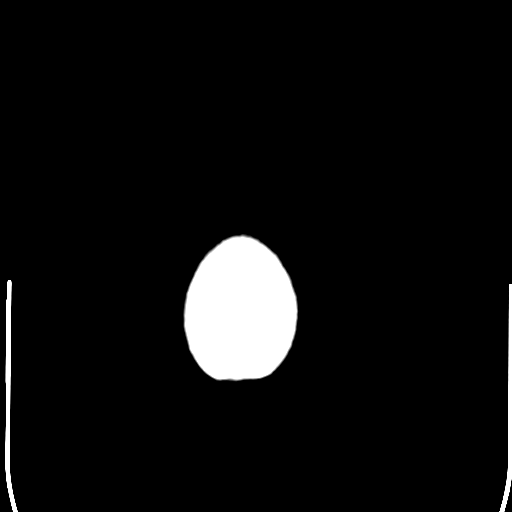

[16 of 30 positions shown; findings below may reference images not displayed]

FINDINGS: No intracranial hemorrhage. No parenchymal contusion. No midline
shift or mass effect. Basilar cisterns are patent. No skull base
fracture. No fluid in the paranasal sinuses . Orbits are normal.

There is mild periventricular subcortical white matter hypodensities
unchanged prior.

Small amount of fluid in the left mastoid air cells, unchanged from
prior
IMPRESSION: 1. No intracranial trauma.
2. No change from prior

## 2014-10-12 ENCOUNTER — Encounter: Payer: Self-pay | Admitting: Internal Medicine

## 2014-10-18 DIAGNOSIS — N200 Calculus of kidney: Secondary | ICD-10-CM | POA: Diagnosis not present

## 2014-10-18 DIAGNOSIS — N390 Urinary tract infection, site not specified: Secondary | ICD-10-CM | POA: Diagnosis not present

## 2014-10-25 ENCOUNTER — Telehealth: Payer: Self-pay | Admitting: Neurology

## 2014-10-25 DIAGNOSIS — N390 Urinary tract infection, site not specified: Secondary | ICD-10-CM | POA: Diagnosis not present

## 2014-10-25 DIAGNOSIS — N200 Calculus of kidney: Secondary | ICD-10-CM | POA: Diagnosis not present

## 2014-10-25 NOTE — Telephone Encounter (Signed)
There actually isn't anything to correct on our end.  The pharmacy dispensed the wrong med in error and this was an FYI for provider.  Rx was sent for Bupropion SR and they dispensed regular Bupropion in error.  They have corrected their mistake and spoke with patient/caregiver regarding the error.  Thank you.

## 2014-10-25 NOTE — Telephone Encounter (Signed)
Shanda BumpsJessica can you correct prescription for Bupropion.

## 2014-10-25 NOTE — Telephone Encounter (Signed)
Abigail Brooks with [facility] is call to report that Rx Butrotion was filled incorrectly.  Sent Rx for SR 100 and was filled with plain 100.  [facility] did call patient's son.

## 2014-10-26 ENCOUNTER — Telehealth: Payer: Self-pay | Admitting: Neurology

## 2014-10-26 ENCOUNTER — Other Ambulatory Visit: Payer: Self-pay | Admitting: *Deleted

## 2014-10-26 MED ORDER — OXYCODONE-ACETAMINOPHEN 5-325 MG PO TABS: ORAL_TABLET | ORAL | Status: DC

## 2014-10-26 NOTE — Telephone Encounter (Signed)
Bernard - pharmacist, needs a call back from Dr. Pearlean BrownieSethi regarding  bupropion sustained release (immediate release was filled instead) - Patient has not taken medication for 2 days. Please have Dr. Pearlean BrownieSethi call the pharmacist at [phone]

## 2014-10-26 NOTE — Telephone Encounter (Signed)
Thanks Jessica!! :) 

## 2014-10-26 NOTE — Telephone Encounter (Signed)
I called back and spoke with the pharmacist.  She said Abigail Brooks is not even working today, so thinks this is a duplicate message from yesterday.  She shows they have already corrected their error and have filled the Rx as prescribed.  Says nothing further is needed.

## 2014-10-26 NOTE — Telephone Encounter (Addendum)
[facility] in regard Bupropion 100mg . Saw your note from yesterday.  He is needing a call back as to whether or onto patient should continue taking..Marland Kitchen

## 2014-10-26 NOTE — Telephone Encounter (Signed)
Patient son requested and will pick up 

## 2014-10-27 NOTE — Telephone Encounter (Signed)
Message was forwarded to me today, however, we called regarding this yesterday and the day before.  I called back again.  Pharmacist reiterated the Rx was corrected and filled as prescribed.  Says nothing further is needed at this time.

## 2014-11-07 ENCOUNTER — Other Ambulatory Visit: Payer: Self-pay | Admitting: Internal Medicine

## 2014-11-09 ENCOUNTER — Emergency Department (HOSPITAL_COMMUNITY): Payer: Medicare Other

## 2014-11-09 ENCOUNTER — Encounter (HOSPITAL_COMMUNITY): Payer: Self-pay

## 2014-11-09 ENCOUNTER — Emergency Department (HOSPITAL_COMMUNITY)
Admission: EM | Admit: 2014-11-09 | Discharge: 2014-11-09 | Disposition: A | Discharge status: Home / Self Care | Payer: Medicare Other | Attending: Emergency Medicine | Admitting: Emergency Medicine

## 2014-11-09 DIAGNOSIS — S3992XA Unspecified injury of lower back, initial encounter: Secondary | ICD-10-CM | POA: Diagnosis not present

## 2014-11-09 DIAGNOSIS — Z79899 Other long term (current) drug therapy: Secondary | ICD-10-CM | POA: Diagnosis not present

## 2014-11-09 DIAGNOSIS — M545 Low back pain, unspecified: Secondary | ICD-10-CM

## 2014-11-09 DIAGNOSIS — D649 Anemia, unspecified: Secondary | ICD-10-CM | POA: Diagnosis not present

## 2014-11-09 DIAGNOSIS — Z862 Personal history of diseases of the blood and blood-forming organs and certain disorders involving the immune mechanism: Secondary | ICD-10-CM | POA: Diagnosis not present

## 2014-11-09 DIAGNOSIS — M1990 Unspecified osteoarthritis, unspecified site: Secondary | ICD-10-CM | POA: Diagnosis not present

## 2014-11-09 DIAGNOSIS — Z7902 Long term (current) use of antithrombotics/antiplatelets: Secondary | ICD-10-CM | POA: Diagnosis not present

## 2014-11-09 DIAGNOSIS — Y998 Other external cause status: Secondary | ICD-10-CM | POA: Insufficient documentation

## 2014-11-09 DIAGNOSIS — F329 Major depressive disorder, single episode, unspecified: Secondary | ICD-10-CM | POA: Diagnosis not present

## 2014-11-09 DIAGNOSIS — M546 Pain in thoracic spine: Secondary | ICD-10-CM | POA: Diagnosis not present

## 2014-11-09 DIAGNOSIS — I2510 Atherosclerotic heart disease of native coronary artery without angina pectoris: Secondary | ICD-10-CM | POA: Insufficient documentation

## 2014-11-09 DIAGNOSIS — K219 Gastro-esophageal reflux disease without esophagitis: Secondary | ICD-10-CM | POA: Insufficient documentation

## 2014-11-09 DIAGNOSIS — Z87891 Personal history of nicotine dependence: Secondary | ICD-10-CM | POA: Diagnosis not present

## 2014-11-09 DIAGNOSIS — W1839XA Other fall on same level, initial encounter: Secondary | ICD-10-CM | POA: Insufficient documentation

## 2014-11-09 DIAGNOSIS — S299XXA Unspecified injury of thorax, initial encounter: Secondary | ICD-10-CM | POA: Diagnosis not present

## 2014-11-09 DIAGNOSIS — Z791 Long term (current) use of non-steroidal anti-inflammatories (NSAID): Secondary | ICD-10-CM | POA: Insufficient documentation

## 2014-11-09 DIAGNOSIS — E119 Type 2 diabetes mellitus without complications: Secondary | ICD-10-CM | POA: Insufficient documentation

## 2014-11-09 DIAGNOSIS — I10 Essential (primary) hypertension: Secondary | ICD-10-CM | POA: Diagnosis not present

## 2014-11-09 DIAGNOSIS — S3993XA Unspecified injury of pelvis, initial encounter: Secondary | ICD-10-CM | POA: Diagnosis not present

## 2014-11-09 DIAGNOSIS — F419 Anxiety disorder, unspecified: Secondary | ICD-10-CM | POA: Diagnosis not present

## 2014-11-09 DIAGNOSIS — Y9389 Activity, other specified: Secondary | ICD-10-CM | POA: Insufficient documentation

## 2014-11-09 DIAGNOSIS — I252 Old myocardial infarction: Secondary | ICD-10-CM | POA: Diagnosis not present

## 2014-11-09 DIAGNOSIS — Y9289 Other specified places as the place of occurrence of the external cause: Secondary | ICD-10-CM | POA: Diagnosis not present

## 2014-11-09 DIAGNOSIS — Z792 Long term (current) use of antibiotics: Secondary | ICD-10-CM | POA: Insufficient documentation

## 2014-11-09 DIAGNOSIS — I509 Heart failure, unspecified: Secondary | ICD-10-CM | POA: Insufficient documentation

## 2014-11-09 DIAGNOSIS — W19XXXA Unspecified fall, initial encounter: Secondary | ICD-10-CM

## 2014-11-09 MED ORDER — OXYCODONE-ACETAMINOPHEN 5-325 MG PO TABS
2.0000 | ORAL_TABLET | Freq: Once | ORAL | Status: AC
  Administered 2014-11-09: 2 via ORAL
  Filled 2014-11-09: qty 2

## 2014-11-09 MED ORDER — IBUPROFEN 400 MG PO TABS: 400.0000 mg | ORAL_TABLET | Freq: Four times a day (QID) | ORAL | Status: DC | PRN

## 2014-11-09 NOTE — ED Notes (Signed)
Pt ambulating in house.  Pt slipped on floor.  Pt did not have her walker.  Pt fell to back.  Did not strike head.  No LOC.  C/O back pain.

## 2014-11-09 NOTE — Progress Notes (Signed)
EDCM spoke to patient and her son Abigail Brooks at bedside.  Patient confirms she lives alone at home.  "My son takes very good care of me."  Patient has a walker, cane, bedside commode, shower chair and grab rails in the bathroom.  Patient's son reports patient's pcp is Dr. Abbey ChattersJessica Eubanks.  Patient's son also reports patient has a neurologist, neurosurgeon and urologist.  [facility].  Patient's son reports, "She gets around pretty well, I don't think she would have fallen if she had her walker. She didn't have her walker with her."  Patient reports, "I exercise everyday."  Patient politely declined home health services at this time.  No further EDCM needs at this time.

## 2014-11-09 NOTE — ED Notes (Signed)
Pt ambulating independently w/ steady gait on d/c in no acute distress, A&Ox4. D/c instructions reviewed w/ pt and family - pt and family deny any further questions or concerns at present. Rx given x1  

## 2014-11-09 NOTE — Discharge Instructions (Signed)
Please follow-up with your primary care provider in 2 days for recheck symptoms. The emergency room immediately if new or worsening symptoms present. Please usual walker when ambulating.

## 2014-11-09 NOTE — ED Provider Notes (Signed)
80 year old female who fell just prior to arrival while ambulating in her house, suspects that she slipped on a grease spot, landed on her lower back, has pain in the lumbar area in the pelvis, stable pelvis, can straight leg raise bilaterally, has tenderness in the paraspinal muscles, imaging reviewed and shows no signs of fractures or dislocations, stable for discharge, patient informed of the treatment plan and is in agreement.  Medical screening examination/treatment/procedure(s) were conducted as a shared visit with non-physician practitioner(s) and myself.  I personally evaluated the patient during the encounter.  Clinical Impression:   Final diagnoses:  Midline low back pain without sciatica  Fall, initial encounter         Eber HongBrian Raven Harmes, MD 11/10/14 1003

## 2014-11-09 NOTE — ED Provider Notes (Signed)
CSN: [id number]     Arrival date & time 11/09/14  1655 History   First MD Initiated Contact with Patient 11/09/14 1802     Chief Complaint  Patient presents with  . Fall  . Back Pain    HPI   80 year old female presents status post fall. Reports that she was walking without the aid of her walker this morning when she slipped on grease in her kitchen falling backwards landing on her back. She reports immediate sharp pain to her lumbar and thoracic areas with radiation down into her hips. Patient reports significant history of stroke with right-sided weaknesses. She denies additional changes and lower extremity strength, sensation, perfusion. She denies radiation of pain down her distal extremities. Patient reports she is incontinent due to her previous strokes, and reports no changes in her urinary habits; no urinary retention. Denies saddle anesthesia. Patient also notes abrasions to her anterior knees from crawling on the floor to get back to her chair. She was able to make to her feet on her own without assistance but continued to endorse extreme back pain. Was taking one of her Percocet with minimal relief of pain. Patient is here with her son who is her power of attorney. Patient denies dizziness, fatigue, headache, chest pain, nausea, abdominal pain, diaphoresis, abdominal pain or any other concerning signs or symptoms.   Past Medical History  Diagnosis Date  . Coronary artery disease     stents x 2, sees Dr. Cecille Aver practice  . Congestive heart failure     2012  . Myocardial infarction     "unsure of when"  . Hypertension   . Diabetes mellitus   . Shortness of breath   . Anemia   . Blood transfusion     when had hysterectomy  . Hypothyroidism     has had hx of treatment  . Urinary frequency   . Urinary tract infection     hx of  . GERD (gastroesophageal reflux disease)   . H/O hiatal hernia   . Arthritis   . Depression   . Spinal stenosis     with bone spurs and  ruptured disc  . Cataract   . Blood transfusion without reported diagnosis   . Complications affecting other specified body systems, hypertension   . Other malaise and fatigue   . Dysuria   . Unspecified vitamin D deficiency   . Anxiety state, unspecified   . Other B-complex deficiencies   . Abnormality of gait   . Other and unspecified hyperlipidemia    Past Surgical History  Procedure Laterality Date  . Cataract extraction w/ intraocular lens  implant, bilateral    . Bladder suspension    . Coronary      stents x 2, stent implant placed9/21/06  . Back surgery      ruptured disc, lumbar area  . Appendectomy    . Cardiac catheterization    . Lumbar laminectomy/decompression microdiscectomy  12/21/2011    Procedure: LUMBAR LAMINECTOMY/DECOMPRESSION MICRODISCECTOMY 1 LEVEL;  Surgeon: Charlie Pitter, MD;  Location: [facility];  Service: Neurosurgery;  Laterality: Bilateral;  Lumbar four-five decompressive lumbar laminectomy; Right Lumbar four-five microdiscectomy  . Eye surgery    . Spine surgery    . Abdominal hysterectomy  1986   Family History  Problem Relation Age of Onset  . Anesthesia problems Sister   . Anesthesia problems Son   . Thyroid cancer Son   . Heart disease Mother   . Diabetes Sister  History  Substance Use Topics  . Smoking status: Former Smoker    Types: Cigarettes  . Smokeless tobacco: Current User    Types: Snuff     Comment: 40 years ago  . Alcohol Use: No   OB History    No data available     Review of Systems  All other systems reviewed and are negative.   Allergies  Morphine and related and Enalapril  Home Medications   Prior to Admission medications   Medication Sig Start Date End Date Taking? Authorizing Provider  atorvastatin (LIPITOR) 10 MG tablet Take 1 tablet (10 mg total) by mouth daily at 6 PM. 08/10/14  Yes Mahima Bubba Camp, MD  buPROPion (WELLBUTRIN SR) 100 MG 12 hr tablet Take 1 tablet (100 mg total) by mouth daily. 08/26/14   Yes Garvin Fila, MD  Calcium Carbonate-Vitamin D (CALCIUM + D PO) Take 1 tablet by mouth daily. Calcium 315 mg,Vitamin D3 250 I.U.   Yes Historical Provider, MD  cephALEXin (KEFLEX) 250 MG capsule Take 250 mg by mouth daily.   Yes Historical Provider, MD  clonazePAM (KLONOPIN) 0.5 MG tablet TAKE 1 TABLET BY MOUTH 2 TIMES EVERY DAY 11/08/14  Yes Lauree Chandler, NP  clopidogrel (PLAVIX) 75 MG tablet Take 1 tablet (75 mg total) by mouth daily. 08/10/14  Yes Mahima Bubba Camp, MD  diclofenac sodium (VOLTAREN) 1 % GEL Apply 4 g topically 2 (two) times daily as needed (for knee pain).   Yes Historical Provider, MD  fenofibrate 54 MG tablet Take 1 tablet (54 mg total) by mouth daily. 08/10/14  Yes Mahima Bubba Camp, MD  ferrous sulfate 325 (65 FE) MG tablet Take 1 tablet (325 mg total) by mouth daily with breakfast. 08/10/14  Yes Mahima Pandey, MD  fesoterodine (TOVIAZ) 4 MG TB24 tablet Take 1 tablet (4 mg total) by mouth daily. 08/10/14  Yes Mahima Bubba Camp, MD  fish oil-omega-3 fatty acids 1000 MG capsule Take 1 g by mouth daily.    Yes Historical Provider, MD  hydrALAZINE (APRESOLINE) 25 MG tablet Take 1 tablet (25 mg total) by mouth 3 (three) times daily. 08/10/14  Yes Mahima Bubba Camp, MD  losartan-hydrochlorothiazide (HYZAAR) 50-12.5 MG per tablet Take 1 tablet by mouth daily. 08/10/14  Yes Mahima Bubba Camp, MD  metFORMIN (GLUCOPHAGE) 500 MG tablet Take 1 tablet (500 mg total) by mouth daily with breakfast. 08/10/14  Yes Mahima Pandey, MD  metoprolol (LOPRESSOR) 100 MG tablet TAKE 1 TABLET (100 MG TOTAL) BY MOUTH 2 (TWO) TIMES DAILY. 08/10/14  Yes Mahima Bubba Camp, MD  nitroGLYCERIN (NITROSTAT) 0.4 MG SL tablet Place 0.4 mg under the tongue every 5 (five) minutes as needed for chest pain. Max 3 tablets in 15 minutes   Yes Historical Provider, MD  nystatin-triamcinolone ointment (MYCOLOG) Apply 1 application topically 2 (two) times daily. 08/10/14  Yes Blanchie Serve, MD  oxyCODONE-acetaminophen (PERCOCET/ROXICET)  5-325 MG per tablet Take one to two tablets by mouth every 8 hours as needed for severe pain 10/26/14  Yes Estill Dooms, MD  phenazopyridine (PYRIDIUM) 100 MG tablet Take 1 tablet (100 mg total) by mouth 3 (three) times daily as needed for pain. 04/13/14  Yes Mahima Bubba Camp, MD  polyethylene glycol (MIRALAX / GLYCOLAX) packet Take 17 g by mouth daily as needed (pain).   Yes Historical Provider, MD  QUEtiapine (SEROQUEL) 25 MG tablet 1 by mouth in the morning and 2 by mouth in the evening Patient taking differently: Take 25-50 mg by mouth 2 (two) times daily. 1 by  mouth in the morning and 2 by mouth in the evening 08/10/14  Yes Mahima Bubba Camp, MD  senna-docusate (SENNA S) 8.6-50 MG per tablet Take 1 tablet by mouth 2 (two) times daily.   Yes Historical Provider, MD  ibuprofen (ADVIL,MOTRIN) 400 MG tablet Take 1 tablet (400 mg total) by mouth every 6 (six) hours as needed. 11/09/14   Okey Regal, PA-C  MOVIPREP 100 G SOLR Take 1 kit (200 g total) by mouth once. Patient not taking: Reported on 11/09/2014 07/12/14   Janett Billow D Zehr, PA-C   BP 159/70 mmHg  Pulse 65  Temp(Src) 98.1 F (36.7 C) (Oral)  Resp 20  SpO2 94% Physical Exam  Constitutional: She is oriented to person, place, and time. She appears well-developed and well-nourished.  HENT:  Head: Normocephalic and atraumatic.  Eyes: Pupils are equal, round, and reactive to light.  Neck: Normal range of motion. Neck supple. No JVD present. No tracheal deviation present. No thyromegaly present.  Cardiovascular: Normal rate, regular rhythm, normal heart sounds and intact distal pulses.  Exam reveals no gallop and no friction rub.   No murmur heard. Pulmonary/Chest: Effort normal and breath sounds normal. No stridor. No respiratory distress. She has no wheezes. She has no rales. She exhibits no tenderness.  Abdominal: Soft. She exhibits no distension and no mass. There is no tenderness. There is no rebound and no guarding.  Musculoskeletal: Normal  range of motion.  No C-spine tenderness, T-spine/L-spine tenderness to palpation. No signs of trauma, rashes, bruising.  Full pain-free range of motion of neck, shoulders, hips, knees, ankles.  Abrasions to the anterior knee, no soft tissue swelling.  No changes in baseline sensation function of lower extremities.  Lymphadenopathy:    She has no cervical adenopathy.  Neurological: She is alert and oriented to person, place, and time. Coordination normal.  Skin: Skin is warm and dry.  Psychiatric: She has a normal mood and affect. Her behavior is normal. Judgment and thought content normal.  Nursing note and vitals reviewed.   ED Course  Procedures (including critical care time) Labs Review Labs Reviewed - No data to display  Imaging Review Dg Thoracic Spine 2 View  11/09/2014   CLINICAL DATA:  80 year old female with history of trauma from a fall this morning contour back complaining of a upper back pain.  EXAM: THORACIC SPINE - 2 VIEW  COMPARISON:  No priors.  FINDINGS: Two views of the thoracic spine demonstrate no definite acute displaced fracture or compression type fracture. There is severe multilevel degenerative disc disease, with multiple flowing anterior osteophytes throughout the thoracic spine, which may suggest diffuse idiopathic skeletal hyperostosis (DISH).  IMPRESSION: 1. No acute radiographic abnormality of the thoracic spine. 2. Multilevel degenerative disc disease in the thoracic spine. 3. Possible DISH.   Electronically Signed   By: Vinnie Langton M.D.   On: 11/09/2014 18:43   Dg Lumbar Spine Complete  11/09/2014   CLINICAL DATA:  Fall, low back pain  EXAM: LUMBAR SPINE - COMPLETE 4+ VIEW  COMPARISON:  None.  FINDINGS: Five lumbar type vertebral bodies.  Straightening of the normal lumbar lordosis.  No evidence of fracture or dislocation. Vertebral body heights are maintained.  Moderate multilevel degenerative changes, most prominent at L4-5.  Visualized bony pelvis  appears intact.  IMPRESSION: No fracture or dislocation is seen.  Moderate degenerative changes.   Electronically Signed   By: Julian Hy M.D.   On: 11/09/2014 18:54   Dg Pelvis 1-2 Views  11/09/2014  CLINICAL DATA:  Fall, low back pain  EXAM: PELVIS - 1-2 VIEW  COMPARISON:  08/21/2014  FINDINGS: No fracture or dislocation is seen.  Bilateral hip joint spaces are narrowed but symmetric.  Degenerative changes of the pubic symphysis.  Moderate degenerative changes of the lower lumbar spine.  IMPRESSION: No fracture or dislocation is seen.   Electronically Signed   By: Julian Hy M.D.   On: 11/09/2014 18:55     EKG Interpretation None      MDM   Final diagnoses:  Midline low back pain without sciatica  Fall, initial encounter   Therapeutics: Percocet; improvement of pain Imaging: DG thoracic spine, DG lumbar, DG pelvis  Assessment/Plan: No acute findings on exam or imaging. Pt found pain relief with percocet for which she already has a prescription for. She was ambulated and had minimal back pain or difficulty. She was instructed to rest, use ice, and ibuprofen as needed for pain. Instructed to use walker at all times. Pt and son understood and agreed to plan.      Okey Regal, PA-C 11/10/14 [place], MD 11/10/14 [phone]

## 2014-11-11 ENCOUNTER — Other Ambulatory Visit: Payer: Self-pay | Admitting: *Deleted

## 2014-11-11 MED ORDER — CLONAZEPAM 0.5 MG PO TABS: ORAL_TABLET | ORAL | Status: DC

## 2014-11-11 NOTE — Telephone Encounter (Signed)
[facility] fax order. Did not receive previous Rx

## 2014-11-14 ENCOUNTER — Encounter (HOSPITAL_COMMUNITY): Payer: Self-pay | Admitting: Emergency Medicine

## 2014-11-14 ENCOUNTER — Emergency Department (HOSPITAL_COMMUNITY): Payer: Medicare Other

## 2014-11-14 ENCOUNTER — Inpatient Hospital Stay (HOSPITAL_COMMUNITY)
Admission: EM | Admit: 2014-11-14 | Discharge: 2014-11-18 | DRG: 064 | Disposition: A | Discharge status: Skilled Nursing Facility | Payer: Medicare Other | Attending: Internal Medicine | Admitting: Internal Medicine

## 2014-11-14 ENCOUNTER — Inpatient Hospital Stay (HOSPITAL_COMMUNITY): Payer: Medicare Other

## 2014-11-14 DIAGNOSIS — G936 Cerebral edema: Secondary | ICD-10-CM | POA: Diagnosis present

## 2014-11-14 DIAGNOSIS — F03918 Unspecified dementia, unspecified severity, with other behavioral disturbance: Secondary | ICD-10-CM | POA: Diagnosis present

## 2014-11-14 DIAGNOSIS — Z885 Allergy status to narcotic agent status: Secondary | ICD-10-CM

## 2014-11-14 DIAGNOSIS — M1990 Unspecified osteoarthritis, unspecified site: Secondary | ICD-10-CM | POA: Diagnosis present

## 2014-11-14 DIAGNOSIS — R269 Unspecified abnormalities of gait and mobility: Secondary | ICD-10-CM | POA: Diagnosis not present

## 2014-11-14 DIAGNOSIS — Z955 Presence of coronary angioplasty implant and graft: Secondary | ICD-10-CM | POA: Diagnosis not present

## 2014-11-14 DIAGNOSIS — Z90710 Acquired absence of both cervix and uterus: Secondary | ICD-10-CM | POA: Diagnosis not present

## 2014-11-14 DIAGNOSIS — D649 Anemia, unspecified: Secondary | ICD-10-CM | POA: Diagnosis present

## 2014-11-14 DIAGNOSIS — N183 Chronic kidney disease, stage 3 unspecified: Secondary | ICD-10-CM | POA: Diagnosis present

## 2014-11-14 DIAGNOSIS — F329 Major depressive disorder, single episode, unspecified: Secondary | ICD-10-CM | POA: Diagnosis present

## 2014-11-14 DIAGNOSIS — E559 Vitamin D deficiency, unspecified: Secondary | ICD-10-CM | POA: Diagnosis present

## 2014-11-14 DIAGNOSIS — I69398 Other sequelae of cerebral infarction: Secondary | ICD-10-CM | POA: Diagnosis not present

## 2014-11-14 DIAGNOSIS — Z87891 Personal history of nicotine dependence: Secondary | ICD-10-CM

## 2014-11-14 DIAGNOSIS — Z888 Allergy status to other drugs, medicaments and biological substances status: Secondary | ICD-10-CM | POA: Diagnosis not present

## 2014-11-14 DIAGNOSIS — I2510 Atherosclerotic heart disease of native coronary artery without angina pectoris: Secondary | ICD-10-CM | POA: Diagnosis present

## 2014-11-14 DIAGNOSIS — I639 Cerebral infarction, unspecified: Secondary | ICD-10-CM | POA: Diagnosis present

## 2014-11-14 DIAGNOSIS — S0990XA Unspecified injury of head, initial encounter: Secondary | ICD-10-CM | POA: Diagnosis not present

## 2014-11-14 DIAGNOSIS — G8194 Hemiplegia, unspecified affecting left nondominant side: Secondary | ICD-10-CM | POA: Diagnosis present

## 2014-11-14 DIAGNOSIS — M6281 Muscle weakness (generalized): Secondary | ICD-10-CM | POA: Diagnosis not present

## 2014-11-14 DIAGNOSIS — K219 Gastro-esophageal reflux disease without esophagitis: Secondary | ICD-10-CM | POA: Diagnosis present

## 2014-11-14 DIAGNOSIS — R488 Other symbolic dysfunctions: Secondary | ICD-10-CM | POA: Diagnosis not present

## 2014-11-14 DIAGNOSIS — R001 Bradycardia, unspecified: Secondary | ICD-10-CM | POA: Diagnosis not present

## 2014-11-14 DIAGNOSIS — Z79899 Other long term (current) drug therapy: Secondary | ICD-10-CM | POA: Diagnosis not present

## 2014-11-14 DIAGNOSIS — M25569 Pain in unspecified knee: Secondary | ICD-10-CM | POA: Diagnosis not present

## 2014-11-14 DIAGNOSIS — M6289 Other specified disorders of muscle: Secondary | ICD-10-CM | POA: Diagnosis not present

## 2014-11-14 DIAGNOSIS — E1129 Type 2 diabetes mellitus with other diabetic kidney complication: Secondary | ICD-10-CM | POA: Diagnosis not present

## 2014-11-14 DIAGNOSIS — E860 Dehydration: Secondary | ICD-10-CM | POA: Diagnosis not present

## 2014-11-14 DIAGNOSIS — E539 Vitamin B deficiency, unspecified: Secondary | ICD-10-CM | POA: Diagnosis present

## 2014-11-14 DIAGNOSIS — E1122 Type 2 diabetes mellitus with diabetic chronic kidney disease: Secondary | ICD-10-CM | POA: Diagnosis present

## 2014-11-14 DIAGNOSIS — E1121 Type 2 diabetes mellitus with diabetic nephropathy: Secondary | ICD-10-CM | POA: Diagnosis present

## 2014-11-14 DIAGNOSIS — Z8249 Family history of ischemic heart disease and other diseases of the circulatory system: Secondary | ICD-10-CM

## 2014-11-14 DIAGNOSIS — E785 Hyperlipidemia, unspecified: Secondary | ICD-10-CM | POA: Insufficient documentation

## 2014-11-14 DIAGNOSIS — Z87440 Personal history of urinary (tract) infections: Secondary | ICD-10-CM

## 2014-11-14 DIAGNOSIS — Z6839 Body mass index (BMI) 39.0-39.9, adult: Secondary | ICD-10-CM

## 2014-11-14 DIAGNOSIS — R279 Unspecified lack of coordination: Secondary | ICD-10-CM | POA: Diagnosis not present

## 2014-11-14 DIAGNOSIS — F0391 Unspecified dementia with behavioral disturbance: Secondary | ICD-10-CM | POA: Diagnosis not present

## 2014-11-14 DIAGNOSIS — R410 Disorientation, unspecified: Secondary | ICD-10-CM

## 2014-11-14 DIAGNOSIS — I6350 Cerebral infarction due to unspecified occlusion or stenosis of unspecified cerebral artery: Secondary | ICD-10-CM

## 2014-11-14 DIAGNOSIS — I5032 Chronic diastolic (congestive) heart failure: Secondary | ICD-10-CM | POA: Diagnosis present

## 2014-11-14 DIAGNOSIS — R4182 Altered mental status, unspecified: Secondary | ICD-10-CM | POA: Diagnosis not present

## 2014-11-14 DIAGNOSIS — G8929 Other chronic pain: Secondary | ICD-10-CM | POA: Diagnosis not present

## 2014-11-14 DIAGNOSIS — I63521 Cerebral infarction due to unspecified occlusion or stenosis of right anterior cerebral artery: Principal | ICD-10-CM | POA: Diagnosis present

## 2014-11-14 DIAGNOSIS — D509 Iron deficiency anemia, unspecified: Secondary | ICD-10-CM | POA: Diagnosis not present

## 2014-11-14 DIAGNOSIS — N179 Acute kidney failure, unspecified: Secondary | ICD-10-CM | POA: Diagnosis present

## 2014-11-14 DIAGNOSIS — Z8673 Personal history of transient ischemic attack (TIA), and cerebral infarction without residual deficits: Secondary | ICD-10-CM | POA: Diagnosis not present

## 2014-11-14 DIAGNOSIS — I10 Essential (primary) hypertension: Secondary | ICD-10-CM | POA: Diagnosis present

## 2014-11-14 DIAGNOSIS — I6330 Cerebral infarction due to thrombosis of unspecified cerebral artery: Secondary | ICD-10-CM | POA: Diagnosis not present

## 2014-11-14 DIAGNOSIS — N3281 Overactive bladder: Secondary | ICD-10-CM | POA: Diagnosis not present

## 2014-11-14 DIAGNOSIS — Z833 Family history of diabetes mellitus: Secondary | ICD-10-CM | POA: Diagnosis not present

## 2014-11-14 DIAGNOSIS — Z7902 Long term (current) use of antithrombotics/antiplatelets: Secondary | ICD-10-CM | POA: Diagnosis not present

## 2014-11-14 DIAGNOSIS — N390 Urinary tract infection, site not specified: Secondary | ICD-10-CM | POA: Diagnosis not present

## 2014-11-14 DIAGNOSIS — E669 Obesity, unspecified: Secondary | ICD-10-CM | POA: Diagnosis present

## 2014-11-14 DIAGNOSIS — F410 Panic disorder [episodic paroxysmal anxiety] without agoraphobia: Secondary | ICD-10-CM | POA: Diagnosis not present

## 2014-11-14 DIAGNOSIS — E119 Type 2 diabetes mellitus without complications: Secondary | ICD-10-CM | POA: Diagnosis not present

## 2014-11-14 DIAGNOSIS — K5900 Constipation, unspecified: Secondary | ICD-10-CM | POA: Diagnosis not present

## 2014-11-14 DIAGNOSIS — E568 Deficiency of other vitamins: Secondary | ICD-10-CM | POA: Diagnosis not present

## 2014-11-14 DIAGNOSIS — R531 Weakness: Secondary | ICD-10-CM | POA: Diagnosis present

## 2014-11-14 DIAGNOSIS — I6789 Other cerebrovascular disease: Secondary | ICD-10-CM | POA: Diagnosis not present

## 2014-11-14 DIAGNOSIS — I252 Old myocardial infarction: Secondary | ICD-10-CM | POA: Diagnosis not present

## 2014-11-14 DIAGNOSIS — I129 Hypertensive chronic kidney disease with stage 1 through stage 4 chronic kidney disease, or unspecified chronic kidney disease: Secondary | ICD-10-CM | POA: Diagnosis present

## 2014-11-14 DIAGNOSIS — R079 Chest pain, unspecified: Secondary | ICD-10-CM | POA: Diagnosis not present

## 2014-11-14 HISTORY — DX: Cerebral infarction, unspecified: I63.9

## 2014-11-14 LAB — COMPREHENSIVE METABOLIC PANEL
ALT: 28 U/L (ref 0–35)
AST: 45 U/L — ABNORMAL HIGH (ref 0–37)
Albumin: 3.4 g/dL — ABNORMAL LOW (ref 3.5–5.2)
Alkaline Phosphatase: 43 U/L (ref 39–117)
Anion gap: 13 (ref 5–15)
BUN: 59 mg/dL — ABNORMAL HIGH (ref 6–23)
CALCIUM: 9.6 mg/dL (ref 8.4–10.5)
CO2: 22 mmol/L (ref 19–32)
CREATININE: 2.74 mg/dL — AB (ref 0.50–1.10)
Chloride: 105 mmol/L (ref 96–112)
GFR calc Af Amer: 18 mL/min — ABNORMAL LOW (ref >90)
GFR calc non Af Amer: 16 mL/min — ABNORMAL LOW (ref >90)
Glucose, Bld: 185 mg/dL — ABNORMAL HIGH (ref 70–99)
Potassium: 4.5 mmol/L (ref 3.5–5.1)
SODIUM: 140 mmol/L (ref 135–145)
TOTAL PROTEIN: 6.2 g/dL (ref 6.0–8.3)
Total Bilirubin: 0.6 mg/dL (ref 0.3–1.2)

## 2014-11-14 LAB — RAPID URINE DRUG SCREEN, HOSP PERFORMED
Amphetamines: NOT DETECTED
BARBITURATES: NOT DETECTED
Benzodiazepines: NOT DETECTED
Cocaine: NOT DETECTED
Opiates: NOT DETECTED
Tetrahydrocannabinol: NOT DETECTED

## 2014-11-14 LAB — I-STAT CHEM 8, ED
BUN: 58 mg/dL — ABNORMAL HIGH (ref 6–23)
CREATININE: 2.8 mg/dL — AB (ref 0.50–1.10)
Calcium, Ion: 1.27 mmol/L (ref 1.13–1.30)
Chloride: 108 mmol/L (ref 96–112)
Glucose, Bld: 184 mg/dL — ABNORMAL HIGH (ref 70–99)
HEMATOCRIT: 39 % (ref 36.0–46.0)
HEMOGLOBIN: 13.3 g/dL (ref 12.0–15.0)
POTASSIUM: 4.4 mmol/L (ref 3.5–5.1)
SODIUM: 139 mmol/L (ref 135–145)
TCO2: 20 mmol/L (ref 0–100)

## 2014-11-14 LAB — URINALYSIS, ROUTINE W REFLEX MICROSCOPIC
Bilirubin Urine: NEGATIVE
Glucose, UA: NEGATIVE mg/dL
KETONES UR: NEGATIVE mg/dL
NITRITE: NEGATIVE
Protein, ur: NEGATIVE mg/dL
Specific Gravity, Urine: 1.015 (ref 1.005–1.030)
Urobilinogen, UA: 0.2 mg/dL (ref 0.0–1.0)
pH: 5 (ref 5.0–8.0)

## 2014-11-14 LAB — CBC
HCT: 38.6 % (ref 36.0–46.0)
Hemoglobin: 12.9 g/dL (ref 12.0–15.0)
MCH: 29.7 pg (ref 26.0–34.0)
MCHC: 33.4 g/dL (ref 30.0–36.0)
MCV: 88.9 fL (ref 78.0–100.0)
Platelets: 187 10*3/uL (ref 150–400)
RBC: 4.34 MIL/uL (ref 3.87–5.11)
RDW: 13.2 % (ref 11.5–15.5)
WBC: 8.4 10*3/uL (ref 4.0–10.5)

## 2014-11-14 LAB — DIFFERENTIAL
BASOS ABS: 0 10*3/uL (ref 0.0–0.1)
BASOS PCT: 0 % (ref 0–1)
EOS PCT: 3 % (ref 0–5)
Eosinophils Absolute: 0.2 10*3/uL (ref 0.0–0.7)
LYMPHS ABS: 1.3 10*3/uL (ref 0.7–4.0)
Lymphocytes Relative: 16 % (ref 12–46)
Monocytes Absolute: 0.6 10*3/uL (ref 0.1–1.0)
Monocytes Relative: 7 % (ref 3–12)
NEUTROS ABS: 6.3 10*3/uL (ref 1.7–7.7)
Neutrophils Relative %: 74 % (ref 43–77)

## 2014-11-14 LAB — URINE MICROSCOPIC-ADD ON

## 2014-11-14 LAB — GLUCOSE, CAPILLARY
GLUCOSE-CAPILLARY: 100 mg/dL — AB (ref 70–99)
Glucose-Capillary: 233 mg/dL — ABNORMAL HIGH (ref 70–99)

## 2014-11-14 LAB — ETHANOL: Alcohol, Ethyl (B): <5 mg/dL (ref 0–9)

## 2014-11-14 LAB — I-STAT TROPONIN, ED: Troponin i, poc: 0.01 ng/mL (ref 0.00–0.08)

## 2014-11-14 LAB — CBG MONITORING, ED: Glucose-Capillary: 186 mg/dL — ABNORMAL HIGH (ref 70–99)

## 2014-11-14 LAB — APTT: aPTT: 27 seconds (ref 24–37)

## 2014-11-14 LAB — PROTIME-INR
INR: 1.04 (ref 0.00–1.49)
PROTHROMBIN TIME: 13.7 s (ref 11.6–15.2)

## 2014-11-14 MED ORDER — METOPROLOL TARTRATE 100 MG PO TABS: 100.0000 mg | ORAL_TABLET | Freq: Two times a day (BID) | ORAL | Status: DC

## 2014-11-14 MED ORDER — PHENAZOPYRIDINE HCL 100 MG PO TABS
100.0000 mg | ORAL_TABLET | Freq: Three times a day (TID) | ORAL | Status: DC
  Filled 2014-11-14 (×2): qty 1

## 2014-11-14 MED ORDER — ATORVASTATIN CALCIUM 80 MG PO TABS
80.0000 mg | ORAL_TABLET | Freq: Every day | ORAL | Status: DC
Start: 2014-11-14 — End: 2014-11-18
  Administered 2014-11-15 – 2014-11-17 (×3): 80 mg via ORAL
  Filled 2014-11-14 (×3): qty 1

## 2014-11-14 MED ORDER — PHENAZOPYRIDINE HCL 100 MG PO TABS
100.0000 mg | ORAL_TABLET | Freq: Three times a day (TID) | ORAL | Status: DC | PRN
  Filled 2014-11-14: qty 1

## 2014-11-14 MED ORDER — ASPIRIN 300 MG RE SUPP
300.0000 mg | Freq: Once | RECTAL | Status: AC
  Administered 2014-11-14: 300 mg via RECTAL
  Filled 2014-11-14: qty 1

## 2014-11-14 MED ORDER — STROKE: EARLY STAGES OF RECOVERY BOOK
Freq: Once | Status: AC
  Administered 2014-11-14: 21:00:00

## 2014-11-14 MED ORDER — POLYETHYLENE GLYCOL 3350 17 G PO PACK: 17.0000 g | PACK | Freq: Every day | ORAL | Status: DC | PRN

## 2014-11-14 MED ORDER — HYDRALAZINE HCL 25 MG PO TABS
25.0000 mg | ORAL_TABLET | Freq: Three times a day (TID) | ORAL | Status: DC
Start: 2014-11-14 — End: 2014-11-15

## 2014-11-14 MED ORDER — INSULIN ASPART 100 UNIT/ML ~~LOC~~ SOLN: 0.0000 [IU] | SUBCUTANEOUS | Status: DC

## 2014-11-14 MED ORDER — OMEGA-3 FATTY ACIDS 1000 MG PO CAPS
1.0000 g | ORAL_CAPSULE | Freq: Every day | ORAL | Status: DC
  Administered 2014-11-15 – 2014-11-17 (×3): 1 g via ORAL
  Filled 2014-11-14 (×7): qty 1

## 2014-11-14 MED ORDER — CALCIUM CARBONATE-VITAMIN D 500-200 MG-UNIT PO TABS
1.0000 | ORAL_TABLET | Freq: Every day | ORAL | Status: DC
  Administered 2014-11-15 – 2014-11-18 (×4): 1 via ORAL
  Filled 2014-11-14 (×8): qty 1

## 2014-11-14 MED ORDER — FERROUS SULFATE 325 (65 FE) MG PO TABS
325.0000 mg | ORAL_TABLET | Freq: Every day | ORAL | Status: DC
  Administered 2014-11-15 – 2014-11-18 (×4): 325 mg via ORAL
  Filled 2014-11-14 (×4): qty 1

## 2014-11-14 MED ORDER — BUPROPION HCL ER (SR) 100 MG PO TB12
100.0000 mg | ORAL_TABLET | Freq: Every day | ORAL | Status: DC
  Administered 2014-11-15 – 2014-11-18 (×4): 100 mg via ORAL
  Filled 2014-11-14 (×5): qty 1

## 2014-11-14 MED ORDER — SODIUM CHLORIDE 0.9 % IV SOLN
INTRAVENOUS | Status: AC
  Administered 2014-11-14: 12:00:00 via INTRAVENOUS

## 2014-11-14 MED ORDER — SENNOSIDES-DOCUSATE SODIUM 8.6-50 MG PO TABS
1.0000 | ORAL_TABLET | Freq: Two times a day (BID) | ORAL | Status: DC
  Administered 2014-11-14 – 2014-11-17 (×7): 1 via ORAL
  Filled 2014-11-14 (×8): qty 1

## 2014-11-14 MED ORDER — HEPARIN SODIUM (PORCINE) 5000 UNIT/ML IJ SOLN
5000.0000 [IU] | Freq: Three times a day (TID) | INTRAMUSCULAR | Status: DC
  Administered 2014-11-14 – 2014-11-18 (×11): 5000 [IU] via SUBCUTANEOUS
  Filled 2014-11-14 (×11): qty 1

## 2014-11-14 MED ORDER — SODIUM CHLORIDE 0.9 % IV SOLN
INTRAVENOUS | Status: DC
  Administered 2014-11-14: 13:00:00 via INTRAVENOUS

## 2014-11-14 MED ORDER — FENOFIBRATE 54 MG PO TABS
54.0000 mg | ORAL_TABLET | Freq: Every day | ORAL | Status: DC
  Administered 2014-11-15 – 2014-11-18 (×4): 54 mg via ORAL
  Filled 2014-11-14 (×5): qty 1

## 2014-11-14 MED ORDER — DICLOFENAC SODIUM 1 % TD GEL
4.0000 g | Freq: Two times a day (BID) | TRANSDERMAL | Status: DC | PRN
  Filled 2014-11-14: qty 100

## 2014-11-14 MED ORDER — NITROGLYCERIN 0.4 MG SL SUBL: 0.4000 mg | SUBLINGUAL_TABLET | SUBLINGUAL | Status: DC | PRN

## 2014-11-14 MED ORDER — FESOTERODINE FUMARATE ER 4 MG PO TB24
4.0000 mg | ORAL_TABLET | Freq: Every day | ORAL | Status: DC
  Administered 2014-11-15: 4 mg via ORAL
  Filled 2014-11-14 (×2): qty 1

## 2014-11-14 MED ORDER — INSULIN ASPART 100 UNIT/ML ~~LOC~~ SOLN
0.0000 [IU] | SUBCUTANEOUS | Status: DC
  Administered 2014-11-14 – 2014-11-15 (×2): 5 [IU] via SUBCUTANEOUS
  Administered 2014-11-15: 3 [IU] via SUBCUTANEOUS
  Administered 2014-11-15: 5 [IU] via SUBCUTANEOUS
  Administered 2014-11-15: 3 [IU] via SUBCUTANEOUS
  Administered 2014-11-15: 5 [IU] via SUBCUTANEOUS
  Administered 2014-11-16 (×3): 3 [IU] via SUBCUTANEOUS

## 2014-11-14 MED ORDER — CLOPIDOGREL BISULFATE 75 MG PO TABS
75.0000 mg | ORAL_TABLET | Freq: Every day | ORAL | Status: DC
  Administered 2014-11-15 – 2014-11-18 (×4): 75 mg via ORAL
  Filled 2014-11-14 (×4): qty 1

## 2014-11-14 NOTE — ED Notes (Addendum)
Pt brought to Ed from home via EMS for evaluation of left sided weakness. Pt was last seen normal this morning by family at 0630 when she ambulated to restroom with walker. At 10 am family was at home to bring pt breakfast and pt had gone to restroom again and she was unable to get herself up from toilet which is abnormal for her. Family noticed she was weak to the left side. Pt has hx of two strokes in the past. Per family, pt experienced a fall yesterday around noon. Pt is on blood thinners. Family also states it appeared pt had taken all of her medications for today and tonight, this morning. Pt vitals - hr 52 and normal, bp 114/50, cbg 186. Pt has a hx of HTN and diabetes as well. Pt is drowsy upon arrival, alert and oriented to self and situation, but disoriented to time pt believes she is 80 years old and that it is the month of June. EDP at bridge, airway cleared. Phleb drew labs at this time. Headed to CT 1. Pt monitored on zoll lying flat.

## 2014-11-14 NOTE — H&P (Addendum)
[facility] History and Physical  Abigail Brooks ZOX:[id number] DOB: Jan 01, 1935 DOA: 11/14/2014  Referring physician: Dr Reather Converse PCP: Lauree Chandler, NP   Chief Complaint:  Acute left sided weakness x day  HPI:  80 year old female with history of diastolic CHF, type 2 diabetes mellitus with diabetic nephropathy, chronic kidney disease stage III with baseline creatinine of 1.3, TIA with left-sided weakness in July 2015, history of CAD status post stents, lumbar stenosis with neurogenic claudications, was brought to the hospital by family after sustaining acute onset left-sided weakness. Patient was in the bathroom this morning when she called out for help and could not get up. She also was found to be more sleepy and confused. No witnessed fall or loss of consciousness.  He was last seen normal at her on 6:30 in the morning. EMS noted left arm drift with left arm and leg weakness worse from her baseline. Patient could not provide much history in the ED as she was drowsy. Bedside swallow evaluation also was not checked. Neuro Hospital this was consulted as a Code stroke but patient was out of therapeutic window for TPA. As per daughter patient did not complain of any nausea, vomiting, dizziness, chest pain, palpitations, shortness of breath, headache, blurred vision, abdominal pain, bowel or urinary symptoms. Denies any tingling or numbness of her extremities. In the ED patient's vitals were stable. Blood will done showed normal CBC, chemistry showed sodium of 140, K of 4.5, chloride 105, CO2 22, worsened renal function with BUN of 59 and creatinine of 2.74 (baseline creatinine of 1.3) blood glucose is 185. Family denies episodes of hypoglycemia. At baseline patient infrequently uses a walker to ambulate, lives alone and is independent of most of her ADLs.  Head CT done in the ED showed acute to subacute right frontal lobe infarct in the anterior cerebral artery distribution without any  hemorrhage. Neuro hospitalist evaluated the patient and recommended full stroke workup. Hospitalist admission requested.  Review of Systems:  As outlined in history of present illness. Full review of systems not obtained due to patient's drowsiness.   Past Medical History  Diagnosis Date  . Coronary artery disease     stents x 2, sees Dr. Cecille Aver practice  . Congestive heart failure     2012  . Myocardial infarction     "unsure of when"  . Hypertension   . Diabetes mellitus   . Shortness of breath   . Anemia   . Blood transfusion     when had hysterectomy  . Hypothyroidism     has had hx of treatment  . Urinary frequency   . Urinary tract infection     hx of  . GERD (gastroesophageal reflux disease)   . H/O hiatal hernia   . Arthritis   . Depression   . Spinal stenosis     with bone spurs and ruptured disc  . Cataract   . Blood transfusion without reported diagnosis   . Complications affecting other specified body systems, hypertension   . Other malaise and fatigue   . Dysuria   . Unspecified vitamin D deficiency   . Anxiety state, unspecified   . Other B-complex deficiencies   . Abnormality of gait   . Other and unspecified hyperlipidemia   . Stroke    Past Surgical History  Procedure Laterality Date  . Cataract extraction w/ intraocular lens  implant, bilateral    . Bladder suspension    . Coronary  stents x 2, stent implant placed9/21/06  . Back surgery      ruptured disc, lumbar area  . Appendectomy    . Cardiac catheterization    . Lumbar laminectomy/decompression microdiscectomy  12/21/2011    Procedure: LUMBAR LAMINECTOMY/DECOMPRESSION MICRODISCECTOMY 1 LEVEL;  Surgeon: Charlie Pitter, MD;  Location: [facility];  Service: Neurosurgery;  Laterality: Bilateral;  Lumbar four-five decompressive lumbar laminectomy; Right Lumbar four-five microdiscectomy  . Eye surgery    . Spine surgery    . Abdominal hysterectomy  1986   Social History:  reports  that she has quit smoking. Her smoking use included Cigarettes. Her smokeless tobacco use includes Snuff. She reports that she does not drink alcohol or use illicit drugs.  Allergies  Allergen Reactions  . Morphine And Related Other (See Comments)    Makes her crazy  . Enalapril Swelling    Facial swelling    Family History  Problem Relation Age of Onset  . Anesthesia problems Sister   . Anesthesia problems Son   . Thyroid cancer Son   . Heart disease Mother   . Diabetes Sister     Prior to Admission medications   Medication Sig Start Date End Date Taking? Authorizing Provider  atorvastatin (LIPITOR) 10 MG tablet Take 1 tablet (10 mg total) by mouth daily at 6 PM. 08/10/14   Blanchie Serve, MD  buPROPion (WELLBUTRIN SR) 100 MG 12 hr tablet Take 1 tablet (100 mg total) by mouth daily. 08/26/14   Garvin Fila, MD  Calcium Carbonate-Vitamin D (CALCIUM + D PO) Take 1 tablet by mouth daily. Calcium 315 mg,Vitamin D3 250 I.U.    Historical Provider, MD  cephALEXin (KEFLEX) 250 MG capsule Take 250 mg by mouth daily.    Historical Provider, MD  clonazePAM (KLONOPIN) 0.5 MG tablet Take one tablet by mouth twice daily for nerves 11/11/14   Lauree Chandler, NP  clopidogrel (PLAVIX) 75 MG tablet Take 1 tablet (75 mg total) by mouth daily. 08/10/14   Blanchie Serve, MD  diclofenac sodium (VOLTAREN) 1 % GEL Apply 4 g topically 2 (two) times daily as needed (for knee pain).    Historical Provider, MD  fenofibrate 54 MG tablet Take 1 tablet (54 mg total) by mouth daily. 08/10/14   Blanchie Serve, MD  ferrous sulfate 325 (65 FE) MG tablet Take 1 tablet (325 mg total) by mouth daily with breakfast. 08/10/14   Blanchie Serve, MD  fesoterodine (TOVIAZ) 4 MG TB24 tablet Take 1 tablet (4 mg total) by mouth daily. 08/10/14   Blanchie Serve, MD  fish oil-omega-3 fatty acids 1000 MG capsule Take 1 g by mouth daily.     Historical Provider, MD  hydrALAZINE (APRESOLINE) 25 MG tablet Take 1 tablet (25 mg total) by  mouth 3 (three) times daily. 08/10/14   Blanchie Serve, MD  ibuprofen (ADVIL,MOTRIN) 400 MG tablet Take 1 tablet (400 mg total) by mouth every 6 (six) hours as needed. 11/09/14   Okey Regal, PA-C  losartan-hydrochlorothiazide (HYZAAR) 50-12.5 MG per tablet Take 1 tablet by mouth daily. 08/10/14   Blanchie Serve, MD  metFORMIN (GLUCOPHAGE) 500 MG tablet Take 1 tablet (500 mg total) by mouth daily with breakfast. 08/10/14   Blanchie Serve, MD  metoprolol (LOPRESSOR) 100 MG tablet TAKE 1 TABLET (100 MG TOTAL) BY MOUTH 2 (TWO) TIMES DAILY. 08/10/14   Blanchie Serve, MD  MOVIPREP 100 G SOLR Take 1 kit (200 g total) by mouth once. Patient not taking: Reported on 11/09/2014 07/12/14  Laban Emperor Zehr, PA-C  nitroGLYCERIN (NITROSTAT) 0.4 MG SL tablet Place 0.4 mg under the tongue every 5 (five) minutes as needed for chest pain. Max 3 tablets in 15 minutes    Historical Provider, MD  nystatin-triamcinolone ointment (MYCOLOG) Apply 1 application topically 2 (two) times daily. 08/10/14   Blanchie Serve, MD  oxyCODONE-acetaminophen (PERCOCET/ROXICET) 5-325 MG per tablet Take one to two tablets by mouth every 8 hours as needed for severe pain 10/26/14   Estill Dooms, MD  phenazopyridine (PYRIDIUM) 100 MG tablet Take 1 tablet (100 mg total) by mouth 3 (three) times daily as needed for pain. 04/13/14   Blanchie Serve, MD  polyethylene glycol (MIRALAX / GLYCOLAX) packet Take 17 g by mouth daily as needed (pain).    Historical Provider, MD  QUEtiapine (SEROQUEL) 25 MG tablet 1 by mouth in the morning and 2 by mouth in the evening Patient taking differently: Take 25-50 mg by mouth 2 (two) times daily. 1 by mouth in the morning and 2 by mouth in the evening 08/10/14   Blanchie Serve, MD  senna-docusate (SENNA S) 8.6-50 MG per tablet Take 1 tablet by mouth 2 (two) times daily.    Historical Provider, MD     Physical Exam:  Filed Vitals:   11/14/14 1200 11/14/14 1215 11/14/14 1230 11/14/14 1231  BP: 109/45 117/57 114/57    Pulse: 51 52 51   Temp:    97.4 F (36.3 C)  TempSrc:      Resp: _0 SpO2: 100% 100% 100%     Constitutional: Vital signs reviewed.  Elderly female lying in bed sleepy but arousable and answering questions HEENT: no pallor, no icterus, moist oral mucosa, no cervical lymphadenopathy Cardiovascular: S1 and S2 bradycardic, 3/6 studies murmur, no rubs or gallop Chest: CTAB, no wheezes, rales, or rhonchi Abdominal: Soft. Non-tender, non-distended, bowel sounds are normal,  Ext: warm, no edema Neurological: sleepy but arousable, oriented x 2-3, plantars equivocal bilaterally, normal reflex, 4/5 power in LLE, Cerebellar  function and gait not assessed  Labs on Admission:  Basic Metabolic Panel:  Recent Labs Lab 11/14/14 1053 11/14/14 1100  NA 140 139  K 4.5 4.4  CL 105 108  CO2 22  --   GLUCOSE 185* 184*  BUN 59* 58*  CREATININE 2.74* 2.80*  CALCIUM 9.6  --    Liver Function Tests:  Recent Labs Lab 11/14/14 1053  AST 45*  ALT 28  ALKPHOS 43  BILITOT 0.6  PROT 6.2  ALBUMIN 3.4*   No results for input(s): LIPASE, AMYLASE in the last 168 hours. No results for input(s): AMMONIA in the last 168 hours. CBC:  Recent Labs Lab 11/14/14 1053 11/14/14 1100  WBC 8.4  --   NEUTROABS 6.3  --   HGB 12.9 13.3  HCT 38.6 39.0  MCV 88.9  --   PLT 187  --    Cardiac Enzymes: No results for input(s): CKTOTAL, CKMB, CKMBINDEX, TROPONINI in the last 168 hours. BNP: Invalid input(s): POCBNP CBG:  Recent Labs Lab 11/14/14 1050  GLUCAP 186*    Radiological Exams on Admission: Ct Head Wo Contrast  11/14/2014   CLINICAL DATA:  Patient with sudden onset of left upper extremity weakness. Recent fall. History of prior TIAs.  EXAM: CT HEAD WITHOUT CONTRAST  TECHNIQUE: Contiguous axial images were obtained from the base of the skull through the vertex without intravenous contrast.  COMPARISON:  Brain CT 08/21/2014  FINDINGS: Medial right frontal lobe hypodensity and  loss of  gray-white differentiation. No significant mass effect. No evidence for intracranial hemorrhage. Bilateral cataract surgery. Paranasal sinuses are unremarkable. Mastoid air cells are unremarkable. Calvarium is intact.  IMPRESSION: Findings compatible with acute to subacute right frontal lobe infarct in the anterior cerebral artery distribution. No hemorrhage.  Critical Value/emergent results were called by telephone at the time of interpretation on 11/14/2014 at 11:07 am to Dr. Nicole Kindred, who verbally acknowledged these results.   Electronically Signed   By: Lovey Newcomer M.D.   On: 11/14/2014 11:10    EKG: As bradycardia at 53, no ST-T changes  Assessment/Plan Principal Problem:   CVA (cerebral vascular accident)   Active Problems:   Essential hypertension, benign   Dementia with behavioral disturbance   Left-sided weakness   Type 2 diabetes mellitus with renal manifestations, controlled   CKD (chronic kidney disease) stage 3, GFR 30-59 ml/min   Acute ischemic stroke   Acute renal failure superimposed on stage 3 chronic kidney disease     Acute CVA  Admit to telemetry  neuro checks q4 hr Head CT on admission shows acute right frontal lobe infarct. Ordered MRI brain, MRA head. -Check 2D echo, carotid doppler, A1C and lipid panel. Allow permissive HTN. -swallow eval, PT, OT. -received full dose aspirin in the ED. Will place her on Lipitor 80 mg daily. -Continue Plavix per neurology. -Appreciate neurology recommendations.   Chronic diastolic CHF Appears euvolemic at this time. Continue home dose metoprolol. Will hold lisinopril/HCTZ given AKI.      type 2 Diabetes mellitus with diabetic nephropathy hold metformin. Check A1C, monitor fsg. continue SSI  Acute on chronic kidney disease stage III Place on gentle hydration. Hold HCTZ-lisinopril. Check UA.  Dementia with behavioral disturbance Mild at baseline. Patient lethargic but answering questions  appropriately.  Diet:diabetic  DVT prophylaxis: sq heparin   Code Status: full code Family Communication: discussed with daughter at bedside Disposition Plan: admit to telemetry  [place], [facility] Pager [phone]  Total time spent on admission 60 minutes  If 7PM-7AM, please contact night-coverage [email or web address] Password [facility] 11/14/2014, 12:49 PM

## 2014-11-14 NOTE — ED Provider Notes (Signed)
CSN: [id number]     Arrival date & time 11/14/14  1048 History   First MD Initiated Contact with Patient 11/14/14 1110     Chief Complaint  Patient presents with  . Code Stroke     (Consider location/radiation/quality/duration/timing/severity/associated sxs/prior Treatment) HPI Comments: 80 year old female with history of high blood pressure, CAD, heart failure, memory loss, urine infection, dementia presents with left-sided weakness and confusion that started around 6:30 AM last seen normal. Family found patient unable to get herself up from the toilet around 10 AM is abnormal for her. EMS noted left arm drift and left leg and arm weakness worse in the arm. This is new for patient however she has had strokes in the past. Unknown blood thinners. Patient is mildly improved with time.  Difficulty obtaining history from patient due to confusion/drowsy presentation.  The history is provided by the patient, medical records and the EMS personnel.    Past Medical History  Diagnosis Date  . Coronary artery disease     stents x 2, sees Dr. Cecille Aver practice  . Congestive heart failure     2012  . Myocardial infarction     "unsure of when"  . Hypertension   . Diabetes mellitus   . Shortness of breath   . Anemia   . Blood transfusion     when had hysterectomy  . Hypothyroidism     has had hx of treatment  . Urinary frequency   . Urinary tract infection     hx of  . GERD (gastroesophageal reflux disease)   . H/O hiatal hernia   . Arthritis   . Depression   . Spinal stenosis     with bone spurs and ruptured disc  . Cataract   . Blood transfusion without reported diagnosis   . Complications affecting other specified body systems, hypertension   . Other malaise and fatigue   . Dysuria   . Unspecified vitamin D deficiency   . Anxiety state, unspecified   . Other B-complex deficiencies   . Abnormality of gait   . Other and unspecified hyperlipidemia   . Stroke    Past Surgical  History  Procedure Laterality Date  . Cataract extraction w/ intraocular lens  implant, bilateral    . Bladder suspension    . Coronary      stents x 2, stent implant placed9/21/06  . Back surgery      ruptured disc, lumbar area  . Appendectomy    . Cardiac catheterization    . Lumbar laminectomy/decompression microdiscectomy  12/21/2011    Procedure: LUMBAR LAMINECTOMY/DECOMPRESSION MICRODISCECTOMY 1 LEVEL;  Surgeon: Charlie Pitter, MD;  Location: [facility];  Service: Neurosurgery;  Laterality: Bilateral;  Lumbar four-five decompressive lumbar laminectomy; Right Lumbar four-five microdiscectomy  . Eye surgery    . Spine surgery    . Abdominal hysterectomy  1986   Family History  Problem Relation Age of Onset  . Anesthesia problems Sister   . Anesthesia problems Son   . Thyroid cancer Son   . Heart disease Mother   . Diabetes Sister    History  Substance Use Topics  . Smoking status: Former Smoker    Types: Cigarettes  . Smokeless tobacco: Current User    Types: Snuff     Comment: 40 years ago  . Alcohol Use: No   OB History    No data available     Review of Systems  Unable to perform ROS: Mental status change  Allergies  Morphine and related and Enalapril  Home Medications   Prior to Admission medications   Medication Sig Start Date End Date Taking? Authorizing Provider  atorvastatin (LIPITOR) 10 MG tablet Take 1 tablet (10 mg total) by mouth daily at 6 PM. 08/10/14   Blanchie Serve, MD  buPROPion (WELLBUTRIN SR) 100 MG 12 hr tablet Take 1 tablet (100 mg total) by mouth daily. 08/26/14   Garvin Fila, MD  Calcium Carbonate-Vitamin D (CALCIUM + D PO) Take 1 tablet by mouth daily. Calcium 315 mg,Vitamin D3 250 I.U.    Historical Provider, MD  cephALEXin (KEFLEX) 250 MG capsule Take 250 mg by mouth daily.    Historical Provider, MD  clonazePAM (KLONOPIN) 0.5 MG tablet Take one tablet by mouth twice daily for nerves 11/11/14   Lauree Chandler, NP  clopidogrel  (PLAVIX) 75 MG tablet Take 1 tablet (75 mg total) by mouth daily. 08/10/14   Blanchie Serve, MD  diclofenac sodium (VOLTAREN) 1 % GEL Apply 4 g topically 2 (two) times daily as needed (for knee pain).    Historical Provider, MD  fenofibrate 54 MG tablet Take 1 tablet (54 mg total) by mouth daily. 08/10/14   Blanchie Serve, MD  ferrous sulfate 325 (65 FE) MG tablet Take 1 tablet (325 mg total) by mouth daily with breakfast. 08/10/14   Blanchie Serve, MD  fesoterodine (TOVIAZ) 4 MG TB24 tablet Take 1 tablet (4 mg total) by mouth daily. 08/10/14   Blanchie Serve, MD  fish oil-omega-3 fatty acids 1000 MG capsule Take 1 g by mouth daily.     Historical Provider, MD  hydrALAZINE (APRESOLINE) 25 MG tablet Take 1 tablet (25 mg total) by mouth 3 (three) times daily. 08/10/14   Blanchie Serve, MD  ibuprofen (ADVIL,MOTRIN) 400 MG tablet Take 1 tablet (400 mg total) by mouth every 6 (six) hours as needed. 11/09/14   Okey Regal, PA-C  losartan-hydrochlorothiazide (HYZAAR) 50-12.5 MG per tablet Take 1 tablet by mouth daily. 08/10/14   Blanchie Serve, MD  metFORMIN (GLUCOPHAGE) 500 MG tablet Take 1 tablet (500 mg total) by mouth daily with breakfast. 08/10/14   Blanchie Serve, MD  metoprolol (LOPRESSOR) 100 MG tablet TAKE 1 TABLET (100 MG TOTAL) BY MOUTH 2 (TWO) TIMES DAILY. 08/10/14   Blanchie Serve, MD  MOVIPREP 100 G SOLR Take 1 kit (200 g total) by mouth once. Patient not taking: Reported on 11/09/2014 07/12/14   Laban Emperor Zehr, PA-C  nitroGLYCERIN (NITROSTAT) 0.4 MG SL tablet Place 0.4 mg under the tongue every 5 (five) minutes as needed for chest pain. Max 3 tablets in 15 minutes    Historical Provider, MD  nystatin-triamcinolone ointment (MYCOLOG) Apply 1 application topically 2 (two) times daily. 08/10/14   Blanchie Serve, MD  oxyCODONE-acetaminophen (PERCOCET/ROXICET) 5-325 MG per tablet Take one to two tablets by mouth every 8 hours as needed for severe pain 10/26/14   Estill Dooms, MD  phenazopyridine  (PYRIDIUM) 100 MG tablet Take 1 tablet (100 mg total) by mouth 3 (three) times daily as needed for pain. 04/13/14   Blanchie Serve, MD  polyethylene glycol (MIRALAX / GLYCOLAX) packet Take 17 g by mouth daily as needed (pain).    Historical Provider, MD  QUEtiapine (SEROQUEL) 25 MG tablet 1 by mouth in the morning and 2 by mouth in the evening Patient taking differently: Take 25-50 mg by mouth 2 (two) times daily. 1 by mouth in the morning and 2 by mouth in the evening 08/10/14  Blanchie Serve, MD  senna-docusate (SENNA S) 8.6-50 MG per tablet Take 1 tablet by mouth 2 (two) times daily.    Historical Provider, MD   BP 124/53 mmHg  Pulse 51  Temp(Src) 97.3 F (36.3 C) (Oral)  Resp 15  SpO2 100% Physical Exam  Constitutional: She appears well-developed and well-nourished.  HENT:  Head: Normocephalic and atraumatic.  Mild dry mucous membranes  Eyes: Right eye exhibits no discharge. Left eye exhibits no discharge.  Neck: Normal range of motion. Neck supple. No tracheal deviation present.  Cardiovascular: Regular rhythm.  Bradycardia present.   Pulmonary/Chest: Effort normal.  Abdominal: Soft. She exhibits no distension. There is no tenderness. There is no guarding.  Musculoskeletal: She exhibits no edema.  Neurological: She is alert. GCS eye subscore is 4. GCS verbal subscore is 4. GCS motor subscore is 6.  Patient can barely left her left arm against gravity for a few seconds. Patient has slight left leg weakness, general confusion and fatigue appearance. Gross sensation intact. Pupils equal bilateral.  Skin: Skin is warm. No rash noted.  Psychiatric: She has a normal mood and affect.  Nursing note and vitals reviewed.   ED Course  Procedures (including critical care time) CRITICAL CARE Performed by: Mariea Clonts   Total critical care time: 35 min  Critical care time was exclusive of separately billable procedures and treating other patients.  Critical care was necessary to  treat or prevent imminent or life-threatening deterioration.  Critical care was time spent personally by me on the following activities: development of treatment plan with patient and/or surrogate as well as nursing, discussions with consultants, evaluation of patient's response to treatment, examination of patient, obtaining history from patient or surrogate, ordering and performing treatments and interventions, ordering and review of laboratory studies, ordering and review of radiographic studies, pulse oximetry and re-evaluation of patient's condition.  Labs Review Labs Reviewed  COMPREHENSIVE METABOLIC PANEL - Abnormal; Notable for the following:    Glucose, Bld 185 (*)    BUN 59 (*)    Creatinine, Ser 2.74 (*)    Albumin 3.4 (*)    AST 45 (*)    GFR calc non Af Amer 16 (*)    GFR calc Af Amer 18 (*)    All other components within normal limits  I-STAT CHEM 8, ED - Abnormal; Notable for the following:    BUN 58 (*)    Creatinine, Ser 2.80 (*)    Glucose, Bld 184 (*)    All other components within normal limits  CBG MONITORING, ED - Abnormal; Notable for the following:    Glucose-Capillary 186 (*)    All other components within normal limits  ETHANOL  PROTIME-INR  APTT  CBC  DIFFERENTIAL  URINE RAPID DRUG SCREEN (HOSP PERFORMED)  URINALYSIS, ROUTINE W REFLEX MICROSCOPIC  I-STAT TROPOININ, ED  I-STAT TROPOININ, ED    Imaging Review Ct Head Wo Contrast  11/14/2014   CLINICAL DATA:  Patient with sudden onset of left upper extremity weakness. Recent fall. History of prior TIAs.  EXAM: CT HEAD WITHOUT CONTRAST  TECHNIQUE: Contiguous axial images were obtained from the base of the skull through the vertex without intravenous contrast.  COMPARISON:  Brain CT 08/21/2014  FINDINGS: Medial right frontal lobe hypodensity and loss of gray-white differentiation. No significant mass effect. No evidence for intracranial hemorrhage. Bilateral cataract surgery. Paranasal sinuses are  unremarkable. Mastoid air cells are unremarkable. Calvarium is intact.  IMPRESSION: Findings compatible with acute to subacute right frontal  lobe infarct in the anterior cerebral artery distribution. No hemorrhage.  Critical Value/emergent results were called by telephone at the time of interpretation on 11/14/2014 at 11:07 am to Dr. Nicole Kindred, who verbally acknowledged these results.   Electronically Signed   By: Lovey Newcomer M.D.   On: 11/14/2014 11:10     EKG Interpretation None     EKG reviewed heart rate 53, no acute ST elevation, poor baseline inferiorly, no acute findings, mild prolonged QT. MDM   Final diagnoses:  Acute thrombotic stroke  Confusion  Left-sided weakness  Acute renal failure, unspecified acute renal failure type   Concern for stroke, code stroke called prior to arrival, patient just outside of window with last seen normal. Patient gradually improving with time. Gen. stroke workup and plan for medicine admission/observation. Neurology consult and saw the patient at the bedside. CT scan results revied  concerning for acute right frontal stroke.  Considered TPA however patient just out of the window and clinically improving. Patient on Plavix.  [facility] in [facility] with full telemetry admission. The patients results and plan were reviewed and discussed.   Any x-rays performed were personally reviewed by myself.   Differential diagnosis were considered with the presenting HPI.  Medications  0.9 %  sodium chloride infusion (not administered)  aspirin suppository 300 mg (300 mg Rectal Given 11/14/14 1158)    Filed Vitals:   11/14/14 1114 11/14/14 1115 11/14/14 1130 11/14/14 1145  BP: 112/48 112/48 119/57 124/53  Pulse: 53 51 52 51  Temp: 97.3 F (36.3 C)     TempSrc: Oral     Resp: _0 SpO2: 96% 100% 100% 100%    Final diagnoses:  Acute thrombotic stroke  Confusion  Left-sided weakness  Acute renal failure, unspecified  acute renal failure type    Admission/ observation were discussed with the admitting physician, patient and/or family and they are comfortable with the plan.     Elnora Morrison, MD 11/14/14 [phone]

## 2014-11-14 NOTE — ED Notes (Signed)
Pt arrives via [facility] from home as Code Stroke.  LSN at 0630 by family, noted to have left sided weakness with arm and leg drift by family and EMS.  Seen at bridge by Dr Jodi MourningZavitz and cleared for CT.  Taken by RR and ED RN with Zoll monitor, VSS, CBG 186.  Dt Roseanne RenoStewart meet pt in CT and evaluated.  Pt back to room 19, not a TPA candidate given LSN, age and hx of DM.  NIHSS 6 and improving in ED.  Family to bedside and updated.  Pt will be admitted for stroke work-up.

## 2014-11-14 NOTE — Evaluation (Signed)
Clinical/Bedside Swallow Evaluation Patient Details  Name: Abigail Brooks MRN: [id number] Date of Birth: 08-12-1935  Today's Date: 11/14/2014 Time: SLP Start Time (ACUTE ONLY): 0418 SLP Stop Time (ACUTE ONLY): 0435 SLP Time Calculation (min) (ACUTE ONLY): 17 min  Past Medical History:  Past Medical History  Diagnosis Date  . Coronary artery disease     stents x 2, sees Dr. Halina Andreas practice  . Congestive heart failure     2012  . Myocardial infarction     "unsure of when"  . Hypertension   . Diabetes mellitus   . Shortness of breath   . Anemia   . Blood transfusion     when had hysterectomy  . Hypothyroidism     has had hx of treatment  . Urinary frequency   . Urinary tract infection     hx of  . GERD (gastroesophageal reflux disease)   . H/O hiatal hernia   . Arthritis   . Depression   . Spinal stenosis     with bone spurs and ruptured disc  . Cataract   . Blood transfusion without reported diagnosis   . Complications affecting other specified body systems, hypertension   . Other malaise and fatigue   . Dysuria   . Unspecified vitamin D deficiency   . Anxiety state, unspecified   . Other B-complex deficiencies   . Abnormality of gait   . Other and unspecified hyperlipidemia   . Stroke    Past Surgical History:  Past Surgical History  Procedure Laterality Date  . Cataract extraction w/ intraocular lens  implant, bilateral    . Bladder suspension    . Coronary      stents x 2, stent implant placed9/21/06  . Back surgery      ruptured disc, lumbar area  . Appendectomy    . Cardiac catheterization    . Lumbar laminectomy/decompression microdiscectomy  12/21/2011    Procedure: LUMBAR LAMINECTOMY/DECOMPRESSION MICRODISCECTOMY 1 LEVEL;  Surgeon: Temple Pacini, MD;  Location: [facility];  Service: Neurosurgery;  Laterality: Bilateral;  Lumbar four-five decompressive lumbar laminectomy; Right Lumbar four-five microdiscectomy  . Eye surgery    . Spine surgery     . Abdominal hysterectomy  1986   HPI:  Pt  is a 80 y.o. female with a history of previous strokes as well as hypertension, hyperlipidemia and diabetes mellitus, brought to the emergency room and code stroke status after developing weakness involving her left extremities per family and patient report. No changes in speech . She has been on Plavix daily for antiplatelet therapy. CT scan of head showed acute to subacute right frontal lobe infarction involving the ACA distribution. NIH stroke score was 6. Patient was beyond time window for treatment consideration with TPA.    Assessment / Plan / Recommendation Clinical Impression  Pt presents with swallow abilities within functional limits. Nursing reports swallow screen fail in ED, early this am secondary to patient accidentally over self medicating at home and experiencing severe lethary upon ED arrival. No overt signs or symptoms of aspiration with thin, puree, or regular consistencies tested. No further swallow intervention indicated. SLE recommended as scheduling permits.      Aspiration Risk  Mild    Diet Recommendation Regular;Thin liquid   Liquid Administration via: Cup;Straw Medication Administration: Whole meds with liquid Supervision: Patient able to self feed;Intermittent supervision to cue for compensatory strategies Compensations: Slow rate;Small sips/bites Postural Changes and/or Swallow Maneuvers: Seated upright 90 degrees;Upright 30-60 min after meal  Other  Recommendations Oral Care Recommendations: Oral care BID   Follow Up Recommendations       Frequency and Duration            SLP Swallow Goals     Swallow Study Prior Functional Status       General Date of Onset: 11/14/14 HPI: Pt  is a 80 y.o. female with a history of previous strokes as well as hypertension, hyperlipidemia and diabetes mellitus, brought to the emergency room and code stroke status after developing weakness involving her left extremities  per family and patient report. No changes in speech . She has been on Plavix daily for antiplatelet therapy. CT scan of head showed acute to subacute right frontal lobe infarction involving the ACA distribution. NIH stroke score was 6. Patient was beyond time window for treatment consideration with TPA.  Type of Study: Bedside swallow evaluation Diet Prior to this Study: NPO Temperature Spikes Noted: No Respiratory Status: Room air History of Recent Intubation: No Behavior/Cognition: Alert;Pleasant mood;Cooperative Oral Cavity - Dentition: Dentures, top;Missing dentition Self-Feeding Abilities: Able to feed self Patient Positioning: Upright in bed Baseline Vocal Quality: Low vocal intensity Volitional Cough: Strong Volitional Swallow: Able to elicit    Oral/Motor/Sensory Function Overall Oral Motor/Sensory Function: Appears within functional limits for tasks assessed Labial ROM: Within Functional Limits Labial Symmetry: Within Functional Limits Labial Strength: Within Functional Limits Labial Sensation: Within Functional Limits Lingual ROM: Within Functional Limits Lingual Symmetry: Within Functional Limits Lingual Strength: Within Functional Limits Facial ROM: Within Functional Limits Facial Symmetry: Within Functional Limits Facial Sensation: Within Functional Limits Velum: Within Functional Limits   Ice Chips Ice chips: Within functional limits Presentation: Self Fed;Spoon   Thin Liquid Thin Liquid: Within functional limits Presentation: Straw;Cup    Nectar Thick Nectar Thick Liquid: Not tested   Honey Thick Honey Thick Liquid: Not tested   Puree Puree: Within functional limits Presentation: Self Fed   Solid   GO    Solid: Within functional limits Presentation: Self Fed      Marcene Duoshelsea Sumney MA, CCC-SLP Acute Care Speech Language Pathologist    Marcene DuosSumney, Teryn Boerema E 11/14/2014,4:49 PM

## 2014-11-14 NOTE — ED Notes (Signed)
Pt at this time alert and oriented x4.

## 2014-11-14 NOTE — Consult Note (Signed)
Admission H&P    Chief Complaint: Acute left-sided weakness.  HPI: Abigail Brooks is an 80 y.o. female with a history of previous strokes as well as hypertension, hyperlipidemia and diabetes mellitus, brought to the emergency room and code stroke status after developing weakness involving her left extremities. She was last seen well at 6:30 AM today. She was noted at around 10:30 AM to have weakness on her left side by family members. Speech was unchanged and face did not appear to be and on either side. She has been on Plavix daily for antiplatelet therapy. CT scan of her head showed findings compatible with acute to subacute right frontal lobe infarction involving the ACA distribution. NIH stroke score was 6. Patient was beyond time window for treatment consideration with TPA in addition to having recent stroke demonstrated on CT scan.  LSN: 6:30 AM on 11/14/2014 tPA Given: No: Beyond time window for treatment consideration and normal CT scan with recent stroke demonstrated mRankin:  Past Medical History  Diagnosis Date  . Coronary artery disease     stents x 2, sees Dr. Halina AndreasSmith, [facility]  . Congestive heart failure     2012  . Myocardial infarction     "unsure of when"  . Hypertension   . Diabetes mellitus   . Shortness of breath   . Anemia   . Blood transfusion     when had hysterectomy  . Hypothyroidism     has had hx of treatment  . Urinary frequency   . Urinary tract infection     hx of  . GERD (gastroesophageal reflux disease)   . H/O hiatal hernia   . Arthritis   . Depression   . Spinal stenosis     with bone spurs and ruptured disc  . Cataract   . Blood transfusion without reported diagnosis   . Complications affecting other specified body systems, hypertension   . Other malaise and fatigue   . Dysuria   . Unspecified vitamin D deficiency   . Anxiety state, unspecified   . Other B-complex deficiencies   . Abnormality of gait   . Other and unspecified  hyperlipidemia   . Stroke     Past Surgical History  Procedure Laterality Date  . Cataract extraction w/ intraocular lens  implant, bilateral    . Bladder suspension    . Coronary      stents x 2, stent implant placed9/21/06  . Back surgery      ruptured disc, lumbar area  . Appendectomy    . Cardiac catheterization    . Lumbar laminectomy/decompression microdiscectomy  12/21/2011    Procedure: LUMBAR LAMINECTOMY/DECOMPRESSION MICRODISCECTOMY 1 LEVEL;  Surgeon: Temple PaciniHenry A Pool, MD;  Location: [facility];  Service: Neurosurgery;  Laterality: Bilateral;  Lumbar four-five decompressive lumbar laminectomy; Right Lumbar four-five microdiscectomy  . Eye surgery    . Spine surgery    . Abdominal hysterectomy  1986    Family History  Problem Relation Age of Onset  . Anesthesia problems Sister   . Anesthesia problems Son   . Thyroid cancer Son   . Heart disease Mother   . Diabetes Sister    Social History:  reports that she has quit smoking. Her smoking use included Cigarettes. Her smokeless tobacco use includes Snuff. She reports that she does not drink alcohol or use illicit drugs.  Allergies:  Allergies  Allergen Reactions  . Morphine And Related Other (See Comments)    Makes her crazy  .  Enalapril Swelling    Facial swelling   Medications: Patient's preadmission medications were reviewed by me.  ROS: History obtained from patient's daughter.  General ROS: negative for - chills, fatigue, fever, night sweats, weight gain or weight loss Psychological ROS: negative for - behavioral disorder, hallucinations, memory difficulties, mood swings or suicidal ideation Ophthalmic ROS: negative for - blurry vision, double vision, eye pain or loss of vision ENT ROS: negative for - epistaxis, nasal discharge, oral lesions, sore throat, tinnitus or vertigo Allergy and Immunology ROS: negative for - hives or itchy/watery eyes Hematological and Lymphatic ROS: negative for - bleeding problems,  bruising or swollen lymph nodes Endocrine ROS: negative for - galactorrhea, hair pattern changes, polydipsia/polyuria or temperature intolerance Respiratory ROS: negative for - cough, hemoptysis, shortness of breath or wheezing Cardiovascular ROS: negative for - chest pain, dyspnea on exertion, edema or irregular heartbeat Gastrointestinal ROS: negative for - abdominal pain, diarrhea, hematemesis, nausea/vomiting or stool incontinence Genito-Urinary ROS: negative for - dysuria, hematuria, incontinence or urinary frequency/urgency Musculoskeletal ROS: negative for - joint swelling or muscular weakness Neurological ROS: as noted in HPI Dermatological ROS: negative for rash and skin lesion changes  Physical Examination: Blood pressure 112/48, pulse 53, temperature 97.3 F (36.3 C), temperature source Oral, resp. rate 16, SpO2 96 %.  HEENT-  Normocephalic, no lesions, without obvious abnormality.  Normal external eye and conjunctiva.  Normal TM's bilaterally.  Normal auditory canals and external ears. Normal external nose, mucus membranes and septum.  Normal pharynx. Neck supple with no masses, nodes, nodules or enlargement. Cardiovascular - regular rate and rhythm, S1, S2 normal, no murmur, click, rub or gallop Lungs - chest clear, no wheezing, rales, normal symmetric air entry Abdomen - soft, non-tender; bowel sounds normal; no masses,  no organomegaly Extremities - no joint deformities, effusion, or inflammation and no skin discoloration  Neurologic Examination: Mental Status: Alert, disoriented to current age and month.  Speech fluent without evidence of aphasia. Able to follow commands without difficulty. Cranial Nerves: II-Visual fields were normal. III/IV/VI-Pupils were equal and reacted normally to light. Extraocular movements were full and conjugate.    V/VII-no facial numbness and no facial weakness. VIII-normal. X-normal speech. XI: trapezius strength/neck flexion strength normal  bilaterally XII-midline tongue extension with normal strength. Motor: Mild drift of left upper and lower extremities: Motor exam otherwise unremarkable Sensory: Normal throughout. Deep Tendon Reflexes: 2+ and symmetric. Plantars: Flexor bilaterally Cerebellar: Normal finger-to-nose testing. Carotid auscultation: Normal  Results for orders placed or performed during the hospital encounter of 11/14/14 (from the past 48 hour(s))  CBG monitoring, ED     Status: Abnormal   Collection Time: 11/14/14 10:50 AM  Result Value Ref Range   Glucose-Capillary 186 (H) 70 - 99 mg/dL  Protime-INR     Status: None   Collection Time: 11/14/14 10:53 AM  Result Value Ref Range   Prothrombin Time 13.7 11.6 - 15.2 seconds   INR 1.04 0.00 - 1.49  APTT     Status: None   Collection Time: 11/14/14 10:53 AM  Result Value Ref Range   aPTT 27 24 - 37 seconds  CBC     Status: None   Collection Time: 11/14/14 10:53 AM  Result Value Ref Range   WBC 8.4 4.0 - 10.5 K/uL   RBC 4.34 3.87 - 5.11 MIL/uL   Hemoglobin 12.9 12.0 - 15.0 g/dL   HCT 16.1 09.6 - 04.5 %   MCV 88.9 78.0 - 100.0 fL   MCH 29.7 26.0 - 34.0  pg   MCHC 33.4 30.0 - 36.0 g/dL   RDW 16.1 09.6 - 04.5 %   Platelets 187 150 - 400 K/uL  Differential     Status: None   Collection Time: 11/14/14 10:53 AM  Result Value Ref Range   Neutrophils Relative % 74 43 - 77 %   Neutro Abs 6.3 1.7 - 7.7 K/uL   Lymphocytes Relative 16 12 - 46 %   Lymphs Abs 1.3 0.7 - 4.0 K/uL   Monocytes Relative 7 3 - 12 %   Monocytes Absolute 0.6 0.1 - 1.0 K/uL   Eosinophils Relative 3 0 - 5 %   Eosinophils Absolute 0.2 0.0 - 0.7 K/uL   Basophils Relative 0 0 - 1 %   Basophils Absolute 0.0 0.0 - 0.1 K/uL  I-Stat Troponin, ED (not at [facility])     Status: None   Collection Time: 11/14/14 10:58 AM  Result Value Ref Range   Troponin i, poc 0.01 0.00 - 0.08 ng/mL   Comment 3            Comment: Due to the release kinetics of cTnI, a negative result within the first hours of  the onset of symptoms does not rule out myocardial infarction with certainty. If myocardial infarction is still suspected, repeat the test at appropriate intervals.   I-Stat Chem 8, ED     Status: Abnormal   Collection Time: 11/14/14 11:00 AM  Result Value Ref Range   Sodium 139 135 - 145 mmol/L   Potassium 4.4 3.5 - 5.1 mmol/L   Chloride 108 96 - 112 mmol/L   BUN 58 (H) 6 - 23 mg/dL   Creatinine, Ser 4.09 (H) 0.50 - 1.10 mg/dL   Glucose, Bld 811 (H) 70 - 99 mg/dL   Calcium, Ion 9.14 7.82 - 1.30 mmol/L   TCO2 20 0 - 100 mmol/L   Hemoglobin 13.3 12.0 - 15.0 g/dL   HCT 95.6 21.3 - 08.6 %   Ct Head Wo Contrast  11/14/2014   CLINICAL DATA:  Patient with sudden onset of left upper extremity weakness. Recent fall. History of prior TIAs.  EXAM: CT HEAD WITHOUT CONTRAST  TECHNIQUE: Contiguous axial images were obtained from the base of the skull through the vertex without intravenous contrast.  COMPARISON:  Brain CT 08/21/2014  FINDINGS: Medial right frontal lobe hypodensity and loss of gray-white differentiation. No significant mass effect. No evidence for intracranial hemorrhage. Bilateral cataract surgery. Paranasal sinuses are unremarkable. Mastoid air cells are unremarkable. Calvarium is intact.  IMPRESSION: Findings compatible with acute to subacute right frontal lobe infarct in the anterior cerebral artery distribution. No hemorrhage.  Critical Value/emergent results were called by telephone at the time of interpretation on 11/14/2014 at 11:07 am to Dr. Roseanne Reno, who verbally acknowledged these results.   Electronically Signed   By: Annia Belt M.D.   On: 11/14/2014 11:10    Assessment: 80 y.o. female with a history of previous strokes as well as significant risk factors for recurrent stroke, presenting with acute right CVA.  Stroke Risk Factors - diabetes mellitus, hyperlipidemia and hypertension  Plan: 1. HgbA1c, fasting lipid panel 2. MRI, MRA  of the brain without contrast 3. PT  consult, OT consult, Speech consult 4. Carotid dopplers 5. Prophylactic therapy-Antiplatelet med: Plavix 6. Risk factor modification 7. Telemetry monitoring  C.R. Roseanne Reno, MD [facility] [phone]  11/14/2014, 11:25 AM

## 2014-11-14 NOTE — ED Notes (Signed)
Pt left sided weakness appears to be resolving. Left arm no longer drifting. Left leg still remains at a 1 for drift. Pt now believes she is 80 years old instead of [age]. Pt now stating the month is March instead of June. More oriented at this time than she was on arrival.

## 2014-11-15 DIAGNOSIS — I6789 Other cerebrovascular disease: Secondary | ICD-10-CM

## 2014-11-15 DIAGNOSIS — I10 Essential (primary) hypertension: Secondary | ICD-10-CM

## 2014-11-15 DIAGNOSIS — I6330 Cerebral infarction due to thrombosis of unspecified cerebral artery: Secondary | ICD-10-CM

## 2014-11-15 DIAGNOSIS — E1129 Type 2 diabetes mellitus with other diabetic kidney complication: Secondary | ICD-10-CM

## 2014-11-15 DIAGNOSIS — E785 Hyperlipidemia, unspecified: Secondary | ICD-10-CM

## 2014-11-15 DIAGNOSIS — N058 Unspecified nephritic syndrome with other morphologic changes: Secondary | ICD-10-CM

## 2014-11-15 LAB — BASIC METABOLIC PANEL
ANION GAP: 10 (ref 5–15)
BUN: 50 mg/dL — ABNORMAL HIGH (ref 6–23)
CHLORIDE: 109 mmol/L (ref 96–112)
CO2: 22 mmol/L (ref 19–32)
Calcium: 9.1 mg/dL (ref 8.4–10.5)
Creatinine, Ser: 2.08 mg/dL — ABNORMAL HIGH (ref 0.50–1.10)
GFR calc non Af Amer: 22 mL/min — ABNORMAL LOW (ref >90)
GFR, EST AFRICAN AMERICAN: 25 mL/min — AB (ref >90)
Glucose, Bld: 218 mg/dL — ABNORMAL HIGH (ref 70–99)
Potassium: 3.9 mmol/L (ref 3.5–5.1)
SODIUM: 141 mmol/L (ref 135–145)

## 2014-11-15 LAB — LIPID PANEL
CHOLESTEROL: 176 mg/dL (ref 0–200)
HDL: 30 mg/dL — ABNORMAL LOW (ref >39)
LDL Cholesterol: 84 mg/dL (ref 0–99)
TRIGLYCERIDES: 312 mg/dL — AB (ref <150)
Total CHOL/HDL Ratio: 5.9 RATIO
VLDL: 62 mg/dL — ABNORMAL HIGH (ref 0–40)

## 2014-11-15 LAB — GLUCOSE, CAPILLARY
GLUCOSE-CAPILLARY: 119 mg/dL — AB (ref 70–99)
GLUCOSE-CAPILLARY: 152 mg/dL — AB (ref 70–99)
GLUCOSE-CAPILLARY: 156 mg/dL — AB (ref 70–99)
GLUCOSE-CAPILLARY: 213 mg/dL — AB (ref 70–99)
GLUCOSE-CAPILLARY: 242 mg/dL — AB (ref 70–99)
Glucose-Capillary: 220 mg/dL — ABNORMAL HIGH (ref 70–99)

## 2014-11-15 MED ORDER — ACETAMINOPHEN 325 MG PO TABS
650.0000 mg | ORAL_TABLET | Freq: Four times a day (QID) | ORAL | Status: DC | PRN
  Administered 2014-11-17 – 2014-11-18 (×3): 650 mg via ORAL
  Filled 2014-11-15 (×3): qty 2

## 2014-11-15 MED ORDER — OXYCODONE-ACETAMINOPHEN 5-325 MG PO TABS
1.0000 | ORAL_TABLET | Freq: Four times a day (QID) | ORAL | Status: DC | PRN
  Administered 2014-11-15 – 2014-11-17 (×5): 1 via ORAL
  Filled 2014-11-15 (×4): qty 1

## 2014-11-15 MED ORDER — ASPIRIN EC 81 MG PO TBEC
81.0000 mg | DELAYED_RELEASE_TABLET | Freq: Every day | ORAL | Status: DC
  Administered 2014-11-16 – 2014-11-18 (×3): 81 mg via ORAL
  Filled 2014-11-15 (×3): qty 1

## 2014-11-15 MED ORDER — METOPROLOL TARTRATE 100 MG PO TABS
100.0000 mg | ORAL_TABLET | Freq: Two times a day (BID) | ORAL | Status: DC
  Administered 2014-11-15 (×2): 100 mg via ORAL
  Filled 2014-11-15 (×3): qty 1

## 2014-11-15 NOTE — Progress Notes (Signed)
Patient's son in to visit; he couldn't come during the morning hours due to his work schedule; he wants to speak to the MD tomorrow; very concerned with her increased confusion and reports this happens with UTI's; seeing Dr.Nesi. He wants to discuss her home medications.

## 2014-11-15 NOTE — Progress Notes (Signed)
Nutrition Brief Note  Patient identified on the Malnutrition Screening Tool (MST) Report  Wt Readings from Last 15 Encounters:  11/14/14 193 lb 9 oz (87.8 kg)  08/21/14 193 lb (87.544 kg)  08/10/14 193 lb (87.544 kg)  07/12/14 194 lb (87.998 kg)  06/16/14 197 lb 9.6 oz (89.631 kg)  05/30/14 188 lb 8 oz (85.503 kg)  05/05/14 198 lb 9.6 oz (90.084 kg)  04/22/14 203 lb (92.08 kg)  04/13/14 203 lb 12.8 oz (92.443 kg)  02/21/14 186 lb 11.7 oz (84.701 kg)  11/25/13 200 lb (90.719 kg)  11/04/13 203 lb (92.08 kg)  10/21/13 199 lb 6.4 oz (90.447 kg)  08/05/13 195 lb 9.6 oz (88.724 kg)  07/01/13 197 lb (89.359 kg)    Body mass index is 39.07 kg/(m^2). Patient meets criteria for Obesity based on current BMI. Weight stable as above.   Current diet order is Heart Healthy/Carb Modified, patient states she is consuming approximately 75-100% of meals at this time. Pt very confused at time of visit; thinks she is in her own home. She states her appetite is good and she has been eating very well recently. She denies following a diet. Labs and medications reviewed.   No nutrition interventions warranted at this time. If nutrition issues arise, please consult RD.   Ian Malkineanne Barnett RD, LDN Inpatient Clinical Dietitian Pager: [phone] After Hours Pager: [phone]

## 2014-11-15 NOTE — Progress Notes (Signed)
Shift Event: Pt with BP 101/33, HR 56. Instructed RN to hold BP meds and allow permissive BP in setting of acute stroke. Will continue to monitor.   Abigail Brooks [facility]

## 2014-11-15 NOTE — Progress Notes (Signed)
PATIENT DETAILS Name: Abigail Brooks Age: 80 y.o. Sex: female Date of Birth: [birth year] Admit Date: 11/14/2014 Admitting Physician Eddie NorthNishant Dhungel, MD ZOX:[id number]:EUBANKS, JESSICA K, NP  Subjective: Mildly confused this morning. But denies major complaints.  Assessment/Plan: Principal Problem:   Acute CVA (cerebral vascular accident): Admitted with acute left-sided weakness, seems to have improved with very minimal left-sided weakness if at all.Marland Kitchen. Speech is clear. CT head on admission showed subacute to acute right frontal lobe infarct, this was confirmed on MRI brain. MRA brain showed no occlusion of right ACA. Patient was on Plavix prior to this admission, currently on Plavix. Awaiting echocardiogram and carotid Doppler. LDL at 84 and not at goal (less than 70), A1c is pending. Will continue current medications and await further recommendations from stroke team.  Active Problems:   Acute renal failure superimposed on stage 3 chronic kidney disease: Etiology likely prerenal azotemia, HCTZ and losartan use. Repeat electrolyte panel today, appears euvolemic and I am, will discontinue IV fluids and monitor.    Dyslipidemia: LDL at 84 and not at goal, Lipitor increased to 80 mg, continue fenofibrate. Recheck lipid panel in 3 months    Essential hypertension, benign: Allow permissive hypertension, have discontinued other antihypertensive medications and currently just on metoprolol with holding parameters.    Dementia with behavioral disturbance: Mild confusion, this morning. Suspect close to her usual baseline      Type 2 diabetes mellitus with renal manifestations, controlled: CBGs stable. Continue SSI. Await his A1c. Resume metformin on discharge.  Disposition: Remain inpatient-SNF on discharge  Antibiotics:  None   Anti-infectives    None      DVT Prophylaxis: Prophylactic  Heparin  Code Status: Full code  Family Communication None at  bedside  Procedures:  None  CONSULTS:  neurology  Time spent 40 minutes-which includes 50% of the time with face-to-face with patient/ family and coordinating care related to the above assessment and plan.  MEDICATIONS: Scheduled Meds: . atorvastatin  80 mg Oral q1800  . buPROPion  100 mg Oral Daily  . calcium-vitamin D  1 tablet Oral Daily  . clopidogrel  75 mg Oral Daily  . fenofibrate  54 mg Oral Daily  . ferrous sulfate  325 mg Oral Q breakfast  . fesoterodine  4 mg Oral Daily  . fish oil-omega-3 fatty acids  1 g Oral Daily  . heparin  5,000 Units Subcutaneous 3 times per day  . insulin aspart  0-15 Units Subcutaneous 6 times per day  . metoprolol  100 mg Oral BID  . senna-docusate  1 tablet Oral BID   Continuous Infusions: . sodium chloride 50 mL/hr at 11/14/14 1315   PRN Meds:.diclofenac sodium, nitroGLYCERIN, phenazopyridine, polyethylene glycol    PHYSICAL EXAM: Vital signs in last 24 hours: Filed Vitals:   11/14/14 2303 11/15/14 0057 11/15/14 0631 11/15/14 1031  BP: 140/54 150/48 159/35 150/58  Pulse: 56 60 62 65  Temp: 97 F (36.1 C) 98.1 F (36.7 C) 98.1 F (36.7 C) 98.4 F (36.9 C)  TempSrc: Oral Oral Oral Oral  Resp: 20 18 18 20   Height:      Weight:      SpO2: 100% 97% 95% 95%    Weight change:  Filed Weights   11/14/14 1306  Weight: 87.8 kg (193 lb 9 oz)   Body mass index is 39.07 kg/(m^2).   Gen Exam: Awake and mostly alert with clear speech.   Neck: Supple, No JVD.  Chest: B/L Clear.   CVS: S1 S2 Regular, no murmurs.  Abdomen: soft, BS +, non tender, non distended.  Extremities: no edema, lower extremities warm to touch. Neurologic: Very minimal left-sided deficits, but mostly nonfocal Skin: No Rash.   Wounds: N/A.    Intake/Output from previous day:  Intake/Output Summary (Last 24 hours) at 11/15/14 1139 Last data filed at 11/14/14 2300  Gross per 24 hour  Intake    240 ml  Output    150 ml  Net     90 ml     LAB  RESULTS: CBC  Recent Labs Lab 11/14/14 1053 11/14/14 1100  WBC 8.4  --   HGB 12.9 13.3  HCT 38.6 39.0  PLT 187  --   MCV 88.9  --   MCH 29.7  --   MCHC 33.4  --   RDW 13.2  --   LYMPHSABS 1.3  --   MONOABS 0.6  --   EOSABS 0.2  --   BASOSABS 0.0  --     Chemistries   Recent Labs Lab 11/14/14 1053 11/14/14 1100  NA 140 139  K 4.5 4.4  CL 105 108  CO2 22  --   GLUCOSE 185* 184*  BUN 59* 58*  CREATININE 2.74* 2.80*  CALCIUM 9.6  --     CBG:  Recent Labs Lab 11/14/14 1619 11/14/14 2038 11/14/14 2357 11/15/14 0403 11/15/14 0836  GLUCAP 100* 233* 152* 156* 220*    GFR Estimated Creatinine Clearance: 15.9 mL/min (by C-G formula based on Cr of 2.8).  Coagulation profile  Recent Labs Lab 11/14/14 1053  INR 1.04    Cardiac Enzymes No results for input(s): CKMB, TROPONINI, MYOGLOBIN in the last 168 hours.  Invalid input(s): CK  Invalid input(s): POCBNP No results for input(s): DDIMER in the last 72 hours. No results for input(s): HGBA1C in the last 72 hours.  Recent Labs  11/15/14 0834  CHOL 176  HDL 30*  LDLCALC 84  TRIG 409*  CHOLHDL 5.9   No results for input(s): TSH, T4TOTAL, T3FREE, THYROIDAB in the last 72 hours.  Invalid input(s): FREET3 No results for input(s): VITAMINB12, FOLATE, FERRITIN, TIBC, IRON, RETICCTPCT in the last 72 hours. No results for input(s): LIPASE, AMYLASE in the last 72 hours.  Urine Studies No results for input(s): UHGB, CRYS in the last 72 hours.  Invalid input(s): UACOL, UAPR, USPG, UPH, UTP, UGL, UKET, UBIL, UNIT, UROB, ULEU, UEPI, UWBC, URBC, UBAC, CAST, UCOM, BILUA  MICROBIOLOGY: No results found for this or any previous visit (from the past 240 hour(s)).  RADIOLOGY STUDIES/RESULTS: Dg Thoracic Spine 2 View  11/09/2014   CLINICAL DATA:  80 year old female with history of trauma from a fall this morning contour back complaining of a upper back pain.  EXAM: THORACIC SPINE - 2 VIEW  COMPARISON:  No  priors.  FINDINGS: Two views of the thoracic spine demonstrate no definite acute displaced fracture or compression type fracture. There is severe multilevel degenerative disc disease, with multiple flowing anterior osteophytes throughout the thoracic spine, which may suggest diffuse idiopathic skeletal hyperostosis (DISH).  IMPRESSION: 1. No acute radiographic abnormality of the thoracic spine. 2. Multilevel degenerative disc disease in the thoracic spine. 3. Possible DISH.   Electronically Signed   By: Trudie Reed M.D.   On: 11/09/2014 18:43   Dg Lumbar Spine Complete  11/09/2014   CLINICAL DATA:  Fall, low back pain  EXAM: LUMBAR SPINE - COMPLETE 4+ VIEW  COMPARISON:  None.  FINDINGS: Five lumbar type vertebral bodies.  Straightening of the normal lumbar lordosis.  No evidence of fracture or dislocation. Vertebral body heights are maintained.  Moderate multilevel degenerative changes, most prominent at L4-5.  Visualized bony pelvis appears intact.  IMPRESSION: No fracture or dislocation is seen.  Moderate degenerative changes.   Electronically Signed   By: Charline Bills M.D.   On: 11/09/2014 18:54   Dg Pelvis 1-2 Views  11/09/2014   CLINICAL DATA:  Fall, low back pain  EXAM: PELVIS - 1-2 VIEW  COMPARISON:  08/21/2014  FINDINGS: No fracture or dislocation is seen.  Bilateral hip joint spaces are narrowed but symmetric.  Degenerative changes of the pubic symphysis.  Moderate degenerative changes of the lower lumbar spine.  IMPRESSION: No fracture or dislocation is seen.   Electronically Signed   By: Charline Bills M.D.   On: 11/09/2014 18:55   Ct Head Wo Contrast  11/14/2014   CLINICAL DATA:  Patient with sudden onset of left upper extremity weakness. Recent fall. History of prior TIAs.  EXAM: CT HEAD WITHOUT CONTRAST  TECHNIQUE: Contiguous axial images were obtained from the base of the skull through the vertex without intravenous contrast.  COMPARISON:  Brain CT 08/21/2014  FINDINGS: Medial  right frontal lobe hypodensity and loss of gray-white differentiation. No significant mass effect. No evidence for intracranial hemorrhage. Bilateral cataract surgery. Paranasal sinuses are unremarkable. Mastoid air cells are unremarkable. Calvarium is intact.  IMPRESSION: Findings compatible with acute to subacute right frontal lobe infarct in the anterior cerebral artery distribution. No hemorrhage.  Critical Value/emergent results were called by telephone at the time of interpretation on 11/14/2014 at 11:07 am to Dr. Roseanne Reno, who verbally acknowledged these results.   Electronically Signed   By: Annia Belt M.D.   On: 11/14/2014 11:10   Mr Brain Wo Contrast  11/14/2014   ADDENDUM REPORT: 11/14/2014 16:57  ADDENDUM: Study discussed by telephone with Dr. Eddie North on 11/14/2014 at 1655 hours.   Electronically Signed   By: Odessa Fleming M.D.   On: 11/14/2014 16:57   11/14/2014   CLINICAL DATA:  80 year old female with acute left side weakness. Evidence of right ACA infarct on noncontrast head CT. Initial encounter.  EXAM: MRI HEAD WITHOUT CONTRAST  MRA HEAD WITHOUT CONTRAST  TECHNIQUE: Multiplanar, multiecho pulse sequences of the brain and surrounding structures were obtained without intravenous contrast. Angiographic images of the head were obtained using MRA technique without contrast.  COMPARISON:  Head CT without contrast 1056 hours today. Brain MRI and MRA 05/29/2014.  FINDINGS: MRI HEAD FINDINGS  The abnormal hypodensity on CT today corresponds to Confluent 2 x 3 x 6 cm area of restricted diffusion in the right ACA territory, including some of the same territory affected by a acute on chronic ischemia on the 2015 comparison.  No contralateral or posterior fossa restricted diffusion. Major intracranial vascular flow voids are preserved except for the right ACA, see MRA details below.  Cytotoxic edema with T2 and FLAIR hyperintensity in the affected parenchyma. Underlying mild chronic encephalomalacia in the  right ACA territory is stable since 2015. No acute intracranial hemorrhage identified. No significant mass effect. No ventriculomegaly. Negative pituitary, cervicomedullary junction and visualized cervical spine. Elsewhere stable gray and white matter signal.  Visible internal auditory structures appear normal. Stable mastoids and paranasal sinuses. Stable orbits soft tissues. Normal bone marrow signal. Visualized scalp soft tissues are within normal limits.  MRA HEAD FINDINGS  Stable antegrade flow in the posterior circulation. Stable  distal vertebral arteries, PICA origins, basilar artery, SCA origins, and bilateral PCA branches.  Stable antegrade flow in both ICA siphons. More motion artifact today than on the 2015 comparison. Stable siphon irregularity compatible with moderate atherosclerosis, but without new or increased stenosis. Both carotid termini remain patent. Both MCA and ACA origins remain patent.  However, there is decreased flow signal today in the right A1 segment, and the previously seen pericallosal occlusion of the right ACA has extended proximally, such that now all the right A2 segment is occluded at or just beyond the anterior communicating artery (series 6, image 123). Left ACA flow signal is stable.  Visualized bilateral MCA branches are stable with mild to moderate irregularity indicating widespread atherosclerosis as before.  IMPRESSION: 1. Confluent right ACA infarct with cytotoxic edema but no hemorrhage or significant mass effect. 2. Associated proximal extension since 2015 of the known right ACA occlusion; the entire right A2 segment is now occluded, with poor flow in the right A1 segment. 3. Otherwise stable brain and intracranial MRA since 05/29/2014.  Electronically Signed: By: Odessa Fleming M.D. On: 11/14/2014 16:47   Mr Maxine Glenn Head/brain Wo Cm  11/14/2014   ADDENDUM REPORT: 11/14/2014 16:57  ADDENDUM: Study discussed by telephone with Dr. Eddie North on 11/14/2014 at 1655 hours.    Electronically Signed   By: Odessa Fleming M.D.   On: 11/14/2014 16:57   11/14/2014   CLINICAL DATA:  80 year old female with acute left side weakness. Evidence of right ACA infarct on noncontrast head CT. Initial encounter.  EXAM: MRI HEAD WITHOUT CONTRAST  MRA HEAD WITHOUT CONTRAST  TECHNIQUE: Multiplanar, multiecho pulse sequences of the brain and surrounding structures were obtained without intravenous contrast. Angiographic images of the head were obtained using MRA technique without contrast.  COMPARISON:  Head CT without contrast 1056 hours today. Brain MRI and MRA 05/29/2014.  FINDINGS: MRI HEAD FINDINGS  The abnormal hypodensity on CT today corresponds to Confluent 2 x 3 x 6 cm area of restricted diffusion in the right ACA territory, including some of the same territory affected by a acute on chronic ischemia on the 2015 comparison.  No contralateral or posterior fossa restricted diffusion. Major intracranial vascular flow voids are preserved except for the right ACA, see MRA details below.  Cytotoxic edema with T2 and FLAIR hyperintensity in the affected parenchyma. Underlying mild chronic encephalomalacia in the right ACA territory is stable since 2015. No acute intracranial hemorrhage identified. No significant mass effect. No ventriculomegaly. Negative pituitary, cervicomedullary junction and visualized cervical spine. Elsewhere stable gray and white matter signal.  Visible internal auditory structures appear normal. Stable mastoids and paranasal sinuses. Stable orbits soft tissues. Normal bone marrow signal. Visualized scalp soft tissues are within normal limits.  MRA HEAD FINDINGS  Stable antegrade flow in the posterior circulation. Stable distal vertebral arteries, PICA origins, basilar artery, SCA origins, and bilateral PCA branches.  Stable antegrade flow in both ICA siphons. More motion artifact today than on the 2015 comparison. Stable siphon irregularity compatible with moderate atherosclerosis, but  without new or increased stenosis. Both carotid termini remain patent. Both MCA and ACA origins remain patent.  However, there is decreased flow signal today in the right A1 segment, and the previously seen pericallosal occlusion of the right ACA has extended proximally, such that now all the right A2 segment is occluded at or just beyond the anterior communicating artery (series 6, image 123). Left ACA flow signal is stable.  Visualized bilateral MCA branches are stable with  mild to moderate irregularity indicating widespread atherosclerosis as before.  IMPRESSION: 1. Confluent right ACA infarct with cytotoxic edema but no hemorrhage or significant mass effect. 2. Associated proximal extension since 2015 of the known right ACA occlusion; the entire right A2 segment is now occluded, with poor flow in the right A1 segment. 3. Otherwise stable brain and intracranial MRA since 05/29/2014.  Electronically Signed: By: Odessa Fleming M.D. On: 11/14/2014 16:47    Jeoffrey Massed, MD  [facility] Pager:[phone]  If 7PM-7AM, please contact night-coverage [email or web address] Password [facility] 11/15/2014, 11:39 AM   LOS: 1 day

## 2014-11-15 NOTE — Evaluation (Signed)
Physical Therapy Evaluation Patient Details Name: Abigail Brooks MRN: [id number] DOB: Aug 21, 1934 Today's Date: 11/15/2014   History of Present Illness  80 year old female with history of diastolic CHF, type 2 diabetes mellitus with diabetic nephropathy, chronic kidney disease stage III with baseline creatinine of 1.3, TIA with left-sided weakness in July 2015, history of CAD status post stents, lumbar stenosis with neurogenic claudications, was brought to the hospital by family after sustaining acute onset left-sided weakness. At baseline patient infrequently uses a walker to ambulate, lives alone and is independent of most of her ADLs. Head CT done in the ED showed acute to subacute right frontal lobe infarct in the anterior cerebral artery distribution without any hemorrhage.  Clinical Impression  Pt admitted with above diagnosis. Pt currently with functional limitations due to the deficits listed below (see PT Problem List). Pt requiring min A for safety with all mobility. This along with memory deficits are leading to decreased safety and recommendation for 24 hr supervision.  Pt will benefit from skilled PT to increase their independence and safety with mobility to allow discharge to the venue listed below.       Follow Up Recommendations SNF;Supervision/Assistance - 24 hour    Equipment Recommendations  None recommended by PT    Recommendations for Other Services OT consult     Precautions / Restrictions Precautions Precautions: Fall Precaution Comments: pt reports that she has fallen at least 4 times since hospitalization in January, has multiple scabs and bruises Restrictions Weight Bearing Restrictions: No      Mobility  Bed Mobility Overal bed mobility: Needs Assistance Bed Mobility: Rolling;Sidelying to Sit Rolling: Min assist Sidelying to sit: Min assist       General bed mobility comments: vc's for sequencing of bed mobility, pt had difficulty initiating task. Min  A to achieve full SL as well as elevation of trunk, heavy use of rail. Min A to scoot to EOB  Transfers Overall transfer level: Needs assistance Equipment used: Rolling walker (2 wheeled) Transfers: Sit to/from Stand Sit to Stand: Min assist         General transfer comment: vc's for pt to push up from bed/ chair however, each time she pushes up on RW and states that this is how she gets up at home, RW stabilized by PT and min A to pt for steadying  Ambulation/Gait Ambulation/Gait assistance: Min assist Ambulation Distance (Feet): 60 Feet Assistive device: Rolling walker (2 wheeled) Gait Pattern/deviations: Step-through pattern;Shuffle;Trunk flexed Gait velocity: decreased Gait velocity interpretation: <1.8 ft/sec, indicative of risk for recurrent falls General Gait Details: pt with shuffling gait, decreased reaction times, min A for safety, especially with turning. Pt easily disoriented when out of room, difficulty finding where she came from just 30' away.  Stairs            Wheelchair Mobility    Modified Rankin (Stroke Patients Only) Modified Rankin (Stroke Patients Only) Pre-Morbid Rankin Score: No significant disability Modified Rankin: Moderately severe disability     Balance Overall balance assessment: Needs assistance Sitting-balance support: Feet supported;No upper extremity supported Sitting balance-Leahy Scale: Fair     Standing balance support: No upper extremity supported;During functional activity Standing balance-Leahy Scale: Fair Standing balance comment: pt able to stand without UE support for pulling up briefs after BSC, but fatigues quickly and knees begin to shake so pt cued to hold to RW with assist given for LB dressing.  Pertinent Vitals/Pain Pain Assessment: Faces Faces Pain Scale: Hurts little more Pain Location: "achy all over" Pain Descriptors / Indicators: Aching Pain Intervention(s): Monitored  during session    Home Living Family/patient expects to be discharged to:: Private residence Living Arrangements: Alone Available Help at Discharge: Family;Available PRN/intermittently Type of Home: House Home Access: Stairs to enter Entrance Stairs-Rails: Right;Left;Can reach both Entrance Stairs-Number of Steps: 3 Home Layout: Two level;Laundry or work area in basement;Able to live on main level with bedroom/bathroom Home Equipment: Environmental consultant - 2 wheels;Cane - single point;Grab bars - tub/shower;Bedside commode Additional Comments: son does laundry in basement, he and granddgtr do the house work and shopping, pt fixes easy prep meals    Prior Function Level of Independence: Independent with assistive device(s)         Comments: mod I for transfers and gait, seated for bathing and dressing assist with household     Hand Dominance   Dominant Hand: Right    Extremity/Trunk Assessment   Upper Extremity Assessment: Defer to OT evaluation           Lower Extremity Assessment: Generalized weakness;RLE deficits/detail;LLE deficits/detail RLE Deficits / Details: dominant side, slightly stronger than left, generalized weakness noted bilaterally, grossly 3+/5, with knees occasionally buckling in standing LLE Deficits / Details: slightly weaker than right side, see right note  Cervical / Trunk Assessment: Kyphotic  Communication   Communication: No difficulties  Cognition Arousal/Alertness: Awake/alert Behavior During Therapy: WFL for tasks assessed/performed Overall Cognitive Status: No family/caregiver present to determine baseline cognitive functioning Area of Impairment: Memory;Safety/judgement;Problem solving     Memory: Decreased short-term memory   Safety/Judgement: Decreased awareness of safety   Problem Solving: Slow processing;Requires verbal cues General Comments: unsure of pt's baseline and could not reach her son on the phone but pt with limited safety due to [facility]  deficits.     General Comments General comments (skin integrity, edema, etc.): do not feel that pt safe to be home alone at this point, will recommend SNF, though pt likely to refuse this. If so, would recommend 24 hr supervision at home with HHPT    Exercises        Assessment/Plan    PT Assessment Patient needs continued PT services  PT Diagnosis Difficulty walking;Abnormality of gait;Generalized weakness   PT Problem List Decreased strength;Decreased activity tolerance;Decreased balance;Decreased mobility;Decreased safety awareness;Decreased knowledge of precautions;Obesity;Pain  PT Treatment Interventions DME instruction;Gait training;Stair training;Functional mobility training;Therapeutic activities;Therapeutic exercise;Balance training;Patient/family education   PT Goals (Current goals can be found in the Care Plan section) Acute Rehab PT Goals Patient Stated Goal: pt wants to go home (today) PT Goal Formulation: With patient Time For Goal Achievement: 11/29/14 Potential to Achieve Goals: Fair    Frequency Min 3X/week   Barriers to discharge Decreased caregiver support lives alone    Co-evaluation               End of Session Equipment Utilized During Treatment: Gait belt Activity Tolerance: Patient tolerated treatment well Patient left: in chair;with chair alarm set;with call bell/phone within reach Nurse Communication: Mobility status         Time: 1024-1059 PT Time Calculation (min) (ACUTE ONLY): 35 min   Charges:   PT Evaluation $Initial PT Evaluation Tier I: 1 Procedure PT Treatments $Gait Training: 8-22 mins   PT G Codes:       Lyanne Co, PT  Acute Rehab Services  [phone]  [place], [place] 11/15/2014, 11:13 AM

## 2014-11-15 NOTE — Progress Notes (Signed)
STROKE TEAM PROGRESS NOTE   HISTORY Abigail Brooks is an 80 y.o. female with a history of previous strokes as well as hypertension, hyperlipidemia and diabetes mellitus, brought to the emergency room and code stroke status after developing weakness involving her left extremities. She was last seen well at 6:30 AM today 11/14/2014. She was noted at around 10:30 AM to have weakness on her left side by family members. Speech was unchanged and face did not appear to be and on either side. She has been on Plavix daily for antiplatelet therapy. CT scan of her head showed findings compatible with acute to subacute right frontal lobe infarction involving the ACA distribution. NIH stroke score was 6. Patient was beyond time window for treatment consideration with TPA in addition to having recent stroke demonstrated on CT scan. She was admitted for further evaluation and treatment.   SUBJECTIVE (INTERVAL HISTORY) No family is at the bedside.  Overall she feels her condition is stable. She is sitting up in the chair eating lunch. She admittedly has a poor memory.   OBJECTIVE Temp:  [97 F (36.1 C)-98.4 F (36.9 C)] 98.4 F (36.9 C) (03/28 1031) Pulse Rate:  [51-83] 65 (03/28 1031) Cardiac Rhythm:  [-] Sinus bradycardia (03/27 1700) Resp:  [12-20] 20 (03/28 1031) BP: (101-159)/(33-77) 150/58 mmHg (03/28 1031) SpO2:  [95 %-100 %] 95 % (03/28 1031) Weight:  [87.8 kg (193 lb 9 oz)] 87.8 kg (193 lb 9 oz) (03/27 1306)   Recent Labs Lab 11/14/14 1619 11/14/14 2038 11/14/14 2357 11/15/14 0403 11/15/14 0836  GLUCAP 100* 233* 152* 156* 220*    Recent Labs Lab 11/14/14 1053 11/14/14 1100  NA 140 139  K 4.5 4.4  CL 105 108  CO2 22  --   GLUCOSE 185* 184*  BUN 59* 58*  CREATININE 2.74* 2.80*  CALCIUM 9.6  --     Recent Labs Lab 11/14/14 1053  AST 45*  ALT 28  ALKPHOS 43  BILITOT 0.6  PROT 6.2  ALBUMIN 3.4*    Recent Labs Lab 11/14/14 1053 11/14/14 1100  WBC 8.4  --    NEUTROABS 6.3  --   HGB 12.9 13.3  HCT 38.6 39.0  MCV 88.9  --   PLT 187  --    No results for input(s): CKTOTAL, CKMB, CKMBINDEX, TROPONINI in the last 168 hours.  Recent Labs  11/14/14 1053  LABPROT 13.7  INR 1.04    Recent Labs  11/14/14 1620  COLORURINE YELLOW  LABSPEC 1.015  PHURINE 5.0  GLUCOSEU NEGATIVE  HGBUR SMALL*  BILIRUBINUR NEGATIVE  KETONESUR NEGATIVE  PROTEINUR NEGATIVE  UROBILINOGEN 0.2  NITRITE NEGATIVE  LEUKOCYTESUR MODERATE*       Component Value Date/Time   CHOL 176 11/15/2014 0834   CHOL 177 08/09/2014 1213   TRIG 312* 11/15/2014 0834   HDL 30* 11/15/2014 0834   HDL 42 08/09/2014 1213   CHOLHDL 5.9 11/15/2014 0834   CHOLHDL 4.2 08/09/2014 1213   VLDL 62* 11/15/2014 0834   LDLCALC 84 11/15/2014 0834   LDLCALC 86 08/09/2014 1213   Lab Results  Component Value Date   HGBA1C 6.4* 08/09/2014      Component Value Date/Time   LABOPIA NONE DETECTED 11/14/2014 1620   COCAINSCRNUR NONE DETECTED 11/14/2014 1620   LABBENZ NONE DETECTED 11/14/2014 1620   AMPHETMU NONE DETECTED 11/14/2014 1620   THCU NONE DETECTED 11/14/2014 1620   LABBARB NONE DETECTED 11/14/2014 1620     Recent Labs Lab 11/14/14 1053  ETH <  5   I have personally reviewed the radiological images below and agree with the radiology interpretations.  Ct Head Wo Contrast 11/14/2014   Findings compatible with acute to subacute right frontal lobe infarct in the anterior cerebral artery distribution. No hemorrhage.    Mri &  Mra Head/brain Wo Cm 11/14/2014    1. Confluent right ACA infarct with cytotoxic edema but no hemorrhage or significant mass effect. 2. Associated proximal extension since 2015 of the known right ACA occlusion; the entire right A2 segment is now occluded, with poor flow in the right A1 segment. 3. Otherwise stable brain and intracranial MRA since 05/29/2014.    Carotid Doppler  There is 1-39% bilateral ICA stenosis. Vertebral artery flow is antegrade.    2D  echo - - Left ventricle: The cavity size was normal. Wall thickness was increased in a pattern of mild LVH. Systolic function was vigorous. The estimated ejection fraction was in the range of 65% to 70%. Wall motion was normal; there were no regional wall motion abnormalities. Doppler parameters are consistent with abnormal left ventricular relaxation (grade 1 diastolic dysfunction). Doppler parameters are consistent with high ventricular filling pressure. - Mitral valve: Calcified annulus. - Left atrium: The atrium was mildly dilated.  Impressions: - Normal LV function; grade 1 diastolic dysfunction with elevated LV filling pressure; mild LAE; sclerotic aortic valve.   PHYSICAL EXAM  Temp:  [97 F (36.1 C)-98.4 F (36.9 C)] 98.4 F (36.9 C) (03/28 2109) Pulse Rate:  [56-81] 81 (03/28 2109) Resp:  [18-20] 18 (03/28 2109) BP: (140-175)/(35-78) 151/77 mmHg (03/28 2109) SpO2:  [95 %-100 %] 97 % (03/28 2109)  General - Well nourished, well developed, in no apparent distress.  Ophthalmologic - fundi not visualized due to incorporation.  Cardiovascular - Regular rate and rhythm.  Mental Status -  Level of arousal and orientation to place were intact, but not orientated to time and president. Language including expression, naming, repetition, comprehension was assessed and found intact.  Cranial Nerves II - XII - II - Visual field intact OU. III, IV, VI - Extraocular movements intact. V - Facial sensation intact bilaterally. VII - Facial movement testing showed left nasolabial fold flattening. VIII - Hearing & vestibular intact bilaterally. X - Palate elevates symmetrically. XI - Chin turning & shoulder shrug intact bilaterally. XII - Tongue protrusion intact.  Motor Strength - The patient's strength was normal in all extremities and pronator drift was absent.  Bulk was normal and fasciculations were absent.   Motor Tone - Muscle tone was assessed at the neck  and appendages and was normal.  Reflexes - The patient's reflexes were normal in all extremities and she had no pathological reflexes.  Sensory - Light touch, temperature/pinprick were assessed and were normal.    Coordination - The patient had normal movements in the hands and feet with no ataxia or dysmetria.  Tremor was absent.  Gait and Station - deferred due to fatigue.   ASSESSMENT/PLAN [name] is a 80 y.o. female with history of previous strokes, hypertension, hyperlipidemia, diabetes mellitus and obesity presenting with acute left sided weakness. She did not receive IV t-PA due to delay in arrival and CT with recent stroke noted.   Stroke:  Non-dominant right ACA infarct embolic secondary to large vessel disease with proximal extension of known right ACA occlusion  MRI  Right ACA infarct  MRA  Proximal extension of known right ACA occlusion  Carotid Doppler  No significant stenosis  2D Echo  unremarkable  Heparin 5000 units sq tid for VTE prophylaxis  Diet heart healthy/carb modified Room service appropriate?: Yes; Fluid consistency:: Thin  clopidogrel 75 mg orally every day prior to admission, now on clopidogrel 75 mg orally every day. Due to large vessel occlusion, we recommend dural antiplatelet for 3 months and then plavix alone. (ordered)  Ongoing aggressive stroke risk factor management  Therapy recommendations:  Skilled nursing facility  Disposition:  Skilled nursing facility. Patient lives home alone prior to admission, independent with most ADLs  Hypertension  Permissive hypertension (OK if < 220/120) but gradually normalize in 5-7 days  Elevated. BP 101-160/33-63 past 24h (11/15/2014 @ 4:08 PM)  On metoprolol   Hyperlipidemia  Home meds:  lipitor   increased to 80 in hospital  LDL 84, goal < 70  Continue statin at discharge  Diabetes  HgbA1c 6.4 in Dec 2015, at goal < 7.0  Controlled   SSI  CBG monitoring  Other Stroke  Risk Factors  Advanced age  Former Cigarette smoker, quit smoking years ago   She uses snuff  Obesity, Body mass index is 39.07 kg/(m^2).   Hx stroke/TIA  Coronary artery disease - s/p stents 2006, MI  Other Active Problems  CHF  Acute renal failure superimposed on stage III chronic kidney disease  Baseline dementia with behavioral disturbance  Other Pertinent History  depression  Hospital day # 1  BIBY,SHARON  [facility] See Amion for Pager information 11/15/2014 12:09 PM   I, the attending vascular neurologist, have personally obtained a history, examined the patient, evaluated laboratory data, individually viewed imaging studies and agree with radiology interpretations. Together with the NP/PA, we formulated the assessment and plan of care which reflects our mutual decision.  I have made any additions or clarifications directly to the above note and agree with the findings and plan as currently documented.   80 yo F with PMH of HTN, HLD, DM, previous ACA stroke admitted for left side weakness. MRI showed right ACA infarct again. MRA showed proximal A2 occlusion progressed from 05/29/14. LDL 84 and A1C 6.4. Increased statin to  lipitor. Recommend dural antiplatelet for 3 months. Await CIR.   Neurology will sign off. Please call with questions. Pt will follow up with Dr. Roda Shutters at [facility] in about 2 months. Thanks for the consult.  Marvel Plan, MD PhD Stroke Neurology 11/15/2014 10:53 PM  To contact Stroke Continuity provider, please refer to [email or web address]. After hours, contact General Neurology

## 2014-11-15 NOTE — Progress Notes (Signed)
VASCULAR LAB PRELIMINARY  PRELIMINARY  PRELIMINARY  PRELIMINARY  Bilateral carotid duplex completed.    Preliminary report:  Bilateral carotid stenosis within the 1-39% stenosis range.   Patent vertebral arteries with antegrade flow bilaterally.  Loralie ChampagneBishop, Myisha Pickerel F, RVT 11/15/2014, 3:56 PM

## 2014-11-15 NOTE — Progress Notes (Signed)
*  PRELIMINARY RESULTS* Echocardiogram 2D Echocardiogram has been performed.  Jeryl ColumbiaLLIOTT, Estuardo Frisbee 11/15/2014, 3:21 PM

## 2014-11-16 LAB — GLUCOSE, CAPILLARY
GLUCOSE-CAPILLARY: 167 mg/dL — AB (ref 70–99)
GLUCOSE-CAPILLARY: 176 mg/dL — AB (ref 70–99)
GLUCOSE-CAPILLARY: 182 mg/dL — AB (ref 70–99)
GLUCOSE-CAPILLARY: 214 mg/dL — AB (ref 70–99)
Glucose-Capillary: 148 mg/dL — ABNORMAL HIGH (ref 70–99)
Glucose-Capillary: 208 mg/dL — ABNORMAL HIGH (ref 70–99)

## 2014-11-16 LAB — HEMOGLOBIN A1C
HEMOGLOBIN A1C: 7.3 % — AB (ref 4.8–5.6)
Mean Plasma Glucose: 163 mg/dL

## 2014-11-16 LAB — BASIC METABOLIC PANEL
Anion gap: 7 (ref 5–15)
BUN: 40 mg/dL — ABNORMAL HIGH (ref 6–23)
CALCIUM: 9.2 mg/dL (ref 8.4–10.5)
CHLORIDE: 104 mmol/L (ref 96–112)
CO2: 28 mmol/L (ref 19–32)
CREATININE: 1.41 mg/dL — AB (ref 0.50–1.10)
GFR calc non Af Amer: 35 mL/min — ABNORMAL LOW (ref >90)
GFR, EST AFRICAN AMERICAN: 40 mL/min — AB (ref >90)
GLUCOSE: 178 mg/dL — AB (ref 70–99)
POTASSIUM: 3.7 mmol/L (ref 3.5–5.1)
Sodium: 139 mmol/L (ref 135–145)

## 2014-11-16 MED ORDER — CEFTRIAXONE SODIUM IN DEXTROSE 20 MG/ML IV SOLN
1.0000 g | INTRAVENOUS | Status: DC
  Administered 2014-11-16 – 2014-11-18 (×3): 1 g via INTRAVENOUS
  Filled 2014-11-16 (×3): qty 50

## 2014-11-16 MED ORDER — INSULIN ASPART 100 UNIT/ML ~~LOC~~ SOLN
0.0000 [IU] | Freq: Three times a day (TID) | SUBCUTANEOUS | Status: DC
  Administered 2014-11-16: 2 [IU] via SUBCUTANEOUS
  Administered 2014-11-16: 5 [IU] via SUBCUTANEOUS
  Administered 2014-11-17: 3 [IU] via SUBCUTANEOUS
  Administered 2014-11-17: 5 [IU] via SUBCUTANEOUS
  Administered 2014-11-17 – 2014-11-18 (×3): 3 [IU] via SUBCUTANEOUS

## 2014-11-16 MED ORDER — METOPROLOL TARTRATE 25 MG PO TABS
25.0000 mg | ORAL_TABLET | Freq: Two times a day (BID) | ORAL | Status: DC
  Administered 2014-11-16 – 2014-11-18 (×4): 25 mg via ORAL
  Filled 2014-11-16 (×4): qty 1

## 2014-11-16 MED ORDER — HYDRALAZINE HCL 20 MG/ML IJ SOLN
10.0000 mg | Freq: Once | INTRAMUSCULAR | Status: AC
  Administered 2014-11-16: 10 mg via INTRAVENOUS
  Filled 2014-11-16: qty 1

## 2014-11-16 NOTE — Progress Notes (Signed)
PATIENT DETAILS Name: Abigail Brooks Age: 80 y.o. Sex: female Date of Birth: 1934-11-27 Admit Date: 11/14/2014 Admitting Physician Eddie North, MD ZOX:WRUEAVW, JESSICA K, NP  Subjective: Patient  bradycardic and confused.  Assessment/Plan: Principal Problem:   Acute CVA (cerebral vascular accident): Admitted with acute left-sided weakness, seems to have improved with very minimal left-sided weakness if at all.Marland Kitchen Speech is clear. CT head on admission showed subacute to acute right frontal lobe infarct, this was confirmed on MRI brain. MRA brain showed no occlusion of right ACA. Patient was on Plavix prior to this admission, currently on Plavix. echocardiogram and carotid Doppler within normal limits. LDL at 84 and not at goal (less than 70), hemoglobin A1c 7.3. Triglycerides elevated.  Continue statin Due to large vessel occlusion, neurology recommend dural antiplatelet for 3 months and then plavix alone. (ordered)   UTI Started the patient on Rocephin Ordered urine culture    Acute renal failure superimposed on stage 3 chronic kidney disease: Improving, Etiology likely prerenal azotemia, HCTZ and losartan use. Repeat electrolyte panel today, appears euvolemic and I am, will discontinue IV fluids and monitor.    Dyslipidemia: LDL at 84 and not at goal, triglycerides 312. Lipitor increased to 80 mg, continue fenofibrate. Recheck lipid panel in 3 months    Essential hypertension, benign: Allow permissive hypertension, have discontinued other antihypertensive medications and currently just on metoprolol, dose reduced because of bradycardia to 25 mg twice a day with hold parameters.    Dementia with behavioral disturbance: Mild confusion, this morning. Suspect close to her usual baseline      Type 2 diabetes mellitus with renal manifestations, controlled: CBGs stable. Continue SSI. Await his A1c. Resume metformin on discharge. Change Accu-Cheks to 3 times a day before  meals  Disposition: Anticipate discharge to SNF in one to 2 days.  Antibiotics:  None   Anti-infectives    Start     Dose/Rate Route Frequency Ordered Stop   11/16/14 1100  cefTRIAXone (ROCEPHIN) 1 g in dextrose 5 % 50 mL IVPB - Premix     1 g 100 mL/hr over 30 Minutes Intravenous Every 24 hours 11/16/14 1046        DVT Prophylaxis: Prophylactic  Heparin  Code Status: Full code  Family Communication None at bedside  Procedures:  None  CONSULTS:  neurology  Time spent 40 minutes-which includes 50% of the time with face-to-face with patient/ family and coordinating care related to the above assessment and plan.  MEDICATIONS: Scheduled Meds: . aspirin EC  81 mg Oral Daily  . atorvastatin  80 mg Oral q1800  . buPROPion  100 mg Oral Daily  . calcium-vitamin D  1 tablet Oral Daily  . cefTRIAXone (ROCEPHIN)  IV  1 g Intravenous Q24H  . clopidogrel  75 mg Oral Daily  . fenofibrate  54 mg Oral Daily  . ferrous sulfate  325 mg Oral Q breakfast  . fish oil-omega-3 fatty acids  1 g Oral Daily  . heparin  5,000 Units Subcutaneous 3 times per day  . insulin aspart  0-15 Units Subcutaneous TID WC  . metoprolol  25 mg Oral BID  . senna-docusate  1 tablet Oral BID   Continuous Infusions:   PRN Meds:.acetaminophen, diclofenac sodium, nitroGLYCERIN, oxyCODONE-acetaminophen, polyethylene glycol    PHYSICAL EXAM: Vital signs in last 24 hours: Filed Vitals:   11/15/14 2109 11/16/14 0141 11/16/14 0556 11/16/14 0927  BP: 151/77 157/81 193/69 186/65  Pulse: 81 58  54 55  Temp: 98.4 F (36.9 C) 98 F (36.7 C) 98.1 F (36.7 C) 98.5 F (36.9 C)  TempSrc: Oral Oral Oral Oral  Resp: 18 18 19 20   Height:      Weight:      SpO2: 97% 97% 97% 95%    Weight change:  Filed Weights   11/14/14 1306  Weight: 87.8 kg (193 lb 9 oz)   Body mass index is 39.07 kg/(m^2).   Gen Exam: Awake and mostly alert with clear speech.   Neck: Supple, No JVD.   Chest: B/L Clear.   CVS:  S1 S2 Regular, no murmurs.  Abdomen: soft, BS +, non tender, non distended.  Extremities: no edema, lower extremities warm to touch. Neurologic: Very minimal left-sided deficits, but mostly nonfocal Skin: No Rash.   Wounds: N/A.    Intake/Output from previous day:  Intake/Output Summary (Last 24 hours) at 11/16/14 1048 Last data filed at 11/15/14 2326  Gross per 24 hour  Intake    240 ml  Output      0 ml  Net    240 ml     LAB RESULTS: CBC  Recent Labs Lab 11/14/14 1053 11/14/14 1100  WBC 8.4  --   HGB 12.9 13.3  HCT 38.6 39.0  PLT 187  --   MCV 88.9  --   MCH 29.7  --   MCHC 33.4  --   RDW 13.2  --   LYMPHSABS 1.3  --   MONOABS 0.6  --   EOSABS 0.2  --   BASOSABS 0.0  --     Chemistries   Recent Labs Lab 11/14/14 1053 11/14/14 1100 11/15/14 1233 11/16/14 0610  NA 140 139 141 139  K 4.5 4.4 3.9 3.7  CL 105 108 109 104  CO2 22  --  22 28  GLUCOSE 185* 184* 218* 178*  BUN 59* 58* 50* 40*  CREATININE 2.74* 2.80* 2.08* 1.41*  CALCIUM 9.6  --  9.1 9.2    CBG:  Recent Labs Lab 11/15/14 1241 11/15/14 1701 11/15/14 2002 11/15/14 2356 11/16/14 0414  GLUCAP 213* 119* 242* 182* 176*    GFR Estimated Creatinine Clearance: 31.7 mL/min (by C-G formula based on Cr of 1.41).  Coagulation profile  Recent Labs Lab 11/14/14 1053  INR 1.04    Cardiac Enzymes No results for input(s): CKMB, TROPONINI, MYOGLOBIN in the last 168 hours.  Invalid input(s): CK  Invalid input(s): POCBNP No results for input(s): DDIMER in the last 72 hours.  Recent Labs  11/15/14 0834  HGBA1C 7.3*    Recent Labs  11/15/14 0834  CHOL 176  HDL 30*  LDLCALC 84  TRIG 161312*  CHOLHDL 5.9   No results for input(s): TSH, T4TOTAL, T3FREE, THYROIDAB in the last 72 hours.  Invalid input(s): FREET3 No results for input(s): VITAMINB12, FOLATE, FERRITIN, TIBC, IRON, RETICCTPCT in the last 72 hours. No results for input(s): LIPASE, AMYLASE in the last 72 hours.  Urine  Studies No results for input(s): UHGB, CRYS in the last 72 hours.  Invalid input(s): UACOL, UAPR, USPG, UPH, UTP, UGL, UKET, UBIL, UNIT, UROB, ULEU, UEPI, UWBC, URBC, UBAC, CAST, UCOM, BILUA  MICROBIOLOGY: No results found for this or any previous visit (from the past 240 hour(s)).  RADIOLOGY STUDIES/RESULTS: Dg Thoracic Spine 2 View  11/09/2014   CLINICAL DATA:  80 year old female with history of trauma from a fall this morning contour back complaining of a upper back pain.  EXAM: THORACIC SPINE -  2 VIEW  COMPARISON:  No priors.  FINDINGS: Two views of the thoracic spine demonstrate no definite acute displaced fracture or compression type fracture. There is severe multilevel degenerative disc disease, with multiple flowing anterior osteophytes throughout the thoracic spine, which may suggest diffuse idiopathic skeletal hyperostosis (DISH).  IMPRESSION: 1. No acute radiographic abnormality of the thoracic spine. 2. Multilevel degenerative disc disease in the thoracic spine. 3. Possible DISH.   Electronically Signed   By: Trudie Reed M.D.   On: 11/09/2014 18:43   Dg Lumbar Spine Complete  11/09/2014   CLINICAL DATA:  Fall, low back pain  EXAM: LUMBAR SPINE - COMPLETE 4+ VIEW  COMPARISON:  None.  FINDINGS: Five lumbar type vertebral bodies.  Straightening of the normal lumbar lordosis.  No evidence of fracture or dislocation. Vertebral body heights are maintained.  Moderate multilevel degenerative changes, most prominent at L4-5.  Visualized bony pelvis appears intact.  IMPRESSION: No fracture or dislocation is seen.  Moderate degenerative changes.   Electronically Signed   By: Charline Bills M.D.   On: 11/09/2014 18:54   Dg Pelvis 1-2 Views  11/09/2014   CLINICAL DATA:  Fall, low back pain  EXAM: PELVIS - 1-2 VIEW  COMPARISON:  08/21/2014  FINDINGS: No fracture or dislocation is seen.  Bilateral hip joint spaces are narrowed but symmetric.  Degenerative changes of the pubic symphysis.   Moderate degenerative changes of the lower lumbar spine.  IMPRESSION: No fracture or dislocation is seen.   Electronically Signed   By: Charline Bills M.D.   On: 11/09/2014 18:55   Ct Head Wo Contrast  11/14/2014   CLINICAL DATA:  Patient with sudden onset of left upper extremity weakness. Recent fall. History of prior TIAs.  EXAM: CT HEAD WITHOUT CONTRAST  TECHNIQUE: Contiguous axial images were obtained from the base of the skull through the vertex without intravenous contrast.  COMPARISON:  Brain CT 08/21/2014  FINDINGS: Medial right frontal lobe hypodensity and loss of gray-white differentiation. No significant mass effect. No evidence for intracranial hemorrhage. Bilateral cataract surgery. Paranasal sinuses are unremarkable. Mastoid air cells are unremarkable. Calvarium is intact.  IMPRESSION: Findings compatible with acute to subacute right frontal lobe infarct in the anterior cerebral artery distribution. No hemorrhage.  Critical Value/emergent results were called by telephone at the time of interpretation on 11/14/2014 at 11:07 am to Dr. Roseanne Reno, who verbally acknowledged these results.   Electronically Signed   By: Annia Belt M.D.   On: 11/14/2014 11:10   Mr Brain Wo Contrast  11/14/2014   ADDENDUM REPORT: 11/14/2014 16:57  ADDENDUM: Study discussed by telephone with Dr. Eddie North on 11/14/2014 at 1655 hours.   Electronically Signed   By: Odessa Fleming M.D.   On: 11/14/2014 16:57   11/14/2014   CLINICAL DATA:  80 year old female with acute left side weakness. Evidence of right ACA infarct on noncontrast head CT. Initial encounter.  EXAM: MRI HEAD WITHOUT CONTRAST  MRA HEAD WITHOUT CONTRAST  TECHNIQUE: Multiplanar, multiecho pulse sequences of the brain and surrounding structures were obtained without intravenous contrast. Angiographic images of the head were obtained using MRA technique without contrast.  COMPARISON:  Head CT without contrast 1056 hours today. Brain MRI and MRA 05/29/2014.   FINDINGS: MRI HEAD FINDINGS  The abnormal hypodensity on CT today corresponds to Confluent 2 x 3 x 6 cm area of restricted diffusion in the right ACA territory, including some of the same territory affected by a acute on chronic ischemia on the  2015 comparison.  No contralateral or posterior fossa restricted diffusion. Major intracranial vascular flow voids are preserved except for the right ACA, see MRA details below.  Cytotoxic edema with T2 and FLAIR hyperintensity in the affected parenchyma. Underlying mild chronic encephalomalacia in the right ACA territory is stable since 2015. No acute intracranial hemorrhage identified. No significant mass effect. No ventriculomegaly. Negative pituitary, cervicomedullary junction and visualized cervical spine. Elsewhere stable gray and white matter signal.  Visible internal auditory structures appear normal. Stable mastoids and paranasal sinuses. Stable orbits soft tissues. Normal bone marrow signal. Visualized scalp soft tissues are within normal limits.  MRA HEAD FINDINGS  Stable antegrade flow in the posterior circulation. Stable distal vertebral arteries, PICA origins, basilar artery, SCA origins, and bilateral PCA branches.  Stable antegrade flow in both ICA siphons. More motion artifact today than on the 2015 comparison. Stable siphon irregularity compatible with moderate atherosclerosis, but without new or increased stenosis. Both carotid termini remain patent. Both MCA and ACA origins remain patent.  However, there is decreased flow signal today in the right A1 segment, and the previously seen pericallosal occlusion of the right ACA has extended proximally, such that now all the right A2 segment is occluded at or just beyond the anterior communicating artery (series 6, image 123). Left ACA flow signal is stable.  Visualized bilateral MCA branches are stable with mild to moderate irregularity indicating widespread atherosclerosis as before.  IMPRESSION: 1. Confluent  right ACA infarct with cytotoxic edema but no hemorrhage or significant mass effect. 2. Associated proximal extension since 2015 of the known right ACA occlusion; the entire right A2 segment is now occluded, with poor flow in the right A1 segment. 3. Otherwise stable brain and intracranial MRA since 05/29/2014.  Electronically Signed: By: Odessa Fleming M.D. On: 11/14/2014 16:47   Mr Maxine Glenn Head/brain Wo Cm  11/14/2014   ADDENDUM REPORT: 11/14/2014 16:57  ADDENDUM: Study discussed by telephone with Dr. Eddie North on 11/14/2014 at 1655 hours.   Electronically Signed   By: Odessa Fleming M.D.   On: 11/14/2014 16:57   11/14/2014   CLINICAL DATA:  80 year old female with acute left side weakness. Evidence of right ACA infarct on noncontrast head CT. Initial encounter.  EXAM: MRI HEAD WITHOUT CONTRAST  MRA HEAD WITHOUT CONTRAST  TECHNIQUE: Multiplanar, multiecho pulse sequences of the brain and surrounding structures were obtained without intravenous contrast. Angiographic images of the head were obtained using MRA technique without contrast.  COMPARISON:  Head CT without contrast 1056 hours today. Brain MRI and MRA 05/29/2014.  FINDINGS: MRI HEAD FINDINGS  The abnormal hypodensity on CT today corresponds to Confluent 2 x 3 x 6 cm area of restricted diffusion in the right ACA territory, including some of the same territory affected by a acute on chronic ischemia on the 2015 comparison.  No contralateral or posterior fossa restricted diffusion. Major intracranial vascular flow voids are preserved except for the right ACA, see MRA details below.  Cytotoxic edema with T2 and FLAIR hyperintensity in the affected parenchyma. Underlying mild chronic encephalomalacia in the right ACA territory is stable since 2015. No acute intracranial hemorrhage identified. No significant mass effect. No ventriculomegaly. Negative pituitary, cervicomedullary junction and visualized cervical spine. Elsewhere stable gray and white matter signal.   Visible internal auditory structures appear normal. Stable mastoids and paranasal sinuses. Stable orbits soft tissues. Normal bone marrow signal. Visualized scalp soft tissues are within normal limits.  MRA HEAD FINDINGS  Stable antegrade flow in the posterior circulation.  Stable distal vertebral arteries, PICA origins, basilar artery, SCA origins, and bilateral PCA branches.  Stable antegrade flow in both ICA siphons. More motion artifact today than on the 2015 comparison. Stable siphon irregularity compatible with moderate atherosclerosis, but without new or increased stenosis. Both carotid termini remain patent. Both MCA and ACA origins remain patent.  However, there is decreased flow signal today in the right A1 segment, and the previously seen pericallosal occlusion of the right ACA has extended proximally, such that now all the right A2 segment is occluded at or just beyond the anterior communicating artery (series 6, image 123). Left ACA flow signal is stable.  Visualized bilateral MCA branches are stable with mild to moderate irregularity indicating widespread atherosclerosis as before.  IMPRESSION: 1. Confluent right ACA infarct with cytotoxic edema but no hemorrhage or significant mass effect. 2. Associated proximal extension since 2015 of the known right ACA occlusion; the entire right A2 segment is now occluded, with poor flow in the right A1 segment. 3. Otherwise stable brain and intracranial MRA since 05/29/2014.  Electronically Signed: By: Odessa Fleming M.D. On: 11/14/2014 16:47    Richarda Overlie, MD  [facility] Pager:[phone] If 7PM-7AM, please contact night-coverage [email or web address] Password [email or web address] 11/16/2014, 10:48 AM   LOS: 2 days

## 2014-11-16 NOTE — Progress Notes (Signed)
Physical Therapy Treatment Patient Details Name: Abigail Brooks MRN: [id number] DOB: September 21, 1934 Today's Date: 11/16/2014    History of Present Illness 80 year old female with history of diastolic CHF, type 2 diabetes mellitus with diabetic nephropathy, chronic kidney disease stage III with baseline creatinine of 1.3, TIA with left-sided weakness in July 2015, history of CAD status post stents, lumbar stenosis with neurogenic claudications, was brought to the hospital by family after sustaining acute onset left-sided weakness. At baseline patient infrequently uses a walker to ambulate, lives alone and is independent of most of her ADLs. Head CT done in the ED showed acute to subacute right frontal lobe infarct in the anterior cerebral artery distribution without any hemorrhage.    PT Comments    Patient is progressing well with ambulation. Still with some confusion and decreased safety awareness. Continue to recommend SNF for ongoing Physical Therapy.     Follow Up Recommendations  SNF;Supervision/Assistance - 24 hour     Equipment Recommendations  None recommended by PT    Recommendations for Other Services       Precautions / Restrictions Precautions Precautions: Fall Precaution Comments: pt reports that she has fallen at least 4 times since hospitalization in January, has multiple scabs and bruises    Mobility  Bed Mobility               General bed mobility comments: Patient up in recliner before and after session  Transfers Overall transfer level: Needs assistance Equipment used: Rolling walker (2 wheeled)   Sit to Stand: Min assist         General transfer comment: Cues for hand placement and not to pull up on RW. A to ensure balance and power up.   Ambulation/Gait Ambulation/Gait assistance: Min guard Ambulation Distance (Feet): 120 Feet Assistive device: Rolling walker (2 wheeled) Gait Pattern/deviations: Step-through pattern;Decreased stride  length;Shuffle     General Gait Details: Patient continues to shuffling gait and needing cues for safety and pathfinding.    Stairs            Wheelchair Mobility    Modified Rankin (Stroke Patients Only) Modified Rankin (Stroke Patients Only) Pre-Morbid Rankin Score: No significant disability Modified Rankin: Moderately severe disability     Balance     Sitting balance-Leahy Scale: Fair       Standing balance-Leahy Scale: Fair                      Cognition Arousal/Alertness: Awake/alert Behavior During Therapy: WFL for tasks assessed/performed Overall Cognitive Status: No family/caregiver present to determine baseline cognitive functioning       Memory: Decreased short-term memory   Safety/Judgement: Decreased awareness of safety          Exercises      General Comments        Pertinent Vitals/Pain Pain Assessment: No/denies pain    Home Living                      Prior Function            PT Goals (current goals can now be found in the care plan section) Progress towards PT goals: Progressing toward goals    Frequency  Min 3X/week    PT Plan Current plan remains appropriate    Co-evaluation             End of Session Equipment Utilized During Treatment: Gait belt Activity Tolerance: Patient tolerated treatment well Patient left:  in chair;with chair alarm set;with call bell/phone within reach     Time: 0805-0830 PT Time Calculation (min) (ACUTE ONLY): 25 min  Charges:  $Gait Training: 8-22 mins $Therapeutic Activity: 8-22 mins                    G Codes:      Fredrich BirksRobinette, Kyoko Elsea Elizabeth 11/16/2014, 11:32 AM  11/16/2014 Fredrich Birksobinette, Naly Schwanz Elizabeth PTA [phone] pager [phone] office

## 2014-11-16 NOTE — Clinical Social Work Placement (Addendum)
Clinical Social Work Department CLINICAL SOCIAL WORK PLACEMENT NOTE 11/16/2014  Patient:  Abigail Brooks,Abigail Brooks  Account Number:  [id number] Admit date:  11/14/2014  Clinical Social Worker:  Ashok CordiaYSHEKA Tyria Springer, LCSWA  Date/time:  11/16/2014 01:20 PM  Clinical Social Work is seeking post-discharge placement for this patient at the following level of care:   SKILLED NURSING   (*CSW will update this form in Epic as items are completed)   11/16/2014  [facility]/family provided with [facility] Department of Clinical Social Work's list of facilities offering this level of care within the geographic area requested by the patient (or if unable, by the patient's family).  11/16/2014  Patient/family informed of their freedom to choose among providers that offer the needed level of care, that participate in Medicare, Medicaid or managed care program needed by the patient, have an available bed and are willing to accept the patient.  11/16/2014  Patient/family informed of [facility]' ownership interest in [facility], as well as of the fact that they are under no obligation to receive care at this facility.  PASARR submitted to EDS on  PASARR number received on   FL2 transmitted to all facilities in geographic area requested by pt/family on  11/16/2014 FL2 transmitted to all facilities within larger geographic area on 11/16/2014  Patient informed that his/her managed care company has contracts with or will negotiate with  certain facilities, including the following:     Patient/family informed of bed offers received:  11/16/2014 [facility] Physician recommends and patient chooses bed at    [facility]    Patient to be transferred to facility by [facility]    Patient and family notified of transfer on 11/18/2014 Name of family member notified:  Jamelle Haringavid Ehrmann   The following physician request were entered in Epic:   Additional  Comments: Pt has a existing PASSAR   Ivelis Norgard, MSW, LCSWA [phone]

## 2014-11-16 NOTE — Evaluation (Signed)
Speech Language Pathology Evaluation Patient Details Name: Abigail Brooks MRN: [id number] DOB: 10-25-1934 Today's Date: 11/16/2014 Time: 4098-1191 SLP Time Calculation (min) (ACUTE ONLY): 21 min  Problem List:  Patient Active Problem List   Diagnosis Date Noted  . HLD (hyperlipidemia)   . Acute ischemic stroke 11/14/2014  . Acute thrombotic stroke 11/14/2014  . Acute renal failure superimposed on stage 3 chronic kidney disease 11/14/2014  . Mild cognitive impairment 08/26/2014  . UTI (lower urinary tract infection) 08/21/2014  . Syncope and collapse 08/21/2014  . CKD (chronic kidney disease) stage 3, GFR 30-59 ml/min 08/10/2014  . Anemia, iron deficiency 08/10/2014  . 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 Stroke 05/29/2014  . Type 2 diabetes mellitus with renal manifestations, controlled 05/05/2014  . Arthritis, senescent 05/05/2014  . Falls frequently 04/22/2014  . TIA (transient ischemic attack) 02/23/2014  . CVA (cerebral vascular accident) 02/21/2014  . Left-sided weakness 02/21/2014  . CVA (cerebral infarction) 02/21/2014  . Dementia with behavioral disturbance 11/25/2013  . Overactive bladder 11/04/2013  . Essential hypertension, benign 10/21/2013  . Memory loss 10/21/2013  . Delusional disorder 10/21/2013  . UTI (urinary tract infection) 10/21/2013  . Falls 08/05/2013  . Syncope 07/01/2013    Class: Acute  . Unspecified constipation 06/24/2013  . Routine general medical examination at a health care facility 06/24/2013  . Cartilage disorder 06/24/2013  . Other and unspecified hyperlipidemia 06/23/2013  . Hypertension associated with diabetes 11/18/2012  . Type II or unspecified type diabetes mellitus without mention of complication, not stated as uncontrolled 11/18/2012  . CAD (coronary artery disease) 11/18/2012  . Depression 11/18/2012  . Anxiety state, unspecified 11/18/2012  . Chronic diastolic CHF (congestive heart failure) 11/18/2012  . Lumbar  stenosis with neurogenic claudication 12/21/2011   Past Medical History:  Past Medical History  Diagnosis Date  . Coronary artery disease     stents x 2, sees Dr. Halina Andreas practice  . Congestive heart failure     2012  . Myocardial infarction     "unsure of when"  . Hypertension   . Diabetes mellitus   . Shortness of breath   . Anemia   . Blood transfusion     when had hysterectomy  . Hypothyroidism     has had hx of treatment  . Urinary frequency   . Urinary tract infection     hx of  . GERD (gastroesophageal reflux disease)   . H/O hiatal hernia   . Arthritis   . Depression   . Spinal stenosis     with bone spurs and ruptured disc  . Cataract   . Blood transfusion without reported diagnosis   . Complications affecting other specified body systems, hypertension   . Other malaise and fatigue   . Dysuria   . Unspecified vitamin D deficiency   . Anxiety state, unspecified   . Other B-complex deficiencies   . Abnormality of gait   . Other and unspecified hyperlipidemia   . Stroke    Past Surgical History:  Past Surgical History  Procedure Laterality Date  . Cataract extraction w/ intraocular lens  implant, bilateral    . Bladder suspension    . Coronary      stents x 2, stent implant placed9/21/06  . Back surgery      ruptured disc, lumbar area  . Appendectomy    . Cardiac catheterization    . Lumbar laminectomy/decompression microdiscectomy  12/21/2011    Procedure: LUMBAR LAMINECTOMY/DECOMPRESSION MICRODISCECTOMY 1 LEVEL;  Surgeon: Temple PaciniHenry A Pool, MD;  Location: [facility];  Service: Neurosurgery;  Laterality: Bilateral;  Lumbar four-five decompressive lumbar laminectomy; Right Lumbar four-five microdiscectomy  . Eye surgery    . Spine surgery    . Abdominal hysterectomy  1986   HPI:  Pt  is a 80 y.o. female with a history of previous strokes as well as hypertension, hyperlipidemia and diabetes mellitus, brought to the emergency room and code stroke status  after developing weakness involving her left extremities per family and patient report. No changes in speech . She has been on Plavix daily for antiplatelet therapy. CT scan of head showed acute to subacute right frontal lobe infarction involving the ACA distribution. NIH stroke score was 6. Patient was beyond time window for treatment consideration with TPA.    Assessment / Plan / Recommendation Clinical Impression  Patient demonstrates moderately impaired cognition characterized by disorientation to time and situation, impaired storage and retrieval of new and old information, intellectual awareness of deficits and therefore the inability to safely solve basic self-care tasks.  As a result, recommend skilled SLP services during acute stay as well as in the next level or care to maximize functional independence and reduce overall burden of care.  Anticipate that patient will require 24/7 supervision upon discharge from the hospital to ensure safety.     SLP Assessment  Patient needs continued Speech Lanaguage Pathology Services    Follow Up Recommendations  24 hour supervision/assistance;Home health SLP;Skilled Nursing facility    Frequency and Duration min 2x/week  1 week   Pertinent Vitals/Pain Pain Assessment: No/denies pain   SLP Goals  Patient/Family Stated Goal: to go home Potential to Achieve Goals (ACUTE ONLY): Good Potential Considerations (ACUTE ONLY): Ability to learn/carryover information;Cooperation/participation level;Family/community support;Severity of impairments;Previous level of function  SLP Evaluation Prior Functioning  Cognitive/Linguistic Baseline: Within functional limits (per patient report) Type of Home: House  Lives With: Alone Available Help at Discharge: Family;Available PRN/intermittently Vocation: Retired   IT consultantCognition  Overall Cognitive Status: No family/caregiver present to determine baseline cognitive functioning Orientation Level: Oriented to  person;Oriented to place;Disoriented to time;Disoriented to situation Attention: Sustained Sustained Attention: Appears intact Memory: Impaired Memory Impairment: Storage deficit;Retrieval deficit;Decreased recall of new information;Decreased long term memory;Decreased short term memory Decreased Long Term Memory: Verbal basic Decreased Short Term Memory: Verbal basic;Functional basic Awareness: Impaired Awareness Impairment: Intellectual impairment Problem Solving: Impaired Problem Solving Impairment: Verbal basic;Functional basic Executive Function: Self Monitoring;Self Correcting Self Monitoring: Impaired Self Monitoring Impairment: Functional basic Self Correcting: Impaired Self Correcting Impairment: Functional basic Safety/Judgment: Impaired    Comprehension  Auditory Comprehension Overall Auditory Comprehension: Appears within functional limits for tasks assessed Visual Recognition/Discrimination Discrimination: Within Function Limits Reading Comprehension Reading Status: Within funtional limits    Expression Expression Primary Mode of Expression: Verbal Verbal Expression Overall Verbal Expression: Appears within functional limits for tasks assessed Written Expression Dominant Hand: Right Written Expression: Within Functional Limits   Oral / Motor Oral Motor/Sensory Function Overall Oral Motor/Sensory Function: Appears within functional limits for tasks assessed Motor Speech Overall Motor Speech: Appears within functional limits for tasks assessed   GO    Charlane FerrettiMelissa Fayrene Towner, M.A., CCC-SLP [phone]  Dandrea Widdowson 11/16/2014, 1:16 PM

## 2014-11-16 NOTE — Evaluation (Signed)
Occupational Therapy Evaluation Patient Details Name: TREASA BRADSHAW MRN: [id number] DOB: 03-07-1935 Today's Date: 11/16/2014    History of Present Illness 80 year old female with history of diastolic CHF, type 2 diabetes mellitus with diabetic nephropathy, chronic kidney disease stage III with baseline creatinine of 1.3, TIA with left-sided weakness in July 2015, history of CAD status post stents, lumbar stenosis with neurogenic claudications, was brought to the hospital by family after sustaining acute onset left-sided weakness. At baseline patient infrequently uses a walker to ambulate, lives alone and is independent of most of her ADLs. Head CT done in the ED showed acute to subacute right frontal lobe infarct in the anterior cerebral artery distribution without any hemorrhage.   Clinical Impression   Pt admitted with the above diagnoses and presents with below problem list. Pt will benefit from continued OT to address the below listed deficits and maximize independence with BADLs prior to d/c to venue below. PTA pt was mod I with ADLs. Currently pt is at supervision level for ADLs. Feel pt would benefit from SNF prior to returning home. If pt does d/c home will need 24 hour supervision.assistance and HHOT. OT to continue to follow acutely.     Follow Up Recommendations  SNF;Supervision/Assistance - 24 hour    Equipment Recommendations  Other (comment) (TBD)    Recommendations for Other Services       Precautions / Restrictions Precautions Precautions: Fall Precaution Comments: pt reports that she has fallen at least 4 times since hospitalization in January, has multiple scabs and bruises Restrictions Weight Bearing Restrictions: No      Mobility Bed Mobility               General bed mobility comments: in recliner  Transfers Overall transfer level: Needs assistance Equipment used: Rolling walker (2 wheeled) Transfers: Sit to/from Stand Sit to Stand: Min guard          General transfer comment: cues for hand placement to not have both hands on rw during sit<>stand; no physical assist, no LOB    Balance Overall balance assessment: Needs assistance Sitting-balance support: No upper extremity supported;Feet supported Sitting balance-Leahy Scale: Fair     Standing balance support: Bilateral upper extremity supported;During functional activity Standing balance-Leahy Scale: Fair                              ADL Overall ADL's : Needs assistance/impaired Eating/Feeding: Set up;Sitting   Grooming: Set up;Sitting;Standing;Supervision/safety Grooming Details (indicate cue type and reason): supervision in standing; pt noted to rest arms on sink during oral care Upper Body Bathing: Supervision/ safety;Sitting   Lower Body Bathing: Supervison/ safety;Sit to/from stand   Upper Body Dressing : Set up;Sitting   Lower Body Dressing: Supervision/safety;Sit to/from stand   Toilet Transfer: Supervision/safety;Ambulation;Comfort height toilet;Grab bars;RW   Toileting- Clothing Manipulation and Hygiene: Supervision/safety;Sit to/from stand   Tub/ Shower Transfer: Supervision/safety;Ambulation;3 in 1;Rolling walker   Functional mobility during ADLs: Supervision/safety;Rolling walker General ADL Comments: Pt completed household distance ambulation and grooming in standing at supervision level with no LOB. Able to don/doff socks in seated position.     Vision Additional Comments: vision to be assessed at next session   Perception     Praxis      Pertinent Vitals/Pain Pain Assessment: Faces Pain Score: 4  Faces Pain Scale: Hurts little more Pain Location: back  Pain Descriptors / Indicators: Aching Pain Intervention(s): Monitored during session  Hand Dominance Right   Extremity/Trunk Assessment Upper Extremity Assessment Upper Extremity Assessment: Overall WFL for tasks assessed   Lower Extremity Assessment Lower Extremity  Assessment: Defer to PT evaluation       Communication Communication Communication: No difficulties   Cognition Arousal/Alertness: Awake/alert Behavior During Therapy: WFL for tasks assessed/performed Overall Cognitive Status: No family/caregiver present to determine baseline cognitive functioning Area of Impairment: Memory;Safety/judgement     Memory: Decreased short-term memory   Safety/Judgement: Decreased awareness of safety     General Comments: Pt ambulated in-room and hallway at supervision level. Completed oral care standing at sink at supervision level. Oriented x4.    General Comments       Exercises       Shoulder Instructions      Home Living Family/patient expects to be discharged to:: Private residence Living Arrangements: Alone Available Help at Discharge: Family;Available PRN/intermittently Type of Home: House Home Access: Stairs to enter Entergy CorporationEntrance Stairs-Number of Steps: 3 Entrance Stairs-Rails: Right;Left;Can reach both Home Layout: Two level;Laundry or work area in basement;Able to live on main level with bedroom/bathroom     Bathroom Shower/Tub: Producer, television/film/videoWalk-in shower   Bathroom Toilet: Standard     Home Equipment: Environmental consultantWalker - 2 wheels;Cane - single point;Grab bars - tub/shower;Bedside commode;Hand held shower head;Shower seat   Additional Comments: son does laundry in basement, he and granddgtr do the house work and shopping, pt fixes easy prep meals  Lives With: Alone    Prior Functioning/Environment Level of Independence: Independent with assistive device(s)        Comments: mod I for transfers and gait, seated for bathing and dressing assist with household    OT Diagnosis: Cognitive deficits   OT Problem List: Impaired balance (sitting and/or standing);Decreased cognition;Decreased safety awareness;Decreased knowledge of use of DME or AE   OT Treatment/Interventions: Self-care/ADL training;Therapeutic exercise;Energy conservation;DME and/or AE  instruction;Therapeutic activities;Visual/perceptual remediation/compensation;Patient/family education;Balance training;Cognitive remediation/compensation    OT Goals(Current goals can be found in the care plan section) Acute Rehab OT Goals OT Goal Formulation: With patient Time For Goal Achievement: 11/30/14 Potential to Achieve Goals: Good ADL Goals Pt Will Perform Upper Body Dressing: with modified independence;sitting Pt Will Perform Lower Body Dressing: with modified independence;sit to/from stand Pt Will Transfer to Toilet: with modified independence;ambulating Pt Will Perform Toileting - Clothing Manipulation and hygiene: with modified independence;sit to/from stand Pt Will Perform Tub/Shower Transfer: with modified independence;shower seat;rolling walker  OT Frequency: Min 2X/week   Barriers to D/C: Decreased caregiver support  intermittent assist from family PTA       Co-evaluation              End of Session Equipment Utilized During Treatment: Gait belt;Rolling walker  Activity Tolerance: Patient tolerated treatment well Patient left: in chair;with call bell/phone within reach;with chair alarm set;with nursing/sitter in room   Time: 1136-1201 OT Time Calculation (min): 25 min Charges:  OT General Charges $OT Visit: 1 Procedure OT Evaluation $Initial OT Evaluation Tier I: 1 Procedure G-Codes:    Pilar GrammesMathews, Sharonlee Nine H 11/16/2014, 2:47 PM

## 2014-11-16 NOTE — Clinical Social Work Psychosocial (Signed)
Clinical Social Work Department BRIEF PSYCHOSOCIAL ASSESSMENT 11/16/2014  Patient:  Abigail Brooks,Abigail Brooks     Account Number:  [id number]     Admit date:  11/14/2014  Clinical Social Worker:  Bo McclintockBIBBS,Delonna Ney, LCSWA  Date/Time:  11/16/2014 01:02 PM  Referred by:  RN  Date Referred:  11/16/2014 Referred for  SNF Placement   Other Referral:   Interview type:  Family Other interview type:   Pt not disoriented; Abigail Brooks [phone]    PSYCHOSOCIAL DATA Living Status:  ALONE Admitted from facility:   Level of care:   Primary support name:  Abigail Brooks Primary support relationship to patient:  CHILD, ADULT Degree of support available:   Strong System Support    CURRENT CONCERNS Current Concerns  None Noted   Other Concerns:    SOCIAL WORK ASSESSMENT / PLAN CSW spoke with son Abigail Brooks.  CSW introduced self and purpose of the call. CSW discussed clinical recommendation for SNF rehab. CSW inquired about the geographical location in which Abigail Brooks would like the pt to receive rehab from. CSW explained the SNF rehab process to the Abigail Brooks. CSW and Abigail Brooks discussed insurance and its relation to SNF rehab. CSW answered all questions in which the Abigail Brooks inquired about. CSW provided Abigail Abigail Brooks with contact information for further questions. CSW will continue to follow this pt and assist with discharge as needed.   Assessment/plan status:  Psychosocial Support/Ongoing Assessment of Needs Other assessment/ plan:   Information/referral to community resources:    PATIENT'S/FAMILY'S RESPONSE TO CURRENT DIAGNOSE:  Abigail Brooks reported feelings of being indifferent. Abigail Brooks reported discussing the pt current diagnose with his wife to determine the appropriate level of long-term care.     PATIENT'S/FAMILY'S RESPONSE TO PLAN OF CARE: Abigail Brooks perfer Abigail Brooks for SNF placement.   [name], MSW, LCSWA [phone]

## 2014-11-16 NOTE — Progress Notes (Signed)
Inpatient Diabetes Program Recommendations  AACE/ADA: New Consensus Statement on Inpatient Glycemic Control (2013)  Target Ranges:  Prepandial:   less than 140 mg/dL      Peak postprandial:   less than 180 mg/dL (1-2 hours)      Critically ill patients:  140 - 180 mg/dL     Noted patient on Metformin 500 mg/day. Pt with CKD 3-concern this renal status using metformin at home/  Thank you Lenor CoffinAnn Hensley Treat, RN, MSN, CDE  Diabetes Inpatient Program Office: [phone] Pager: [phone] 8:00 am to 5:00 pm

## 2014-11-17 LAB — GLUCOSE, CAPILLARY
GLUCOSE-CAPILLARY: 211 mg/dL — AB (ref 70–99)
GLUCOSE-CAPILLARY: 167 mg/dL — AB (ref 70–99)
Glucose-Capillary: 173 mg/dL — ABNORMAL HIGH (ref 70–99)
Glucose-Capillary: 177 mg/dL — ABNORMAL HIGH (ref 70–99)

## 2014-11-17 LAB — URINE CULTURE: Colony Count: 50000

## 2014-11-17 MED ORDER — QUETIAPINE FUMARATE 25 MG PO TABS
25.0000 mg | ORAL_TABLET | Freq: Every day | ORAL | Status: DC
  Administered 2014-11-17: 25 mg via ORAL
  Filled 2014-11-17: qty 1

## 2014-11-17 MED ORDER — SODIUM CHLORIDE 0.9 % IV SOLN
INTRAVENOUS | Status: DC
  Administered 2014-11-17 – 2014-11-18 (×2): via INTRAVENOUS

## 2014-11-17 MED ORDER — OMEGA-3-ACID ETHYL ESTERS 1 G PO CAPS
1.0000 g | ORAL_CAPSULE | Freq: Every day | ORAL | Status: DC
  Administered 2014-11-18: 1 g via ORAL
  Filled 2014-11-17: qty 1

## 2014-11-17 NOTE — Progress Notes (Signed)
Inpatient Diabetes Program Recommendations  AACE/ADA: New Consensus Statement on Inpatient Glycemic Control (2013)  Target Ranges:  Prepandial:   less than 140 mg/dL      Peak postprandial:   less than 180 mg/dL (1-2 hours)      Critically ill patients:  140 - 180 mg/dL   Reason for Assessment:  Results for Michela PitcherHODGIN, Tamarra L (MRN [id number]) as of 11/17/2014 10:28  Ref. Range 11/16/2014 07:47 11/16/2014 11:27 11/16/2014 16:16 11/16/2014 19:53 11/17/2014 06:52  Glucose-Capillary Latest Range: 70-99 mg/dL 829167 (H) 562148 (H) 130208 (H) 214 (H) 173 (H)    Diabetes history: Type 2 diabetes Outpatient Diabetes medications: Metformin 500 mg daily,  Current orders for Inpatient glycemic control:  Novolog moderate tid with meals  May consider adding low dose basal insulin.  Consider Lantus 8 units daily.   Thanks, Beryl MeagerJenny Brandilynn Taormina, RN, BC-ADM Inpatient Diabetes Coordinator Pager [phone]

## 2014-11-17 NOTE — Progress Notes (Signed)
[facility] PROGRESS NOTE  CAILEIGH CANCHE ZOX:[id number] DOB: 23-May-1935 DOA: 11/14/2014 PCP: Sharon Seller, NP  Assessment/Plan: 1.          PATIENT DETAILS Name: ANU STAGNER Age: 80 y.o. Sex: female Date of Birth: December 09, 1934 Admit Date: 11/14/2014 Admitting Physician Eddie North, MD WJX:BJYNWGN, JESSICA K, NP  Subjective: Patient  is oriented. Per staff patient is walking to the bathroom with help.   Assessment/Plan: Principal Problem:   Acute CVA (cerebral vascular accident):  -Admitted with acute left-sided weakness. -Continues to have improvement very minimal left-sided weakness. -CT head on admission showed subacute to acute right frontal lobe infarct, this was confirmed on MRI brain. MRA brain showed no occlusion of right ACA. - Patient was on Plavix prior to this admission, currently on Plavix. -Echocardiogram and carotid Doppler within normal limits. - LDL at 84 and not at goal (less than 70), hemoglobin A1c 7.3. Triglycerides elevated.  -Continue statin Due to large vessel occlusion, neurology recommend dural antiplatelet for 3 months and then plavix alone. (ordered)   UTI mild -Started the patient on Rocephin -urine culture are pending    Acute renal failure superimposed on stage 3 chronic kidney disease:  -Improving, Etiology likely prerenal azotemia, HCTZ and losartan. -continue IV fluids . - Recheck a BMP in a.m.     Dyslipidemia: - LDL at 84 and not at goal, triglycerides 312. Lipitor increased to 80 mg, continue fenofibrate.  -Recheck lipid panel in 3 months    Essential hypertension, benign:  -Allow permissive hypertension. -discontinued other antihypertensive medications and currently just on metoprolol, dose reduced because of bradycardia to 25 mg twice a day with hold parameters.    Dementia with behavioral disturbance: - Mild confusion, this morning. - Could be from sundowning - Suspect close to her usual baseline -Added  Seroquel at bedtime      Type 2 diabetes mellitus with renal manifestations, controlled: - CBGs stable.  -Continue SSI.  - A1c.   Hold metformin considering renal insufficiency  Disposition: Anticipate discharge to SNF tomorrow [facility] Charter has been DC'd  Antibiotics:  None   Anti-infectives    Start     Dose/Rate Route Frequency Ordered Stop   11/16/14 1100  cefTRIAXone (ROCEPHIN) 1 g in dextrose 5 % 50 mL IVPB - Premix     1 g 100 mL/hr over 30 Minutes Intravenous Every 24 hours 11/16/14 1046        DVT Prophylaxis: Prophylactic  Heparin  Code Status: Full code  Family Communication None at bedside  Procedures:  None  CONSULTS:  neurology  Time spent 40 minutes-which includes 50% of the time with face-to-face with patient/ family and coordinating care related to the above assessment and plan.  MEDICATIONS: Scheduled Meds: . aspirin EC  81 mg Oral Daily  . atorvastatin  80 mg Oral q1800  . buPROPion  100 mg Oral Daily  . calcium-vitamin D  1 tablet Oral Daily  . cefTRIAXone (ROCEPHIN)  IV  1 g Intravenous Q24H  . clopidogrel  75 mg Oral Daily  . fenofibrate  54 mg Oral Daily  . ferrous sulfate  325 mg Oral Q breakfast  . heparin  5,000 Units Subcutaneous 3 times per day  . insulin aspart  0-15 Units Subcutaneous TID WC  . metoprolol  25 mg Oral BID  . omega-3 acid ethyl esters  1 g Oral Daily  . QUEtiapine  25 mg Oral QHS  . senna-docusate  1 tablet Oral BID   Continuous  Infusions:   PRN Meds:.acetaminophen, diclofenac sodium, nitroGLYCERIN, polyethylene glycol    PHYSICAL EXAM: Vital signs in last 24 hours: Filed Vitals:   11/16/14 2152 11/17/14 0138 11/17/14 0544 11/17/14 0927  BP: 196/79 174/60 162/63 162/70  Pulse: 68 59 57 77  Temp: 98.6 F (37 C) 98.4 F (36.9 C) 97.8 F (36.6 C) 97.8 F (36.6 C)  TempSrc: Oral Oral Oral   Resp: Height:      Weight:      SpO2: 96% 98% 97% 97%    Weight change:  Filed Weights    11/14/14 1306  Weight: 87.8 kg (193 lb 9 oz)   Body mass index is 39.07 kg/(m^2).   Gen Exam: Awake and mostly alert with clear speech.   Neck: Supple, No JVD.   Chest: B/L Clear.   CVS: S1 S2 Regular, no murmurs.  Abdomen: soft, BS +, non tender, non distended.  Extremities: no edema, lower extremities warm to touch. Neurologic: mostly nonfocal. Oriented to place,person and time Skin: No Rash.   Wounds: N/A.    Intake/Output from previous day:  Intake/Output Summary (Last 24 hours) at 11/17/14 1539 Last data filed at 11/17/14 0830  Gross per 24 hour  Intake    600 ml  Output      0 ml  Net    600 ml     LAB RESULTS: CBC  Recent Labs Lab 11/14/14 1053 11/14/14 1100  WBC 8.4  --   HGB 12.9 13.3  HCT 38.6 39.0  PLT 187  --   MCV 88.9  --   MCH 29.7  --   MCHC 33.4  --   RDW 13.2  --   LYMPHSABS 1.3  --   MONOABS 0.6  --   EOSABS 0.2  --   BASOSABS 0.0  --     Chemistries   Recent Labs Lab 11/14/14 1053 11/14/14 1100 11/15/14 1233 11/16/14 0610  NA 140 139 141 139  K 4.5 4.4 3.9 3.7  CL 105 108 109 104  CO2 22  --  22 28  GLUCOSE 185* 184* 218* 178*  BUN 59* 58* 50* 40*  CREATININE 2.74* 2.80* 2.08* 1.41*  CALCIUM 9.6  --  9.1 9.2    CBG:  Recent Labs Lab 11/16/14 1127 11/16/14 1616 11/16/14 1953 11/17/14 0652 11/17/14 1125  GLUCAP 148* 208* 214* 173* 211*    GFR Estimated Creatinine Clearance: 31.7 mL/min (by C-G formula based on Cr of 1.41).  Coagulation profile  Recent Labs Lab 11/14/14 1053  INR 1.04    Cardiac Enzymes No results for input(s): CKMB, TROPONINI, MYOGLOBIN in the last 168 hours.  Invalid input(s): CK  Invalid input(s): POCBNP No results for input(s): DDIMER in the last 72 hours.  Recent Labs  11/15/14 0834  HGBA1C 7.3*    Recent Labs  11/15/14 0834  CHOL 176  HDL 30*  LDLCALC 84  TRIG 161*  CHOLHDL 5.9   No results for input(s): TSH, T4TOTAL, T3FREE, THYROIDAB in the last 72 hours.  Invalid  input(s): FREET3 No results for input(s): VITAMINB12, FOLATE, FERRITIN, TIBC, IRON, RETICCTPCT in the last 72 hours. No results for input(s): LIPASE, AMYLASE in the last 72 hours.  Urine Studies No results for input(s): UHGB, CRYS in the last 72 hours.  Invalid input(s): UACOL, UAPR, USPG, UPH, UTP, UGL, UKET, UBIL, UNIT, UROB, ULEU, UEPI, UWBC, URBC, UBAC, CAST, UCOM, BILUA  MICROBIOLOGY: No results found for this or any previous visit (  from the past 240 hour(s)).  RADIOLOGY STUDIES/RESULTS: Dg Thoracic Spine 2 View  11/09/2014   CLINICAL DATA:  80 year old female with history of trauma from a fall this morning contour back complaining of a upper back pain.  EXAM: THORACIC SPINE - 2 VIEW  COMPARISON:  No priors.  FINDINGS: Two views of the thoracic spine demonstrate no definite acute displaced fracture or compression type fracture. There is severe multilevel degenerative disc disease, with multiple flowing anterior osteophytes throughout the thoracic spine, which may suggest diffuse idiopathic skeletal hyperostosis (DISH).  IMPRESSION: 1. No acute radiographic abnormality of the thoracic spine. 2. Multilevel degenerative disc disease in the thoracic spine. 3. Possible DISH.   Electronically Signed   By: Trudie Reed M.D.   On: 11/09/2014 18:43   Dg Lumbar Spine Complete  11/09/2014   CLINICAL DATA:  Fall, low back pain  EXAM: LUMBAR SPINE - COMPLETE 4+ VIEW  COMPARISON:  None.  FINDINGS: Five lumbar type vertebral bodies.  Straightening of the normal lumbar lordosis.  No evidence of fracture or dislocation. Vertebral body heights are maintained.  Moderate multilevel degenerative changes, most prominent at L4-5.  Visualized bony pelvis appears intact.  IMPRESSION: No fracture or dislocation is seen.  Moderate degenerative changes.   Electronically Signed   By: Charline Bills M.D.   On: 11/09/2014 18:54   Dg Pelvis 1-2 Views  11/09/2014   CLINICAL DATA:  Fall, low back pain  EXAM: PELVIS -  1-2 VIEW  COMPARISON:  08/21/2014  FINDINGS: No fracture or dislocation is seen.  Bilateral hip joint spaces are narrowed but symmetric.  Degenerative changes of the pubic symphysis.  Moderate degenerative changes of the lower lumbar spine.  IMPRESSION: No fracture or dislocation is seen.   Electronically Signed   By: Charline Bills M.D.   On: 11/09/2014 18:55   Ct Head Wo Contrast  11/14/2014   CLINICAL DATA:  Patient with sudden onset of left upper extremity weakness. Recent fall. History of prior TIAs.  EXAM: CT HEAD WITHOUT CONTRAST  TECHNIQUE: Contiguous axial images were obtained from the base of the skull through the vertex without intravenous contrast.  COMPARISON:  Brain CT 08/21/2014  FINDINGS: Medial right frontal lobe hypodensity and loss of gray-white differentiation. No significant mass effect. No evidence for intracranial hemorrhage. Bilateral cataract surgery. Paranasal sinuses are unremarkable. Mastoid air cells are unremarkable. Calvarium is intact.  IMPRESSION: Findings compatible with acute to subacute right frontal lobe infarct in the anterior cerebral artery distribution. No hemorrhage.  Critical Value/emergent results were called by telephone at the time of interpretation on 11/14/2014 at 11:07 am to Dr. Roseanne Reno, who verbally acknowledged these results.   Electronically Signed   By: Annia Belt M.D.   On: 11/14/2014 11:10   Mr Brain Wo Contrast  11/14/2014   ADDENDUM REPORT: 11/14/2014 16:57  ADDENDUM: Study discussed by telephone with Dr. Eddie North on 11/14/2014 at 1655 hours.   Electronically Signed   By: Odessa Fleming M.D.   On: 11/14/2014 16:57   11/14/2014   CLINICAL DATA:  80 year old female with acute left side weakness. Evidence of right ACA infarct on noncontrast head CT. Initial encounter.  EXAM: MRI HEAD WITHOUT CONTRAST  MRA HEAD WITHOUT CONTRAST  TECHNIQUE: Multiplanar, multiecho pulse sequences of the brain and surrounding structures were obtained without intravenous  contrast. Angiographic images of the head were obtained using MRA technique without contrast.  COMPARISON:  Head CT without contrast 1056 hours today. Brain MRI and MRA 05/29/2014.  FINDINGS: MRI HEAD FINDINGS  The abnormal hypodensity on CT today corresponds to Confluent 2 x 3 x 6 cm area of restricted diffusion in the right ACA territory, including some of the same territory affected by a acute on chronic ischemia on the 2015 comparison.  No contralateral or posterior fossa restricted diffusion. Major intracranial vascular flow voids are preserved except for the right ACA, see MRA details below.  Cytotoxic edema with T2 and FLAIR hyperintensity in the affected parenchyma. Underlying mild chronic encephalomalacia in the right ACA territory is stable since 2015. No acute intracranial hemorrhage identified. No significant mass effect. No ventriculomegaly. Negative pituitary, cervicomedullary junction and visualized cervical spine. Elsewhere stable gray and white matter signal.  Visible internal auditory structures appear normal. Stable mastoids and paranasal sinuses. Stable orbits soft tissues. Normal bone marrow signal. Visualized scalp soft tissues are within normal limits.  MRA HEAD FINDINGS  Stable antegrade flow in the posterior circulation. Stable distal vertebral arteries, PICA origins, basilar artery, SCA origins, and bilateral PCA branches.  Stable antegrade flow in both ICA siphons. More motion artifact today than on the 2015 comparison. Stable siphon irregularity compatible with moderate atherosclerosis, but without new or increased stenosis. Both carotid termini remain patent. Both MCA and ACA origins remain patent.  However, there is decreased flow signal today in the right A1 segment, and the previously seen pericallosal occlusion of the right ACA has extended proximally, such that now all the right A2 segment is occluded at or just beyond the anterior communicating artery (series 6, image 123). Left  ACA flow signal is stable.  Visualized bilateral MCA branches are stable with mild to moderate irregularity indicating widespread atherosclerosis as before.  IMPRESSION: 1. Confluent right ACA infarct with cytotoxic edema but no hemorrhage or significant mass effect. 2. Associated proximal extension since 2015 of the known right ACA occlusion; the entire right A2 segment is now occluded, with poor flow in the right A1 segment. 3. Otherwise stable brain and intracranial MRA since 05/29/2014.  Electronically Signed: By: Odessa Fleming M.D. On: 11/14/2014 16:47   Mr Maxine Glenn Head/brain Wo Cm  11/14/2014   ADDENDUM REPORT: 11/14/2014 16:57  ADDENDUM: Study discussed by telephone with Dr. Eddie North on 11/14/2014 at 1655 hours.   Electronically Signed   By: Odessa Fleming M.D.   On: 11/14/2014 16:57   11/14/2014   CLINICAL DATA:  80 year old female with acute left side weakness. Evidence of right ACA infarct on noncontrast head CT. Initial encounter.  EXAM: MRI HEAD WITHOUT CONTRAST  MRA HEAD WITHOUT CONTRAST  TECHNIQUE: Multiplanar, multiecho pulse sequences of the brain and surrounding structures were obtained without intravenous contrast. Angiographic images of the head were obtained using MRA technique without contrast.  COMPARISON:  Head CT without contrast 1056 hours today. Brain MRI and MRA 05/29/2014.  FINDINGS: MRI HEAD FINDINGS  The abnormal hypodensity on CT today corresponds to Confluent 2 x 3 x 6 cm area of restricted diffusion in the right ACA territory, including some of the same territory affected by a acute on chronic ischemia on the 2015 comparison.  No contralateral or posterior fossa restricted diffusion. Major intracranial vascular flow voids are preserved except for the right ACA, see MRA details below.  Cytotoxic edema with T2 and FLAIR hyperintensity in the affected parenchyma. Underlying mild chronic encephalomalacia in the right ACA territory is stable since 2015. No acute intracranial hemorrhage  identified. No significant mass effect. No ventriculomegaly. Negative pituitary, cervicomedullary junction and visualized cervical spine. Elsewhere stable gray  and white matter signal.  Visible internal auditory structures appear normal. Stable mastoids and paranasal sinuses. Stable orbits soft tissues. Normal bone marrow signal. Visualized scalp soft tissues are within normal limits.  MRA HEAD FINDINGS  Stable antegrade flow in the posterior circulation. Stable distal vertebral arteries, PICA origins, basilar artery, SCA origins, and bilateral PCA branches.  Stable antegrade flow in both ICA siphons. More motion artifact today than on the 2015 comparison. Stable siphon irregularity compatible with moderate atherosclerosis, but without new or increased stenosis. Both carotid termini remain patent. Both MCA and ACA origins remain patent.  However, there is decreased flow signal today in the right A1 segment, and the previously seen pericallosal occlusion of the right ACA has extended proximally, such that now all the right A2 segment is occluded at or just beyond the anterior communicating artery (series 6, image 123). Left ACA flow signal is stable.  Visualized bilateral MCA branches are stable with mild to moderate irregularity indicating widespread atherosclerosis as before.  IMPRESSION: 1. Confluent right ACA infarct with cytotoxic edema but no hemorrhage or significant mass effect. 2. Associated proximal extension since 2015 of the known right ACA occlusion; the entire right A2 segment is now occluded, with poor flow in the right A1 segment. 3. Otherwise stable brain and intracranial MRA since 05/29/2014.  Electronically Signed: By: Odessa FlemingH  Hall M.D. On: 11/14/2014 16:47    Sherise Geerdes, MD  [facility] Pager:[phone] If 7PM-7AM, please contact night-coverage [email or web address] Password [email or web address] 11/17/2014, 3:39 PM   LOS: 3 days     Code Status: Full Family Communication: Son Mr. Jamelle HaringDavid  [place] Disposition Plan: SNIF am   Consultants:  Neurologist   Antibiotics:  Rocephin 11/16/2014  HPI/Subjective:  80 year old female with history of diastolic CHF, type 2 diabetes mellitus with diabetic nephropathy, chronic kidney disease stage III with baseline creatinine of 1.3, TIA with left-sided weakness in July 2015, history of CAD status post stents, lumbar stenosis with neurogenic claudications, was brought to the hospital by family after sustaining acute onset left-sided weakness Denies any complaints today.  Objective: Filed Vitals:   11/17/14 0927  BP: 162/70  Pulse: 77  Temp: 97.8 F (36.6 C)  Resp: 16    Intake/Output Summary (Last 24 hours) at 11/17/14 1538 Last data filed at 11/17/14 0830  Gross per 24 hour  Intake    600 ml  Output      0 ml  Net    600 ml   Filed Weights   11/14/14 1306  Weight: 87.8 kg (193 lb 9 oz)    Exam:   Data Reviewed: Basic Metabolic Panel:  Recent Labs Lab 11/14/14 1053 11/14/14 1100 11/15/14 1233 11/16/14 0610  NA 140 139 141 139  K 4.5 4.4 3.9 3.7  CL 105 108 109 104  CO2 22  --  22 28  GLUCOSE 185* 184* 218* 178*  BUN 59* 58* 50* 40*  CREATININE 2.74* 2.80* 2.08* 1.41*  CALCIUM 9.6  --  9.1 9.2   Liver Function Tests:  Recent Labs Lab 11/14/14 1053  AST 45*  ALT 28  ALKPHOS 43  BILITOT 0.6  PROT 6.2  ALBUMIN 3.4*   No results for input(s): LIPASE, AMYLASE in the last 168 hours. No results for input(s): AMMONIA in the last 168 hours. CBC:  Recent Labs Lab 11/14/14 1053 11/14/14 1100  WBC 8.4  --   NEUTROABS 6.3  --   HGB 12.9 13.3  HCT 38.6 39.0  MCV 88.9  --  PLT 187  --    Cardiac Enzymes: No results for input(s): CKTOTAL, CKMB, CKMBINDEX, TROPONINI in the last 168 hours. BNP (last 3 results) No results for input(s): BNP in the last 8760 hours.  ProBNP (last 3 results) No results for input(s): PROBNP in the last 8760 hours.  CBG:  Recent Labs Lab 11/16/14 1127  11/16/14 1616 11/16/14 1953 11/17/14 0652 11/17/14 1125  GLUCAP 148* 208* 214* 173* 211*    No results found for this or any previous visit (from the past 240 hour(s)).   Studies: No results found.  Scheduled Meds: . aspirin EC  81 mg Oral Daily  . atorvastatin  80 mg Oral q1800  . buPROPion  100 mg Oral Daily  . calcium-vitamin D  1 tablet Oral Daily  . cefTRIAXone (ROCEPHIN)  IV  1 g Intravenous Q24H  . clopidogrel  75 mg Oral Daily  . fenofibrate  54 mg Oral Daily  . ferrous sulfate  325 mg Oral Q breakfast  . heparin  5,000 Units Subcutaneous 3 times per day  . insulin aspart  0-15 Units Subcutaneous TID WC  . metoprolol  25 mg Oral BID  . omega-3 acid ethyl esters  1 g Oral Daily  . QUEtiapine  25 mg Oral QHS  . senna-docusate  1 tablet Oral BID   Continuous Infusions:   Principal Problem:   CVA (cerebral vascular accident) Active Problems:   Essential hypertension, benign   Dementia with behavioral disturbance   Left-sided weakness   Type 2 diabetes mellitus with renal manifestations, controlled   CKD (chronic kidney disease) stage 3, GFR 30-59 ml/min   Acute ischemic stroke   Acute renal failure superimposed on stage 3 chronic kidney disease   HLD (hyperlipidemia)     Tonya Wantz  [facility] Pager [phone]. If 7PM-7AM, please contact night-coverage at [email or web address], password [facility] 11/17/2014, 3:38 PM  LOS: 3 days

## 2014-11-17 NOTE — Progress Notes (Signed)
   11/17/14 1300  Clinical Encounter Type  Visited With Patient;Health care provider  Visit Type Initial;Spiritual support;Social support  Stress Factors  Family Stress Factors Health changes   Chaplain was referred to patient via spiritual care consult. Consult indicated patient would like to create or update an advanced directive. Chaplain visited with patient for roughly 25 minutes today. Patient had a copy of the advanced directive forms and had filled out the healthcare power of attorney form. In talking with the patient, it was pretty clear the patient has made some decisions for herself regarding life-prolonging treatment. Chaplain recommended that her and her son also go through the living will document as this is the place where those verbal decisions can be made concrete on paper. Chaplain has offered to facilitate the completion of the document once the patient and her son are able to talk about the living will portion of the document. Patient also talked about her faith in Jesus and various hard times in her life with the chaplain. Chaplain listened to these stories and affirmed the gift that faith has been for the patient. Chaplain will continue to provide emotional and spiritual support for patient. Please contact spiritual care department if patient has advanced directive ready to be completed prior to discharge.  Arisbeth Purrington, Tommi EmeryBlake R, Chaplain  1:27 PM

## 2014-11-17 NOTE — Progress Notes (Signed)
UR complete.  Allye Hoyos RN, MSN 

## 2014-11-17 NOTE — Progress Notes (Signed)
Occupational Therapy Treatment Patient Details Name: Abigail Brooks MRN: [id number] DOB: 20-Nov-1934 Today's Date: 11/17/2014    History of present illness 80 year old female with history of diastolic CHF, type 2 diabetes mellitus with diabetic nephropathy, chronic kidney disease stage III with baseline creatinine of 1.3, TIA with left-sided weakness in July 2015, history of CAD status post stents, lumbar stenosis with neurogenic claudications, was brought to the hospital by family after sustaining acute onset left-sided weakness. At baseline patient infrequently uses a walker to ambulate, lives alone and is independent of most of her ADLs. Head CT done in the ED showed acute to subacute right frontal lobe infarct in the anterior cerebral artery distribution without any hemorrhage.   OT comments  Pt with increased confusion this session compared to evaluation yesterday. Pt overall progressing towards acute OT goals but did require slightly more assist this session due to cognition and c/o dizziness during ambulation. D/c plan to SNF remains appropriate. OT to continue to follow acutely.  Follow Up Recommendations  SNF;Supervision/Assistance - 24 hour    Equipment Recommendations  Other (comment) (TBD)    Recommendations for Other Services      Precautions / Restrictions Precautions Precautions: Fall Precaution Comments: pt reports that she has fallen at least 4 times since hospitalization in January, has multiple scabs and bruises       Mobility Bed Mobility Overal bed mobility: Needs Assistance Bed Mobility: Rolling;Sit to Sidelying Rolling: Min assist       Sit to sidelying: Min assist General bed mobility comments: min A to adnvace BLE; cues for technique  Transfers Overall transfer level: Needs assistance Equipment used: Rolling walker (2 wheeled) Transfers: Sit to/from Stand Sit to Stand: Min guard         General transfer comment: cues for hand placement and to  keep rw in front; min guard for safety    Balance Overall balance assessment: Needs assistance Sitting-balance support: No upper extremity supported;Feet supported Sitting balance-Leahy Scale: Fair     Standing balance support: Bilateral upper extremity supported;During functional activity Standing balance-Leahy Scale: Fair Standing balance comment: c/o dizziness during walking; assistance needed to don briefs this session;                    ADL Overall ADL's : Needs assistance/impaired                         Toilet Transfer: Min guard;Ambulation;RW   Toileting- Clothing Manipulation and Hygiene: Minimal assistance;Sit to/from stand Toileting - Clothing Manipulation Details (indicate cue type and reason): assist for perianal care in standing position     Functional mobility during ADLs: Min guard;Rolling walker General ADL Comments: Pt on toilet with nurse aide present upon therapist arrival. Pt completed perianal care in sit<>stand with min A to complete cleaning with pt in standing position. Pt washed hands standing at sink with cues for technique with pt noted to turn away from rw 1x. Pt then attempted household ambulation at the same distance as yesterday with pt c/o dizziness halfway through and requesting to sit. Pt noted to continue to walk as therapist arranged a chair to be brought to pt.       Vision Eye Alignment: Within Functional Limits Alignment/Gaze Preference: Within Defined Limits Ocular Range of Motion: Within Functional Limits Tracking/Visual Pursuits: Able to track stimulus in all quads without difficulty Saccades: Within functional limits       Additional Comments: pt c/o blurriness in  both eyes with and without glasses; blurriness assessed with both eyes open and with each eye in isolation   Perception     Praxis      Cognition   Behavior During Therapy: Impulsive;WFL for tasks assessed/performed Overall Cognitive Status: No  family/caregiver present to determine baseline cognitive functioning Area of Impairment: Memory;Safety/judgement;Attention;Problem solving   Current Attention Level: Selective Memory: Decreased recall of precautions;Decreased short-term memory    Safety/Judgement: Decreased awareness of safety;Decreased awareness of deficits   Problem Solving: Requires verbal cues;Slow processing General Comments: Pt presents with increased cognitive deficits compared to therapist's session with pt yesterday. Pt slightly more impulsive, and STM deficits more apparent including decreased immediate recall. Pt confusing therapist with several other people with difficulty retaining information corrections. Some verbal disinhibition noted.    Extremity/Trunk Assessment               Exercises     Shoulder Instructions       General Comments      Pertinent Vitals/ Pain       Pain Assessment: Faces Faces Pain Scale: Hurts little more Pain Location: 4 at rest; 6 during stand>sit transfer and bed mobility on Lt side of trunk Pain Descriptors / Indicators: Sore Pain Intervention(s): Monitored during session  Home Living                                          Prior Functioning/Environment              Frequency Min 2X/week     Progress Toward Goals  OT Goals(current goals can now be found in the care plan section)  Progress towards OT goals: Progressing toward goals  Acute Rehab OT Goals OT Goal Formulation: With patient Time For Goal Achievement: 11/30/14 Potential to Achieve Goals: Good ADL Goals Pt Will Perform Upper Body Dressing: with modified independence;sitting Pt Will Perform Lower Body Dressing: with modified independence;sit to/from stand Pt Will Transfer to Toilet: with modified independence;ambulating Pt Will Perform Toileting - Clothing Manipulation and hygiene: with modified independence;sit to/from stand Pt Will Perform Tub/Shower Transfer: with  modified independence;shower seat;rolling walker  Plan Discharge plan remains appropriate    Co-evaluation                 End of Session Equipment Utilized During Treatment: Rolling walker   Activity Tolerance Patient tolerated treatment well;Other (comment) (dizziness during ambulation)   Patient Left with call bell/phone within reach;with bed alarm set;in bed   Nurse Communication  increased confusion compared to OT session yesterday        Time: [id number] OT Time Calculation (min): 25 min  Charges: OT General Charges $OT Visit: 1 Procedure OT Treatments $Self Care/Home Management : 23-37 mins  Pilar GrammesMathews, Caitlan Chauca H 11/17/2014, 2:39 PM

## 2014-11-17 NOTE — Progress Notes (Signed)
CARE MANAGEMENT NOTE 11/17/2014  Patient:  Abigail Brooks,Abigail Brooks   Account Number:  [id number]  Date Initiated:  11/17/2014  Documentation initiated by:  Latrica Clowers  Subjective/Objective Assessment:   Patient was admitted with CVA. Lives at home alone.     Action/Plan:   Will follow for discharge needs pending PT/OT evals and physician orders.   Anticipated DC Date:  11/18/2014   Anticipated DC Plan:  SKILLED NURSING FACILITY  In-house referral  Clinical Social Worker         Choice offered to / List presented to:             Status of service:  In process, will continue to follow Medicare Important Message given?  YES (If response is "NO", the following Medicare IM given date fields will be blank) Date Medicare IM given:  11/17/2014 Medicare IM given by:  Lawerance SabalSWIST,DEBBIE Date Additional Medicare IM given:   Additional Medicare IM given by:    Discharge Disposition:    Per UR Regulation:  Reviewed for med. necessity/level of care/duration of stay  If discussed at Long Length of Stay Meetings, dates discussed:    Comments:  11/17/14 IM letter provided. Lawerance Sabalebbie Swist RN BSN CM

## 2014-11-18 DIAGNOSIS — F329 Major depressive disorder, single episode, unspecified: Secondary | ICD-10-CM | POA: Diagnosis not present

## 2014-11-18 DIAGNOSIS — R946 Abnormal results of thyroid function studies: Secondary | ICD-10-CM | POA: Diagnosis not present

## 2014-11-18 DIAGNOSIS — I5030 Unspecified diastolic (congestive) heart failure: Secondary | ICD-10-CM | POA: Diagnosis not present

## 2014-11-18 DIAGNOSIS — E119 Type 2 diabetes mellitus without complications: Secondary | ICD-10-CM | POA: Diagnosis not present

## 2014-11-18 DIAGNOSIS — F410 Panic disorder [episodic paroxysmal anxiety] without agoraphobia: Secondary | ICD-10-CM | POA: Diagnosis not present

## 2014-11-18 DIAGNOSIS — E785 Hyperlipidemia, unspecified: Secondary | ICD-10-CM | POA: Diagnosis not present

## 2014-11-18 DIAGNOSIS — F0391 Unspecified dementia with behavioral disturbance: Secondary | ICD-10-CM | POA: Diagnosis not present

## 2014-11-18 DIAGNOSIS — R600 Localized edema: Secondary | ICD-10-CM | POA: Diagnosis not present

## 2014-11-18 DIAGNOSIS — R488 Other symbolic dysfunctions: Secondary | ICD-10-CM | POA: Diagnosis not present

## 2014-11-18 DIAGNOSIS — I6350 Cerebral infarction due to unspecified occlusion or stenosis of unspecified cerebral artery: Secondary | ICD-10-CM | POA: Diagnosis not present

## 2014-11-18 DIAGNOSIS — M6281 Muscle weakness (generalized): Secondary | ICD-10-CM | POA: Diagnosis not present

## 2014-11-18 DIAGNOSIS — I252 Old myocardial infarction: Secondary | ICD-10-CM | POA: Diagnosis not present

## 2014-11-18 DIAGNOSIS — R279 Unspecified lack of coordination: Secondary | ICD-10-CM | POA: Diagnosis not present

## 2014-11-18 DIAGNOSIS — I6789 Other cerebrovascular disease: Secondary | ICD-10-CM | POA: Diagnosis not present

## 2014-11-18 DIAGNOSIS — E568 Deficiency of other vitamins: Secondary | ICD-10-CM | POA: Diagnosis not present

## 2014-11-18 DIAGNOSIS — K5900 Constipation, unspecified: Secondary | ICD-10-CM | POA: Diagnosis not present

## 2014-11-18 DIAGNOSIS — I639 Cerebral infarction, unspecified: Secondary | ICD-10-CM | POA: Diagnosis not present

## 2014-11-18 DIAGNOSIS — N183 Chronic kidney disease, stage 3 (moderate): Secondary | ICD-10-CM | POA: Diagnosis not present

## 2014-11-18 DIAGNOSIS — I69398 Other sequelae of cerebral infarction: Secondary | ICD-10-CM | POA: Diagnosis not present

## 2014-11-18 DIAGNOSIS — M25569 Pain in unspecified knee: Secondary | ICD-10-CM | POA: Diagnosis not present

## 2014-11-18 DIAGNOSIS — R079 Chest pain, unspecified: Secondary | ICD-10-CM | POA: Diagnosis not present

## 2014-11-18 DIAGNOSIS — R21 Rash and other nonspecific skin eruption: Secondary | ICD-10-CM | POA: Diagnosis not present

## 2014-11-18 DIAGNOSIS — R269 Unspecified abnormalities of gait and mobility: Secondary | ICD-10-CM | POA: Diagnosis not present

## 2014-11-18 DIAGNOSIS — I10 Essential (primary) hypertension: Secondary | ICD-10-CM | POA: Diagnosis not present

## 2014-11-18 DIAGNOSIS — G8929 Other chronic pain: Secondary | ICD-10-CM | POA: Diagnosis not present

## 2014-11-18 DIAGNOSIS — N390 Urinary tract infection, site not specified: Secondary | ICD-10-CM | POA: Diagnosis not present

## 2014-11-18 DIAGNOSIS — N179 Acute kidney failure, unspecified: Secondary | ICD-10-CM | POA: Diagnosis not present

## 2014-11-18 DIAGNOSIS — N3281 Overactive bladder: Secondary | ICD-10-CM | POA: Diagnosis not present

## 2014-11-18 DIAGNOSIS — D509 Iron deficiency anemia, unspecified: Secondary | ICD-10-CM | POA: Diagnosis not present

## 2014-11-18 LAB — GLUCOSE, CAPILLARY
GLUCOSE-CAPILLARY: 174 mg/dL — AB (ref 70–99)
Glucose-Capillary: 175 mg/dL — ABNORMAL HIGH (ref 70–99)

## 2014-11-18 LAB — BASIC METABOLIC PANEL
Anion gap: 7 (ref 5–15)
BUN: 20 mg/dL (ref 6–23)
CHLORIDE: 108 mmol/L (ref 96–112)
CO2: 26 mmol/L (ref 19–32)
Calcium: 9.1 mg/dL (ref 8.4–10.5)
Creatinine, Ser: 1.07 mg/dL (ref 0.50–1.10)
GFR calc Af Amer: 56 mL/min — ABNORMAL LOW (ref >90)
GFR calc non Af Amer: 48 mL/min — ABNORMAL LOW (ref >90)
GLUCOSE: 175 mg/dL — AB (ref 70–99)
POTASSIUM: 3.8 mmol/L (ref 3.5–5.1)
Sodium: 141 mmol/L (ref 135–145)

## 2014-11-18 MED ORDER — QUETIAPINE FUMARATE 25 MG PO TABS: 25.0000 mg | ORAL_TABLET | Freq: Every day | ORAL | Status: DC

## 2014-11-18 NOTE — Progress Notes (Signed)
D/C orders received. Pt notified and educated on stroke prevention. IV and tele removed. [facility] Pt dressed and belongings packed. Pt to be transported to SNF via [facility].

## 2014-11-18 NOTE — Progress Notes (Signed)
Chaplain followed up for completion of healthcare power of attorney. Pt did not want to complete living will portion. Chaplain facilitated completion. Copy placed in chart. Pt is a very religious woman, and is equally concerned about her children and grandchildren. Pt spoke about the difficulty of letting her own mother go and is worried that her children may face the same difficulty with losing her when the time comes. Chaplain offered prayer with pt, prayer much appreciated.    11/18/14 1000  Clinical Encounter Type  Visited With Patient  Visit Type Follow-up;Spiritual support  Stress Factors  Patient Stress Factors Health changes;Family relationships  Advance Directives (For Healthcare)  Does patient have an advance directive? Yes  Type of Advance Directive Healthcare Power of Attorney  Copy of advanced directive(s) in chart? Yes  Abigail Brooks, Mayer MaskerCourtney F, Chaplain 11/18/2014 11:02 AM

## 2014-11-18 NOTE — Discharge Summary (Signed)
Physician Discharge Summary  Abigail Brooks [id number]:[id number] DOB: 09-26-1934 DOA: 11/14/2014  PCP: Lauree Chandler, NP  Admit date: 11/14/2014 Discharge date: 11/18/2014  Time spent: 35 minutes  Recommendations for Outpatient Follow-up:  1. PCP in 1 week  2. [facility]   Discharge Diagnoses:  Principal Problem:   CVA (cerebral vascular accident) Active Problems:   Essential hypertension, benign   Dementia with behavioral disturbance   Left-sided weakness   Type 2 diabetes mellitus with renal manifestations, controlled   CKD (chronic kidney disease) stage 3, GFR 30-59 ml/min   Acute ischemic stroke   Acute renal failure superimposed on stage 3 chronic kidney disease   HLD (hyperlipidemia)   Discharge Condition: Stable  Diet recommendation: Cardiac and diabetic diet  Filed Weights   11/14/14 1306  Weight: 87.8 kg (193 lb 9 oz)    History of present illness:  80 year old female with history of diastolic CHF, type 2 diabetes mellitus with diabetic nephropathy, chronic kidney disease stage III with baseline creatinine of 1.3, TIA with left-sided weakness in July 2015, history of CAD status post stents, lumbar stenosis with neurogenic claudications, was brought to the hospital by family after sustaining acute onset left-sided weakness  Hospital Course:  Acute CVA (cerebral vascular accident):  -Admitted with acute left-sided weakness. -Continues to have improvement very minimal left-sided weakness. -CT head on admission showed subacute to acute right frontal lobe infarct, this was confirmed on MRI brain. MRA brain showed no occlusion of right ACA. - Patient was on Plavix prior to this admission, continued on Plavix. -Echocardiogram and carotid Doppler within normal limits. - LDL at 84 and not at goal (less than 70), hemoglobin A1c 7.3. Triglycerides elevated.  -Continue statin Due to large vessel occlusion, neurology recommend dural antiplatelet for 3 months and  then plavix alone.   UTI mild -Started the patient on Rocephin -urine culture polymicrobial. No further need for any antibiotics.   Acute renal failure superimposed on stage 3 chronic kidney disease:  -Improving, Etiology likely prerenal azotemia, HCTZ and losartan. -continue IV fluids  with improvement of the kidney function from 2.80-1.07    Dyslipidemia: - LDL at 84 and not at goal, triglycerides 312. Lipitor increased to 80 mg, continue fenofibrate.  -Recheck lipid panel in 3 months   Essential hypertension, benign:  -Allow permissive hypertension. -discontinued other antihypertensive medications and currently just on metoprolol, dose reduced because of bradycardia to 25 mg twice a day with hold parameters for heart rate less than 60.   Dementia with behavioral disturbance: - Mild confusion, this morning. - Could be from sundowning - Suspect close to her usual baseline -Added Seroquel at bedtime    Type 2 diabetes mellitus with renal manifestations, controlled: - CBGs stable.  -Continue SSI. Start back on metformin  Debility Physical therapy was concentrated who recommended patient would benefit from skilled nursing facility for rehabilitation..  Follow-up with the primary care physician in one week.   Consultations:  Neurology  Discharge Exam: Filed Vitals:   11/18/14 0946  BP: 175/76  Pulse: 67  Temp: 98.1 F (36.7 C)  Resp: 18    General: Not in distress. Alert oriented to place person and time Cardiovascular: S1-S2 regular Respiratory: Bilateral clear to auscultation  Discharge Instructions   Discharge Instructions    Ambulatory referral to Neurology    Complete by:  As directed   Pt will follow up with Dr. Erlinda Hong at [facility] in about 2 months. Thanks.  Current Discharge Medication List    CONTINUE these medications which have NOT CHANGED   Details  atorvastatin (LIPITOR) 10 MG tablet Take 1 tablet (10 mg total) by mouth daily  at 6 PM. Qty: 90 tablet, Refills: 1    buPROPion (WELLBUTRIN SR) 100 MG 12 hr tablet Take 1 tablet (100 mg total) by mouth daily. Qty: 60 tablet, Refills: 1   Associated Diagnoses: Depression    Calcium Carbonate-Vitamin D (CALCIUM + D PO) Take 1 tablet by mouth daily. Calcium 315 mg,Vitamin D3 250 I.U.    cephALEXin (KEFLEX) 250 MG capsule Take 250 mg by mouth daily.    clonazePAM (KLONOPIN) 0.5 MG tablet Take one tablet by mouth twice daily for nerves Qty: 60 tablet, Refills: 0    clopidogrel (PLAVIX) 75 MG tablet Take 1 tablet (75 mg total) by mouth daily. Qty: 90 tablet, Refills: 1    diclofenac sodium (VOLTAREN) 1 % GEL Apply 4 g topically 2 (two) times daily as needed (for knee pain).    fenofibrate 54 MG tablet Take 1 tablet (54 mg total) by mouth daily. Qty: 90 tablet, Refills: 1    ferrous sulfate 325 (65 FE) MG tablet Take 1 tablet (325 mg total) by mouth daily with breakfast. Qty: 90 tablet, Refills: 1    fish oil-omega-3 fatty acids 1000 MG capsule Take 1 g by mouth daily.     hydrALAZINE (APRESOLINE) 25 MG tablet Take 1 tablet (25 mg total) by mouth 3 (three) times daily. Qty: 270 tablet, Refills: 1    ibuprofen (ADVIL,MOTRIN) 400 MG tablet Take 1 tablet (400 mg total) by mouth every 6 (six) hours as needed. Qty: 30 tablet, Refills: 0    losartan-hydrochlorothiazide (HYZAAR) 50-12.5 MG per tablet Take 1 tablet by mouth daily. Qty: 90 tablet, Refills: 1    metFORMIN (GLUCOPHAGE) 500 MG tablet Take 1 tablet (500 mg total) by mouth daily with breakfast. Qty: 90 tablet, Refills: 3    metoprolol (LOPRESSOR) 100 MG tablet TAKE 1 TABLET (100 MG TOTAL) BY MOUTH 2 (TWO) TIMES DAILY. Qty: 180 tablet, Refills: 1    nitroGLYCERIN (NITROSTAT) 0.4 MG SL tablet Place 0.4 mg under the tongue every 5 (five) minutes as needed for chest pain. Max 3 tablets in 15 minutes    nystatin-triamcinolone ointment (MYCOLOG) Apply 1 application topically 2 (two) times daily. Qty: 60 g,  Refills: 1    oxyCODONE-acetaminophen (PERCOCET/ROXICET) 5-325 MG per tablet Take one to two tablets by mouth every 8 hours as needed for severe pain Qty: 90 tablet, Refills: 0    phenazopyridine (PYRIDIUM) 100 MG tablet Take 1 tablet (100 mg total) by mouth 3 (three) times daily as needed for pain. Qty: 10 tablet, Refills: 0    polyethylene glycol (MIRALAX / GLYCOLAX) packet Take 17 g by mouth daily as needed for mild constipation or moderate constipation.     QUEtiapine (SEROQUEL) 25 MG tablet 1 by mouth in the morning and 2 by mouth in the evening Qty: 270 tablet, Refills: 1    senna-docusate (SENNA S) 8.6-50 MG per tablet Take 1 tablet by mouth 2 (two) times daily.    fesoterodine (TOVIAZ) 4 MG TB24 tablet Take 1 tablet (4 mg total) by mouth daily. Qty: 90 tablet, Refills: 1    MOVIPREP 100 G SOLR Take 1 kit (200 g total) by mouth once. Qty: 1 kit, Refills: 0       Allergies  Allergen Reactions  . Morphine And Related Other (See Comments)  Makes her crazy  . Enalapril Swelling    Facial swelling   Follow-up Information    Follow up with Xu,Jindong, MD. Schedule an appointment as soon as possible for a visit in 2 months.   Specialty:  Neurology   Why:  stroke clinic   Contact information:   [address] [place] [address] [phone]        The results of significant diagnostics from this hospitalization (including imaging, microbiology, ancillary and laboratory) are listed below for reference.    Significant Diagnostic Studies: Dg Thoracic Spine 2 View  11/09/2014   CLINICAL DATA:  80 year old female with history of trauma from a fall this morning contour back complaining of a upper back pain.  EXAM: THORACIC SPINE - 2 VIEW  COMPARISON:  No priors.  FINDINGS: Two views of the thoracic spine demonstrate no definite acute displaced fracture or compression type fracture. There is severe multilevel degenerative disc disease, with multiple flowing  anterior osteophytes throughout the thoracic spine, which may suggest diffuse idiopathic skeletal hyperostosis (DISH).  IMPRESSION: 1. No acute radiographic abnormality of the thoracic spine. 2. Multilevel degenerative disc disease in the thoracic spine. 3. Possible DISH.   Electronically Signed   By: Vinnie Langton M.D.   On: 11/09/2014 18:43   Dg Lumbar Spine Complete  11/09/2014   CLINICAL DATA:  Fall, low back pain  EXAM: LUMBAR SPINE - COMPLETE 4+ VIEW  COMPARISON:  None.  FINDINGS: Five lumbar type vertebral bodies.  Straightening of the normal lumbar lordosis.  No evidence of fracture or dislocation. Vertebral body heights are maintained.  Moderate multilevel degenerative changes, most prominent at L4-5.  Visualized bony pelvis appears intact.  IMPRESSION: No fracture or dislocation is seen.  Moderate degenerative changes.   Electronically Signed   By: Julian Hy M.D.   On: 11/09/2014 18:54   Dg Pelvis 1-2 Views  11/09/2014   CLINICAL DATA:  Fall, low back pain  EXAM: PELVIS - 1-2 VIEW  COMPARISON:  08/21/2014  FINDINGS: No fracture or dislocation is seen.  Bilateral hip joint spaces are narrowed but symmetric.  Degenerative changes of the pubic symphysis.  Moderate degenerative changes of the lower lumbar spine.  IMPRESSION: No fracture or dislocation is seen.   Electronically Signed   By: Julian Hy M.D.   On: 11/09/2014 18:55   Ct Head Wo Contrast  11/14/2014   CLINICAL DATA:  Patient with sudden onset of left upper extremity weakness. Recent fall. History of prior TIAs.  EXAM: CT HEAD WITHOUT CONTRAST  TECHNIQUE: Contiguous axial images were obtained from the base of the skull through the vertex without intravenous contrast.  COMPARISON:  Brain CT 08/21/2014  FINDINGS: Medial right frontal lobe hypodensity and loss of gray-white differentiation. No significant mass effect. No evidence for intracranial hemorrhage. Bilateral cataract surgery. Paranasal sinuses are unremarkable.  Mastoid air cells are unremarkable. Calvarium is intact.  IMPRESSION: Findings compatible with acute to subacute right frontal lobe infarct in the anterior cerebral artery distribution. No hemorrhage.  Critical Value/emergent results were called by telephone at the time of interpretation on 11/14/2014 at 11:07 am to Dr. Nicole Kindred, who verbally acknowledged these results.   Electronically Signed   By: Lovey Newcomer M.D.   On: 11/14/2014 11:10   Mr Brain Wo Contrast  11/14/2014   ADDENDUM REPORT: 11/14/2014 16:57  ADDENDUM: Study discussed by telephone with Dr. Louellen Molder on 11/14/2014 at 1655 hours.   Electronically Signed   By: Herminio Heads.D.  On: 11/14/2014 16:57   11/14/2014   CLINICAL DATA:  80 year old female with acute left side weakness. Evidence of right ACA infarct on noncontrast head CT. Initial encounter.  EXAM: MRI HEAD WITHOUT CONTRAST  MRA HEAD WITHOUT CONTRAST  TECHNIQUE: Multiplanar, multiecho pulse sequences of the brain and surrounding structures were obtained without intravenous contrast. Angiographic images of the head were obtained using MRA technique without contrast.  COMPARISON:  Head CT without contrast 1056 hours today. Brain MRI and MRA 05/29/2014.  FINDINGS: MRI HEAD FINDINGS  The abnormal hypodensity on CT today corresponds to Confluent 2 x 3 x 6 cm area of restricted diffusion in the right ACA territory, including some of the same territory affected by a acute on chronic ischemia on the 2015 comparison.  No contralateral or posterior fossa restricted diffusion. Major intracranial vascular flow voids are preserved except for the right ACA, see MRA details below.  Cytotoxic edema with T2 and FLAIR hyperintensity in the affected parenchyma. Underlying mild chronic encephalomalacia in the right ACA territory is stable since 2015. No acute intracranial hemorrhage identified. No significant mass effect. No ventriculomegaly. Negative pituitary, cervicomedullary junction and visualized  cervical spine. Elsewhere stable gray and white matter signal.  Visible internal auditory structures appear normal. Stable mastoids and paranasal sinuses. Stable orbits soft tissues. Normal bone marrow signal. Visualized scalp soft tissues are within normal limits.  MRA HEAD FINDINGS  Stable antegrade flow in the posterior circulation. Stable distal vertebral arteries, PICA origins, basilar artery, SCA origins, and bilateral PCA branches.  Stable antegrade flow in both ICA siphons. More motion artifact today than on the 2015 comparison. Stable siphon irregularity compatible with moderate atherosclerosis, but without new or increased stenosis. Both carotid termini remain patent. Both MCA and ACA origins remain patent.  However, there is decreased flow signal today in the right A1 segment, and the previously seen pericallosal occlusion of the right ACA has extended proximally, such that now all the right A2 segment is occluded at or just beyond the anterior communicating artery (series 6, image 123). Left ACA flow signal is stable.  Visualized bilateral MCA branches are stable with mild to moderate irregularity indicating widespread atherosclerosis as before.  IMPRESSION: 1. Confluent right ACA infarct with cytotoxic edema but no hemorrhage or significant mass effect. 2. Associated proximal extension since 2015 of the known right ACA occlusion; the entire right A2 segment is now occluded, with poor flow in the right A1 segment. 3. Otherwise stable brain and intracranial MRA since 05/29/2014.  Electronically Signed: By: Genevie Ann M.D. On: 11/14/2014 16:47   Mr Jodene Nam Head/brain Wo Cm  11/14/2014   ADDENDUM REPORT: 11/14/2014 16:57  ADDENDUM: Study discussed by telephone with Dr. Louellen Molder on 11/14/2014 at 1655 hours.   Electronically Signed   By: Genevie Ann M.D.   On: 11/14/2014 16:57   11/14/2014   CLINICAL DATA:  80 year old female with acute left side weakness. Evidence of right ACA infarct on noncontrast head CT.  Initial encounter.  EXAM: MRI HEAD WITHOUT CONTRAST  MRA HEAD WITHOUT CONTRAST  TECHNIQUE: Multiplanar, multiecho pulse sequences of the brain and surrounding structures were obtained without intravenous contrast. Angiographic images of the head were obtained using MRA technique without contrast.  COMPARISON:  Head CT without contrast 1056 hours today. Brain MRI and MRA 05/29/2014.  FINDINGS: MRI HEAD FINDINGS  The abnormal hypodensity on CT today corresponds to Confluent 2 x 3 x 6 cm area of restricted diffusion in the right ACA territory, including some of  the same territory affected by a acute on chronic ischemia on the 2015 comparison.  No contralateral or posterior fossa restricted diffusion. Major intracranial vascular flow voids are preserved except for the right ACA, see MRA details below.  Cytotoxic edema with T2 and FLAIR hyperintensity in the affected parenchyma. Underlying mild chronic encephalomalacia in the right ACA territory is stable since 2015. No acute intracranial hemorrhage identified. No significant mass effect. No ventriculomegaly. Negative pituitary, cervicomedullary junction and visualized cervical spine. Elsewhere stable gray and white matter signal.  Visible internal auditory structures appear normal. Stable mastoids and paranasal sinuses. Stable orbits soft tissues. Normal bone marrow signal. Visualized scalp soft tissues are within normal limits.  MRA HEAD FINDINGS  Stable antegrade flow in the posterior circulation. Stable distal vertebral arteries, PICA origins, basilar artery, SCA origins, and bilateral PCA branches.  Stable antegrade flow in both ICA siphons. More motion artifact today than on the 2015 comparison. Stable siphon irregularity compatible with moderate atherosclerosis, but without new or increased stenosis. Both carotid termini remain patent. Both MCA and ACA origins remain patent.  However, there is decreased flow signal today in the right A1 segment, and the previously  seen pericallosal occlusion of the right ACA has extended proximally, such that now all the right A2 segment is occluded at or just beyond the anterior communicating artery (series 6, image 123). Left ACA flow signal is stable.  Visualized bilateral MCA branches are stable with mild to moderate irregularity indicating widespread atherosclerosis as before.  IMPRESSION: 1. Confluent right ACA infarct with cytotoxic edema but no hemorrhage or significant mass effect. 2. Associated proximal extension since 2015 of the known right ACA occlusion; the entire right A2 segment is now occluded, with poor flow in the right A1 segment. 3. Otherwise stable brain and intracranial MRA since 05/29/2014.  Electronically Signed: By: Genevie Ann M.D. On: 11/14/2014 16:47    Microbiology: Recent Results (from the past 240 hour(s))  Urine culture     Status: None   Collection Time: 11/16/14  6:23 PM  Result Value Ref Range Status   Specimen Description URINE, RANDOM  Final   Special Requests NONE  Final   Colony Count   Final    50,000 COLONIES/ML Performed at [facility]    Culture   Final    Multiple bacterial morphotypes present, none predominant. Suggest appropriate recollection if clinically indicated. Performed at [facility]    Report Status 11/17/2014 FINAL  Final     Labs: Basic Metabolic Panel:  Recent Labs Lab 11/14/14 1053 11/14/14 1100 11/15/14 1233 11/16/14 0610 11/18/14 0613  NA 140 139 141 139 141  K 4.5 4.4 3.9 3.7 3.8  CL 105 108 109 104 108  CO2 22  --  22 28 26   GLUCOSE 185* 184* 218* 178* 175*  BUN 59* 58* 50* 40* 20  CREATININE 2.74* 2.80* 2.08* 1.41* 1.07  CALCIUM 9.6  --  9.1 9.2 9.1   Liver Function Tests:  Recent Labs Lab 11/14/14 1053  AST 45*  ALT 28  ALKPHOS 43  BILITOT 0.6  PROT 6.2  ALBUMIN 3.4*   No results for input(s): LIPASE, AMYLASE in the last 168 hours. No results for input(s): AMMONIA in the last 168 hours. CBC:  Recent Labs Lab  11/14/14 1053 11/14/14 1100  WBC 8.4  --   NEUTROABS 6.3  --   HGB 12.9 13.3  HCT 38.6 39.0  MCV 88.9  --   PLT 187  --  Cardiac Enzymes: No results for input(s): CKTOTAL, CKMB, CKMBINDEX, TROPONINI in the last 168 hours. BNP: BNP (last 3 results) No results for input(s): BNP in the last 8760 hours.  ProBNP (last 3 results) No results for input(s): PROBNP in the last 8760 hours.  CBG:  Recent Labs Lab 11/17/14 1125 11/17/14 1629 11/17/14 2210 11/18/14 0637 11/18/14 1141  GLUCAP 211* 167* 177* 174* 175*       Signed:  Sherria Riemann  [facility] 11/18/2014, 2:01 PM

## 2014-11-18 NOTE — Clinical Social Work Note (Signed)
CSW spoke with the pt and pt's son [name]. CSW informed the pt and pt's son [name] regarding the discharge today. CSW and pt discussed ambulance transport. . CSW contact [facility] at [phone] to schedule transport for the pt. CS[facility]'s [facility]. CSW upload the pt's discharge summary. Bedside RN can call reported to [phone].   Lluvia Gwynne, MSW, LCSWA [phone]

## 2014-11-18 NOTE — Progress Notes (Signed)
Speech Language Pathology Treatment: Cognitive-Linquistic  Patient Details Name: Abigail Brooks MRN: [id number] DOB: [birth year] Today's Date: 11/18/2014 Time: [id number] SLP Time Calculation (min) (ACUTE ONLY): 13 min  Assessment / Plan / Recommendation Clinical Impression  Skilled treatment session focused on addressing cognition goals.  SLP facilitated session with Mod multiodal cues to utilize external aids to assist with recall/orientation to date and situation.  Patient also required Max question cues to identify 1 cognitive and 1 physical change following CVA, but was able to state her need to use call bell for assist and demonstrated which button to push accurately.  Patient reported seeing bugs on the walls and was distracted by this throughout session, but was redirectable back to task at hand Min verbal cues (RN notified).  Continue to recommend 24/7 assist and SLP follow up at next level of care.    HPI HPI: Pt  is a 80 y.o. female with a history of previous strokes as well as hypertension, hyperlipidemia and diabetes mellitus, brought to the emergency room and code stroke status after developing weakness involving her left extremities per family and patient report. No changes in speech . She has been on Plavix daily for antiplatelet therapy. CT scan of head showed acute to subacute right frontal lobe infarction involving the ACA distribution. NIH stroke score was 6. Patient was beyond time window for treatment consideration with [facility].    Pertinent Vitals Pain Assessment: No/denies pain  SLP Plan  Continue with current plan of care    Recommendations                Oral Care Recommendations: Oral care BID Follow up Recommendations: 24 hour supervision/assistance;Home health SLP;Skilled Nursing facility Plan: Continue with current plan of care    GO    Charlane FerrettiMelissa Hannalee Castor, M.A., CCC-SLP [phone]  Regene Mccarthy 11/18/2014, 10:34 AM

## 2014-11-18 NOTE — Progress Notes (Signed)
Physical Therapy Treatment Patient Details Name: Abigail PitcherFrances L Brooks MRN: [id number] DOB: 15-Jun-1935 Today's Date: 11/18/2014    History of Present Illness 80 year old female with history of diastolic CHF, type 2 diabetes mellitus with diabetic nephropathy, chronic kidney disease stage III with baseline creatinine of 1.3, TIA with left-sided weakness in July 2015, history of CAD status post stents, lumbar stenosis with neurogenic claudications, was brought to the hospital by family after sustaining acute onset left-sided weakness. At baseline patient infrequently uses a walker to ambulate, lives alone and is independent of most of her ADLs. Head CT done in the ED showed acute to subacute right frontal lobe infarct in the anterior cerebral artery distribution without any hemorrhage.    PT Comments    Patient continues with some cognitive and safety concerns. Very pleasant and agreeable to ambulation. Required one seated rest break and mulitiple standing breaks due to fatigue. Continue to recommend SNF for ongoing Physical Therapy.     Follow Up Recommendations  SNF;Supervision/Assistance - 24 hour     Equipment Recommendations  None recommended by PT    Recommendations for Other Services       Precautions / Restrictions Precautions Precautions: Fall Precaution Comments: pt reports that she has fallen at least 4 times since hospitalization in January, has multiple scabs and bruises    Mobility  Bed Mobility               General bed mobility comments: Patient up in recliner before and after session  Transfers Overall transfer level: Needs assistance Equipment used: Rolling walker (2 wheeled)   Sit to Stand: Min guard         General transfer comment: cues for hand placement and to keep rw in front; min guard for safety  Ambulation/Gait Ambulation/Gait assistance: Min guard Ambulation Distance (Feet): 150 Feet Assistive device: Rolling walker (2 wheeled) Gait  Pattern/deviations: Step-through pattern;Decreased stride length Gait velocity: decreased   General Gait Details: Patient continues to shuffling gait and needing cues for safety. Patient stop to stand x3 due to fatigue.    Stairs            Wheelchair Mobility    Modified Rankin (Stroke Patients Only) Modified Rankin (Stroke Patients Only) Pre-Morbid Rankin Score: No significant disability Modified Rankin: Moderately severe disability     Balance                                    Cognition Arousal/Alertness: Awake/alert Behavior During Therapy: Impulsive;WFL for tasks assessed/performed Overall Cognitive Status: No family/caregiver present to determine baseline cognitive functioning Area of Impairment: Memory;Safety/judgement;Attention;Problem solving                    Exercises      General Comments        Pertinent Vitals/Pain Pain Assessment: No/denies pain    Home Living                      Prior Function            PT Goals (current goals can now be found in the care plan section) Progress towards PT goals: Progressing toward goals    Frequency  Min 3X/week    PT Plan Current plan remains appropriate    Co-evaluation             End of Session Equipment Utilized During Treatment: Gait belt  Activity Tolerance: Patient tolerated treatment well Patient left: in chair;with chair alarm set;with call bell/phone within reach     Time: 0812-0836 PT Time Calculation (min) (ACUTE ONLY): 24 min  Charges:  $Gait Training: 8-22 mins $Therapeutic Activity: 8-22 mins                    G Codes:      Fredrich Birks 11/18/2014, 2:12 PM  11/18/2014 Fredrich Birks PTA [phone] pager [phone] office

## 2014-11-19 DIAGNOSIS — E119 Type 2 diabetes mellitus without complications: Secondary | ICD-10-CM | POA: Diagnosis not present

## 2014-11-19 DIAGNOSIS — F0391 Unspecified dementia with behavioral disturbance: Secondary | ICD-10-CM | POA: Diagnosis not present

## 2014-11-19 DIAGNOSIS — I252 Old myocardial infarction: Secondary | ICD-10-CM | POA: Diagnosis not present

## 2014-11-19 DIAGNOSIS — R946 Abnormal results of thyroid function studies: Secondary | ICD-10-CM | POA: Diagnosis not present

## 2014-11-19 DIAGNOSIS — I639 Cerebral infarction, unspecified: Secondary | ICD-10-CM | POA: Diagnosis not present

## 2014-11-23 IMAGING — CT CT HEAD W/O CM
1 series · 1 of 1 positions shown · non-contrast
Comparison: CT of the head performed 04/02/2014

CLINICAL DATA: Code stroke.  Left-sided weakness.

EXAM:
CT HEAD WITHOUT CONTRAST
TECHNIQUE: Contiguous axial images were obtained from the base of the skull
through the vertex without intravenous contrast.

[Series 1: topogram 0.6 t20f · sagittal · 1.00mm/px · 1 of 1 slices shown]
[im 1/1]
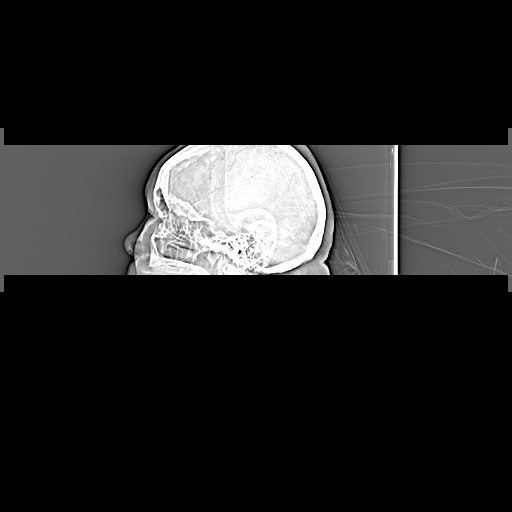

[1 of 1 positions shown; findings below may reference images not displayed]

FINDINGS: There is no evidence of acute infarction, mass lesion, or intra- or
extra-axial hemorrhage on CT.

Mild periventricular white matter change likely reflects small
vessel ischemic microangiopathy. Prominence of the ventricles and
sulci suggests mild cortical volume loss. Mild cerebellar atrophy is
noted.

The brainstem and fourth ventricle are within normal limits. The
basal ganglia are unremarkable in appearance. The cerebral
hemispheres demonstrate grossly normal gray-white differentiation.
No mass effect or midline shift is seen.

There is no evidence of fracture; visualized osseous structures are
unremarkable in appearance. The orbits are within normal limits. The
paranasal sinuses and mastoid air cells are well-aerated. No
significant soft tissue abnormalities are seen.
IMPRESSION: 1. No acute intracranial pathology seen on CT.
2. Mild cortical volume loss and scattered small vessel ischemic
microangiopathy.

These results were called by telephone at the time of interpretation
on 05/29/2014 at [DATE] to Dr. NICKORDO MILFORD , who verbally
acknowledged these results.

## 2014-11-29 DIAGNOSIS — I5030 Unspecified diastolic (congestive) heart failure: Secondary | ICD-10-CM | POA: Diagnosis not present

## 2014-11-29 DIAGNOSIS — R600 Localized edema: Secondary | ICD-10-CM | POA: Diagnosis not present

## 2014-12-03 DIAGNOSIS — R21 Rash and other nonspecific skin eruption: Secondary | ICD-10-CM | POA: Diagnosis not present

## 2014-12-07 ENCOUNTER — Ambulatory Visit: Payer: Self-pay | Admitting: Internal Medicine

## 2014-12-09 ENCOUNTER — Ambulatory Visit: Payer: Medicare Other | Admitting: Nurse Practitioner

## 2014-12-15 DIAGNOSIS — N390 Urinary tract infection, site not specified: Secondary | ICD-10-CM | POA: Diagnosis not present

## 2014-12-17 DIAGNOSIS — E119 Type 2 diabetes mellitus without complications: Secondary | ICD-10-CM | POA: Diagnosis not present

## 2014-12-22 DIAGNOSIS — N390 Urinary tract infection, site not specified: Secondary | ICD-10-CM | POA: Diagnosis not present

## 2014-12-27 ENCOUNTER — Telehealth: Payer: Self-pay | Admitting: *Deleted

## 2014-12-27 NOTE — Telephone Encounter (Signed)
I called and LMVM for pt/son relating to Dr. Marlis EdelsonSethi's schedule change.  Need to reschedule her appt.

## 2014-12-27 NOTE — Telephone Encounter (Signed)
I spoke to Abigail Brooks.  Made appt for 12-28-14 at 1600, will arrive 1530.  ([place]

## 2014-12-27 NOTE — Telephone Encounter (Signed)
Patients son Abigail Brooks([name]) returned call about rescheduling, the next available date was August and he wants her to come in before then. Please call and advise.

## 2014-12-28 ENCOUNTER — Encounter: Payer: Self-pay | Admitting: Neurology

## 2014-12-28 ENCOUNTER — Ambulatory Visit (INDEPENDENT_AMBULATORY_CARE_PROVIDER_SITE_OTHER): Payer: Medicare Other | Admitting: Neurology

## 2014-12-28 VITALS — BP 168/77 | HR 59 | Ht 58.5 in | Wt 198.2 lb

## 2014-12-28 DIAGNOSIS — I639 Cerebral infarction, unspecified: Secondary | ICD-10-CM | POA: Diagnosis not present

## 2014-12-28 DIAGNOSIS — I6330 Cerebral infarction due to thrombosis of unspecified cerebral artery: Secondary | ICD-10-CM

## 2014-12-28 NOTE — Progress Notes (Signed)
[facility] [address] [place]. [place] [id number] [phone]       OFFICE FOLLOW-UP NOTE  [name] Date of Birth:  30-Jun-1935 Medical Record Number:  [id number]   HPI: Initial Consult 04/22/2014 : [age] year Caucasian lady who had an episode of transient loss of consciousness and slurred speech and left-sided weakness on 02/21/14. Her son was talking to her on the phone and noticed that she was not responding speaking. He immediately drove to her house about 30 minutes later noticed that she was would respond to him but her speech was slurred and she had some left-sided weakness. She presented to [facility]. She was felt to have had a stroke however MRI scan of the brain revealed no acute infarct. She was also found to have Escherichia coli Unrinary tract infection and treated with antibiotics. She had a history of recurrent bladder infections with confusion and a sensation following these in the past. She also had history of multiple falls. She had history of severe lumbar spinal stenosis with neurogenic claudication and had undergone prior surgery twice. The rest of her stroke workup was unremarkable including transthoracic echo which showed ejection fraction of 55-60% without any cardiac source of embolism. Carotid Doppler showed no significant extracranial stenosis. Lipid profile is significant for elevated LDL cholesterol of 105 mg percent and elevated triglycerides for which she was started on fenofibrate and Lipitor. Hemoglobin A1c was 6.4. Patient had previous been on aspirin which which was switched to Plavix. She was in consultation by Dr. Thad Rangereynolds not by stroke team.. EEG was not done.Son is unable to tell me if she has been tried on medications like Aricept or Namenda in the past. She however has had improvement in her mental status and is back to baseline. She has no residual weakness. She was walking with a cane with multiple falls and recently she  has been switched to a walker. She is currently getting a home physical therapy were working with advocating her to use a walker correctly. She yet has had 2 falls this week already. She has no known prior history of strokes TIAs seizures. It is unclear to me whether she's had dementia workup in the past or treatment..Behavioural disturbance was documented in her chart and she was advised to stay on Seroquel.  Update 08/26/2014 : She returns for follow-up to see me after last visit 4 months ago. She is accompanied by her daughter provides history. Patient was actually seen on 06/10/14 by Dr. Lucia GaskinsAhern urgently as she complained of increasing left leg weakness and was found on MRI to have acute on chronic right ACA territory infarct occluded left ACA in the A2 segment. Widespread anterior circulation atherosclerosis, with moderate left MCA M1 and right supra clinoid ICA stenoses. She was advised to take aspirin and Plavix for 3 months and aggressive medical risk factor control. She has noticed some improvement but states that the left leg still wobbles and she can fall easily and needs a walker all the time. She has also had intermittent confusion and behavioral disturbances and memory loss which seem to be more pronounced when she has recurrent UTI. She had normal vitamin B12, TSH and RPR in March 2015. She has been diagnosed with renal stones and sees the urologist Dr. Brunilda PayorNesi. She is currently on Klonopin and cervical from her primary physician. On Mini-Mental testing today she scored 23/30 and scored 8 on the geriatric depression scale. She is not been tried on any antidepressants.  Update 12/28/2014 : She is seen today for follow-up after recent hospital admission for stroke on 11/14/14.brought to the emergency room and code stroke status after developing weakness involving her left extremities. She was last seen well at 6:30 AM today 11/14/2014. She was noted at around 10:30 AM to have weakness on her left side by  family members. Speech was unchanged and face did not appear to be and on either side. She has been on Plavix daily for antiplatelet therapy. CT scan of her head showed findings compatible with acute to subacute right frontal lobe infarction involving the ACA distribution. NIH stroke score was 6. Patient was beyond time window for treatment consideration with TPA in addition to having recent stroke demonstrated on CT scan. She was admitted for further evaluation and treatment.Ct Head Wo  11/14/2014 Findings compatible with acute to subacute right frontal lobe infarct in the anterior cerebral artery distribution. No hemorrhage.Mri & Mra Head/brain Wo Cm 11/14/2014 1. Confluent right ACA infarct with cytotoxic edema but no hemorrhage or significant mass effect. 2. Associated proximal extension since 2015 of the known right ACA occlusion; the entire right A2 segment is now occluded, with poor flow in the right A1 segment. 3. Otherwise stable brain and intracranial MRA since 05/29/2014. Carotid Doppler There is 1-39% bilateral ICA stenosis. Vertebral artery flow is antegrade. 2D echo - - Left ventricle: The cavity size was normal. Wall thickness was increased in a pattern of mild LVH. Systolic function was vigorous. The estimated ejection fraction was in the range of 65%to 70%. Wall motion was normal; there were no regional wallmotion abnormalities. She was seen by physical and occupational therapy and felt unsafe to go home and hence was transferred for rehabilitation to skilled nursing facility. She has finished physical and occupational therapy and is now moved to the long-term skilled nursing section. She can ambulate with a walker short distances but uses a wheelchair for long distances. She has had mild cognitive difficulties as well. She still complains of mild weakness in the left side. She is tolerating Plavix well without bleeding or bruising. Her blood pressure is under good control though it  is elevated in office today at 168/77. She has been started on sliding scale insulin for better glucose control. ROS:   14 system review of systems is positive for gait difficulty, imbalance, incontinence of bladder, frequency of urination and all other systems negative  PMH:  Past Medical History  Diagnosis Date  . Coronary artery disease     stents x 2, sees Dr. Halina Andreas practice  . Congestive heart failure     2012  . Myocardial infarction     "unsure of when"  . Hypertension   . Diabetes mellitus   . Shortness of breath   . Anemia   . Blood transfusion     when had hysterectomy  . Hypothyroidism     has had hx of treatment  . Urinary frequency   . Urinary tract infection     hx of  . GERD (gastroesophageal reflux disease)   . H/O hiatal hernia   . Arthritis   . Depression   . Spinal stenosis     with bone spurs and ruptured disc  . Cataract   . Blood transfusion without reported diagnosis   . Complications affecting other specified body systems, hypertension   . Other malaise and fatigue   . Dysuria   . Unspecified vitamin D deficiency   . Anxiety state, unspecified   .  Other B-complex deficiencies   . Abnormality of gait   . Other and unspecified hyperlipidemia   . Stroke     Social History:  History   Social History  . Marital Status: Divorced    Spouse Name: N/A  . Number of Children: 3  . Years of Education: 8th   Occupational History  . retired Other   Social History Main Topics  . Smoking status: Former Smoker    Types: Cigarettes  . Smokeless tobacco: Current User    Types: Snuff     Comment: 40 years ago  . Alcohol Use: No  . Drug Use: No  . Sexual Activity: No   Other Topics Concern  . Not on file   Social History Narrative   Patient lives at home alone.   Patient is right handed.   Caffeine use: none    Medications:   Current Outpatient Prescriptions on File Prior to Visit  Medication Sig Dispense Refill  . atorvastatin  (LIPITOR) 10 MG tablet Take 1 tablet (10 mg total) by mouth daily at 6 PM. 90 tablet 1  . buPROPion (WELLBUTRIN SR) 100 MG 12 hr tablet Take 1 tablet (100 mg total) by mouth daily. 60 tablet 1  . Calcium Carbonate-Vitamin D (CALCIUM + D PO) Take 1 tablet by mouth daily. Calcium 315 mg,Vitamin D3 250 I.U.    . cephALEXin (KEFLEX) 250 MG capsule Take 250 mg by mouth daily.    . clopidogrel (PLAVIX) 75 MG tablet Take 1 tablet (75 mg total) by mouth daily. 90 tablet 1  . diclofenac sodium (VOLTAREN) 1 % GEL Apply 4 g topically 2 (two) times daily as needed (for knee pain).    . fenofibrate 54 MG tablet Take 1 tablet (54 mg total) by mouth daily. 90 tablet 1  . ferrous sulfate 325 (65 FE) MG tablet Take 1 tablet (325 mg total) by mouth daily with breakfast. 90 tablet 1  . fish oil-omega-3 fatty acids 1000 MG capsule Take 1 g by mouth daily.     . metFORMIN (GLUCOPHAGE) 500 MG tablet Take 1 tablet (500 mg total) by mouth daily with breakfast. 90 tablet 3  . metoprolol (LOPRESSOR) 100 MG tablet TAKE 1 TABLET (100 MG TOTAL) BY MOUTH 2 (TWO) TIMES DAILY. 180 tablet 1  . nitroGLYCERIN (NITROSTAT) 0.4 MG SL tablet Place 0.4 mg under the tongue every 5 (five) minutes as needed for chest pain. Max 3 tablets in 15 minutes    . nystatin-triamcinolone ointment (MYCOLOG) Apply 1 application topically 2 (two) times daily. 60 g 1  . polyethylene glycol (MIRALAX / GLYCOLAX) packet Take 17 g by mouth daily as needed for mild constipation or moderate constipation.     . QUEtiapine (SEROQUEL) 25 MG tablet Take 1 tablet (25 mg total) by mouth at bedtime. 20 tablet 1  . senna-docusate (SENNA S) 8.6-50 MG per tablet Take 1 tablet by mouth 2 (two) times daily.     No current facility-administered medications on file prior to visit.    Allergies:   Allergies  Allergen Reactions  . Morphine And Related Other (See Comments)    Makes her crazy  . Enalapril Swelling    Facial swelling    Physical Exam General: Obese  elderly Caucasian lady, seated, in no evident distress Head: head normocephalic and atraumatic.  Neck: supple with no carotid or supraclavicular bruits Cardiovascular: regular rate and rhythm, no murmurs Musculoskeletal: no deformity Skin:  no rash/petichiae Vascular:  Normal pulses all extremities Filed  Vitals:   12/28/14 1635  BP: 168/77  Pulse: 59   Neurologic Exam Mental Status: Awake and fully alert. Oriented to place and time. Recent and remote memory slightly diminished. Attention span, concentration and fund of knowledge diminished. Mood and affect appropriate. Decreased recall 2/3. Animal naming test 8 only. Cranial Nerves: Fundoscopic exam reveals sharp disc margins. Pupils equal, briskly reactive to light. Extraocular movements full without nystagmus. Visual fields full to confrontation. Hearing diminished bilaterally. Facial sensation intact. Mild left nasolabial fold asymmetry. Tongue, palate moves normally and symmetrically.  Motor: Normal bulk and tone. Normal strength in all tested extremity muscles. Diminished fine finger movements on the left. Mild left grip weakness. Orbits right over left approximately. Mild left hip flexor and ankle dorsiflexor weakness. Sensory.: intact to touch ,pinprick .position and vibratory sensation.  Coordination: Rapid alternating movements normal in all extremities. Finger-to-nose and heel-to-shin performed accurately bilaterally. Gait and Station: Arises from chair without difficulty. Gait is slightly broad-based and imbalance but can walk unassisted short distances.  Reflexes: 1+ and symmetric. Toes downgoing.   NIHSS  1 Modified Rankin  2  ASSESSMENT: [age] year Caucasian lady with right anterior cerebral artery infarct in March 2016 secondary to right ACA occlusion with vascular risk factors of hypertension, hyperlipidemia, diabetes, obesity , CAD and intracranial atherosclerosis.    PLAN: I had a long d/w patient and son about her recent  stroke,  and discussed risk for recurrent stroke/TIAs, personally independently reviewed imaging studies and stroke evaluation results and answered questions.Continue Plavix  for secondary stroke prevention and maintain strict control of hypertension with blood pressure goal below 130/90, diabetes with hemoglobin A1c goal below 6.5% and lipids with LDL cholesterol goal below 100 mg/dL. I also advised the patient to to use a walker at all times to avoid falls and injuries. Followup in the future with Butch PennyMegan Millikan, NP in 3 months or call earlier if necessary.  Delia HeadyPramod Rhyli Depaula, MD   Note: This document was prepared with digital dictation and possible smart phrase technology. Any transcriptional errors that result from this process are unintentional

## 2014-12-28 NOTE — Patient Instructions (Signed)
I had a long d/w patient and son about her recent stroke, risk for recurrent stroke/TIAs, personally independently reviewed imaging studies and stroke evaluation results and answered questions.Continue Plavix  for secondary stroke prevention and maintain strict control of hypertension with blood pressure goal below 130/90, diabetes with hemoglobin A1c goal below 6.5% and lipids with LDL cholesterol goal below 100 mg/dL. I also advised the patient to to use a walker at all times to avoid falls and injuries. Followup in the future with Butch PennyMegan Millikan, NP in 3 months or call earlier if necessary. Stroke Prevention Some medical conditions and behaviors are associated with an increased chance of having a stroke. You may prevent a stroke by making healthy choices and managing medical conditions. HOW CAN I REDUCE MY RISK OF HAVING A STROKE?   Stay physically active. Get at least 30 minutes of activity on most or all days.  Do not smoke. It may also be helpful to avoid exposure to secondhand smoke.  Limit alcohol use. Moderate alcohol use is considered to be:  No more than 2 drinks per day for men.  No more than 1 drink per day for nonpregnant women.  Eat healthy foods. This involves:  Eating 5 or more servings of fruits and vegetables a day.  Making dietary changes that address high blood pressure (hypertension), high cholesterol, diabetes, or obesity.  Manage your cholesterol levels.  Making food choices that are high in fiber and low in saturated fat, trans fat, and cholesterol may control cholesterol levels.  Take any prescribed medicines to control cholesterol as directed by your health care provider.  Manage your diabetes.  Controlling your carbohydrate and sugar intake is recommended to manage diabetes.  Take any prescribed medicines to control diabetes as directed by your health care provider.  Control your hypertension.  Making food choices that are low in salt (sodium), saturated  fat, trans fat, and cholesterol is recommended to manage hypertension.  Take any prescribed medicines to control hypertension as directed by your health care provider.  Maintain a healthy weight.  Reducing calorie intake and making food choices that are low in sodium, saturated fat, trans fat, and cholesterol are recommended to manage weight.  Stop drug abuse.  Avoid taking birth control pills.  Talk to your health care provider about the risks of taking birth control pills if you are over 78 years old, smoke, get migraines, or have ever had a blood clot.  Get evaluated for sleep disorders (sleep apnea).  Talk to your health care provider about getting a sleep evaluation if you snore a lot or have excessive sleepiness.  Take medicines only as directed by your health care provider.  For some people, aspirin or blood thinners (anticoagulants) are helpful in reducing the risk of forming abnormal blood clots that can lead to stroke. If you have the irregular heart rhythm of atrial fibrillation, you should be on a blood thinner unless there is a good reason you cannot take them.  Understand all your medicine instructions.  Make sure that other conditions (such as anemia or atherosclerosis) are addressed. SEEK IMMEDIATE MEDICAL CARE IF:   You have sudden weakness or numbness of the face, arm, or leg, especially on one side of the body.  Your face or eyelid droops to one side.  You have sudden confusion.  You have trouble speaking (aphasia) or understanding.  You have sudden trouble seeing in one or both eyes.  You have sudden trouble walking.  You have dizziness.  You  have a loss of balance or coordination.  You have a sudden, severe headache with no known cause.  You have new chest pain or an irregular heartbeat. Any of these symptoms may represent a serious problem that is an emergency. Do not wait to see if the symptoms will go away. Get medical help at once. Call your  local emergency services (911 in [place] Do not drive yourself to the hospital. Document Released: 09/13/2004 Document Revised: 12/21/2013 Document Reviewed: 02/06/2013 [facility] Patient Information 2015 [place]. This information is not intended to replace advice given to you by your health care provider. Make sure you discuss any questions you have with your health care provider.

## 2014-12-29 ENCOUNTER — Ambulatory Visit: Payer: Medicare Other | Admitting: Neurology

## 2014-12-30 ENCOUNTER — Telehealth: Payer: Self-pay

## 2014-12-30 NOTE — Telephone Encounter (Signed)
Spoke with patients son, patient is no longer under the care of doctors at [facility]. Patient is in nursing home [facility]   I informed Shanda BumpsJessica verbally.

## 2015-01-12 DIAGNOSIS — N390 Urinary tract infection, site not specified: Secondary | ICD-10-CM | POA: Diagnosis not present

## 2015-01-14 DIAGNOSIS — N390 Urinary tract infection, site not specified: Secondary | ICD-10-CM | POA: Diagnosis not present

## 2015-01-14 DIAGNOSIS — E139 Other specified diabetes mellitus without complications: Secondary | ICD-10-CM | POA: Diagnosis not present

## 2015-01-18 DIAGNOSIS — I2781 Cor pulmonale (chronic): Secondary | ICD-10-CM | POA: Diagnosis not present

## 2015-01-18 DIAGNOSIS — N390 Urinary tract infection, site not specified: Secondary | ICD-10-CM | POA: Diagnosis not present

## 2015-02-03 ENCOUNTER — Other Ambulatory Visit: Payer: Self-pay | Admitting: Internal Medicine

## 2015-02-15 IMAGING — CR DG CHEST 1V PORT
1 series · 1 of 1 positions shown · non-contrast
Comparison: 05/30/2014

CLINICAL DATA: Pt had LOC today with weakness. No c/o chest pain or
SOB. Hx stroke

EXAM:
PORTABLE CHEST - 1 VIEW

[AP]
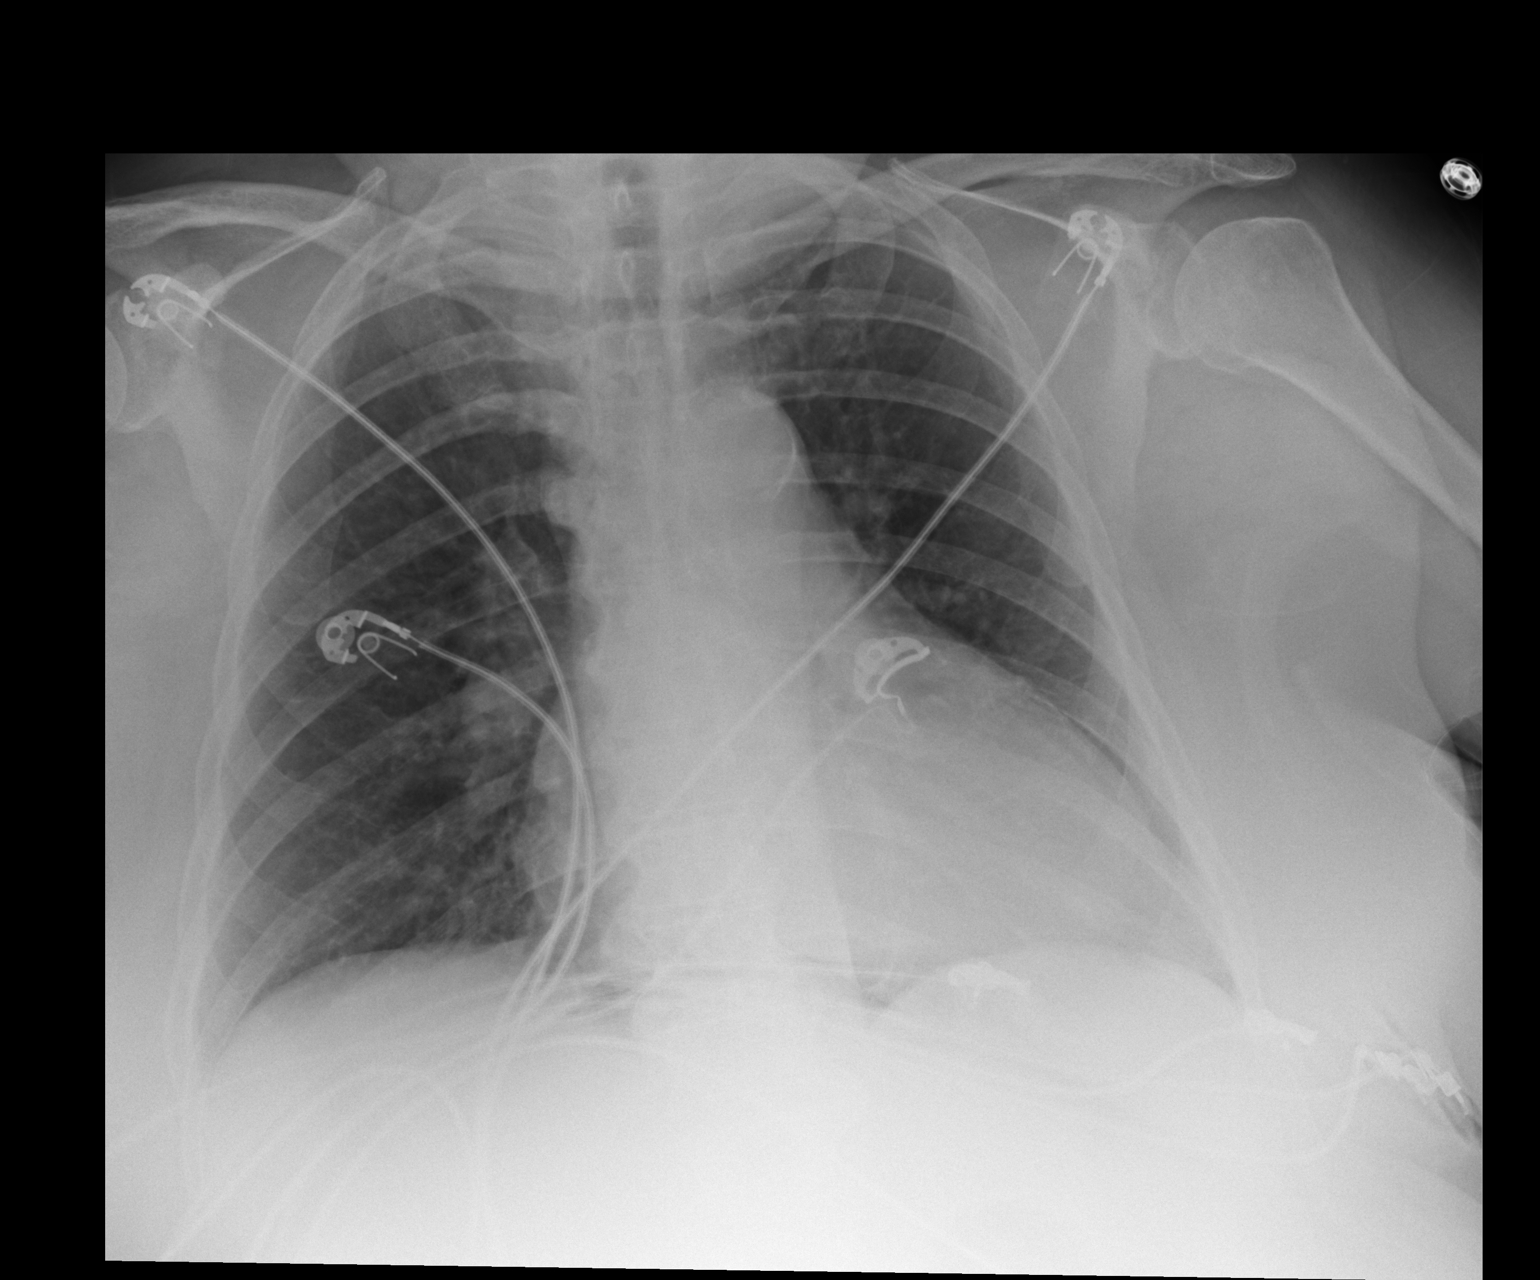

[1 of 1 positions shown; findings below may reference images not displayed]

FINDINGS: Stable mild cardiomegaly.  Atheromatous aorta.  Lungs are clear.
No effusion.
Visualized skeletal structures are unremarkable.
IMPRESSION: 1. Stable cardiomegaly.  No acute disease.

## 2015-03-14 DIAGNOSIS — N390 Urinary tract infection, site not specified: Secondary | ICD-10-CM | POA: Diagnosis not present

## 2015-03-14 DIAGNOSIS — E119 Type 2 diabetes mellitus without complications: Secondary | ICD-10-CM | POA: Diagnosis not present

## 2015-03-14 DIAGNOSIS — F0390 Unspecified dementia without behavioral disturbance: Secondary | ICD-10-CM | POA: Diagnosis not present

## 2015-03-14 DIAGNOSIS — I252 Old myocardial infarction: Secondary | ICD-10-CM | POA: Diagnosis not present

## 2015-03-18 DIAGNOSIS — M65342 Trigger finger, left ring finger: Secondary | ICD-10-CM | POA: Diagnosis not present

## 2015-03-31 DIAGNOSIS — R079 Chest pain, unspecified: Secondary | ICD-10-CM | POA: Diagnosis not present

## 2015-03-31 DIAGNOSIS — R0781 Pleurodynia: Secondary | ICD-10-CM | POA: Diagnosis not present

## 2015-03-31 DIAGNOSIS — R109 Unspecified abdominal pain: Secondary | ICD-10-CM | POA: Diagnosis not present

## 2015-04-02 DIAGNOSIS — R109 Unspecified abdominal pain: Secondary | ICD-10-CM | POA: Diagnosis not present

## 2015-04-02 DIAGNOSIS — N200 Calculus of kidney: Secondary | ICD-10-CM | POA: Diagnosis not present

## 2015-04-03 DIAGNOSIS — Z79899 Other long term (current) drug therapy: Secondary | ICD-10-CM | POA: Diagnosis not present

## 2015-04-04 DIAGNOSIS — R160 Hepatomegaly, not elsewhere classified: Secondary | ICD-10-CM | POA: Diagnosis not present

## 2015-04-04 DIAGNOSIS — R109 Unspecified abdominal pain: Secondary | ICD-10-CM | POA: Diagnosis not present

## 2015-04-04 DIAGNOSIS — N390 Urinary tract infection, site not specified: Secondary | ICD-10-CM | POA: Diagnosis not present

## 2015-04-04 DIAGNOSIS — K760 Fatty (change of) liver, not elsewhere classified: Secondary | ICD-10-CM | POA: Diagnosis not present

## 2015-04-07 ENCOUNTER — Ambulatory Visit: Payer: Medicare Other | Admitting: Adult Health

## 2015-04-11 ENCOUNTER — Encounter: Payer: Self-pay | Admitting: Adult Health

## 2015-04-11 ENCOUNTER — Ambulatory Visit (INDEPENDENT_AMBULATORY_CARE_PROVIDER_SITE_OTHER): Payer: Medicare Other | Admitting: Adult Health

## 2015-04-11 VITALS — BP 150/87 | HR 82 | Ht 59.0 in | Wt 200.0 lb

## 2015-04-11 DIAGNOSIS — Z8673 Personal history of transient ischemic attack (TIA), and cerebral infarction without residual deficits: Secondary | ICD-10-CM

## 2015-04-11 DIAGNOSIS — I639 Cerebral infarction, unspecified: Secondary | ICD-10-CM

## 2015-04-11 DIAGNOSIS — R413 Other amnesia: Secondary | ICD-10-CM

## 2015-04-11 NOTE — Patient Instructions (Signed)
Continue Plavix for secondary stroke prevention and maintain strict control of hypertension with blood pressure goal below 130/90, diabetes with hemoglobin A1c goal below 6.5% and lipids with LDL cholesterol goal below 100 mg/dL.

## 2015-04-11 NOTE — Progress Notes (Addendum)
Abigail Brooks: Abigail Brooks DOB: 01-25-1935  REASON FOR VISIT: follow up- stroke HISTORY FROM: Abigail Brooks  HISTORY OF PRESENT ILLNESS: Abigail Brooks is a 80 year old female with a history of stroke. She returns today for follow-up. The Abigail Brooks is currently taking Plavix and tolerating it well. She reports no significant bruising or bleeding. The Abigail Brooks's blood pressure has been controlled although today is slightly elevated. The Abigail Brooks's primary care has been managing her hypertension, hyperlipidemia and diabetes. She states that they just recently had lab work last week at the nursing home. The Abigail Brooks primarily uses a wheelchair or walker when ambulating. She states that she can use a walker for shorter distances and uses the wheelchair for longer distances. She states that she continues to have left-sided weakness although it has improved. Abigail Brooks's son reports that since she has been at [facility] she has not had any falls. He states that prior she was falling at least once a week. He also states that the Abigail Brooks has recurrent UTIs. She is currently on antibiotics. He denies any significant change in her memory. Abigail Brooks agrees with this. She denies any new neurological symptoms. She returns today for an evaluation.  HISTORY : Initial Consult 04/22/2014 : 73 year Caucasian lady who had an episode of transient loss of consciousness and slurred speech and left-sided weakness on 02/21/14. Her son was talking to her on the phone and noticed that she was not responding speaking. He immediately drove to her house about 30 minutes later noticed that she was would respond to him but her speech was slurred and she had some left-sided weakness. She presented to [facility]. She was felt to have had a stroke however MRI scan of the brain revealed no acute infarct. She was also found to have Escherichia coli Unrinary tract infection and treated with antibiotics. She had a history of recurrent bladder infections  with confusion and a sensation following these in the past. She also had history of multiple falls. She had history of severe lumbar spinal stenosis with neurogenic claudication and had undergone prior surgery twice. The rest of her stroke workup was unremarkable including transthoracic echo which showed ejection fraction of 55-60% without any cardiac source of embolism. Carotid Doppler showed no significant extracranial stenosis. Lipid profile is significant for elevated LDL cholesterol of 105 mg percent and elevated triglycerides for which she was started on fenofibrate and Lipitor. Hemoglobin A1c was 6.4. Abigail Brooks had previous been on aspirin which which was switched to Plavix. She was in consultation by Dr. Thad Ranger not by stroke team.. EEG was not done.Son is unable to tell me if she has been tried on medications like Aricept or Namenda in the past. She however has had improvement in her mental status and is back to baseline. She has no residual weakness. She was walking with a cane with multiple falls and recently she has been switched to a walker. She is currently getting a home physical therapy were working with advocating her to use a walker correctly. She yet has had 2 falls this week already. She has no known prior history of strokes TIAs seizures. It is unclear to me whether she's had dementia workup in the past or treatment..Behavioural disturbance was documented in her chart and she was advised to stay on Seroquel.  Update 08/26/2014 : She returns for follow-up to see me after last visit 4 months ago. She is accompanied by her daughter provides history. Abigail Brooks was actually seen on 06/10/14 by Dr. Lucia Gaskins urgently  as she complained of increasing left leg weakness and was found on MRI to have acute on chronic right ACA territory infarct occluded left ACA in the A2 segment. Widespread anterior circulation atherosclerosis, with moderate left MCA M1 and right supra clinoid ICA stenoses. She was advised  to take aspirin and Plavix for 3 months and aggressive medical risk factor control. She has noticed some improvement but states that the left leg still wobbles and she can fall easily and needs a walker all the time. She has also had intermittent confusion and behavioral disturbances and memory loss which seem to be more pronounced when she has recurrent UTI. She had normal vitamin B12, TSH and RPR in March 2015. She has been diagnosed with renal stones and sees the urologist Dr. Brunilda Payor. She is currently on Klonopin and cervical from her primary physician. On Mini-Mental testing today she scored 23/30 and scored 8 on the geriatric depression scale. She is not been tried on any antidepressants. Update 12/28/2014 : She is seen today for follow-up after recent hospital admission for stroke on 11/14/14.brought to the emergency room and code stroke status after developing weakness involving her left extremities. She was last seen well at 6:30 AM today 11/14/2014. She was noted at around 10:30 AM to have weakness on her left side by family members. Speech was unchanged and face did not appear to be and on either side. She has been on Plavix daily for antiplatelet therapy. CT scan of her head showed findings compatible with acute to subacute right frontal lobe infarction involving the ACA distribution. NIH stroke score was 6. Abigail Brooks was beyond time window for treatment consideration with TPA in addition to having recent stroke demonstrated on CT scan. She was admitted for further evaluation and treatment.Ct Head Wo 11/14/2014 Findings compatible with acute to subacute right frontal lobe infarct in the anterior cerebral artery distribution. No hemorrhage.Mri & Mra Head/brain Wo Cm 11/14/2014 1. Confluent right ACA infarct with cytotoxic edema but no hemorrhage or significant mass effect. 2. Associated proximal extension since 2015 of the known right ACA occlusion; the entire right A2 segment is now occluded, with poor  flow in the right A1 segment. 3. Otherwise stable brain and intracranial MRA since 05/29/2014. Carotid Doppler There is 1-39% bilateral ICA stenosis. Vertebral artery flow is antegrade. 2D echo - - Left ventricle: The cavity size was normal. Wall thickness was increased in a pattern of mild LVH. Systolic function was vigorous. The estimated ejection fraction was in the range of 65%to 70%. Wall motion was normal; there were no regional wallmotion abnormalities. She was seen by physical and occupational therapy and felt unsafe to go home and hence was transferred for rehabilitation to skilled nursing facility. She has finished physical and occupational therapy and is now moved to the long-term skilled nursing section. She can ambulate with a walker short distances but uses a wheelchair for long distances. She has had mild cognitive difficulties as well. She still complains of mild weakness in the left side. She is tolerating Plavix well without bleeding or bruising. Her blood pressure is under good control though it is elevated in office today at 168/77. She has been started on sliding scale insulin for better glucose control.  REVIEW OF SYSTEMS: Out of a complete 14 system review of symptoms, the Abigail Brooks complains only of the following symptoms, and all other reviewed systems are negative.  Incontinence of bladder, weakness  ALLERGIES: Allergies  Allergen Reactions  . Morphine And Related Other (See Comments)  Makes her crazy  . Enalapril Swelling    Facial swelling    HOME MEDICATIONS: Outpatient Prescriptions Prior to Visit  Medication Sig Dispense Refill  . atorvastatin (LIPITOR) 10 MG tablet TAKE 1 TABLET BY MOUTH EVERY DAY AT 6PM 90 tablet 1  . buPROPion (WELLBUTRIN SR) 100 MG 12 hr tablet Take 1 tablet (100 mg total) by mouth daily. 60 tablet 1  . Calcium Carbonate-Vitamin D (CALCIUM + D PO) Take 1 tablet by mouth daily. Calcium 315 mg,Vitamin D3 250 I.U.    . cephALEXin  (KEFLEX) 250 MG capsule Take 250 mg by mouth daily.    . clonazePAM (KLONOPIN) 0.5 MG tablet     . clopidogrel (PLAVIX) 75 MG tablet TAKE 1 TABLET BY MOUTH EVERY DAY 90 tablet 1  . diclofenac sodium (VOLTAREN) 1 % GEL Apply 4 g topically 2 (two) times daily as needed (for knee pain).    . fenofibrate 54 MG tablet Take 1 tablet (54 mg total) by mouth daily. 90 tablet 1  . ferrous sulfate 325 (65 FE) MG tablet TAKE 1 TABLET BY MOUTH EVERY MORNING WITH BREAKFAST 90 tablet 1  . fish oil-omega-3 fatty acids 1000 MG capsule Take 1 g by mouth daily.     Marland Kitchen losartan-hydrochlorothiazide (HYZAAR) 50-12.5 MG per tablet Take 1 tablet by mouth daily.  1  . metFORMIN (GLUCOPHAGE) 500 MG tablet Take 1 tablet (500 mg total) by mouth daily with breakfast. 90 tablet 3  . metoprolol (LOPRESSOR) 100 MG tablet TAKE 1 TABLET BY MOUTH TWICE A DAY 180 tablet 1  . nitroGLYCERIN (NITROSTAT) 0.4 MG SL tablet Place 0.4 mg under the tongue every 5 (five) minutes as needed for chest pain. Max 3 tablets in 15 minutes    . nystatin-triamcinolone ointment (MYCOLOG) Apply 1 application topically 2 (two) times daily. 60 g 1  . oxyCODONE-acetaminophen (PERCOCET/ROXICET) 5-325 MG per tablet   0  . polyethylene glycol (MIRALAX / GLYCOLAX) packet Take 17 g by mouth daily as needed for mild constipation or moderate constipation.     . QUEtiapine (SEROQUEL) 25 MG tablet Take 1 tablet (25 mg total) by mouth at bedtime. 20 tablet 1  . QUEtiapine (SEROQUEL) 25 MG tablet TAKE 1 TABLET BY MOUTH EVERY MORNING & 2 TABLETS IN THE EVENING 270 tablet 1  . senna-docusate (SENNA S) 8.6-50 MG per tablet Take 1 tablet by mouth 2 (two) times daily.     No facility-administered medications prior to visit.    PAST MEDICAL HISTORY: Past Medical History  Diagnosis Date  . Coronary artery disease     stents x 2, sees Dr. Halina Andreas practice  . Congestive heart failure     2012  . Myocardial infarction     "unsure of when"  . Hypertension   .  Diabetes mellitus   . Shortness of breath   . Anemia   . Blood transfusion     when had hysterectomy  . Hypothyroidism     has had hx of treatment  . Urinary frequency   . Urinary tract infection     hx of  . GERD (gastroesophageal reflux disease)   . H/O hiatal hernia   . Arthritis   . Depression   . Spinal stenosis     with bone spurs and ruptured disc  . Cataract   . Blood transfusion without reported diagnosis   . Complications affecting other specified body systems, hypertension   . Other malaise and fatigue   . Dysuria   .  Unspecified vitamin D deficiency   . Anxiety state, unspecified   . Other B-complex deficiencies   . Abnormality of gait   . Other and unspecified hyperlipidemia   . Stroke     PAST SURGICAL HISTORY: Past Surgical History  Procedure Laterality Date  . Cataract extraction w/ intraocular lens  implant, bilateral    . Bladder suspension    . Coronary      stents x 2, stent implant placed9/21/06  . Back surgery      ruptured disc, lumbar area  . Appendectomy    . Cardiac catheterization    . Lumbar laminectomy/decompression microdiscectomy  12/21/2011    Procedure: LUMBAR LAMINECTOMY/DECOMPRESSION MICRODISCECTOMY 1 LEVEL;  Surgeon: Temple Pacini, MD;  Location: [facility];  Service: Neurosurgery;  Laterality: Bilateral;  Lumbar four-five decompressive lumbar laminectomy; Right Lumbar four-five microdiscectomy  . Eye surgery    . Spine surgery    . Abdominal hysterectomy  1986    FAMILY HISTORY: Family History  Problem Relation Age of Onset  . Anesthesia problems Sister   . Anesthesia problems Son   . Thyroid cancer Son   . Heart disease Mother   . Diabetes Sister     SOCIAL HISTORY: Social History   Social History  . Marital Status: Divorced    Spouse Name: N/A  . Number of Children: 3  . Years of Education: 8th   Occupational History  . retired Other   Social History Main Topics  . Smoking status: Former Smoker    Types:  Cigarettes  . Smokeless tobacco: Current User    Types: Snuff     Comment: 40 years ago  . Alcohol Use: No  . Drug Use: No  . Sexual Activity: No   Other Topics Concern  . Not on file   Social History Narrative   Abigail Brooks lives at home alone.   Abigail Brooks is right handed.   Caffeine use: none      PHYSICAL EXAM  Filed Vitals:   04/11/15 1557  Height: 4\' 11"  (1.499 m)  Weight: 200 lb (90.719 kg)  ... Body mass index is 40.37 kg/(m^2).  MMSE - Mini Mental State Exam 08/26/2014 04/22/2014 10/21/2013 02/17/2013  Orientation to time 4 3 4 5   Orientation to Place 3 2 5 5   Registration 3 3 3 3   Attention/ Calculation 2 1 5 1   Recall 3 1 1 3   Language- name 2 objects 2 2 2 2   Language- repeat 1 1 1 1   Language- follow 3 step command 3 2 3 3   Language- read & follow direction 1 1 1 1   Write a sentence 1 1 1 1   Copy design 0 0 0 0  Total score 23 17 26 25      Generalized: Well developed, in no acute distress   Neurological examination  Mentation: Alert oriented to time, place, history taking. Follows all commands speech and language fluent. MMSE 23/30 Cranial nerve II-XII: Pupils were equal round reactive to light. Extraocular movements were full, visual field were full on confrontational test. Facial sensation and strength were normal. Uvula tongue midline. Head turning and shoulder shrug  were normal and symmetric. Motor: The motor testing reveals 5 over 5 strength of all 4 extremities. Good symmetric motor tone is noted throughout.  Sensory: Sensory testing is intact to soft touch on all 4 extremities. No evidence of extinction is noted.  Coordination: Cerebellar testing reveals good finger-nose-finger and heel-to-shin bilaterally.  Gait and station: Abigail Brooks is in  a wheelchair, she did not bring her walker with her.   DIAGNOSTIC DATA (LABS, IMAGING, TESTING) - I reviewed Abigail Brooks records, labs, notes, testing and imaging myself where available.  Lab Results  Component Value Date    WBC 8.4 11/14/2014   HGB 13.3 11/14/2014   HCT 39.0 11/14/2014   MCV 88.9 11/14/2014   PLT 187 11/14/2014      Component Value Date/Time   NA 141 11/18/2014 0613   NA 140 08/10/2014 1728   K 3.8 11/18/2014 0613   CL 108 11/18/2014 0613   CO2 26 11/18/2014 0613   GLUCOSE 175* 11/18/2014 0613   GLUCOSE 119* 08/10/2014 1728   BUN 20 11/18/2014 0613   BUN 23 08/10/2014 1728   CREATININE 1.07 11/18/2014 0613   CALCIUM 9.1 11/18/2014 0613   PROT 6.2 11/14/2014 1053   PROT 6.7 08/10/2014 1728   ALBUMIN 3.4* 11/14/2014 1053   AST 45* 11/14/2014 1053   ALT 28 11/14/2014 1053   ALKPHOS 43 11/14/2014 1053   BILITOT 0.6 11/14/2014 1053   GFRNONAA 48* 11/18/2014 0613   GFRAA 56* 11/18/2014 0613   Lab Results  Component Value Date   CHOL 176 11/15/2014   HDL 30* 11/15/2014   LDLCALC 84 11/15/2014   TRIG 312* 11/15/2014   CHOLHDL 5.9 11/15/2014   Lab Results  Component Value Date   HGBA1C 7.3* 11/15/2014   Lab Results  Component Value Date   VITAMINB12 321 10/21/2013   Lab Results  Component Value Date   TSH 1.94 04/28/2014      ASSESSMENT AND PLAN 80 y.o. year old female  has a past medical history of Coronary artery disease; Congestive heart failure; Myocardial infarction; Hypertension; Diabetes mellitus; Shortness of breath; Anemia; Blood transfusion; Hypothyroidism; Urinary frequency; Urinary tract infection; GERD (gastroesophageal reflux disease); H/O hiatal hernia; Arthritis; Depression; Spinal stenosis; Cataract; Blood transfusion without reported diagnosis; Complications affecting other specified body systems, hypertension; Other malaise and fatigue; Dysuria; Unspecified vitamin D deficiency; Anxiety state, unspecified; Other B-complex deficiencies; Abnormality of gait; Other and unspecified hyperlipidemia; and Stroke. here with:  1. History of stroke 2. Memory disturbance  Continue Plavix for secondary stroke prevention and maintain strict control of  hypertension with blood pressure goal below 130/90, diabetes with hemoglobin A1c goal below 6.5% and lipids with LDL cholesterol goal below 100 mg/dL. Abigail Brooks advised to continue monitoring her diet. Abigail Brooks's memory has remained stable. Her MMSE today is 23/30. We will continue to monitor. Abigail Brooks advised that if she has any strokelike symptoms she should go to the emergency room immediately. She will follow-up in 6 months or sooner if needed.  Have participated in and made any corrections needed to history, physical, neuro exam,assessment and plan as stated above.  Naomie Dean, MD Stroke Neurology [phone] [facility]      Butch Penny, MSN, NP-C 04/11/2015, 3:58 PM [facility] [address], [address] [place], [place] [address] [phone]

## 2015-04-29 DIAGNOSIS — Z794 Long term (current) use of insulin: Secondary | ICD-10-CM | POA: Diagnosis not present

## 2015-04-29 DIAGNOSIS — E1165 Type 2 diabetes mellitus with hyperglycemia: Secondary | ICD-10-CM | POA: Diagnosis not present

## 2015-05-02 DIAGNOSIS — N390 Urinary tract infection, site not specified: Secondary | ICD-10-CM | POA: Diagnosis not present

## 2015-05-02 DIAGNOSIS — N200 Calculus of kidney: Secondary | ICD-10-CM | POA: Diagnosis not present

## 2015-05-24 DIAGNOSIS — N390 Urinary tract infection, site not specified: Secondary | ICD-10-CM | POA: Diagnosis not present

## 2015-05-30 DIAGNOSIS — N390 Urinary tract infection, site not specified: Secondary | ICD-10-CM | POA: Diagnosis not present

## 2015-06-19 DIAGNOSIS — N390 Urinary tract infection, site not specified: Secondary | ICD-10-CM | POA: Diagnosis not present

## 2015-06-29 DIAGNOSIS — N390 Urinary tract infection, site not specified: Secondary | ICD-10-CM | POA: Diagnosis not present

## 2015-06-29 DIAGNOSIS — N399 Disorder of urinary system, unspecified: Secondary | ICD-10-CM | POA: Diagnosis not present

## 2015-07-27 DIAGNOSIS — D518 Other vitamin B12 deficiency anemias: Secondary | ICD-10-CM | POA: Diagnosis not present

## 2015-07-27 DIAGNOSIS — R5383 Other fatigue: Secondary | ICD-10-CM | POA: Diagnosis not present

## 2015-07-27 DIAGNOSIS — N390 Urinary tract infection, site not specified: Secondary | ICD-10-CM | POA: Diagnosis not present

## 2015-07-27 DIAGNOSIS — D649 Anemia, unspecified: Secondary | ICD-10-CM | POA: Diagnosis not present

## 2015-08-10 DIAGNOSIS — H04123 Dry eye syndrome of bilateral lacrimal glands: Secondary | ICD-10-CM | POA: Diagnosis not present

## 2015-08-10 DIAGNOSIS — Z961 Presence of intraocular lens: Secondary | ICD-10-CM | POA: Diagnosis not present

## 2015-08-10 DIAGNOSIS — H43813 Vitreous degeneration, bilateral: Secondary | ICD-10-CM | POA: Diagnosis not present

## 2015-08-10 DIAGNOSIS — H43393 Other vitreous opacities, bilateral: Secondary | ICD-10-CM | POA: Diagnosis not present

## 2015-08-10 DIAGNOSIS — E119 Type 2 diabetes mellitus without complications: Secondary | ICD-10-CM | POA: Diagnosis not present

## 2015-08-10 DIAGNOSIS — H10413 Chronic giant papillary conjunctivitis, bilateral: Secondary | ICD-10-CM | POA: Diagnosis not present

## 2015-08-31 DIAGNOSIS — R300 Dysuria: Secondary | ICD-10-CM | POA: Diagnosis not present

## 2015-08-31 DIAGNOSIS — Z79899 Other long term (current) drug therapy: Secondary | ICD-10-CM | POA: Diagnosis not present

## 2015-08-31 DIAGNOSIS — N390 Urinary tract infection, site not specified: Secondary | ICD-10-CM | POA: Diagnosis not present

## 2015-08-31 DIAGNOSIS — R109 Unspecified abdominal pain: Secondary | ICD-10-CM | POA: Diagnosis not present

## 2015-09-20 DIAGNOSIS — E119 Type 2 diabetes mellitus without complications: Secondary | ICD-10-CM | POA: Diagnosis not present

## 2015-10-03 DIAGNOSIS — Z79899 Other long term (current) drug therapy: Secondary | ICD-10-CM | POA: Diagnosis not present

## 2015-10-03 DIAGNOSIS — N390 Urinary tract infection, site not specified: Secondary | ICD-10-CM | POA: Diagnosis not present

## 2015-10-05 DIAGNOSIS — E0822 Diabetes mellitus due to underlying condition with diabetic chronic kidney disease: Secondary | ICD-10-CM | POA: Diagnosis not present

## 2015-10-05 DIAGNOSIS — N390 Urinary tract infection, site not specified: Secondary | ICD-10-CM | POA: Diagnosis not present

## 2015-10-12 ENCOUNTER — Encounter: Payer: Self-pay | Admitting: *Deleted

## 2015-10-12 ENCOUNTER — Ambulatory Visit (INDEPENDENT_AMBULATORY_CARE_PROVIDER_SITE_OTHER): Payer: Medicare Other | Admitting: Adult Health

## 2015-10-12 ENCOUNTER — Encounter: Payer: Self-pay | Admitting: Adult Health

## 2015-10-12 VITALS — BP 176/74 | HR 60 | Ht 59.0 in | Wt 203.5 lb

## 2015-10-12 DIAGNOSIS — Z8673 Personal history of transient ischemic attack (TIA), and cerebral infarction without residual deficits: Secondary | ICD-10-CM

## 2015-10-12 DIAGNOSIS — R413 Other amnesia: Secondary | ICD-10-CM

## 2015-10-12 NOTE — Progress Notes (Signed)
I agree with the assessment and plan as directed by NP .The patient is known to me .   Laith Antonelli, MD  

## 2015-10-12 NOTE — Patient Instructions (Signed)
Continue Plavix for  stroke prevention   maintain strict control of  blood pressure goal below 130/90,  diabetes with hemoglobin A1c goal below 6.5%  lipids with LDL cholesterol goal below 100 mg/dL. Memory score is improved. If your symptoms worsen or you develop new symptoms please let us know.

## 2015-10-12 NOTE — Progress Notes (Signed)
PATIENT: Abigail Brooks DOB: [birth year]  REASON FOR VISIT: follow up- history of stroke, memory disturbance HISTORY FROM: patient  HISTORY OF PRESENT ILLNESS: [name] is a 81 year old female with a history of stroke and memory disturbance. She returns today for follow-up. The patient remains on Plavix and is tolerating it well. Her blood pressure is slightly elevated today. She states that they are taking it at her facility but she is unsure if it's been elevated. Patient's latest hemoglobin A1c was 6.9%. She was recently treated for urinary tract infection. Her son states that she typically gets an infection once a month. She continues on Lipitor for her cholesterol. She denies any additional strokelike symptoms. She states that she is having trouble with the left leg due to knee discomfort. She uses a wheelchair for longer distances but can use a walker when ambulating. She denies any new neurological symptoms. She returns today for an evaluation.  UPDATE 04/11/15 [name] is a 81 year old female with a history of stroke. She returns today for follow-up. The patient is currently taking Plavix and tolerating it well. She reports no significant bruising or bleeding. The patient's blood pressure has been controlled although today is slightly elevated. The patient's primary care has been managing her hypertension, hyperlipidemia and diabetes. She states that they just recently had lab work last week at the nursing home. The patient primarily uses a wheelchair or walker when ambulating. She states that she can use a walker for shorter distances and uses the wheelchair for longer distances. She states that she continues to have left-sided weakness although it has improved. Patient's son reports that since she has been at [facility] she has not had any falls. He states that prior she was falling at least once a week. He also states that the patient has recurrent UTIs. She is currently on antibiotics.  He denies any significant change in her memory. Patient agrees with this. She denies any new neurological symptoms. She returns today for an evaluation.  HISTORY : Initial Consult 04/22/2014 : 34 year Caucasian lady who had an episode of transient loss of consciousness and slurred speech and left-sided weakness on 02/21/14. Her son was talking to her on the phone and noticed that she was not responding speaking. He immediately drove to her house about 30 minutes later noticed that she was would respond to him but her speech was slurred and she had some left-sided weakness. She presented to [facility]. She was felt to have had a stroke however MRI scan of the brain revealed no acute infarct. She was also found to have Escherichia coli Unrinary tract infection and treated with antibiotics. She had a history of recurrent bladder infections with confusion and a sensation following these in the past. She also had history of multiple falls. She had history of severe lumbar spinal stenosis with neurogenic claudication and had undergone prior surgery twice. The rest of her stroke workup was unremarkable including transthoracic echo which showed ejection fraction of 55-60% without any cardiac source of embolism. Carotid Doppler showed no significant extracranial stenosis. Lipid profile is significant for elevated LDL cholesterol of 105 mg percent and elevated triglycerides for which she was started on fenofibrate and Lipitor. Hemoglobin A1c was 6.4. Patient had previous been on aspirin which which was switched to Plavix. She was in consultation by Dr. Thad Ranger not by stroke team.. EEG was not done.Son is unable to tell me if she has been tried on medications like Aricept or Namenda  in the past. She however has had improvement in her mental status and is back to baseline. She has no residual weakness. She was walking with a cane with multiple falls and recently she has been switched to a walker. She is currently  getting a home physical therapy were working with advocating her to use a walker correctly. She yet has had 2 falls this week already. She has no known prior history of strokes TIAs seizures. It is unclear to me whether she's had dementia workup in the past or treatment..Behavioural disturbance was documented in her chart and she was advised to stay on Seroquel.  Update 08/26/2014 : She returns for follow-up to see me after last visit 4 months ago. She is accompanied by her daughter provides history. Patient was actually seen on 06/10/14 by Dr. Lucia Gaskins urgently as she complained of increasing left leg weakness and was found on MRI to have acute on chronic right ACA territory infarct occluded left ACA in the A2 segment. Widespread anterior circulation atherosclerosis, with moderate left MCA M1 and right supra clinoid ICA stenoses. She was advised to take aspirin and Plavix for 3 months and aggressive medical risk factor control. She has noticed some improvement but states that the left leg still wobbles and she can fall easily and needs a walker all the time. She has also had intermittent confusion and behavioral disturbances and memory loss which seem to be more pronounced when she has recurrent UTI. She had normal vitamin B12, TSH and RPR in March 2015. She has been diagnosed with renal stones and sees the urologist Dr. Brunilda Payor. She is currently on Klonopin and cervical from her primary physician. On Mini-Mental testing today she scored 23/30 and scored 8 on the geriatric depression scale. She is not been tried on any antidepressants. Update 12/28/2014 : She is seen today for follow-up after recent hospital admission for stroke on 11/14/14.brought to the emergency room and code stroke status after developing weakness involving her left extremities. She was last seen well at 6:30 AM today 11/14/2014. She was noted at around 10:30 AM to have weakness on her left side by family members. Speech was unchanged and face did  not appear to be and on either side. She has been on Plavix daily for antiplatelet therapy. CT scan of her head showed findings compatible with acute to subacute right frontal lobe infarction involving the ACA distribution. NIH stroke score was 6. Patient was beyond time window for treatment consideration with TPA in addition to having recent stroke demonstrated on CT scan. She was admitted for further evaluation and treatment.Ct Head Wo 11/14/2014 Findings compatible with acute to subacute right frontal lobe infarct in the anterior cerebral artery distribution. No hemorrhage.Mri & Mra Head/brain Wo Cm 11/14/2014 1. Confluent right ACA infarct with cytotoxic edema but no hemorrhage or significant mass effect. 2. Associated proximal extension since 2015 of the known right ACA occlusion; the entire right A2 segment is now occluded, with poor flow in the right A1 segment. 3. Otherwise stable brain and intracranial MRA since 05/29/2014. Carotid Doppler There is 1-39% bilateral ICA stenosis. Vertebral artery flow is antegrade. 2D echo - - Left ventricle: The cavity size was normal. Wall thickness was increased in a pattern of mild LVH. Systolic function was vigorous. The estimated ejection fraction was in the range of 65%to 70%. Wall motion was normal; there were no regional wallmotion abnormalities. She was seen by physical and occupational therapy and felt unsafe to go home and hence was  transferred for rehabilitation to skilled nursing facility. She has finished physical and occupational therapy and is now moved to the long-term skilled nursing section. She can ambulate with a walker short distances but uses a wheelchair for long distances. She has had mild cognitive difficulties as well. She still complains of mild weakness in the left side. She is tolerating Plavix well without bleeding or bruising. Her blood pressure is under good control though it is elevated in office today at 168/77. She has been  started on sliding scale insulin for better glucose control.   REVIEW OF SYSTEMS: Out of a complete 14 system review of symptoms, the patient complains only of the following symptoms, and all other reviewed systems are negative.  ALLERGIES: Allergies  Allergen Reactions  . Morphine And Related Other (See Comments)    Makes her crazy  . Enalapril Swelling    Facial swelling    HOME MEDICATIONS: Outpatient Prescriptions Prior to Visit  Medication Sig Dispense Refill  . metoprolol (LOPRESSOR) 100 MG tablet TAKE 1 TABLET BY MOUTH TWICE A DAY 180 tablet 1  . atorvastatin (LIPITOR) 10 MG tablet TAKE 1 TABLET BY MOUTH EVERY DAY AT 6PM 90 tablet 1  . buPROPion (WELLBUTRIN SR) 100 MG 12 hr tablet Take 1 tablet (100 mg total) by mouth daily. 60 tablet 1  . Calcium Carbonate-Vitamin D (CALCIUM + D PO) Take 1 tablet by mouth daily. Calcium 315 mg,Vitamin D3 250 I.U.    . cephALEXin (KEFLEX) 250 MG capsule Take 250 mg by mouth daily.    . clonazePAM (KLONOPIN) 0.5 MG tablet     . clopidogrel (PLAVIX) 75 MG tablet TAKE 1 TABLET BY MOUTH EVERY DAY 90 tablet 1  . diclofenac sodium (VOLTAREN) 1 % GEL Apply 4 g topically 2 (two) times daily as needed (for knee pain).    . fenofibrate 54 MG tablet Take 1 tablet (54 mg total) by mouth daily. 90 tablet 1  . ferrous sulfate 325 (65 FE) MG tablet TAKE 1 TABLET BY MOUTH EVERY MORNING WITH BREAKFAST 90 tablet 1  . fesoterodine (TOVIAZ) 4 MG TB24 tablet Take 4 mg by mouth daily.    . fish oil-omega-3 fatty acids 1000 MG capsule Take 1 g by mouth daily.     . furosemide (LASIX) 20 MG tablet Take 20 mg by mouth. Three times per week.    . hydrALAZINE (APRESOLINE) 25 MG tablet Take 25 mg by mouth 3 (three) times daily.    Marland Kitchen ibuprofen (ADVIL,MOTRIN) 400 MG tablet Take 400 mg by mouth every 6 (six) hours as needed.    Marland Kitchen losartan-hydrochlorothiazide (HYZAAR) 50-12.5 MG per tablet Take 1 tablet by mouth daily.  1  . metFORMIN (GLUCOPHAGE) 500 MG tablet Take 1 tablet  (500 mg total) by mouth daily with breakfast. 90 tablet 3  . nitroGLYCERIN (NITROSTAT) 0.4 MG SL tablet Place 0.4 mg under the tongue every 5 (five) minutes as needed for chest pain. Max 3 tablets in 15 minutes    . nystatin-triamcinolone ointment (MYCOLOG) Apply 1 application topically 2 (two) times daily. 60 g 1  . oxyCODONE-acetaminophen (PERCOCET/ROXICET) 5-325 MG per tablet   0  . phenazopyridine (PYRIDIUM) 100 MG tablet Take 100 mg by mouth 3 (three) times daily as needed for pain.    . polyethylene glycol (MIRALAX / GLYCOLAX) packet Take 17 g by mouth daily as needed for mild constipation or moderate constipation.     . QUEtiapine (SEROQUEL) 25 MG tablet TAKE 1 TABLET BY MOUTH EVERY  MORNING & 2 TABLETS IN THE EVENING 270 tablet 1  . senna-docusate (SENNA S) 8.6-50 MG per tablet Take 1 tablet by mouth 2 (two) times daily.    . insulin aspart (NOVOLOG) 100 UNIT/ML injection Inject into the skin 3 (three) times daily before meals.     No facility-administered medications prior to visit.    PAST MEDICAL HISTORY: Past Medical History  Diagnosis Date  . Coronary artery disease     stents x 2, sees Dr. Halina Andreas practice  . Congestive heart failure (HCC)     2012  . Myocardial infarction [facility])     "unsure of when"  . Hypertension   . Diabetes mellitus   . Shortness of breath   . Anemia   . Blood transfusion     when had hysterectomy  . Hypothyroidism     has had hx of treatment  . Urinary frequency   . Urinary tract infection     hx of  . GERD (gastroesophageal reflux disease)   . H/O hiatal hernia   . Arthritis   . Depression   . Spinal stenosis     with bone spurs and ruptured disc  . Cataract   . Blood transfusion without reported diagnosis   . Complications affecting other specified body systems, hypertension   . Other malaise and fatigue   . Dysuria   . Unspecified vitamin D deficiency   . Anxiety state, unspecified   . Other B-complex deficiencies   .  Abnormality of gait   . Other and unspecified hyperlipidemia   . Stroke [facility])     PAST SURGICAL HISTORY: Past Surgical History  Procedure Laterality Date  . Cataract extraction w/ intraocular lens  implant, bilateral    . Bladder suspension    . Coronary      stents x 2, stent implant placed9/21/06  . Back surgery      ruptured disc, lumbar area  . Appendectomy    . Cardiac catheterization    . Lumbar laminectomy/decompression microdiscectomy  12/21/2011    Procedure: LUMBAR LAMINECTOMY/DECOMPRESSION MICRODISCECTOMY 1 LEVEL;  Surgeon: Temple Pacini, MD;  Location: [facility];  Service: Neurosurgery;  Laterality: Bilateral;  Lumbar four-five decompressive lumbar laminectomy; Right Lumbar four-five microdiscectomy  . Eye surgery    . Spine surgery    . Abdominal hysterectomy  1986    FAMILY HISTORY: Family History  Problem Relation Age of Onset  . Anesthesia problems Sister   . Anesthesia problems Son   . Thyroid cancer Son   . Heart disease Mother   . Diabetes Sister     SOCIAL HISTORY: Social History   Social History  . Marital Status: Divorced    Spouse Name: N/A  . Number of Children: 3  . Years of Education: 8th   Occupational History  . retired Other   Social History Main Topics  . Smoking status: Former Smoker    Types: Cigarettes  . Smokeless tobacco: Current User    Types: Snuff     Comment: 40 years ago  . Alcohol Use: No  . Drug Use: No  . Sexual Activity: No   Other Topics Concern  . Not on file   Social History Narrative   Patient lives at [place] [facility] [place]    Patient is right handed.   Caffeine use: none      PHYSICAL EXAM  Filed Vitals:   10/12/15 1550  BP: 176/74  Pulse: 60  Height: 4\' 11"  (1.499  m)  Weight: 203 lb 8 oz (92.307 kg)   Body mass index is 41.08 kg/(m^2).  Generalized: Well developed, in no acute distress   Neurological examination  Mentation: Alert oriented to time, place, history taking. Follows  all commands speech and language fluent Cranial nerve II-XII: Pupils were equal round reactive to light. Extraocular movements were full, visual field were full on confrontational test. Facial sensation and strength were normal. Uvula tongue midline. Head turning and shoulder shrug  were normal and symmetric. Motor: The motor testing reveals 5 over 5 strength of all 4 extremities- with giveaway weakness in the left lower extremity. Good symmetric motor tone is noted throughout.  Sensory: Sensory testing is intact to soft touch on all 4 extremities. No evidence of extinction is noted.  Coordination: Cerebellar testing reveals good finger-nose-finger and heel-to-shin bilaterally.  Gait and station: She is in a wheelchair. Reflexes: Deep tendon reflexes are symmetric and normal bilaterally.   DIAGNOSTIC DATA (LABS, IMAGING, TESTING) - I reviewed patient records, labs, notes, testing and imaging myself where available.  Lab Results  Component Value Date   WBC 8.4 11/14/2014   HGB 13.3 11/14/2014   HCT 39.0 11/14/2014   MCV 88.9 11/14/2014   PLT 187 11/14/2014      Component Value Date/Time   NA 141 11/18/2014 0613   NA 140 08/10/2014 1728   K 3.8 11/18/2014 0613   CL 108 11/18/2014 0613   CO2 26 11/18/2014 0613   GLUCOSE 175* 11/18/2014 0613   GLUCOSE 119* 08/10/2014 1728   BUN 20 11/18/2014 0613   BUN 23 08/10/2014 1728   CREATININE 1.07 11/18/2014 0613   CALCIUM 9.1 11/18/2014 0613   PROT 6.2 11/14/2014 1053   PROT 6.7 08/10/2014 1728   ALBUMIN 3.4* 11/14/2014 1053   ALBUMIN 4.4 08/10/2014 1728   AST 45* 11/14/2014 1053   ALT 28 11/14/2014 1053   ALKPHOS 43 11/14/2014 1053   BILITOT 0.6 11/14/2014 1053   GFRNONAA 48* 11/18/2014 0613   GFRAA 56* 11/18/2014 0613   Lab Results  Component Value Date   CHOL 176 11/15/2014   HDL 30* 11/15/2014   LDLCALC 84 11/15/2014   TRIG 312* 11/15/2014   CHOLHDL 5.9 11/15/2014   Lab Results  Component Value Date   HGBA1C 7.3*  11/15/2014   Lab Results  Component Value Date   VITAMINB12 321 10/21/2013   Lab Results  Component Value Date   TSH 1.94 04/28/2014      ASSESSMENT AND PLAN 81 y.o. year old female  has a past medical history of Coronary artery disease; Congestive heart failure (HCC); Myocardial infarction (HCC); Hypertension; Diabetes mellitus; Shortness of breath; Anemia; Blood transfusion; Hypothyroidism; Urinary frequency; Urinary tract infection; GERD (gastroesophageal reflux disease); H/O hiatal hernia; Arthritis; Depression; Spinal stenosis; Cataract; Blood transfusion without reported diagnosis; Complications affecting other specified body systems, hypertension; Other malaise and fatigue; Dysuria; Unspecified vitamin D deficiency; Anxiety state, unspecified; Other B-complex deficiencies; Abnormality of gait; Other and unspecified hyperlipidemia; and Stroke (HCC). here with:  1. History of stroke 2. Memory disturbance  Continue Plavix for secondary stroke prevention and maintain strict control of hypertension with blood pressure goal below 130/90, diabetes with hemoglobin A1c goal below 6.5% and lipids with LDL cholesterol goal below 100 mg/dL. patient's blood pressure is elevated today. I have requested that her facility check her blood pressure daily. If it remains elevated her medication needs to be adjusted.. Patient's memory has remained stable. Her numerous score has improved. MMSE is 28/30 We  will continue to monitor. Patient advised that if she has any strokelike symptoms she should go to the emergency room immediately. She will follow-up in 6 months or sooner if needed    Butch Penny, MSN, NP-C 10/12/2015, 4:08 PM [facility] [address], [address] [place], [place] [address] [phone]

## 2015-10-12 NOTE — Progress Notes (Signed)
Faxed report/findings and recommendations from Butch Penny, NP back to [facility]. Fax: [phone]. Received confirmation.

## 2015-11-21 DIAGNOSIS — Z79899 Other long term (current) drug therapy: Secondary | ICD-10-CM | POA: Diagnosis not present

## 2015-11-21 DIAGNOSIS — R300 Dysuria: Secondary | ICD-10-CM | POA: Diagnosis not present

## 2015-11-23 DIAGNOSIS — J209 Acute bronchitis, unspecified: Secondary | ICD-10-CM | POA: Diagnosis not present

## 2015-11-23 DIAGNOSIS — N390 Urinary tract infection, site not specified: Secondary | ICD-10-CM | POA: Diagnosis not present

## 2015-11-30 DIAGNOSIS — R05 Cough: Secondary | ICD-10-CM | POA: Diagnosis not present

## 2015-12-01 DIAGNOSIS — R7989 Other specified abnormal findings of blood chemistry: Secondary | ICD-10-CM | POA: Diagnosis not present

## 2015-12-03 DIAGNOSIS — R05 Cough: Secondary | ICD-10-CM | POA: Diagnosis not present

## 2015-12-19 DIAGNOSIS — E1165 Type 2 diabetes mellitus with hyperglycemia: Secondary | ICD-10-CM | POA: Diagnosis not present

## 2015-12-19 DIAGNOSIS — E119 Type 2 diabetes mellitus without complications: Secondary | ICD-10-CM | POA: Diagnosis not present

## 2015-12-19 DIAGNOSIS — Z79899 Other long term (current) drug therapy: Secondary | ICD-10-CM | POA: Diagnosis not present

## 2015-12-23 DIAGNOSIS — N390 Urinary tract infection, site not specified: Secondary | ICD-10-CM | POA: Diagnosis not present

## 2015-12-23 DIAGNOSIS — R319 Hematuria, unspecified: Secondary | ICD-10-CM | POA: Diagnosis not present

## 2016-01-06 DIAGNOSIS — Z79899 Other long term (current) drug therapy: Secondary | ICD-10-CM | POA: Diagnosis not present

## 2016-01-06 DIAGNOSIS — N390 Urinary tract infection, site not specified: Secondary | ICD-10-CM | POA: Diagnosis not present

## 2016-01-06 DIAGNOSIS — R319 Hematuria, unspecified: Secondary | ICD-10-CM | POA: Diagnosis not present

## 2016-01-09 DIAGNOSIS — N390 Urinary tract infection, site not specified: Secondary | ICD-10-CM | POA: Diagnosis not present

## 2016-01-20 DIAGNOSIS — I308 Other forms of acute pericarditis: Secondary | ICD-10-CM | POA: Diagnosis not present

## 2016-02-10 DIAGNOSIS — E0840 Diabetes mellitus due to underlying condition with diabetic neuropathy, unspecified: Secondary | ICD-10-CM | POA: Diagnosis not present

## 2016-02-10 DIAGNOSIS — I5030 Unspecified diastolic (congestive) heart failure: Secondary | ICD-10-CM | POA: Diagnosis not present

## 2016-02-10 DIAGNOSIS — I10 Essential (primary) hypertension: Secondary | ICD-10-CM | POA: Diagnosis not present

## 2016-02-10 DIAGNOSIS — F0390 Unspecified dementia without behavioral disturbance: Secondary | ICD-10-CM | POA: Diagnosis not present

## 2016-02-12 DIAGNOSIS — Z7901 Long term (current) use of anticoagulants: Secondary | ICD-10-CM | POA: Diagnosis not present

## 2016-02-12 DIAGNOSIS — E0590 Thyrotoxicosis, unspecified without thyrotoxic crisis or storm: Secondary | ICD-10-CM | POA: Diagnosis not present

## 2016-02-12 DIAGNOSIS — Z792 Long term (current) use of antibiotics: Secondary | ICD-10-CM | POA: Diagnosis not present

## 2016-02-13 DIAGNOSIS — R05 Cough: Secondary | ICD-10-CM | POA: Diagnosis not present

## 2016-02-17 DIAGNOSIS — R918 Other nonspecific abnormal finding of lung field: Secondary | ICD-10-CM | POA: Diagnosis not present

## 2016-03-20 DIAGNOSIS — R7989 Other specified abnormal findings of blood chemistry: Secondary | ICD-10-CM | POA: Diagnosis not present

## 2016-04-17 ENCOUNTER — Encounter: Payer: Self-pay | Admitting: Neurology

## 2016-04-17 ENCOUNTER — Ambulatory Visit (INDEPENDENT_AMBULATORY_CARE_PROVIDER_SITE_OTHER): Payer: Medicare Other | Admitting: Neurology

## 2016-04-17 VITALS — BP 137/68 | HR 57 | Ht 59.0 in | Wt 199.8 lb

## 2016-04-17 DIAGNOSIS — G3184 Mild cognitive impairment, so stated: Secondary | ICD-10-CM

## 2016-04-17 NOTE — Progress Notes (Signed)
PATIENT: Abigail Brooks DOB: [birth year]  REASON FOR VISIT: follow up- history of stroke, memory disturbance HISTORY FROM: patient  HISTORY OF PRESENT ILLNESS: [name] is a 81 year old female with a history of stroke and memory disturbance. She returns today for follow-up. The patient remains on Plavix and is tolerating it well. Her blood pressure is slightly elevated today. She states that they are taking it at her facility but she is unsure if it's been elevated. Patient's latest hemoglobin A1c was 6.9%. She was recently treated for urinary tract infection. Her son states that she typically gets an infection once a month. She continues on Lipitor for her cholesterol. She denies any additional strokelike symptoms. She states that she is having trouble with the left leg due to knee discomfort. She uses a wheelchair for longer distances but can use a walker when ambulating. She denies any new neurological symptoms. She returns today for an evaluation.  UPDATE 04/11/15 [name] is a 81 year old female with a history of stroke. She returns today for follow-up. The patient is currently taking Plavix and tolerating it well. She reports no significant bruising or bleeding. The patient's blood pressure has been controlled although today is slightly elevated. The patient's primary care has been managing her hypertension, hyperlipidemia and diabetes. She states that they just recently had lab work last week at the nursing home. The patient primarily uses a wheelchair or walker when ambulating. She states that she can use a walker for shorter distances and uses the wheelchair for longer distances. She states that she continues to have left-sided weakness although it has improved. Patient's son reports that since she has been at [facility] she has not had any falls. He states that prior she was falling at least once a week. He also states that the patient has recurrent UTIs. She is currently on antibiotics.  He denies any significant change in her memory. Patient agrees with this. She denies any new neurological symptoms. She returns today for an evaluation.  HISTORY : Initial Consult 04/22/2014 : 34 year Caucasian lady who had an episode of transient loss of consciousness and slurred speech and left-sided weakness on 02/21/14. Her son was talking to her on the phone and noticed that she was not responding speaking. He immediately drove to her house about 30 minutes later noticed that she was would respond to him but her speech was slurred and she had some left-sided weakness. She presented to [facility]. She was felt to have had a stroke however MRI scan of the brain revealed no acute infarct. She was also found to have Escherichia coli Unrinary tract infection and treated with antibiotics. She had a history of recurrent bladder infections with confusion and a sensation following these in the past. She also had history of multiple falls. She had history of severe lumbar spinal stenosis with neurogenic claudication and had undergone prior surgery twice. The rest of her stroke workup was unremarkable including transthoracic echo which showed ejection fraction of 55-60% without any cardiac source of embolism. Carotid Doppler showed no significant extracranial stenosis. Lipid profile is significant for elevated LDL cholesterol of 105 mg percent and elevated triglycerides for which she was started on fenofibrate and Lipitor. Hemoglobin A1c was 6.4. Patient had previous been on aspirin which which was switched to Plavix. She was in consultation by Dr. Thad Ranger not by stroke team.. EEG was not done.Son is unable to tell me if she has been tried on medications like Aricept or Namenda  in the past. She however has had improvement in her mental status and is back to baseline. She has no residual weakness. She was walking with a cane with multiple falls and recently she has been switched to a walker. She is currently  getting a home physical therapy were working with advocating her to use a walker correctly. She yet has had 2 falls this week already. She has no known prior history of strokes TIAs seizures. It is unclear to me whether she's had dementia workup in the past or treatment..Behavioural disturbance was documented in her chart and she was advised to stay on Seroquel.  Update 08/26/2014 : She returns for follow-up to see me after last visit 4 months ago. She is accompanied by her daughter provides history. Patient was actually seen on 06/10/14 by Dr. Lucia Gaskins urgently as she complained of increasing left leg weakness and was found on MRI to have acute on chronic right ACA territory infarct occluded left ACA in the A2 segment. Widespread anterior circulation atherosclerosis, with moderate left MCA M1 and right supra clinoid ICA stenoses. She was advised to take aspirin and Plavix for 3 months and aggressive medical risk factor control. She has noticed some improvement but states that the left leg still wobbles and she can fall easily and needs a walker all the time. She has also had intermittent confusion and behavioral disturbances and memory loss which seem to be more pronounced when she has recurrent UTI. She had normal vitamin B12, TSH and RPR in March 2015. She has been diagnosed with renal stones and sees the urologist Dr. Brunilda Payor. She is currently on Klonopin and cervical from her primary physician. On Mini-Mental testing today she scored 23/30 and scored 8 on the geriatric depression scale. She is not been tried on any antidepressants. Update 12/28/2014 : She is seen today for follow-up after recent hospital admission for stroke on 11/14/14.brought to the emergency room and code stroke status after developing weakness involving her left extremities. She was last seen well at 6:30 AM today 11/14/2014. She was noted at around 10:30 AM to have weakness on her left side by family members. Speech was unchanged and face did  not appear to be and on either side. She has been on Plavix daily for antiplatelet therapy. CT scan of her head showed findings compatible with acute to subacute right frontal lobe infarction involving the ACA distribution. NIH stroke score was 6. Patient was beyond time window for treatment consideration with TPA in addition to having recent stroke demonstrated on CT scan. She was admitted for further evaluation and treatment.Ct Head Wo 11/14/2014 Findings compatible with acute to subacute right frontal lobe infarct in the anterior cerebral artery distribution. No hemorrhage.Mri & Mra Head/brain Wo Cm 11/14/2014 1. Confluent right ACA infarct with cytotoxic edema but no hemorrhage or significant mass effect. 2. Associated proximal extension since 2015 of the known right ACA occlusion; the entire right A2 segment is now occluded, with poor flow in the right A1 segment. 3. Otherwise stable brain and intracranial MRA since 05/29/2014. Carotid Doppler There is 1-39% bilateral ICA stenosis. Vertebral artery flow is antegrade. 2D echo - - Left ventricle: The cavity size was normal. Wall thickness was increased in a pattern of mild LVH. Systolic function was vigorous. The estimated ejection fraction was in the range of 65%to 70%. Wall motion was normal; there were no regional wallmotion abnormalities. She was seen by physical and occupational therapy and felt unsafe to go home and hence was  transferred for rehabilitation to skilled nursing facility. She has finished physical and occupational therapy and is now moved to the long-term skilled nursing section. She can ambulate with a walker short distances but uses a wheelchair for long distances. She has had mild cognitive difficulties as well. She still complains of mild weakness in the left side. She is tolerating Plavix well without bleeding or bruising. Her blood pressure is under good control though it is elevated in office today at 168/77. She has been  started on sliding scale insulin for better glucose control.  Update 04/17/2016 ;  she returns for follow-up after last visit 5 months ago. She is accompanied by her son. Patient is currently living in [facility] skilled nursing facility. She is able to walk with a walker. She is careful and has had no recent falls. She continues to have mild memory difficulties. She gets confused at times given the names of occasions. At times she sees people are not there. The son feels that her confusion episodes are more related to bladder infections. Overall he feels her memory is gradually getting worse. Patient remains on Plavix for stroke prevention which is tolerating well without bruising or bleeding. Her blood pressure is well controlled and today it is [id number]. Her fasting sugars have have been good and last hemoglobin A1c was 6.6. She remains on Lipitor which is tolerating well without muscle aches or pains. She's had no recurrent stroke or TIA symptoms. REVIEW OF SYSTEMS: Out of a complete 14 system review of symptoms, the patient complains only of the following symptoms memory loss, weakness, incontinence of bladder, frequency of urination, snoring, sleep talking, and all other reviewed systems are negative.  ALLERGIES: Allergies  Allergen Reactions  . Morphine And Related Other (See Comments)    Makes her crazy  . Enalapril Swelling    Facial swelling    HOME MEDICATIONS: Outpatient Medications Prior to Visit  Medication Sig Dispense Refill  . atorvastatin (LIPITOR) 10 MG tablet TAKE 1 TABLET BY MOUTH EVERY DAY AT 6PM 90 tablet 1  . buPROPion (WELLBUTRIN SR) 100 MG 12 hr tablet Take 1 tablet (100 mg total) by mouth daily. 60 tablet 1  . Calcium Carbonate-Vitamin D (CALCIUM + D PO) Take 1 tablet by mouth daily. Calcium 315 mg,Vitamin D3 250 I.U.    . clonazePAM (KLONOPIN) 0.5 MG tablet Take 0.5 mg by mouth daily.     . clopidogrel (PLAVIX) 75 MG tablet TAKE 1 TABLET BY MOUTH EVERY DAY 90 tablet 1    . fenofibrate 54 MG tablet Take 1 tablet (54 mg total) by mouth daily. 90 tablet 1  . ferrous sulfate 325 (65 FE) MG tablet TAKE 1 TABLET BY MOUTH EVERY MORNING WITH BREAKFAST 90 tablet 1  . fesoterodine (TOVIAZ) 4 MG TB24 tablet Take 4 mg by mouth daily.    . fish oil-omega-3 fatty acids 1000 MG capsule Take 1 g by mouth daily.     . furosemide (LASIX) 20 MG tablet Take 20 mg by mouth. Three times per week.    . hydrALAZINE (APRESOLINE) 25 MG tablet Take 25 mg by mouth 3 (three) times daily.    . Hypromellose (ARTIFICIAL TEARS OP) Apply 1 Dose to eye daily.    . insulin glargine (LANTUS) 100 UNIT/ML injection Inject 36 Units into the skin at bedtime.     Marland Kitchen losartan-hydrochlorothiazide (HYZAAR) 50-12.5 MG per tablet Take 1 tablet by mouth daily.  1  . metFORMIN (GLUCOPHAGE) 500 MG tablet Take 1 tablet (500  mg total) by mouth daily with breakfast. 90 tablet 3  . metoprolol tartrate (LOPRESSOR) 25 MG tablet Take 25 mg by mouth 2 (two) times daily.    . nitroGLYCERIN (NITROSTAT) 0.4 MG SL tablet Place 0.4 mg under the tongue every 5 (five) minutes as needed for chest pain. Reported on 10/12/2015    . NOVOLOG FLEXPEN 100 UNIT/ML FlexPen Inject into the skin 2 (two) times daily. SSI- 151-200=4 units 201-250=6 units 251-300=8 units 301-350=10 units 351-400=12 units    . oxyCODONE-acetaminophen (PERCOCET/ROXICET) 5-325 MG per tablet Take 1 tablet by mouth every 8 (eight) hours as needed.   0  . QUEtiapine (SEROQUEL) 25 MG tablet TAKE 1 TABLET BY MOUTH EVERY MORNING & 2 TABLETS IN THE EVENING 270 tablet 1  . senna-docusate (SENNA S) 8.6-50 MG per tablet Take 1 tablet by mouth 2 (two) times daily.     No facility-administered medications prior to visit.     PAST MEDICAL HISTORY: Past Medical History:  Diagnosis Date  . Abnormality of gait   . Anemia   . Anxiety state, unspecified   . Arthritis   . Blood transfusion    when had hysterectomy  . Blood transfusion without reported diagnosis   .  Cataract   . Complications affecting other specified body systems, hypertension   . Congestive heart failure (HCC)    2012  . Coronary artery disease    stents x 2, sees Dr. Halina Andreas practice  . Depression   . Diabetes mellitus   . Dysuria   . GERD (gastroesophageal reflux disease)   . H/O hiatal hernia   . Hypertension   . Hypothyroidism    has had hx of treatment  . Myocardial infarction [facility]    "unsure of when"  . Other and unspecified hyperlipidemia   . Other B-complex deficiencies   . Other malaise and fatigue   . Shortness of breath   . Spinal stenosis    with bone spurs and ruptured disc  . Stroke (HCC)   . Unspecified vitamin D deficiency   . Urinary frequency   . Urinary tract infection    hx of    PAST SURGICAL HISTORY: Past Surgical History:  Procedure Laterality Date  . ABDOMINAL HYSTERECTOMY  1986  . APPENDECTOMY    . BACK SURGERY     ruptured disc, lumbar area  . BLADDER SUSPENSION    . CARDIAC CATHETERIZATION    . CATARACT EXTRACTION W/ INTRAOCULAR LENS  IMPLANT, BILATERAL    . coronary     stents x 2, stent implant placed9/21/06  . EYE SURGERY    . LUMBAR LAMINECTOMY/DECOMPRESSION MICRODISCECTOMY  12/21/2011   Procedure: LUMBAR LAMINECTOMY/DECOMPRESSION MICRODISCECTOMY 1 LEVEL;  Surgeon: Temple Pacini, MD;  Location: [facility];  Service: Neurosurgery;  Laterality: Bilateral;  Lumbar four-five decompressive lumbar laminectomy; Right Lumbar four-five microdiscectomy  . SPINE SURGERY      FAMILY HISTORY: Family History  Problem Relation Age of Onset  . Anesthesia problems Sister   . Stroke Sister   . Anesthesia problems Son   . Thyroid cancer Son   . Heart disease Mother   . Stroke Mother   . Diabetes Sister     SOCIAL HISTORY: Social History   Social History  . Marital status: Divorced    Spouse name: N/A  . Number of children: 3  . Years of education: 8th   Occupational History  . retired Other   Social History Main Topics  .  Smoking status:  Former Smoker    Types: Cigarettes  . Smokeless tobacco: Current User    Types: Snuff     Comment: 40 years ago  . Alcohol use No  . Drug use: No  . Sexual activity: No   Other Topics Concern  . Not on file   Social History Narrative   [facility]    Patient is right handed.   Caffeine use: none      PHYSICAL EXAM  Vitals:   04/17/16 1639  BP: 137/68  Pulse: (!) 57  Weight: 199 lb 12.8 oz (90.6 kg)  Height: 4\' 11"  (1.499 m)   Body mass index is 40.35 kg/m.  Generalized: Well developed Elderly Caucasian lady, in no acute distress   Neurological examination  Mentation: Alert oriented to time, place, history taking. Follows all commands speech and language fluent.Diminished recall 2/3. Diminished animal naming 9 only. Vision intact. Clock drawing 3/4. Cranial nerve II-XII: Pupils were equal round reactive to light. Extraocular movements were full, visual field were full on confrontational test. Facial sensation and strength were normal. Uvula tongue midline. Head turning and shoulder shrug  were normal and symmetric. Motor: The motor testing reveals 5 over 5 strength of all 4 extremities- with giveaway weakness in the left lower extremity. Good symmetric motor tone is noted throughout.  Sensory: Sensory testing is intact to soft touch on all 4 extremities. No evidence of extinction is noted.  Coordination: Cerebellar testing reveals good finger-nose-finger and heel-to-shin bilaterally.  Gait and station: She needs slight help to get up. She walks with a slightly broad-based and ataxic gait. Reflexes: Deep tendon reflexes are symmetric and normal bilaterally.   DIAGNOSTIC DATA (LABS, IMAGING, TESTING) - I reviewed patient records, labs, notes, testing and imaging myself where available.  Lab Results  Component Value Date   WBC 8.4 11/14/2014   HGB 13.3 11/14/2014   HCT 39.0 11/14/2014   MCV 88.9 11/14/2014   PLT 187  11/14/2014      Component Value Date/Time   NA 141 11/18/2014 0613   NA 140 08/10/2014 1728   K 3.8 11/18/2014 0613   CL 108 11/18/2014 0613   CO2 26 11/18/2014 0613   GLUCOSE 175 (H) 11/18/2014 0613   BUN 20 11/18/2014 0613   BUN 23 08/10/2014 1728   CREATININE 1.07 11/18/2014 0613   CALCIUM 9.1 11/18/2014 0613   PROT 6.2 11/14/2014 1053   PROT 6.7 08/10/2014 1728   ALBUMIN 3.4 (L) 11/14/2014 1053   ALBUMIN 4.4 08/10/2014 1728   AST 45 (H) 11/14/2014 1053   ALT 28 11/14/2014 1053   ALKPHOS 43 11/14/2014 1053   BILITOT 0.6 11/14/2014 1053   GFRNONAA 48 (L) 11/18/2014 0613   GFRAA 56 (L) 11/18/2014 0613   Lab Results  Component Value Date   CHOL 176 11/15/2014   HDL 30 (L) 11/15/2014   LDLCALC 84 11/15/2014   TRIG 312 (H) 11/15/2014   CHOLHDL 5.9 11/15/2014   Lab Results  Component Value Date   HGBA1C 7.3 (H) 11/15/2014   Lab Results  Component Value Date   VITAMINB12 321 10/21/2013   Lab Results  Component Value Date   TSH 1.94 04/28/2014      ASSESSMENT AND PLAN 81 y.o. year old female  has a past medical history of Abnormality of gait; Anemia; Anxiety state, unspecified; Arthritis; Blood transfusion; Blood transfusion without reported diagnosis; Cataract; Complications affecting other specified body systems, hypertension; Congestive heart failure (HCC); Coronary artery disease; Depression;  Diabetes mellitus; Dysuria; GERD (gastroesophageal reflux disease); H/O hiatal hernia; Hypertension; Hypothyroidism; Myocardial infarction [facility]); Other and unspecified hyperlipidemia; Other B-complex deficiencies; Other malaise and fatigue; Shortness of breath; Spinal stenosis; Stroke [facility]); Unspecified vitamin D deficiency; Urinary frequency; and Urinary tract infection. here with:  1. History of stroke 2. Mild cognitive Impairment  I had a long d/w patient  and son about her remote strokes. Mild cognitive impairment, risk for recurrent stroke/TIAs, personally independently  reviewed imaging studies and stroke evaluation results and answered questions.Continue Plavix for secondary stroke prevention and maintain strict control of hypertension with blood pressure goal below 130/90, diabetes with hemoglobin A1c goal below 6.5% and lipids with LDL cholesterol goal below 70 mg/dL. I also advised the patient to continue use of walker at all times to help with gait and balance. Start fish oil 1000 mg daily to help with her mild cognitive impairment and continue participation in activities like solving crossword puzzles, sudoku and playing bridge. Patient may also consider possible participation in the IDEAS study to get  amyloid PET scan of the brain to help with differential diagnosis of mild cognitive impairment in planning treatment Followup in the future with  my nurse practitioner in 6 months or call earlier if necessary    Delia Heady, MD, 04/17/2016, 5:50 PM [facility] [address], [address] [place], [place] [address] [phone]

## 2016-04-17 NOTE — Patient Instructions (Signed)
I had a long d/w patient  and son about her remote strokes. Mild cognitive impairment, risk for recurrent stroke/TIAs, personally independently reviewed imaging studies and stroke evaluation results and answered questions.Continue Plavix for secondary stroke prevention and maintain strict control of hypertension with blood pressure goal below 130/90, diabetes with hemoglobin A1c goal below 6.5% and lipids with LDL cholesterol goal below 70 mg/dL. I also advised the patient to continue use of walker at all times to help with gait and balance. Start fish oil 1000 mg daily to help with her mild cognitive impairment and continue participation in activities like solving crossword puzzles, sudoku and playing bridge. Patient may also consider possible participation in the IDEAS study to get  amyloid PET scan of the brain to help with differential diagnosis of mild cognitive impairment in planning treatment Followup in the future with  my nurse practitioner in 6 months or call earlier if necessary

## 2016-05-10 DIAGNOSIS — I10 Essential (primary) hypertension: Secondary | ICD-10-CM | POA: Diagnosis not present

## 2016-05-10 DIAGNOSIS — I679 Cerebrovascular disease, unspecified: Secondary | ICD-10-CM | POA: Diagnosis not present

## 2016-05-10 DIAGNOSIS — E785 Hyperlipidemia, unspecified: Secondary | ICD-10-CM | POA: Diagnosis not present

## 2016-05-10 DIAGNOSIS — E118 Type 2 diabetes mellitus with unspecified complications: Secondary | ICD-10-CM | POA: Diagnosis not present

## 2016-05-11 DIAGNOSIS — Z79899 Other long term (current) drug therapy: Secondary | ICD-10-CM | POA: Diagnosis not present

## 2016-05-11 DIAGNOSIS — R7989 Other specified abnormal findings of blood chemistry: Secondary | ICD-10-CM | POA: Diagnosis not present

## 2016-05-11 DIAGNOSIS — N390 Urinary tract infection, site not specified: Secondary | ICD-10-CM | POA: Diagnosis not present

## 2016-05-11 DIAGNOSIS — E0590 Thyrotoxicosis, unspecified without thyrotoxic crisis or storm: Secondary | ICD-10-CM | POA: Diagnosis not present

## 2016-05-11 DIAGNOSIS — R319 Hematuria, unspecified: Secondary | ICD-10-CM | POA: Diagnosis not present

## 2016-06-11 ENCOUNTER — Other Ambulatory Visit: Payer: Self-pay | Admitting: Internal Medicine

## 2016-06-11 ENCOUNTER — Ambulatory Visit
Admission: RE | Admit: 2016-06-11 | Discharge: 2016-06-11 | Disposition: A | Discharge status: Home / Self Care | Payer: Medicare Other | Source: Ambulatory Visit | Attending: Internal Medicine | Admitting: Internal Medicine

## 2016-06-11 DIAGNOSIS — R109 Unspecified abdominal pain: Secondary | ICD-10-CM | POA: Diagnosis not present

## 2016-06-11 DIAGNOSIS — N200 Calculus of kidney: Secondary | ICD-10-CM | POA: Diagnosis not present

## 2016-06-11 DIAGNOSIS — R52 Pain, unspecified: Secondary | ICD-10-CM

## 2016-06-12 DIAGNOSIS — R319 Hematuria, unspecified: Secondary | ICD-10-CM | POA: Diagnosis not present

## 2016-06-12 DIAGNOSIS — N390 Urinary tract infection, site not specified: Secondary | ICD-10-CM | POA: Diagnosis not present

## 2016-06-12 DIAGNOSIS — N399 Disorder of urinary system, unspecified: Secondary | ICD-10-CM | POA: Diagnosis not present

## 2016-06-13 DIAGNOSIS — N200 Calculus of kidney: Secondary | ICD-10-CM | POA: Diagnosis not present

## 2016-06-13 DIAGNOSIS — M545 Low back pain: Secondary | ICD-10-CM | POA: Diagnosis not present

## 2016-07-25 DIAGNOSIS — R7989 Other specified abnormal findings of blood chemistry: Secondary | ICD-10-CM | POA: Diagnosis not present

## 2016-08-10 DIAGNOSIS — E119 Type 2 diabetes mellitus without complications: Secondary | ICD-10-CM | POA: Diagnosis not present

## 2016-08-10 DIAGNOSIS — H04123 Dry eye syndrome of bilateral lacrimal glands: Secondary | ICD-10-CM | POA: Diagnosis not present

## 2016-08-10 DIAGNOSIS — H43813 Vitreous degeneration, bilateral: Secondary | ICD-10-CM | POA: Diagnosis not present

## 2016-08-10 DIAGNOSIS — H10413 Chronic giant papillary conjunctivitis, bilateral: Secondary | ICD-10-CM | POA: Diagnosis not present

## 2016-10-01 DIAGNOSIS — I10 Essential (primary) hypertension: Secondary | ICD-10-CM | POA: Diagnosis not present

## 2016-10-01 DIAGNOSIS — E119 Type 2 diabetes mellitus without complications: Secondary | ICD-10-CM | POA: Diagnosis not present

## 2016-10-01 DIAGNOSIS — E1143 Type 2 diabetes mellitus with diabetic autonomic (poly)neuropathy: Secondary | ICD-10-CM | POA: Diagnosis not present

## 2016-10-02 DIAGNOSIS — R7989 Other specified abnormal findings of blood chemistry: Secondary | ICD-10-CM | POA: Diagnosis not present

## 2016-10-02 DIAGNOSIS — K769 Liver disease, unspecified: Secondary | ICD-10-CM | POA: Diagnosis not present

## 2016-10-18 ENCOUNTER — Encounter: Payer: Self-pay | Admitting: Adult Health

## 2016-10-18 ENCOUNTER — Encounter (INDEPENDENT_AMBULATORY_CARE_PROVIDER_SITE_OTHER): Payer: Self-pay

## 2016-10-18 ENCOUNTER — Ambulatory Visit (INDEPENDENT_AMBULATORY_CARE_PROVIDER_SITE_OTHER): Payer: Medicare Other | Admitting: Adult Health

## 2016-10-18 VITALS — BP 124/63 | HR 61 | Wt 197.8 lb

## 2016-10-18 DIAGNOSIS — Z8673 Personal history of transient ischemic attack (TIA), and cerebral infarction without residual deficits: Secondary | ICD-10-CM

## 2016-10-18 NOTE — Progress Notes (Signed)
PATIENT: Abigail Brooks DOB: 03/25/1935  REASON FOR VISIT: follow up- stroke HISTORY FROM: patient  HISTORY OF PRESENT ILLNESS:  Today 10/18/2016:  Abigail Brooks is an 82 year old female with a history of stroke. She returns today for follow-up. She is currently on Plavix and tolerating it well. Her blood pressure is in normal range. Reports her last hemoglobin A1c was 7.1. She is trying to monitor her diet. Cholesterol was in normal range. She denies any additional strokelike symptoms. She is currently residing at [facility]. She uses a wheelchair for longer distances walker for short distances. She denies any falls. She returns today for an evaluation.    Update 04/17/2016 ;  she returns for follow-up after last visit 5 months ago. She is accompanied by her son. Patient is currently living in [facility]. She is able to walk with a walker. She is careful and has had no recent falls. She continues to have mild memory difficulties. She gets confused at times given the names of occasions. At times she sees people are not there. The son feels that her confusion episodes are more related to bladder infections. Overall he feels her memory is gradually getting worse. Patient remains on Plavix for stroke prevention which is tolerating well without bruising or bleeding. Her blood pressure is well controlled and today it is [id number]. Her fasting sugars have have been good and last hemoglobin A1c was 6.6. She remains on Lipitor which is tolerating well without muscle aches or pains. She's had no recurrent stroke or TIA symptoms.  Initial Consult 04/22/2014 : 40 year Caucasian lady who had an episode of transient loss of consciousness and slurred speech and left-sided weakness on 02/21/14. Her son was talking to her on the phone and noticed that she was not responding speaking. He immediately drove to her house about 30 minutes later noticed that she was would respond to him but her speech  was slurred and she had some left-sided weakness. She presented to [facility] She was felt to have had a stroke however MRI scan of the brain revealed no acute infarct. She was also found to have Escherichia coli Unrinary tract infection and treated with antibiotics. She had a history of recurrent bladder infections with confusion and a sensation following these in the past. She also had history of multiple falls. She had history of severe lumbar spinal stenosis with neurogenic claudication and had undergone prior surgery twice. The rest of her stroke workup was unremarkable including transthoracic echo which showed ejection fraction of 55-60% without any cardiac source of embolism. Carotid Doppler showed no significant extracranial stenosis. Lipid profile is significant for elevated LDL cholesterol of 105 mg percent and elevated triglycerides for which she was started on fenofibrate and Lipitor. Hemoglobin A1c was 6.4. Patient had previous been on aspirin which which was switched to Plavix. She was in consultation by Dr. Thad Ranger not by stroke team.. EEG was not done.Son is unable to tell me if she has been tried on medications like Aricept or Namenda in the past. She however has had improvement in her mental status and is back to baseline. She has no residual weakness. She was walking with a cane with multiple falls and recently she has been switched to a walker. She is currently getting a home physical therapy were working with advocating her to use a walker correctly. She yet has had 2 falls this week already. She has no known prior history of strokes TIAs seizures. It  is unclear to me whether she's had dementia workup in the past or treatment..Behavioural disturbance was documented in her chart and she was advised to stay on Seroquel.  REVIEW OF SYSTEMS: Out of a complete 14 system review of symptoms, the patient complains only of the following symptoms, and all other reviewed systems are  negative.  Frequency of urination, urgency, joint pain, back pain  ALLERGIES: Allergies  Allergen Reactions  . Morphine And Related Other (See Comments)    Makes her crazy  . Enalapril Swelling    Facial swelling    HOME MEDICATIONS: Outpatient Medications Prior to Visit  Medication Sig Dispense Refill  . atorvastatin (LIPITOR) 10 MG tablet TAKE 1 TABLET BY MOUTH EVERY DAY AT 6PM 90 tablet 1  . buPROPion (WELLBUTRIN SR) 100 MG 12 hr tablet Take 1 tablet (100 mg total) by mouth daily. 60 tablet 1  . Calcium Carbonate-Vitamin D (CALCIUM + D PO) Take 1 tablet by mouth daily. Calcium 315 mg,Vitamin D3 250 I.U.    . clonazePAM (KLONOPIN) 0.5 MG tablet Take 0.5 mg by mouth daily.     . clopidogrel (PLAVIX) 75 MG tablet TAKE 1 TABLET BY MOUTH EVERY DAY 90 tablet 1  . fenofibrate 54 MG tablet Take 1 tablet (54 mg total) by mouth daily. 90 tablet 1  . ferrous sulfate 325 (65 FE) MG tablet TAKE 1 TABLET BY MOUTH EVERY MORNING WITH BREAKFAST 90 tablet 1  . fesoterodine (TOVIAZ) 4 MG TB24 tablet Take 4 mg by mouth daily.    . fish oil-omega-3 fatty acids 1000 MG capsule Take 1 g by mouth daily.     . furosemide (LASIX) 20 MG tablet Take 20 mg by mouth. Three times per week.    . hydrALAZINE (APRESOLINE) 25 MG tablet Take 25 mg by mouth 3 (three) times daily.    . Hypromellose (ARTIFICIAL TEARS OP) Apply 1 Dose to eye daily.    . insulin glargine (LANTUS) 100 UNIT/ML injection Inject 36 Units into the skin at bedtime.     Marland Kitchen losartan-hydrochlorothiazide (HYZAAR) 50-12.5 MG per tablet Take 1 tablet by mouth daily.  1  . metFORMIN (GLUCOPHAGE) 500 MG tablet Take 1 tablet (500 mg total) by mouth daily with breakfast. 90 tablet 3  . metoprolol tartrate (LOPRESSOR) 25 MG tablet Take 25 mg by mouth 2 (two) times daily.    . Multiple Vitamin (MULTIVITAMIN) tablet Take 1 tablet by mouth daily.    . nitroGLYCERIN (NITROSTAT) 0.4 MG SL tablet Place 0.4 mg under the tongue every 5 (five) minutes as needed  for chest pain. Reported on 10/12/2015    . NOVOLOG FLEXPEN 100 UNIT/ML FlexPen Inject into the skin 2 (two) times daily. SSI- 151-200=4 units 201-250=6 units 251-300=8 units 301-350=10 units 351-400=12 units    . NYSTATIN powder     . oxyCODONE-acetaminophen (PERCOCET/ROXICET) 5-325 MG per tablet Take 1 tablet by mouth every 8 (eight) hours as needed.   0  . QUEtiapine (SEROQUEL) 25 MG tablet TAKE 1 TABLET BY MOUTH EVERY MORNING & 2 TABLETS IN THE EVENING 270 tablet 1  . senna-docusate (SENNA S) 8.6-50 MG per tablet Take 1 tablet by mouth 2 (two) times daily.    Marland Kitchen UNABLE TO FIND UTI for UTIs liquid oral give 30ml PO QD     No facility-administered medications prior to visit.     PAST MEDICAL HISTORY: Past Medical History:  Diagnosis Date  . Abnormality of gait   . Anemia   . Anxiety state, unspecified   .  Arthritis   . Blood transfusion    when had hysterectomy  . Blood transfusion without reported diagnosis   . Cataract   . Complications affecting other specified body systems, hypertension   . Congestive heart failure (HCC)    2012  . Coronary artery disease    stents x 2, sees Dr. Halina AndreasSmith, [facility]  . Depression   . Diabetes mellitus   . Dysuria   . GERD (gastroesophageal reflux disease)   . H/O hiatal hernia   . Hypertension   . Hypothyroidism    has had hx of treatment  . Myocardial infarction    "unsure of when"  . Other and unspecified hyperlipidemia   . Other B-complex deficiencies   . Other malaise and fatigue   . Shortness of breath   . Spinal stenosis    with bone spurs and ruptured disc  . Stroke (HCC)   . Unspecified vitamin D deficiency   . Urinary frequency   . Urinary tract infection    hx of    PAST SURGICAL HISTORY: Past Surgical History:  Procedure Laterality Date  . ABDOMINAL HYSTERECTOMY  1986  . APPENDECTOMY    . BACK SURGERY     ruptured disc, lumbar area  . BLADDER SUSPENSION    . CARDIAC CATHETERIZATION    . CATARACT EXTRACTION W/  INTRAOCULAR LENS  IMPLANT, BILATERAL    . coronary     stents x 2, stent implant placed9/21/06  . EYE SURGERY    . LUMBAR LAMINECTOMY/DECOMPRESSION MICRODISCECTOMY  12/21/2011   Procedure: LUMBAR LAMINECTOMY/DECOMPRESSION MICRODISCECTOMY 1 LEVEL;  Surgeon: Temple PaciniHenry A Pool, MD;  Location: [facility];  Service: Neurosurgery;  Laterality: Bilateral;  Lumbar four-five decompressive lumbar laminectomy; Right Lumbar four-five microdiscectomy  . SPINE SURGERY      FAMILY HISTORY: Family History  Problem Relation Age of Onset  . Anesthesia problems Sister   . Stroke Sister   . Anesthesia problems Son   . Thyroid cancer Son   . Heart disease Mother   . Stroke Mother   . Diabetes Sister     SOCIAL HISTORY: Social History   Social History  . Marital status: Divorced    Spouse name: N/A  . Number of children: 3  . Years of education: 8th   Occupational History  . retired Other   Social History Main Topics  . Smoking status: Former Smoker    Types: Cigarettes  . Smokeless tobacco: Current User    Types: Snuff     Comment: 40 years ago  . Alcohol use No  . Drug use: No  . Sexual activity: No   Other Topics Concern  . Not on file   Social History Narrative   [facility]    Patient is right handed.   Caffeine use: none      PHYSICAL EXAM  Vitals:   10/18/16 1516  BP: 124/63  Pulse: 61  Weight: 197 lb 12.8 oz (89.7 kg)   Body mass index is 39.95 kg/m.  Generalized: Well developed, in no acute distress   Neurological examination  Mentation: Alert oriented to time, place, history taking. Follows all commands speech and language fluent Cranial nerve II-XII: Pupils were equal round reactive to light. Extraocular movements were full, visual field were full on confrontational test. Facial sensation and strength were normal. Uvula tongue midline. Head turning and shoulder shrug  were normal and symmetric. Motor: The motor testing reveals  5 over 5 strength of  all 4 extremities. Good symmetric motor tone is noted throughout.  Sensory: Sensory testing is intact to soft touch on all 4 extremities. No evidence of extinction is noted.  Coordination: Cerebellar testing reveals good finger-nose-finger and heel-to-shin bilaterally.  Gait and station: Patient is in a wheelchair. Reflexes: Deep tendon reflexes are symmetric and normal bilaterally.   DIAGNOSTIC DATA (LABS, IMAGING, TESTING) - I reviewed patient records, labs, notes, testing and imaging myself where available.  Lab Results  Component Value Date   WBC 8.4 11/14/2014   HGB 13.3 11/14/2014   HCT 39.0 11/14/2014   MCV 88.9 11/14/2014   PLT 187 11/14/2014      Component Value Date/Time   NA 141 11/18/2014 0613   NA 140 08/10/2014 1728   K 3.8 11/18/2014 0613   CL 108 11/18/2014 0613   CO2 26 11/18/2014 0613   GLUCOSE 175 (H) 11/18/2014 0613   BUN 20 11/18/2014 0613   BUN 23 08/10/2014 1728   CREATININE 1.07 11/18/2014 0613   CALCIUM 9.1 11/18/2014 0613   PROT 6.2 11/14/2014 1053   PROT 6.7 08/10/2014 1728   ALBUMIN 3.4 (L) 11/14/2014 1053   ALBUMIN 4.4 08/10/2014 1728   AST 45 (H) 11/14/2014 1053   ALT 28 11/14/2014 1053   ALKPHOS 43 11/14/2014 1053   BILITOT 0.6 11/14/2014 1053   GFRNONAA 48 (L) 11/18/2014 0613   GFRAA 56 (L) 11/18/2014 0613   Lab Results  Component Value Date   CHOL 176 11/15/2014   HDL 30 (L) 11/15/2014   LDLCALC 84 11/15/2014   TRIG 312 (H) 11/15/2014   CHOLHDL 5.9 11/15/2014   Lab Results  Component Value Date   HGBA1C 7.3 (H) 11/15/2014   Lab Results  Component Value Date   VITAMINB12 321 10/21/2013   Lab Results  Component Value Date   TSH 1.94 04/28/2014      ASSESSMENT AND PLAN 82 y.o. year old female  has a past medical history of Abnormality of gait; Anemia; Anxiety state, unspecified; Arthritis; Blood transfusion; Blood transfusion without reported diagnosis; Cataract; Complications affecting other specified  body systems, hypertension; Congestive heart failure (HCC); Coronary artery disease; Depression; Diabetes mellitus; Dysuria; GERD (gastroesophageal reflux disease); H/O hiatal hernia; Hypertension; Hypothyroidism; Myocardial infarction; Other and unspecified hyperlipidemia; Other B-complex deficiencies; Other malaise and fatigue; Shortness of breath; Spinal stenosis; Stroke [facility]); Unspecified vitamin D deficiency; Urinary frequency; and Urinary tract infection. here with:  1. History of stroke   Overall the patient has remained stable. She will continue to monitor her blood pressure goal less than 130/90, cholesterol LDL less than 70 and hemoglobin A1c less than 6.5%. She will continue on Plavix for stroke prevention. Advised that if her symptoms worsen or she develops new symptoms she should let us know. She will follow-up in 6 months or sooner if needed.  I spent 15 minutes with the patient 50% of this time was spent reviewing stroke risk factors.     Butch Penny, MSN, NP-C 10/18/2016, 3:13 PM [facility] [address] [place], [place] [address] [phone]

## 2016-10-18 NOTE — Patient Instructions (Signed)
Continue Plavix BP <130/90 Cholesterol LDL <100 HgbA1c <6.5 % If your symptoms worsen or you develop new symptoms please let us know.

## 2016-10-19 NOTE — Progress Notes (Signed)
I agree with the above plan 

## 2017-01-01 DIAGNOSIS — M79605 Pain in left leg: Secondary | ICD-10-CM | POA: Diagnosis not present

## 2017-01-27 DIAGNOSIS — R05 Cough: Secondary | ICD-10-CM | POA: Diagnosis not present

## 2017-03-16 DIAGNOSIS — M25561 Pain in right knee: Secondary | ICD-10-CM | POA: Diagnosis not present

## 2017-04-24 ENCOUNTER — Encounter: Payer: Self-pay | Admitting: Adult Health

## 2017-04-24 ENCOUNTER — Ambulatory Visit (INDEPENDENT_AMBULATORY_CARE_PROVIDER_SITE_OTHER): Payer: Medicare Other | Admitting: Adult Health

## 2017-04-24 VITALS — BP 142/83 | HR 77 | Wt 196.4 lb

## 2017-04-24 DIAGNOSIS — Z8673 Personal history of transient ischemic attack (TIA), and cerebral infarction without residual deficits: Secondary | ICD-10-CM

## 2017-04-24 NOTE — Patient Instructions (Signed)
Your Plan:  Continue plavix  Keep BP <130/90 Cholesterol LDL < 70 HgA1c <6.5 % If your symptoms worsen or you develop new symptoms please let us know.    Thank you for [facility] at [facility]. I hope we have been able to provide you high quality care today.  You may receive a patient satisfaction survey over the next few weeks. We would appreciate your feedback and comments so that we may continue to improve ourselves and the health of our patients.

## 2017-04-24 NOTE — Progress Notes (Signed)
I reviewed note and agree with plan.   Suanne MarkerVIKRAM R. Myna Freimark, MD 04/24/2017, 4:28 PM Certified in Neurology, Neurophysiology and Neuroimaging  [facility] [address], [address] [place], [place] [address] [phone]

## 2017-04-24 NOTE — Progress Notes (Signed)
PATIENT: Abigail Brooks DOB: 1935-02-17  REASON FOR VISIT: follow up- stroke HISTORY FROM: patient  HISTORY OF PRESENT ILLNESS:  Today 04/24/17:  Abigail Brooks is an 82 year old female with a history of stroke. She returns today for follow-up. She continues to reside at [facility]. According to her medically she has continued on Plavix. She denies any significant bruising or bleeding. Blood pressure today is slightly elevated. They do monitor it at [facility]. Her last hemoglobin A1c was checked in December and was 7.1. Her primary care at the facility is managing her blood pressure, cholesterol and diabetes. Patient denies any strokelike symptoms. She denies any new neurological symptoms. She returns today for an evaluation.   10/18/2016:  Abigail Brooks is an 82 year old female with a history of stroke. She returns today for follow-up. She is currently on Plavix and tolerating it well. Her blood pressure is in normal range. Reports her last hemoglobin A1c was 7.1. She is trying to monitor her diet. Cholesterol was in normal range. She denies any additional strokelike symptoms. She is currently residing at [facility]. She uses a wheelchair for longer distances walker for short distances. She denies any falls. She returns today for an evaluation.    Update 04/17/2016; she returns for follow-up after last visit 5 months ago. She is accompanied by her son. Patient is currently living in [facility] skilled [facility] facility. She is able to walk with a walker. She is careful and has had no recent falls. She continues to have mild memory difficulties. She gets confused at times given the names of occasions. At times she sees people are not there. The son feels that her confusion episodes are more related to bladder infections. Overall he feels her memory is gradually getting worse. Patient remains on Plavix for stroke prevention which is tolerating well without bruising or bleeding. Her blood pressure  is well controlled and today it is [id number]. Her fasting sugars have have been good and last hemoglobin A1c was 6.6. She remains on Lipitor which is tolerating well without muscle aches or pains. She's had no recurrent stroke or TIA symptoms.  Initial Consult 04/22/2014 : 64 year Caucasian lady who had an episode of transient loss of consciousness and slurred speech and left-sided weakness on 02/21/14. Her son was talking to her on the phone and noticed that she was not responding speaking. He immediately drove to her house about 30 minutes later noticed that she was would respond to him but her speech was slurred and she had some left-sided weakness. She presented to [facility]. She was felt to have had a stroke however MRI scan of the brain revealed no acute infarct. She was also found to have Escherichia coli Unrinary tract infection and treated with antibiotics. She had a history of recurrent bladder infections with confusion and a sensation following these in the past. She also had history of multiple falls. She had history of severe lumbar spinal stenosis with neurogenic claudication and had undergone prior surgery twice. The rest of her stroke workup was unremarkable including transthoracic echo which showed ejection fraction of 55-60% without any cardiac source of embolism. Carotid Doppler showed no significant extracranial stenosis. Lipid profile is significant for elevated LDL cholesterol of 105 mg percent and elevated triglycerides for which she was started on fenofibrate and Lipitor. Hemoglobin A1c was 6.4. Patient had previous been on aspirin which which was switched to Plavix. She was in consultation by Dr. Thad Ranger not by stroke team.. EEG was not done.Son  is unable to tell me if she has been tried on medications like Aricept or Namenda in the past. She however has had improvement in her mental status and is back to baseline. She has no residual weakness. She was walking with a cane with  multiple falls and recently she has been switched to a walker. She is currently getting a home physical therapy were working with advocating her to use a walker correctly. She yet has had 2 falls this week already. She has no known prior history of strokes TIAs seizures. It is unclear to me whether she's had dementia workup in the past or treatment..Behavioural disturbance was documented in her chart and she was advised to stay on Seroquel.   REVIEW OF SYSTEMS: Out of a complete 14 system review of symptoms, the patient complains only of the following symptoms, and all other reviewed systems are negative.  Incontinence of bladder, joint pain, walking difficulty, depression, sleep talking, constipation, hearing loss  ALLERGIES: Allergies  Allergen Reactions  . Morphine And Related Other (See Comments)    Makes her crazy  . Enalapril Swelling    Facial swelling    HOME MEDICATIONS: Outpatient Medications Prior to Visit  Medication Sig Dispense Refill  . atorvastatin (LIPITOR) 10 MG tablet TAKE 1 TABLET BY MOUTH EVERY DAY AT 6PM 90 tablet 1  . buPROPion (WELLBUTRIN SR) 100 MG 12 hr tablet Take 1 tablet (100 mg total) by mouth daily. 60 tablet 1  . Calcium Carbonate-Vitamin D (CALCIUM + D PO) Take 1 tablet by mouth daily. Calcium 315 mg,Vitamin D3 250 I.U.    . clonazePAM (KLONOPIN) 0.5 MG tablet Take 0.5 mg by mouth daily.     . clopidogrel (PLAVIX) 75 MG tablet TAKE 1 TABLET BY MOUTH EVERY DAY 90 tablet 1  . fenofibrate 54 MG tablet Take 1 tablet (54 mg total) by mouth daily. 90 tablet 1  . fesoterodine (TOVIAZ) 4 MG TB24 tablet Take 4 mg by mouth daily.    . fish oil-omega-3 fatty acids 1000 MG capsule Take 1 g by mouth daily.     . furosemide (LASIX) 20 MG tablet Take 20 mg by mouth. Three times per week.    . hydrALAZINE (APRESOLINE) 25 MG tablet Take 25 mg by mouth 3 (three) times daily.    . Hypromellose (ARTIFICIAL TEARS OP) Apply 1 Dose to eye daily.    . insulin glargine  (LANTUS) 100 UNIT/ML injection Inject 36 Units into the skin at bedtime.     Marland Kitchen losartan-hydrochlorothiazide (HYZAAR) 50-12.5 MG per tablet Take 1 tablet by mouth daily.  1  . metFORMIN (GLUCOPHAGE) 500 MG tablet Take 1 tablet (500 mg total) by mouth daily with breakfast. 90 tablet 3  . metoprolol tartrate (LOPRESSOR) 25 MG tablet Take 25 mg by mouth 2 (two) times daily.    . Multiple Vitamin (MULTIVITAMIN) tablet Take 1 tablet by mouth daily.    . nitroGLYCERIN (NITROSTAT) 0.4 MG SL tablet Place 0.4 mg under the tongue every 5 (five) minutes as needed for chest pain. Reported on 10/12/2015    . NOVOLOG FLEXPEN 100 UNIT/ML FlexPen Inject into the skin 2 (two) times daily. SSI- 151-200=4 units 201-250=6 units 251-300=8 units 301-350=10 units 351-400=12 units    . oxyCODONE-acetaminophen (PERCOCET/ROXICET) 5-325 MG per tablet Take 1 tablet by mouth every 8 (eight) hours as needed.   0  . senna-docusate (SENNA S) 8.6-50 MG per tablet Take 1 tablet by mouth 2 (two) times daily.    Marland Kitchen trimethoprim (  TRIMPEX) 100 MG tablet 100 mg daily.    Marland Kitchen UNABLE TO FIND UTI for UTIs liquid oral give 30ml PO QD    . ferrous sulfate 325 (65 FE) MG tablet TAKE 1 TABLET BY MOUTH EVERY MORNING WITH BREAKFAST (Patient not taking: Reported on 04/24/2017) 90 tablet 1  . NYSTATIN powder     . OPANA ER, CRUSH RESISTANT, 7.5 MG T12A 7.5 mg daily.    . QUEtiapine (SEROQUEL) 25 MG tablet TAKE 1 TABLET BY MOUTH EVERY MORNING & 2 TABLETS IN THE EVENING (Patient not taking: Reported on 10/18/2016) 270 tablet 1  . traZODone (DESYREL) 50 MG tablet 50 mg as needed. Son unsure if she is taking, not on nursing home [facility]     No facility-administered medications prior to visit.     PAST MEDICAL HISTORY: Past Medical History:  Diagnosis Date  . Abnormality of gait   . Anemia   . Anxiety state, unspecified   . Arthritis   . Blood transfusion    when had hysterectomy  . Blood transfusion without reported diagnosis   . Cataract   .  Complications affecting other specified body systems, hypertension   . Congestive heart failure (HCC)    2012  . Coronary artery disease    stents x 2, sees Dr. Halina Andreas practice  . Depression   . Diabetes mellitus   . Dysuria   . GERD (gastroesophageal reflux disease)   . H/O hiatal hernia   . Hypertension   . Hypothyroidism    has had hx of treatment  . Myocardial infarction [facility])    "unsure of when"  . Other and unspecified hyperlipidemia   . Other B-complex deficiencies   . Other malaise and fatigue   . Shortness of breath   . Spinal stenosis    with bone spurs and ruptured disc  . Stroke (HCC)   . Unspecified vitamin D deficiency   . Urinary frequency   . Urinary tract infection    hx of    PAST SURGICAL HISTORY: Past Surgical History:  Procedure Laterality Date  . ABDOMINAL HYSTERECTOMY  1986  . APPENDECTOMY    . BACK SURGERY     ruptured disc, lumbar area  . BLADDER SUSPENSION    . CARDIAC CATHETERIZATION    . CATARACT EXTRACTION W/ INTRAOCULAR LENS  IMPLANT, BILATERAL    . coronary     stents x 2, stent implant placed9/21/06  . EYE SURGERY    . LUMBAR LAMINECTOMY/DECOMPRESSION MICRODISCECTOMY  12/21/2011   Procedure: LUMBAR LAMINECTOMY/DECOMPRESSION MICRODISCECTOMY 1 LEVEL;  Surgeon: Temple Pacini, MD;  Location: [facility];  Service: Neurosurgery;  Laterality: Bilateral;  Lumbar four-five decompressive lumbar laminectomy; Right Lumbar four-five microdiscectomy  . SPINE SURGERY      FAMILY HISTORY: Family History  Problem Relation Age of Onset  . Anesthesia problems Sister   . Stroke Sister   . Anesthesia problems Son   . Thyroid cancer Son   . Heart disease Mother   . Stroke Mother   . Diabetes Sister     SOCIAL HISTORY: Social History   Social History  . Marital status: Divorced    Spouse name: N/A  . Number of children: 3  . Years of education: 8th   Occupational History  . retired Other   Social History Main Topics  . Smoking  status: Former Smoker    Types: Cigarettes  . Smokeless tobacco: Current User    Types: Snuff     Comment: 40  years ago  . Alcohol use No  . Drug use: No  . Sexual activity: No   Other Topics Concern  . Not on file   Social History Narrative   [facility]    Patient is right handed.   Caffeine use: none      PHYSICAL EXAM  Vitals:   04/24/17 1452  BP: (!) 142/83  Pulse: 77  Weight: 196 lb 6.4 oz (89.1 kg)   Body mass index is 39.67 kg/m.  Generalized: Well developed, in no acute distress   Neurological examination  Mentation: Alert oriented to time, place, history taking. Follows all commands speech and language fluent Cranial nerve II-XII: Pupils were equal round reactive to light. Extraocular movements were full, visual field were full on confrontational test. Facial sensation and strength were normal. Uvula tongue midline. Head turning and shoulder shrug  were normal and symmetric. Motor: The motor testing reveals 5 over 5 strength of all 4 extremities. Good symmetric motor tone is noted throughout.  Sensory: Sensory testing is intact to soft touch on all 4 extremities. No evidence of extinction is noted.  Coordination: Cerebellar testing reveals good finger-nose-finger and heel-to-shin bilaterally.  Gait and station: patient is in a wheelchair Reflexes: Deep tendon reflexes are symmetric and normal bilaterally.   DIAGNOSTIC DATA (LABS, IMAGING, TESTING) - I reviewed patient records, labs, notes, testing and imaging myself where available.  Lab Results  Component Value Date   WBC 8.4 11/14/2014   HGB 13.3 11/14/2014   HCT 39.0 11/14/2014   MCV 88.9 11/14/2014   PLT 187 11/14/2014      Component Value Date/Time   NA 141 11/18/2014 0613   NA 140 08/10/2014 1728   K 3.8 11/18/2014 0613   CL 108 11/18/2014 0613   CO2 26 11/18/2014 0613   GLUCOSE 175 (H) 11/18/2014 0613   BUN 20 11/18/2014 0613   BUN 23 08/10/2014 1728    CREATININE 1.07 11/18/2014 0613   CALCIUM 9.1 11/18/2014 0613   PROT 6.2 11/14/2014 1053   PROT 6.7 08/10/2014 1728   ALBUMIN 3.4 (L) 11/14/2014 1053   ALBUMIN 4.4 08/10/2014 1728   AST 45 (H) 11/14/2014 1053   ALT 28 11/14/2014 1053   ALKPHOS 43 11/14/2014 1053   BILITOT 0.6 11/14/2014 1053   GFRNONAA 48 (L) 11/18/2014 0613   GFRAA 56 (L) 11/18/2014 0613   Lab Results  Component Value Date   CHOL 176 11/15/2014   HDL 30 (L) 11/15/2014   LDLCALC 84 11/15/2014   TRIG 312 (H) 11/15/2014   CHOLHDL 5.9 11/15/2014   Lab Results  Component Value Date   HGBA1C 7.3 (H) 11/15/2014   Lab Results  Component Value Date   VITAMINB12 321 10/21/2013   Lab Results  Component Value Date   TSH 1.94 04/28/2014      ASSESSMENT AND PLAN 82 y.o. year old female  has a past medical history of Abnormality of gait; Anemia; Anxiety state, unspecified; Arthritis; Blood transfusion; Blood transfusion without reported diagnosis; Cataract; Complications affecting other specified body systems, hypertension; Congestive heart failure (HCC); Coronary artery disease; Depression; Diabetes mellitus; Dysuria; GERD (gastroesophageal reflux disease); H/O hiatal hernia; Hypertension; Hypothyroidism; Myocardial infarction [facility](HCC); Other and unspecified hyperlipidemia; Other B-complex deficiencies; Other malaise and fatigue; Shortness of breath; Spinal stenosis; Stroke [facility](HCC); Unspecified vitamin D deficiency; Urinary frequency; and Urinary tract infection. here with:  1. History of cva  Overall the patient is doing well. Her stroke was in 2015. She is  doing well over the last 3 years. She is encouraged to continue using Plavix for stroke prevention. Continue to monitor her blood pressure with goal less than 130/90, cholesterol LDL less than 70 and hemoglobin A1c less than 6.5%. She is advised that if her symptoms worsen or she develops new symptoms she shouldlet [place] know. She will follow-up if needed.  I spent 15  minutes with the patient. 50% of this time was spent discussing stroke risk factors  Butch Penny, MSN, NP-C 04/24/2017, 3:14 PM [facility] [address] [place], [place] [address] [phone]

## 2017-05-17 DIAGNOSIS — I2510 Atherosclerotic heart disease of native coronary artery without angina pectoris: Secondary | ICD-10-CM | POA: Diagnosis not present

## 2017-05-17 DIAGNOSIS — M150 Primary generalized (osteo)arthritis: Secondary | ICD-10-CM | POA: Diagnosis not present

## 2017-05-17 DIAGNOSIS — E785 Hyperlipidemia, unspecified: Secondary | ICD-10-CM | POA: Diagnosis not present

## 2017-05-17 DIAGNOSIS — N3281 Overactive bladder: Secondary | ICD-10-CM | POA: Diagnosis not present

## 2017-05-17 DIAGNOSIS — I509 Heart failure, unspecified: Secondary | ICD-10-CM | POA: Diagnosis not present

## 2017-05-17 DIAGNOSIS — E119 Type 2 diabetes mellitus without complications: Secondary | ICD-10-CM | POA: Diagnosis not present

## 2017-05-17 DIAGNOSIS — K5900 Constipation, unspecified: Secondary | ICD-10-CM | POA: Diagnosis not present

## 2017-05-20 DIAGNOSIS — E119 Type 2 diabetes mellitus without complications: Secondary | ICD-10-CM | POA: Diagnosis not present

## 2017-06-05 DIAGNOSIS — I10 Essential (primary) hypertension: Secondary | ICD-10-CM | POA: Diagnosis not present

## 2017-06-05 DIAGNOSIS — R54 Age-related physical debility: Secondary | ICD-10-CM | POA: Diagnosis not present

## 2017-06-05 DIAGNOSIS — E119 Type 2 diabetes mellitus without complications: Secondary | ICD-10-CM | POA: Diagnosis not present

## 2017-06-05 DIAGNOSIS — M150 Primary generalized (osteo)arthritis: Secondary | ICD-10-CM | POA: Diagnosis not present

## 2017-06-05 DIAGNOSIS — I2510 Atherosclerotic heart disease of native coronary artery without angina pectoris: Secondary | ICD-10-CM | POA: Diagnosis not present

## 2017-06-05 DIAGNOSIS — E785 Hyperlipidemia, unspecified: Secondary | ICD-10-CM | POA: Diagnosis not present

## 2017-06-05 DIAGNOSIS — I639 Cerebral infarction, unspecified: Secondary | ICD-10-CM | POA: Diagnosis not present

## 2017-06-05 DIAGNOSIS — K5900 Constipation, unspecified: Secondary | ICD-10-CM | POA: Diagnosis not present

## 2017-06-05 DIAGNOSIS — R079 Chest pain, unspecified: Secondary | ICD-10-CM | POA: Diagnosis not present

## 2017-06-05 DIAGNOSIS — G909 Disorder of the autonomic nervous system, unspecified: Secondary | ICD-10-CM | POA: Diagnosis not present

## 2017-06-05 DIAGNOSIS — I5032 Chronic diastolic (congestive) heart failure: Secondary | ICD-10-CM | POA: Diagnosis not present

## 2017-06-05 DIAGNOSIS — I509 Heart failure, unspecified: Secondary | ICD-10-CM | POA: Diagnosis not present

## 2017-06-20 DIAGNOSIS — E785 Hyperlipidemia, unspecified: Secondary | ICD-10-CM | POA: Diagnosis not present

## 2017-06-20 DIAGNOSIS — M6281 Muscle weakness (generalized): Secondary | ICD-10-CM | POA: Diagnosis not present

## 2017-06-20 DIAGNOSIS — K5900 Constipation, unspecified: Secondary | ICD-10-CM | POA: Diagnosis not present

## 2017-06-20 DIAGNOSIS — E119 Type 2 diabetes mellitus without complications: Secondary | ICD-10-CM | POA: Diagnosis not present

## 2017-06-20 DIAGNOSIS — I2510 Atherosclerotic heart disease of native coronary artery without angina pectoris: Secondary | ICD-10-CM | POA: Diagnosis not present

## 2017-06-20 DIAGNOSIS — G909 Disorder of the autonomic nervous system, unspecified: Secondary | ICD-10-CM | POA: Diagnosis not present

## 2017-06-20 DIAGNOSIS — I639 Cerebral infarction, unspecified: Secondary | ICD-10-CM | POA: Diagnosis not present

## 2017-06-20 DIAGNOSIS — I5032 Chronic diastolic (congestive) heart failure: Secondary | ICD-10-CM | POA: Diagnosis not present

## 2017-06-20 DIAGNOSIS — I10 Essential (primary) hypertension: Secondary | ICD-10-CM | POA: Diagnosis not present

## 2017-06-20 DIAGNOSIS — I509 Heart failure, unspecified: Secondary | ICD-10-CM | POA: Diagnosis not present

## 2017-06-20 DIAGNOSIS — M150 Primary generalized (osteo)arthritis: Secondary | ICD-10-CM | POA: Diagnosis not present

## 2017-06-20 DIAGNOSIS — R079 Chest pain, unspecified: Secondary | ICD-10-CM | POA: Diagnosis not present

## 2017-06-21 DIAGNOSIS — Z79899 Other long term (current) drug therapy: Secondary | ICD-10-CM | POA: Diagnosis not present

## 2017-06-21 DIAGNOSIS — D649 Anemia, unspecified: Secondary | ICD-10-CM | POA: Diagnosis not present

## 2017-06-27 DIAGNOSIS — M150 Primary generalized (osteo)arthritis: Secondary | ICD-10-CM | POA: Diagnosis not present

## 2017-06-27 DIAGNOSIS — G894 Chronic pain syndrome: Secondary | ICD-10-CM | POA: Diagnosis not present

## 2017-06-28 DIAGNOSIS — R21 Rash and other nonspecific skin eruption: Secondary | ICD-10-CM | POA: Diagnosis not present

## 2017-07-04 DIAGNOSIS — R21 Rash and other nonspecific skin eruption: Secondary | ICD-10-CM | POA: Diagnosis not present

## 2017-07-22 DIAGNOSIS — R079 Chest pain, unspecified: Secondary | ICD-10-CM | POA: Diagnosis not present

## 2017-07-22 DIAGNOSIS — I2510 Atherosclerotic heart disease of native coronary artery without angina pectoris: Secondary | ICD-10-CM | POA: Diagnosis not present

## 2017-07-22 DIAGNOSIS — Z87440 Personal history of urinary (tract) infections: Secondary | ICD-10-CM | POA: Diagnosis not present

## 2017-07-22 DIAGNOSIS — E785 Hyperlipidemia, unspecified: Secondary | ICD-10-CM | POA: Diagnosis not present

## 2017-07-22 DIAGNOSIS — I5032 Chronic diastolic (congestive) heart failure: Secondary | ICD-10-CM | POA: Diagnosis not present

## 2017-07-22 DIAGNOSIS — E119 Type 2 diabetes mellitus without complications: Secondary | ICD-10-CM | POA: Diagnosis not present

## 2017-07-22 DIAGNOSIS — I10 Essential (primary) hypertension: Secondary | ICD-10-CM | POA: Diagnosis not present

## 2017-07-22 DIAGNOSIS — K5900 Constipation, unspecified: Secondary | ICD-10-CM | POA: Diagnosis not present

## 2017-07-22 DIAGNOSIS — M150 Primary generalized (osteo)arthritis: Secondary | ICD-10-CM | POA: Diagnosis not present

## 2017-07-22 DIAGNOSIS — G909 Disorder of the autonomic nervous system, unspecified: Secondary | ICD-10-CM | POA: Diagnosis not present

## 2017-07-22 DIAGNOSIS — N329 Bladder disorder, unspecified: Secondary | ICD-10-CM | POA: Diagnosis not present

## 2017-07-22 DIAGNOSIS — M6281 Muscle weakness (generalized): Secondary | ICD-10-CM | POA: Diagnosis not present

## 2017-07-30 DIAGNOSIS — G894 Chronic pain syndrome: Secondary | ICD-10-CM | POA: Diagnosis not present

## 2017-07-30 DIAGNOSIS — M1990 Unspecified osteoarthritis, unspecified site: Secondary | ICD-10-CM | POA: Diagnosis not present

## 2017-08-05 DIAGNOSIS — R079 Chest pain, unspecified: Secondary | ICD-10-CM | POA: Diagnosis not present

## 2017-08-05 DIAGNOSIS — I10 Essential (primary) hypertension: Secondary | ICD-10-CM | POA: Diagnosis not present

## 2017-08-05 DIAGNOSIS — E785 Hyperlipidemia, unspecified: Secondary | ICD-10-CM | POA: Diagnosis not present

## 2017-08-05 DIAGNOSIS — E119 Type 2 diabetes mellitus without complications: Secondary | ICD-10-CM | POA: Diagnosis not present

## 2017-08-05 DIAGNOSIS — M150 Primary generalized (osteo)arthritis: Secondary | ICD-10-CM | POA: Diagnosis not present

## 2017-08-05 DIAGNOSIS — N329 Bladder disorder, unspecified: Secondary | ICD-10-CM | POA: Diagnosis not present

## 2017-08-05 DIAGNOSIS — K5900 Constipation, unspecified: Secondary | ICD-10-CM | POA: Diagnosis not present

## 2017-08-05 DIAGNOSIS — I2510 Atherosclerotic heart disease of native coronary artery without angina pectoris: Secondary | ICD-10-CM | POA: Diagnosis not present

## 2017-08-05 DIAGNOSIS — M6281 Muscle weakness (generalized): Secondary | ICD-10-CM | POA: Diagnosis not present

## 2017-08-05 DIAGNOSIS — I5032 Chronic diastolic (congestive) heart failure: Secondary | ICD-10-CM | POA: Diagnosis not present

## 2017-08-05 DIAGNOSIS — G909 Disorder of the autonomic nervous system, unspecified: Secondary | ICD-10-CM | POA: Diagnosis not present

## 2017-08-05 DIAGNOSIS — Z87440 Personal history of urinary (tract) infections: Secondary | ICD-10-CM | POA: Diagnosis not present

## 2017-08-14 ENCOUNTER — Encounter (HOSPITAL_COMMUNITY): Payer: Self-pay | Admitting: Emergency Medicine

## 2017-08-14 ENCOUNTER — Inpatient Hospital Stay (HOSPITAL_COMMUNITY)
Admission: EM | Admit: 2017-08-14 | Discharge: 2017-08-17 | DRG: 391 | Disposition: A | Discharge status: Skilled Nursing Facility | Payer: Medicare Other | Attending: Internal Medicine | Admitting: Internal Medicine

## 2017-08-14 ENCOUNTER — Emergency Department (HOSPITAL_COMMUNITY): Payer: Medicare Other

## 2017-08-14 DIAGNOSIS — E860 Dehydration: Secondary | ICD-10-CM | POA: Diagnosis present

## 2017-08-14 DIAGNOSIS — Z888 Allergy status to other drugs, medicaments and biological substances status: Secondary | ICD-10-CM

## 2017-08-14 DIAGNOSIS — K5900 Constipation, unspecified: Secondary | ICD-10-CM | POA: Diagnosis not present

## 2017-08-14 DIAGNOSIS — Z7902 Long term (current) use of antithrombotics/antiplatelets: Secondary | ICD-10-CM

## 2017-08-14 DIAGNOSIS — R109 Unspecified abdominal pain: Secondary | ICD-10-CM | POA: Diagnosis not present

## 2017-08-14 DIAGNOSIS — R410 Disorientation, unspecified: Secondary | ICD-10-CM

## 2017-08-14 DIAGNOSIS — R100 Acute abdomen: Secondary | ICD-10-CM | POA: Diagnosis not present

## 2017-08-14 DIAGNOSIS — Z823 Family history of stroke: Secondary | ICD-10-CM

## 2017-08-14 DIAGNOSIS — Z8249 Family history of ischemic heart disease and other diseases of the circulatory system: Secondary | ICD-10-CM

## 2017-08-14 DIAGNOSIS — K5909 Other constipation: Secondary | ICD-10-CM | POA: Diagnosis not present

## 2017-08-14 DIAGNOSIS — G9341 Metabolic encephalopathy: Secondary | ICD-10-CM | POA: Diagnosis present

## 2017-08-14 DIAGNOSIS — I10 Essential (primary) hypertension: Secondary | ICD-10-CM | POA: Diagnosis present

## 2017-08-14 DIAGNOSIS — I130 Hypertensive heart and chronic kidney disease with heart failure and stage 1 through stage 4 chronic kidney disease, or unspecified chronic kidney disease: Secondary | ICD-10-CM | POA: Diagnosis present

## 2017-08-14 DIAGNOSIS — Z87891 Personal history of nicotine dependence: Secondary | ICD-10-CM

## 2017-08-14 DIAGNOSIS — R531 Weakness: Secondary | ICD-10-CM | POA: Diagnosis not present

## 2017-08-14 DIAGNOSIS — I252 Old myocardial infarction: Secondary | ICD-10-CM

## 2017-08-14 DIAGNOSIS — N3281 Overactive bladder: Secondary | ICD-10-CM | POA: Diagnosis present

## 2017-08-14 DIAGNOSIS — N179 Acute kidney failure, unspecified: Secondary | ICD-10-CM | POA: Diagnosis present

## 2017-08-14 DIAGNOSIS — N183 Chronic kidney disease, stage 3 (moderate): Secondary | ICD-10-CM | POA: Diagnosis not present

## 2017-08-14 DIAGNOSIS — F329 Major depressive disorder, single episode, unspecified: Secondary | ICD-10-CM | POA: Diagnosis present

## 2017-08-14 DIAGNOSIS — Z6841 Body Mass Index (BMI) 40.0 and over, adult: Secondary | ICD-10-CM | POA: Diagnosis not present

## 2017-08-14 DIAGNOSIS — F411 Generalized anxiety disorder: Secondary | ICD-10-CM | POA: Diagnosis present

## 2017-08-14 DIAGNOSIS — Z87440 Personal history of urinary (tract) infections: Secondary | ICD-10-CM

## 2017-08-14 DIAGNOSIS — F0391 Unspecified dementia with behavioral disturbance: Secondary | ICD-10-CM | POA: Diagnosis present

## 2017-08-14 DIAGNOSIS — R1084 Generalized abdominal pain: Secondary | ICD-10-CM | POA: Diagnosis not present

## 2017-08-14 DIAGNOSIS — I2510 Atherosclerotic heart disease of native coronary artery without angina pectoris: Secondary | ICD-10-CM | POA: Diagnosis present

## 2017-08-14 DIAGNOSIS — K8020 Calculus of gallbladder without cholecystitis without obstruction: Secondary | ICD-10-CM | POA: Diagnosis not present

## 2017-08-14 DIAGNOSIS — R55 Syncope and collapse: Secondary | ICD-10-CM | POA: Diagnosis present

## 2017-08-14 DIAGNOSIS — I69354 Hemiplegia and hemiparesis following cerebral infarction affecting left non-dominant side: Secondary | ICD-10-CM

## 2017-08-14 DIAGNOSIS — Z794 Long term (current) use of insulin: Secondary | ICD-10-CM

## 2017-08-14 DIAGNOSIS — I5032 Chronic diastolic (congestive) heart failure: Secondary | ICD-10-CM | POA: Diagnosis not present

## 2017-08-14 DIAGNOSIS — R4182 Altered mental status, unspecified: Secondary | ICD-10-CM | POA: Diagnosis not present

## 2017-08-14 DIAGNOSIS — Z885 Allergy status to narcotic agent status: Secondary | ICD-10-CM

## 2017-08-14 DIAGNOSIS — R5381 Other malaise: Secondary | ICD-10-CM | POA: Diagnosis present

## 2017-08-14 DIAGNOSIS — R404 Transient alteration of awareness: Secondary | ICD-10-CM | POA: Diagnosis not present

## 2017-08-14 DIAGNOSIS — E669 Obesity, unspecified: Secondary | ICD-10-CM | POA: Diagnosis present

## 2017-08-14 DIAGNOSIS — G8929 Other chronic pain: Secondary | ICD-10-CM | POA: Diagnosis present

## 2017-08-14 DIAGNOSIS — Z79891 Long term (current) use of opiate analgesic: Secondary | ICD-10-CM

## 2017-08-14 DIAGNOSIS — E785 Hyperlipidemia, unspecified: Secondary | ICD-10-CM | POA: Diagnosis present

## 2017-08-14 DIAGNOSIS — E1122 Type 2 diabetes mellitus with diabetic chronic kidney disease: Secondary | ICD-10-CM | POA: Diagnosis present

## 2017-08-14 DIAGNOSIS — F03918 Unspecified dementia, unspecified severity, with other behavioral disturbance: Secondary | ICD-10-CM | POA: Diagnosis present

## 2017-08-14 DIAGNOSIS — E1129 Type 2 diabetes mellitus with other diabetic kidney complication: Secondary | ICD-10-CM | POA: Diagnosis present

## 2017-08-14 LAB — URINALYSIS, ROUTINE W REFLEX MICROSCOPIC
Bilirubin Urine: NEGATIVE
Glucose, UA: NEGATIVE mg/dL
Hgb urine dipstick: NEGATIVE
Ketones, ur: NEGATIVE mg/dL
Leukocytes, UA: NEGATIVE
Nitrite: NEGATIVE
Protein, ur: NEGATIVE mg/dL
Specific Gravity, Urine: 1.01 (ref 1.005–1.030)
pH: 5 (ref 5.0–8.0)

## 2017-08-14 LAB — CBC WITH DIFFERENTIAL/PLATELET
Basophils Absolute: 0 10*3/uL (ref 0.0–0.1)
Basophils Relative: 0 %
Eosinophils Absolute: 0.3 10*3/uL (ref 0.0–0.7)
Eosinophils Relative: 4 %
HCT: 41.3 % (ref 36.0–46.0)
Hemoglobin: 13.5 g/dL (ref 12.0–15.0)
Lymphocytes Relative: 19 %
Lymphs Abs: 1.4 10*3/uL (ref 0.7–4.0)
MCH: 29.4 pg (ref 26.0–34.0)
MCHC: 32.7 g/dL (ref 30.0–36.0)
MCV: 90 fL (ref 78.0–100.0)
Monocytes Absolute: 0.5 10*3/uL (ref 0.1–1.0)
Monocytes Relative: 7 %
Neutro Abs: 5.1 10*3/uL (ref 1.7–7.7)
Neutrophils Relative %: 70 %
Platelets: 209 10*3/uL (ref 150–400)
RBC: 4.59 MIL/uL (ref 3.87–5.11)
RDW: 13.3 % (ref 11.5–15.5)
WBC: 7.3 10*3/uL (ref 4.0–10.5)

## 2017-08-14 LAB — COMPREHENSIVE METABOLIC PANEL
ALT: 18 U/L (ref 14–54)
AST: 20 U/L (ref 15–41)
Albumin: 3.7 g/dL (ref 3.5–5.0)
Alkaline Phosphatase: 39 U/L (ref 38–126)
Anion gap: 9 (ref 5–15)
BUN: 20 mg/dL (ref 6–20)
CO2: 27 mmol/L (ref 22–32)
Calcium: 9.3 mg/dL (ref 8.9–10.3)
Chloride: 107 mmol/L (ref 101–111)
Creatinine, Ser: 1.29 mg/dL — ABNORMAL HIGH (ref 0.44–1.00)
GFR calc Af Amer: 44 mL/min — ABNORMAL LOW (ref >60)
GFR calc non Af Amer: 38 mL/min — ABNORMAL LOW (ref >60)
Glucose, Bld: 136 mg/dL — ABNORMAL HIGH (ref 65–99)
Potassium: 4.8 mmol/L (ref 3.5–5.1)
Sodium: 143 mmol/L (ref 135–145)
Total Bilirubin: 0.7 mg/dL (ref 0.3–1.2)
Total Protein: 6.5 g/dL (ref 6.5–8.1)

## 2017-08-14 LAB — GLUCOSE, CAPILLARY: Glucose-Capillary: 141 mg/dL — ABNORMAL HIGH (ref 65–99)

## 2017-08-14 LAB — CBG MONITORING, ED: GLUCOSE-CAPILLARY: 132 mg/dL — AB (ref 65–99)

## 2017-08-14 LAB — RAPID URINE DRUG SCREEN, HOSP PERFORMED
Amphetamines: NOT DETECTED
Barbiturates: NOT DETECTED
Benzodiazepines: NOT DETECTED
Cocaine: NOT DETECTED
Opiates: POSITIVE — AB
Tetrahydrocannabinol: NOT DETECTED

## 2017-08-14 LAB — LIPASE, BLOOD: Lipase: 25 U/L (ref 11–51)

## 2017-08-14 LAB — I-STAT CG4 LACTIC ACID, ED: Lactic Acid, Venous: 1.78 mmol/L (ref 0.5–1.9)

## 2017-08-14 MED ORDER — ACETAMINOPHEN 325 MG PO TABS
650.0000 mg | ORAL_TABLET | Freq: Four times a day (QID) | ORAL | Status: DC | PRN
  Administered 2017-08-14 – 2017-08-15 (×2): 650 mg via ORAL
  Filled 2017-08-14 (×2): qty 2

## 2017-08-14 MED ORDER — SODIUM CHLORIDE 0.9 % IV BOLUS (SEPSIS)
1000.0000 mL | Freq: Once | INTRAVENOUS | Status: AC
  Administered 2017-08-14: 1000 mL via INTRAVENOUS

## 2017-08-14 MED ORDER — SODIUM CHLORIDE 0.9 % IV SOLN
INTRAVENOUS | Status: DC
  Administered 2017-08-15: via INTRAVENOUS

## 2017-08-14 MED ORDER — ENOXAPARIN SODIUM 40 MG/0.4ML ~~LOC~~ SOLN
40.0000 mg | SUBCUTANEOUS | Status: DC
  Administered 2017-08-14 – 2017-08-16 (×3): 40 mg via SUBCUTANEOUS
  Filled 2017-08-14 (×3): qty 0.4

## 2017-08-14 MED ORDER — BISACODYL 10 MG RE SUPP: 10.0000 mg | Freq: Every day | RECTAL | Status: DC | PRN

## 2017-08-14 MED ORDER — ONDANSETRON HCL 4 MG PO TABS: 4.0000 mg | ORAL_TABLET | Freq: Four times a day (QID) | ORAL | Status: DC | PRN

## 2017-08-14 MED ORDER — INSULIN ASPART 100 UNIT/ML ~~LOC~~ SOLN
0.0000 [IU] | Freq: Three times a day (TID) | SUBCUTANEOUS | Status: DC
  Administered 2017-08-15: 1 [IU] via SUBCUTANEOUS
  Administered 2017-08-15: 2 [IU] via SUBCUTANEOUS
  Administered 2017-08-15 – 2017-08-16 (×2): 1 [IU] via SUBCUTANEOUS
  Administered 2017-08-16: 2 [IU] via SUBCUTANEOUS
  Administered 2017-08-16 – 2017-08-17 (×2): 1 [IU] via SUBCUTANEOUS
  Administered 2017-08-17: 2 [IU] via SUBCUTANEOUS

## 2017-08-14 MED ORDER — ONDANSETRON HCL 4 MG/2ML IJ SOLN: 4.0000 mg | Freq: Four times a day (QID) | INTRAMUSCULAR | Status: DC | PRN

## 2017-08-14 MED ORDER — MORPHINE SULFATE (PF) 2 MG/ML IV SOLN
1.0000 mg | INTRAVENOUS | Status: DC | PRN
  Administered 2017-08-17: 1 mg via INTRAVENOUS
  Filled 2017-08-14: qty 1

## 2017-08-14 MED ORDER — POLYETHYLENE GLYCOL 3350 17 G PO PACK: 17.0000 g | PACK | Freq: Every day | ORAL | Status: DC | PRN

## 2017-08-14 MED ORDER — IOPAMIDOL (ISOVUE-300) INJECTION 61%
INTRAVENOUS | Status: AC
Start: 2017-08-14 — End: 2017-08-14
  Administered 2017-08-14: 100 mL
  Filled 2017-08-14: qty 100

## 2017-08-14 MED ORDER — SENNA 8.6 MG PO TABS
1.0000 | ORAL_TABLET | Freq: Two times a day (BID) | ORAL | Status: DC
  Administered 2017-08-14 – 2017-08-17 (×5): 8.6 mg via ORAL
  Filled 2017-08-14 (×6): qty 1

## 2017-08-14 MED ORDER — ACETAMINOPHEN 650 MG RE SUPP: 650.0000 mg | Freq: Four times a day (QID) | RECTAL | Status: DC | PRN

## 2017-08-14 MED ORDER — ALBUTEROL SULFATE (2.5 MG/3ML) 0.083% IN NEBU: 2.5000 mg | INHALATION_SOLUTION | RESPIRATORY_TRACT | Status: DC | PRN

## 2017-08-14 NOTE — ED Provider Notes (Signed)
comPlanes of diffuse abdominal pain.  No known fever.  Her son reports that she is been somewhat confused since yesterday, not remembering that she was there yesterday.  Patient is presently alert oriented x3 appears somewhat uncomfortable lungs clear to auscultation heart regular rate and rhythm abdomen obese, normal active bowel sounds, diffusely tender   Doug SouJacubowitz, Cross Jorge, MD 08/14/17 1811

## 2017-08-14 NOTE — H&P (Signed)
History and Physical    Abigail Brooks ZOX:[id number] DOB: 10-28-1934 DOA: 08/14/2017  PCP: Jacques NavyNorins, Michael E, MD   I have briefly reviewed patients previous medical reports [facility].  Patient coming [facility]: [facility].  Chief Complaint: abdominal pain and confusion.  HPI: Abigail Brooks is a 82 year old widowed female, [facility] the last[facility] years, ambulates with the help of a walker, PMH of DM 2, HTN,stroke with residual left hemiparesis, hypothyroid, GERD, anxiety & depression, CAD, chronic diastolic CHF, dementia with behavioral abnormalities who presented to ED due to above complaints. History obtained from EDP and patient's son/healthcare power of attorney at bedside. Patient is a poor historian due to dementia with new onset acute confusion. Patient was in her usual state of health all day yesterday. Son took her home and she had a good time with her family including Christmas dinner. She had no complaints. She is chronically constipated and takes prune juice. She had a BM at his house yesterday. Around 11 AM today, SNF patient was complaining of abdominal pain, notwanting to eat, difficulty walking and more confused than usual. She was sent to the ED for further evaluation. Patient denies nausea, vomiting. Denies urinary frequency, dysuria. No reported fever or chills. No report of chest pain or dyspnea. She underwent extensive evaluation in the ED. He states that patient's mental status is significantly improved now and almost back to her baseline. Earlier on, she was confused and was hallucinating. He denies any recent change in medications. She also reports improvement in her abdominal pain and is hungry and wants to eat. She still has some lower abdominal pain. No sick contacts with similar complaints.  ED Course: afebrile, hemodynamically stable. CBC is normal. BMP only significant for mildly elevated creatinine. Lipase normal. UA not indicative of  UTI.CT abdomen and pelvis with contrast without acute findings. CT head: Limited study but no acute abnormalities.  Review of Systems:  All other systems reviewed and apart from HPI, are negative.  Past Medical History:  Diagnosis Date  . Abnormality of gait   . Anemia   . Anxiety state, unspecified   . Arthritis   . Blood transfusion    when had hysterectomy  . Blood transfusion without reported diagnosis   . Cataract   . Complications affecting other specified body systems, hypertension   . Congestive heart failure (HCC)    2012  . Coronary artery disease    stents x 2, sees Dr. Halina AndreasSmith, [facility]  . Depression   . Diabetes mellitus   . Dysuria   . GERD (gastroesophageal reflux disease)   . H/O hiatal hernia   . Hypertension   . Hypothyroidism    has had hx of treatment  . Myocardial infarction [facility](HCC)    "unsure of when"  . Other and unspecified hyperlipidemia   . Other B-complex deficiencies   . Other malaise and fatigue   . Shortness of breath   . Spinal stenosis    with bone spurs and ruptured disc  . Stroke (HCC)   . Unspecified vitamin D deficiency   . Urinary frequency   . Urinary tract infection    hx of    Past Surgical History:  Procedure Laterality Date  . ABDOMINAL HYSTERECTOMY  1986  . APPENDECTOMY    . BACK SURGERY     ruptured disc, lumbar area  . BLADDER SUSPENSION    . CARDIAC CATHETERIZATION    . CATARACT EXTRACTION W/ INTRAOCULAR LENS  IMPLANT, BILATERAL    . coronary     stents x 2, stent implant placed9/21/06  . EYE SURGERY    . LUMBAR LAMINECTOMY/DECOMPRESSION MICRODISCECTOMY  12/21/2011   Procedure: LUMBAR LAMINECTOMY/DECOMPRESSION MICRODISCECTOMY 1 LEVEL;  Surgeon: Temple PaciniHenry A Pool, MD;  Location: [facility];  Service: Neurosurgery;  Laterality: Bilateral;  Lumbar four-five decompressive lumbar laminectomy; Right Lumbar four-five microdiscectomy  . SPINE SURGERY      Social History  reports that she has quit smoking. Her smoking use  included cigarettes. Her smokeless tobacco use includes snuff. She reports that she does not drink alcohol or use drugs.  Allergies  Allergen Reactions  . Morphine And Related Other (See Comments)    Makes her crazy  . Enalapril Swelling    Facial swelling    Family History  Problem Relation Age of Onset  . Anesthesia problems Sister   . Stroke Sister   . Anesthesia problems Son   . Thyroid cancer Son   . Heart disease Mother   . Stroke Mother   . Diabetes Sister      Prior to Admission medications   Medication Sig Start Date End Date Taking? Authorizing Provider  atorvastatin (LIPITOR) 10 MG tablet TAKE 1 TABLET BY MOUTH EVERY DAY AT 6PM 02/03/15   Sharon SellerEubanks, Jessica K, NP  buPROPion [facility] [facility](WELLBUTRIN SR) 100 MG 12 hr tablet Take 1 tablet (100 mg total) by mouth daily. 08/26/14   Micki RileySethi, [name] S, MD  Calcium Carbonate-Vitamin D (CALCIUM + D PO) Take 1 tablet by mouth daily. Calcium 315 mg,Vitamin D3 250 I.U.    [provider]  clonazePAM (KLONOPIN) 0.5 MG tablet Take 0.5 mg by mouth daily.  11/12/14   [provider]  clopidogrel (PLAVIX) 75 MG tablet TAKE 1 TABLET BY MOUTH EVERY DAY 02/03/15   Sharon SellerEubanks, Jessica K, NP  fenofibrate 54 MG tablet Take 1 tablet (54 mg total) by mouth daily. 08/10/14   [name], Mahima, MD  ferrous sulfate 325 (65 FE) MG tablet TAKE 1 TABLET BY MOUTH EVERY MORNING WITH BREAKFAST Patient not taking: Reported on 04/24/2017 02/03/15   Sharon SellerEubanks, Jessica K, NP  fesoterodine (TOVIAZ) 4 MG TB24 tablet Take 4 mg by mouth daily.    [provider]  fish oil-omega-3 fatty acids 1000 MG capsule Take 1 g by mouth daily.     [provider]  furosemide (LASIX) 20 MG tablet Take 20 mg by mouth. Three times per week.    [provider]  hydrALAZINE (APRESOLINE) 25 MG tablet Take 25 mg by mouth 3 (three) times daily.    [provider]  Hypromellose (ARTIFICIAL TEARS OP) Apply 1 Dose to eye daily.    [provider]    insulin glargine (LANTUS) 100 UNIT/ML injection Inject 36 Units into the skin at bedtime.     [provider]  losartan-hydrochlorothiazide (HYZAAR) 50-12.5 MG per tablet Take 1 tablet by mouth daily. 09/25/14   [provider]  metFORMIN (GLUCOPHAGE) 500 MG tablet Take 1 tablet (500 mg total) by mouth daily with breakfast. 08/10/14   [name], Mahima, MD  metoprolol tartrate (LOPRESSOR) 25 MG tablet Take 25 mg by mouth 2 (two) times daily.    [provider]  morphine (MS CONTIN) 15 MG 12 hr tablet 15 mg daily. 04/19/17   [provider]  Multiple Vitamin (MULTIVITAMIN) tablet Take 1 tablet by mouth daily.    [provider]  nitroGLYCERIN (NITROSTAT) 0.4 MG SL tablet Place 0.4 mg under the tongue  every 5 (five) minutes as needed for chest pain. Reported on 10/12/2015    [provider]  NOVOLOG FLEXPEN 100 UNIT/ML FlexPen Inject into the skin 2 (two) times daily. SSI- 151-200=4 units 201-250=6 units 251-300=8 units 301-350=10 units 351-400=12 units 09/24/15   [provider]  NYSTATIN powder  01/20/16   [provider]  OPANA ER, CRUSH RESISTANT, 7.5 MG T12A 7.5 mg daily. 10/08/16   [provider]  oxyCODONE-acetaminophen (PERCOCET/ROXICET) 5-325 MG per tablet Take 1 tablet by mouth every 8 (eight) hours as needed.  10/26/14   [provider]  QUEtiapine (SEROQUEL) 25 MG tablet TAKE 1 TABLET BY MOUTH EVERY MORNING & 2 TABLETS IN THE EVENING Patient not taking: Reported on 10/18/2016 02/03/15   Sharon Seller, NP  senna-docusate (SENNA S) 8.6-50 MG per tablet Take 1 tablet by mouth 2 (two) times daily.    [provider]  traZODone (DESYREL) 50 MG tablet 50 mg as needed. Son unsure if she is taking, not on nursing home [facility] 09/24/16   [provider]  trimethoprim (TRIMPEX) 100 MG tablet 100 mg daily. 09/24/16   [provider]  UNABLE TO FIND UTI for UTIs liquid oral give 30ml PO QD    [provider]    Physical Exam: Vitals:   08/14/17 1730 08/14/17 1745 08/14/17 1830 08/14/17 1838  BP: (!) 154/88 (!) 151/86  (!) 160/64  Pulse: (!) 59 (!) 52 (!) 54 (!) 55  Resp: 14 16 13 16   Temp:      TempSrc:      SpO2: 100% 100% 100% 100%  Weight:          Constitutional: elderly female, moderately built and overweight, lying comfortably supine in bed. Eyes: PERTLA, lids and conjunctivae normal ENMT: Mucous membranes are slightly dry. Posterior pharynx clear of any exudate or lesions. Normal dentition.  Neck: supple, no masses, no thyromegaly Respiratory: clear to auscultation bilaterally, no wheezing, no crackles. Normal respiratory effort. No accessory muscle use.  Cardiovascular: S1 & S2 heard, regular rate and rhythm, no murmurs / rubs / gallops. No extremity edema. 2+ pedal pulses. No carotid bruits.  Abdomen: not distended. Mild tenderness in the upper quadrants with some voluntary guarding but no rigidity or rebound. No organomegaly or masses appreciated. Normal bowel sounds heard. Musculoskeletal: no clubbing / cyanosis. No joint deformity upper and lower extremities. Good ROM, no contractures. Normal muscle tone.  Skin: no rashes, lesions, ulcers. No induration Neurologic: CN 2-12 grossly intact. Sensation intact, DTR normal. Strength 5/5 in all limbs except left lower extremity with grade 4 x 5 power.  Psychiatric: impaired judgment and insight. Alert and oriented x 2. Normal mood.     Labs on Admission: I have personally reviewed following labs and imaging studies  CBC: Recent Labs  Lab 08/14/17 1400  WBC 7.3  NEUTROABS 5.1  HGB 13.5  HCT 41.3  MCV 90.0  PLT 209   Basic Metabolic Panel: Recent Labs  Lab 08/14/17 1400  NA 143  K 4.8  CL 107  CO2 27  GLUCOSE 136*  BUN 20  CREATININE 1.29*  CALCIUM 9.3   Liver Function Tests: Recent Labs  Lab 08/14/17 1400  AST 20  ALT 18  ALKPHOS 39  BILITOT 0.7  PROT 6.5  ALBUMIN 3.7   CBG: Recent  Labs  Lab 08/14/17 1247  GLUCAP 132*   Urine analysis:    Component Value Date/Time   COLORURINE YELLOW 08/14/2017 1252   APPEARANCEUR  CLEAR 08/14/2017 1252   APPEARANCEUR Clear 11/10/2013 1606   LABSPEC 1.010 08/14/2017 1252   PHURINE 5.0 08/14/2017 1252   GLUCOSEU NEGATIVE 08/14/2017 1252   HGBUR NEGATIVE 08/14/2017 1252   BILIRUBINUR NEGATIVE 08/14/2017 1252   BILIRUBINUR Negative 06/16/2014 1639   BILIRUBINUR Negative 11/10/2013 1606   KETONESUR NEGATIVE 08/14/2017 1252   PROTEINUR NEGATIVE 08/14/2017 1252   UROBILINOGEN 0.2 11/14/2014 1620   NITRITE NEGATIVE 08/14/2017 1252   LEUKOCYTESUR NEGATIVE 08/14/2017 1252   LEUKOCYTESUR 1+ (A) 11/10/2013 1606     Radiological Exams on Admission: Ct Head Wo Contrast  Result Date: 08/14/2017 CLINICAL DATA:  Altered mental status EXAM: CT HEAD WITHOUT CONTRAST TECHNIQUE: Contiguous axial images were obtained from the base of the skull through the vertex without intravenous contrast. COMPARISON:  11/14/2014, 08/21/2014 FINDINGS: Brain: Mild motion degradation. Study limited by prior intravenous contrast administration. No large vessel territorial infarction. Encephalomalacia in the right frontal lobe at the site of prior infarct. Mild small vessel ischemic changes of the white matter. Mild to moderate atrophy. Ventricles are nonenlarged. Vascular: Increased density within the dural venous sinuses and circle of Willis. Carotid artery calcification Skull: No fracture.  Small amount of fluid in the left mastoid. Sinuses/Orbits: Mild mucosal thickening in the ethmoid sinuses. No acute orbital abnormality. Other: None IMPRESSION: 1. The study is limited by patient motion and prior administration of intravenous contrast. 2. No definite acute intracranial abnormality is seen 3. Old right frontal lobe infarct. Electronically Signed   By: Jasmine Pang M.D.   On: 08/14/2017 17:16   Ct Abdomen Pelvis W Contrast  Result Date: 08/14/2017 CLINICAL  DATA:  Abdominal pain. EXAM: CT ABDOMEN AND PELVIS WITH CONTRAST TECHNIQUE: Multidetector CT imaging of the abdomen and pelvis was performed using the standard protocol following bolus administration of intravenous contrast. CONTRAST:  ISOVUE-300 IOPAMIDOL (ISOVUE-300) INJECTION 61% COMPARISON:  CT scan dated 06/11/2016 FINDINGS: Lower chest: No acute abnormality. Aortic atherosclerosis. Coronary artery calcification. Heart size is normal. Hepatobiliary: Hepatic steatosis. Stones in the otherwise normal gallbladder. No bile duct dilatation. Pancreas: Normal. Spleen: Normal. Adrenals/Urinary Tract: Normal adrenal glands. Normal appearing right kidney. Left renal cortical atrophy with the 14 mm stone in the lower pole of the small cyst in the lateral aspect of the lower pole measuring 14 mm. No hydronephrosis. 13 mm left lateral bladder diverticulum. Evidence of previous bladder suspension. Stomach/Bowel: Stomach is within normal limits. Appendix has been removed. No evidence of bowel wall thickening, distention, or inflammatory changes. Vascular/Lymphatic: Aortic atherosclerosis. No enlarged abdominal or pelvic lymph nodes. Other: No abdominal wall hernia or abnormality. Musculoskeletal: No acute abnormalities. Severe degenerative disc disease in the lower lumbar spine. Grade 1 spondylolisthesis at L4-5. IMPRESSION: 1. Cholelithiasis. 2. 14 mm stone in the atrophic left kidney.  No obstruction. 3. Aortic atherosclerosis. 4. Multilevel degenerative disc disease, most severe at L4-5. Electronically Signed   By: Francene Boyers M.D.   On: 08/14/2017 16:06     Assessment/Plan Principal Problem:   Abdominal pain Active Problems:   CAD (coronary artery disease)   Chronic diastolic CHF (congestive heart failure) (HCC)   Constipation   Essential hypertension, benign   Overactive bladder   Dementia with behavioral disturbance   Type 2 diabetes mellitus with renal manifestations, controlled (HCC)   Acute  renal failure superimposed on stage 3 chronic kidney disease (HCC)   HLD (hyperlipidemia)     1. Abdominal pain:unclear etiology. DD: Food poisoning/acute GE, constipation. Renal colic seems less likely given the quality of  pain and CT abdomen findings. UTI seems less likely in the absence of urinary symptoms and bland urine microscopy. No sick contacts to collaborate food poisoning. No comment regarding stool burden on CT. Mesenteric ischemia possible but felt less likely. Abdominal pain has improved. Check serum lactate. Trial of clear liquids and monitor. If continues to improve then advance diet in a.m. as tolerated. If abdominal pain persists or worsens, may need further evaluation. Discussed in detail with patient's son who verbalizes understanding. Overactive bladder is a possibility as well. 2. Confusion: unclear etiology. May be related to acute pain, dehydration, mild acute on chronic kidney disease complicating underlying moderate dementia. No new focal deficits on exam. CT head without acute findings. No infectious etiology noted. 3. Dehydration: IV fluids. 4. Acute on stage III chronic kidney disease:Baseline creatinine not known. Last creatinine in system is 1.07 from 2016. This may be her baseline. Brief IV fluids and follow BMP in a.m. 5. Essential hypertension: Mildly uncontrolled. Resume home medications after they have been reconciled by pharmacy. 6. DM2: SSI 7. CAD/chronic diastolic CHF: Hold diuretics for today. Reassess in a.m. 8. Dementia with behavioral disturbance: Mental status improved and almost back to baseline. 9. Constipation: Bowel regimen.   DVT prophylaxis: Lovenox  Code Status: Full. Confirmed with son at bedside.  Family Communication: discussed in detail with patient's son at bedside. Updated care and answered questions.  Disposition Plan: DC back to SNF when medically improved.  Consults called: none.  Admission status: medical bed, observation.   Severity  of Illness: The appropriate patient status for this patient is OBSERVATION. Observation status is judged to be reasonable and necessary in order to provide the required intensity of service to ensure the patient's safety. The patient's presenting symptoms, physical exam findings, and initial radiographic and laboratory data in the context of their medical condition is felt to place them at decreased risk for further clinical deterioration. Furthermore, it is anticipated that the patient will be medically stable for discharge from the hospital within 2 midnights of admission. The following factors support the patient status of observation.   " The patient's presenting symptoms include abdominal pain, confusion. " The physical exam findings include abdominal tenderness. " The initial radiographic and laboratory data are creatinine of 1.29. CT abdomen and head without acute findings.      Marcellus Scott MD [facility] Pager [phone]  If 7PM-7AM, please contact night-coverage [email or web address] Password [facility]  08/14/2017, 6:46 PM

## 2017-08-14 NOTE — ED Notes (Signed)
Patient in CT

## 2017-08-14 NOTE — ED Triage Notes (Addendum)
Pt has hx of dementia in [facility]. Today stafff states pt is unable to ambulate and c/o generalized abdominal pain. Pt has hx of type II diabetes, cbg 132 on arrival. BP 146/72, HR 82, 96% room air resp 16. Denies fevers

## 2017-08-14 NOTE — ED Notes (Signed)
IV attempt x 3, no blood return noted. IV team consult placed

## 2017-08-14 NOTE — ED Provider Notes (Signed)
[facility] EMERGENCY DEPARTMENT Provider Note   CSN: [id number] Arrival date & time: 08/14/17  1242     History   Chief Complaint Chief Complaint  Patient presents with  . Abdominal Pain    HPI Abigail Brooks is a 82 y.o. female.  HPI level 5 caveat due to dementia and altered mental status   82 year old female presents today with altered mental status.  Per EMS patient was in her usual state of health yesterday.  She is coming from nursing facilty where she was up ambulating without difficulty and partaking in holiday festivities.  Nursing staff notes that this morning she was altered complaining of abdominal pain.  EMS notes CBG of 132, blood pressure 146/72, heart rate of 82 and 96% oxygen saturation on room air.  Patient denies any fevers, reports abdominal pain and urinary frequency over the last 24 hours.  She notes the abdominal pain is worse in the suprapubic and right lower quadrant, but has pain everywhere with palpation.  She reports an episode of vomiting yesterday, none today.  She notes she was constipated yesterday which is normal for her.  Reports taking prune juice which caused a loose stool.  Patient denies any focal neurological deficits other than generalized weakness.  She denies any trauma.  Son is at bedside reports that patient is altered which is not usual for her.  Past Medical History:  Diagnosis Date  . Abnormality of gait   . Anemia   . Anxiety state, unspecified   . Arthritis   . Blood transfusion    when had hysterectomy  . Blood transfusion without reported diagnosis   . Cataract   . Complications affecting other specified body systems, hypertension   . Congestive heart failure (HCC)    2012  . Coronary artery disease    stents x 2, sees Dr. Halina Andreas practice  . Depression   . Diabetes mellitus   . Dysuria   . GERD (gastroesophageal reflux disease)   . H/O hiatal hernia   . Hypertension   . Hypothyroidism    has had hx of treatment  . Myocardial infarction [facility])    "unsure of when"  . Other and unspecified hyperlipidemia   . Other B-complex deficiencies   . Other malaise and fatigue   . Shortness of breath   . Spinal stenosis    with bone spurs and ruptured disc  . Stroke (HCC)   . Unspecified vitamin D deficiency   . Urinary frequency   . Urinary tract infection    hx of    Patient Active Problem List   Diagnosis Date Noted  . Abdominal pain 08/14/2017  . HLD (hyperlipidemia)   . Acute ischemic stroke (HCC) 11/14/2014  . Acute thrombotic stroke 11/14/2014  . Acute renal failure superimposed on stage 3 chronic kidney disease (HCC) 11/14/2014  . Mild cognitive impairment 08/26/2014  . UTI (lower urinary tract infection) 08/21/2014  . Syncope and collapse 08/21/2014  . CKD (chronic kidney disease) stage 3, GFR 30-59 ml/min (HCC) 08/10/2014  . Anemia, iron deficiency 08/10/2014  . 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 Stroke (HCC) 05/29/2014  . Type 2 diabetes mellitus with renal manifestations, controlled (HCC) 05/05/2014  . Arthritis, senescent 05/05/2014  . Falls frequently 04/22/2014  . TIA (transient ischemic attack) 02/23/2014  . CVA (cerebral vascular accident) (HCC) 02/21/2014  . Left-sided weakness 02/21/2014  . CVA (cerebral infarction) 02/21/2014  . Dementia with behavioral disturbance 11/25/2013  . Overactive bladder  11/04/2013  . Essential hypertension, benign 10/21/2013  . Memory loss 10/21/2013  . Delusional disorder (HCC) 10/21/2013  . UTI (urinary tract infection) 10/21/2013  . Falls 08/05/2013  . Syncope 07/01/2013    Class: Acute  . Constipation 06/24/2013  . Routine general medical examination at a health care facility 06/24/2013  . Cartilage disorder 06/24/2013  . Other and unspecified hyperlipidemia 06/23/2013  . Hypertension associated with diabetes (HCC) 11/18/2012  . Type II or unspecified type diabetes mellitus without mention of  complication, not stated as uncontrolled 11/18/2012  . CAD (coronary artery disease) 11/18/2012  . Depression 11/18/2012  . Anxiety state, unspecified 11/18/2012  . Chronic diastolic CHF (congestive heart failure) (HCC) 11/18/2012  . Lumbar stenosis with neurogenic claudication 12/21/2011    Past Surgical History:  Procedure Laterality Date  . ABDOMINAL HYSTERECTOMY  1986  . APPENDECTOMY    . BACK SURGERY     ruptured disc, lumbar area  . BLADDER SUSPENSION    . CARDIAC CATHETERIZATION    . CATARACT EXTRACTION W/ INTRAOCULAR LENS  IMPLANT, BILATERAL    . coronary     stents x 2, stent implant placed9/21/06  . EYE SURGERY    . LUMBAR LAMINECTOMY/DECOMPRESSION MICRODISCECTOMY  12/21/2011   Procedure: LUMBAR LAMINECTOMY/DECOMPRESSION MICRODISCECTOMY 1 LEVEL;  Surgeon: Temple Pacini, MD;  Location: [facility];  Service: Neurosurgery;  Laterality: Bilateral;  Lumbar four-five decompressive lumbar laminectomy; Right Lumbar four-five microdiscectomy  . SPINE SURGERY      OB History    No data available       Home Medications    Prior to Admission medications   Medication Sig Start Date End Date Taking? Authorizing Provider  atorvastatin (LIPITOR) 10 MG tablet TAKE 1 TABLET BY MOUTH EVERY DAY AT 6PM 02/03/15   Sharon Seller, NP  buPROPion [facility] SR) 100 MG 12 hr tablet Take 1 tablet (100 mg total) by mouth daily. 08/26/14   Micki Riley, MD  Calcium Carbonate-Vitamin D (CALCIUM + D PO) Take 1 tablet by mouth daily. Calcium 315 mg,Vitamin D3 250 I.U.    [provider]  clonazePAM (KLONOPIN) 0.5 MG tablet Take 0.5 mg by mouth daily.  11/12/14   [provider]  clopidogrel (PLAVIX) 75 MG tablet TAKE 1 TABLET BY MOUTH EVERY DAY 02/03/15   Sharon Seller, NP  fenofibrate 54 MG tablet Take 1 tablet (54 mg total) by mouth daily. 08/10/14   Oneal Grout, MD  ferrous sulfate 325 (65 FE) MG tablet TAKE 1 TABLET BY MOUTH EVERY MORNING WITH BREAKFAST Patient not  taking: Reported on 04/24/2017 02/03/15   Sharon Seller, NP  fesoterodine (TOVIAZ) 4 MG TB24 tablet Take 4 mg by mouth daily.    [provider]  fish oil-omega-3 fatty acids 1000 MG capsule Take 1 g by mouth daily.     [provider]  furosemide (LASIX) 20 MG tablet Take 20 mg by mouth. Three times per week.    [provider]  hydrALAZINE (APRESOLINE) 25 MG tablet Take 25 mg by mouth 3 (three) times daily.    [provider]  Hypromellose (ARTIFICIAL TEARS OP) Apply 1 Dose to eye daily.    [provider]  insulin glargine (LANTUS) 100 UNIT/ML injection Inject 36 Units into the skin at bedtime.     [provider]  losartan-hydrochlorothiazide (HYZAAR) 50-12.5 MG per tablet Take 1 tablet by mouth daily. 09/25/14   [provider]  metFORMIN (GLUCOPHAGE) 500 MG tablet Take 1  tablet (500 mg total) by mouth daily with breakfast. 08/10/14   [name], Mahima, MD  metoprolol tartrate (LOPRESSOR) 25 MG tablet Take 25 mg by mouth 2 (two) times daily.    [provider]  morphine (MS CONTIN) 15 MG 12 hr tablet 15 mg daily. 04/19/17   [provider]  Multiple Vitamin (MULTIVITAMIN) tablet Take 1 tablet by mouth daily.    [provider]  nitroGLYCERIN (NITROSTAT) 0.4 MG SL tablet Place 0.4 mg under the tongue every 5 (five) minutes as needed for chest pain. Reported on 10/12/2015    [provider]  NOVOLOG FLEXPEN 100 UNIT/ML FlexPen Inject into the skin 2 (two) times daily. SSI- 151-200=4 units 201-250=6 units 251-300=8 units 301-350=10 units 351-400=12 units 09/24/15   [provider]  NYSTATIN powder  01/20/16   [provider]  OPANA ER, CRUSH RESISTANT, 7.5 MG T12A 7.5 mg daily. 10/08/16   [provider]  oxyCODONE-acetaminophen (PERCOCET/ROXICET) 5-325 MG per tablet Take 1 tablet by mouth every 8 (eight) hours as needed.  10/26/14   [provider]  QUEtiapine (SEROQUEL) 25  MG tablet TAKE 1 TABLET BY MOUTH EVERY MORNING & 2 TABLETS IN THE EVENING Patient not taking: Reported on 10/18/2016 02/03/15   Sharon SellerEubanks, Jessica K, NP  senna-docusate (SENNA S) 8.6-50 MG per tablet Take 1 tablet by mouth 2 (two) times daily.    [provider]  traZODone (DESYREL) 50 MG tablet 50 mg as needed. Son unsure if she is taking, [facility]   [provider]  trimethoprim (TRIMPEX) 100 MG tablet 100 mg daily. 09/24/16   [provider]  UNABLE TO FIND UTI for UTIs liquid oral give 30ml PO QD    [provider]    Family History Family History  Problem Relation Age of Onset  . Anesthesia problems Sister   . Stroke Sister   . Anesthesia problems Son   . Thyroid cancer Son   . Heart disease Mother   . Stroke Mother   . Diabetes Sister     Social History Social History   Tobacco Use  . Smoking status: Former Smoker    Types: Cigarettes  . Smokeless tobacco: Current User    Types: Snuff  . Tobacco comment: 40 years ago  Substance Use Topics  . Alcohol use: No    Alcohol/week: 0.0 oz  . Drug use: No     Allergies   Morphine and related and Enalapril   Review of Systems Review of Systems  All other systems reviewed and are negative.    Physical Exam Updated Vital Signs BP 133/61   Pulse (!) 59   Temp 98 F (36.7 C) (Oral)   Resp (!) 22   Wt 90.7 kg (200 lb)   SpO2 92%   BMI 40.40 kg/m   Physical Exam  Constitutional: She is oriented to person, place, and time. She appears well-developed and well-nourished.  HENT:  Head: Normocephalic and atraumatic.  Eyes: Conjunctivae are normal. Pupils are equal, round, and reactive to light. Right eye exhibits no discharge. Left eye exhibits no discharge. No scleral icterus.  Neck: Normal range of motion. No JVD present. No tracheal deviation present.  Cardiovascular: Normal rate and regular rhythm.  Pulmonary/Chest: Effort normal and breath sounds normal. No  stridor. No respiratory distress. She has no wheezes. She has no rales. She exhibits no tenderness.  Abdominal: Soft. Normal appearance. She exhibits no distension and no mass. There is tenderness.  There is no rebound and no guarding. No hernia.  Diffuse abd TTP- guarding noted   Neurological: She is alert and oriented to person, place, and time. No cranial nerve deficit or sensory deficit. She exhibits normal muscle tone. Coordination normal.  Skin: Skin is warm.  Psychiatric: She has a normal mood and affect. Her behavior is normal. Judgment and thought content normal.  Nursing note and vitals reviewed.    ED Treatments / Results  Labs (all labs ordered are listed, but only abnormal results are displayed) Labs Reviewed  COMPREHENSIVE METABOLIC PANEL - Abnormal; Notable for the following components:      Result Value   Glucose, Bld 136 (*)    Creatinine, Ser 1.29 (*)    GFR calc non Af Amer 38 (*)    GFR calc Af Amer 44 (*)    All other components within normal limits  RAPID URINE DRUG SCREEN, HOSP PERFORMED - Abnormal; Notable for the following components:   Opiates POSITIVE (*)    All other components within normal limits  CBG MONITORING, ED - Abnormal; Notable for the following components:   Glucose-Capillary 132 (*)    All other components within normal limits  URINE CULTURE  CBC WITH DIFFERENTIAL/PLATELET  LIPASE, BLOOD  URINALYSIS, ROUTINE W REFLEX MICROSCOPIC  I-STAT CG4 LACTIC ACID, ED    EKG  EKG Interpretation None       Radiology Ct Head Wo Contrast  Result Date: 08/14/2017 CLINICAL DATA:  Altered mental status EXAM: CT HEAD WITHOUT CONTRAST TECHNIQUE: Contiguous axial images were obtained from the base of the skull through the vertex without intravenous contrast. COMPARISON:  11/14/2014, 08/21/2014 FINDINGS: Brain: Mild motion degradation. Study limited by prior intravenous contrast administration. No large vessel territorial infarction. Encephalomalacia in  the right frontal lobe at the site of prior infarct. Mild small vessel ischemic changes of the white matter. Mild to moderate atrophy. Ventricles are nonenlarged. Vascular: Increased density within the dural venous sinuses and circle of Willis. Carotid artery calcification Skull: No fracture.  Small amount of fluid in the left mastoid. Sinuses/Orbits: Mild mucosal thickening in the ethmoid sinuses. No acute orbital abnormality. Other: None IMPRESSION: 1. The study is limited by patient motion and prior administration of intravenous contrast. 2. No definite acute intracranial abnormality is seen 3. Old right frontal lobe infarct. Electronically Signed   By: Jasmine PangKim  Fujinaga M.D.   On: 08/14/2017 17:16   Ct Abdomen Pelvis W Contrast  Result Date: 08/14/2017 CLINICAL DATA:  Abdominal pain. EXAM: CT ABDOMEN AND PELVIS WITH CONTRAST TECHNIQUE: Multidetector CT imaging of the abdomen and pelvis was performed using the standard protocol following bolus administration of intravenous contrast. CONTRAST:  100mL ISOVUE-300 IOPAMIDOL (ISOVUE-300) INJECTION 61% COMPARISON:  CT scan dated 06/11/2016 FINDINGS: Lower chest: No acute abnormality. Aortic atherosclerosis. Coronary artery calcification. Heart size is normal. Hepatobiliary: Hepatic steatosis. Stones in the otherwise normal gallbladder. No bile duct dilatation. Pancreas: Normal. Spleen: Normal. Adrenals/Urinary Tract: Normal adrenal glands. Normal appearing right kidney. Left renal cortical atrophy with the 14 mm stone in the lower pole of the small cyst in the lateral aspect of the lower pole measuring 14 mm. No hydronephrosis. 13 mm left lateral bladder diverticulum. Evidence of previous bladder suspension. Stomach/Bowel: Stomach is within normal limits. Appendix has been removed. No evidence of bowel wall thickening, distention, or inflammatory changes. Vascular/Lymphatic: Aortic atherosclerosis. No enlarged abdominal or pelvic lymph nodes. Other: No abdominal wall  hernia or abnormality. Musculoskeletal: No acute abnormalities. Severe degenerative disc disease in  the lower lumbar spine. Grade 1 spondylolisthesis at L4-5. IMPRESSION: 1. Cholelithiasis. 2. 14 mm stone in the atrophic left kidney.  No obstruction. 3. Aortic atherosclerosis. 4. Multilevel degenerative disc disease, most severe at L4-5. Electronically Signed   By: Francene Boyers M.D.   On: 08/14/2017 16:06    Procedures Procedures (including critical care time)  Medications Ordered in ED Medications  iopamidol (ISOVUE-300) 61 % injection (100 mLs  Contrast Given 08/14/17 1518)  sodium chloride 0.9 % bolus 1,000 mL (0 mLs Intravenous Stopped 08/14/17 1833)     Initial Impression / Assessment and Plan / ED Course  I have reviewed the triage vital signs and the nursing notes.  Pertinent labs & imaging results that were available during my care of the patient were reviewed by me and considered in my medical decision making (see chart for details).     Final Clinical Impressions(s) / ED Diagnoses   Final diagnoses:  Altered mental status, unspecified altered mental status type    Labs: CBC, CMP, lipase  Imaging: CT abdomen and pelvis with contrast  Consults: [facility]  Therapeutics: Normal saline  Discharge Meds:   Assessment/Plan: 82 year old female presents today with complaints of abdominal pain and altered mental status.  Patient does report some urinary frequency with suprapubic abdominal pain.  She also is having diffuse abdominal tenderness palpation.  Basic labs will be ordered, CT scan ordered.  Patient afebrile with no tachycardia presently.  She appears stable at this time.  Low suspicion for intrathoracic or intra-abdominal pathology including dissection.  Patients workup without acute findings to explain abdominal pain or altered mental status.  Reassessment shows continued ongoing alteration in mental status.  Hospitalist service consulted for admission.      ED  Discharge Orders    None       Rosalio Loud 08/14/17 2113    Doug Sou, MD 08/15/17 [phone]

## 2017-08-15 ENCOUNTER — Observation Stay (HOSPITAL_COMMUNITY): Payer: Medicare Other

## 2017-08-15 DIAGNOSIS — E860 Dehydration: Secondary | ICD-10-CM | POA: Diagnosis present

## 2017-08-15 DIAGNOSIS — Z87891 Personal history of nicotine dependence: Secondary | ICD-10-CM | POA: Diagnosis not present

## 2017-08-15 DIAGNOSIS — E785 Hyperlipidemia, unspecified: Secondary | ICD-10-CM

## 2017-08-15 DIAGNOSIS — Z6841 Body Mass Index (BMI) 40.0 and over, adult: Secondary | ICD-10-CM | POA: Diagnosis not present

## 2017-08-15 DIAGNOSIS — R1084 Generalized abdominal pain: Secondary | ICD-10-CM | POA: Diagnosis not present

## 2017-08-15 DIAGNOSIS — F411 Generalized anxiety disorder: Secondary | ICD-10-CM | POA: Diagnosis present

## 2017-08-15 DIAGNOSIS — K5900 Constipation, unspecified: Secondary | ICD-10-CM | POA: Diagnosis not present

## 2017-08-15 DIAGNOSIS — B999 Unspecified infectious disease: Secondary | ICD-10-CM | POA: Diagnosis not present

## 2017-08-15 DIAGNOSIS — R4182 Altered mental status, unspecified: Secondary | ICD-10-CM | POA: Diagnosis not present

## 2017-08-15 DIAGNOSIS — F329 Major depressive disorder, single episode, unspecified: Secondary | ICD-10-CM | POA: Diagnosis present

## 2017-08-15 DIAGNOSIS — N3281 Overactive bladder: Secondary | ICD-10-CM

## 2017-08-15 DIAGNOSIS — I130 Hypertensive heart and chronic kidney disease with heart failure and stage 1 through stage 4 chronic kidney disease, or unspecified chronic kidney disease: Secondary | ICD-10-CM | POA: Diagnosis present

## 2017-08-15 DIAGNOSIS — G8929 Other chronic pain: Secondary | ICD-10-CM | POA: Diagnosis present

## 2017-08-15 DIAGNOSIS — K5909 Other constipation: Secondary | ICD-10-CM | POA: Diagnosis present

## 2017-08-15 DIAGNOSIS — Z794 Long term (current) use of insulin: Secondary | ICD-10-CM | POA: Diagnosis not present

## 2017-08-15 DIAGNOSIS — R319 Hematuria, unspecified: Secondary | ICD-10-CM | POA: Diagnosis not present

## 2017-08-15 DIAGNOSIS — I5032 Chronic diastolic (congestive) heart failure: Secondary | ICD-10-CM | POA: Diagnosis present

## 2017-08-15 DIAGNOSIS — N179 Acute kidney failure, unspecified: Secondary | ICD-10-CM | POA: Diagnosis not present

## 2017-08-15 DIAGNOSIS — N390 Urinary tract infection, site not specified: Secondary | ICD-10-CM | POA: Diagnosis not present

## 2017-08-15 DIAGNOSIS — I252 Old myocardial infarction: Secondary | ICD-10-CM | POA: Diagnosis not present

## 2017-08-15 DIAGNOSIS — I69354 Hemiplegia and hemiparesis following cerebral infarction affecting left non-dominant side: Secondary | ICD-10-CM | POA: Diagnosis not present

## 2017-08-15 DIAGNOSIS — F0391 Unspecified dementia with behavioral disturbance: Secondary | ICD-10-CM | POA: Diagnosis present

## 2017-08-15 DIAGNOSIS — R1032 Left lower quadrant pain: Secondary | ICD-10-CM | POA: Diagnosis not present

## 2017-08-15 DIAGNOSIS — E669 Obesity, unspecified: Secondary | ICD-10-CM | POA: Diagnosis present

## 2017-08-15 DIAGNOSIS — R55 Syncope and collapse: Secondary | ICD-10-CM | POA: Diagnosis present

## 2017-08-15 DIAGNOSIS — Z87440 Personal history of urinary (tract) infections: Secondary | ICD-10-CM | POA: Diagnosis not present

## 2017-08-15 DIAGNOSIS — E1122 Type 2 diabetes mellitus with diabetic chronic kidney disease: Secondary | ICD-10-CM | POA: Diagnosis present

## 2017-08-15 DIAGNOSIS — I10 Essential (primary) hypertension: Secondary | ICD-10-CM | POA: Diagnosis not present

## 2017-08-15 DIAGNOSIS — G9341 Metabolic encephalopathy: Secondary | ICD-10-CM | POA: Diagnosis present

## 2017-08-15 DIAGNOSIS — R5381 Other malaise: Secondary | ICD-10-CM | POA: Diagnosis present

## 2017-08-15 DIAGNOSIS — R109 Unspecified abdominal pain: Secondary | ICD-10-CM | POA: Diagnosis not present

## 2017-08-15 DIAGNOSIS — N183 Chronic kidney disease, stage 3 (moderate): Secondary | ICD-10-CM | POA: Diagnosis present

## 2017-08-15 DIAGNOSIS — I25119 Atherosclerotic heart disease of native coronary artery with unspecified angina pectoris: Secondary | ICD-10-CM | POA: Diagnosis not present

## 2017-08-15 DIAGNOSIS — Z823 Family history of stroke: Secondary | ICD-10-CM | POA: Diagnosis not present

## 2017-08-15 DIAGNOSIS — I2510 Atherosclerotic heart disease of native coronary artery without angina pectoris: Secondary | ICD-10-CM | POA: Diagnosis present

## 2017-08-15 LAB — CBC
HCT: 38 % (ref 36.0–46.0)
Hemoglobin: 12.2 g/dL (ref 12.0–15.0)
MCH: 29.3 pg (ref 26.0–34.0)
MCHC: 32.1 g/dL (ref 30.0–36.0)
MCV: 91.1 fL (ref 78.0–100.0)
PLATELETS: 201 10*3/uL (ref 150–400)
RBC: 4.17 MIL/uL (ref 3.87–5.11)
RDW: 13.3 % (ref 11.5–15.5)
WBC: 6.9 10*3/uL (ref 4.0–10.5)

## 2017-08-15 LAB — GLUCOSE, CAPILLARY
GLUCOSE-CAPILLARY: 130 mg/dL — AB (ref 65–99)
GLUCOSE-CAPILLARY: 155 mg/dL — AB (ref 65–99)
GLUCOSE-CAPILLARY: 168 mg/dL — AB (ref 65–99)
Glucose-Capillary: 127 mg/dL — ABNORMAL HIGH (ref 65–99)

## 2017-08-15 LAB — BASIC METABOLIC PANEL
Anion gap: 7 (ref 5–15)
BUN: 15 mg/dL (ref 6–20)
CALCIUM: 8.9 mg/dL (ref 8.9–10.3)
CHLORIDE: 109 mmol/L (ref 101–111)
CO2: 25 mmol/L (ref 22–32)
CREATININE: 1.15 mg/dL — AB (ref 0.44–1.00)
GFR calc Af Amer: 51 mL/min — ABNORMAL LOW (ref >60)
GFR calc non Af Amer: 44 mL/min — ABNORMAL LOW (ref >60)
GLUCOSE: 113 mg/dL — AB (ref 65–99)
Potassium: 4 mmol/L (ref 3.5–5.1)
Sodium: 141 mmol/L (ref 135–145)

## 2017-08-15 LAB — MRSA PCR SCREENING: MRSA BY PCR: NEGATIVE

## 2017-08-15 MED ORDER — POLYVINYL ALCOHOL 1.4 % OP SOLN
1.0000 [drp] | Freq: Four times a day (QID) | OPHTHALMIC | Status: DC
  Administered 2017-08-15 – 2017-08-17 (×8): 1 [drp] via OPHTHALMIC
  Filled 2017-08-15: qty 15

## 2017-08-15 MED ORDER — TRIMETHOPRIM 100 MG PO TABS
100.0000 mg | ORAL_TABLET | Freq: Every day | ORAL | Status: DC
  Administered 2017-08-15 – 2017-08-17 (×3): 100 mg via ORAL
  Filled 2017-08-15 (×3): qty 1

## 2017-08-15 MED ORDER — METOPROLOL TARTRATE 25 MG PO TABS
25.0000 mg | ORAL_TABLET | Freq: Two times a day (BID) | ORAL | Status: DC
  Administered 2017-08-15 – 2017-08-17 (×4): 25 mg via ORAL
  Filled 2017-08-15 (×4): qty 1

## 2017-08-15 MED ORDER — TAB-A-VITE/IRON PO TABS
1.0000 | ORAL_TABLET | Freq: Every day | ORAL | Status: DC
  Administered 2017-08-15 – 2017-08-17 (×3): 1 via ORAL
  Filled 2017-08-15 (×3): qty 1

## 2017-08-15 MED ORDER — HYDRALAZINE HCL 25 MG PO TABS
25.0000 mg | ORAL_TABLET | Freq: Three times a day (TID) | ORAL | Status: DC
  Administered 2017-08-15 – 2017-08-17 (×5): 25 mg via ORAL
  Filled 2017-08-15 (×5): qty 1

## 2017-08-15 MED ORDER — FESOTERODINE FUMARATE ER 4 MG PO TB24
4.0000 mg | ORAL_TABLET | Freq: Every evening | ORAL | Status: DC
  Administered 2017-08-15 – 2017-08-16 (×2): 4 mg via ORAL
  Filled 2017-08-15 (×4): qty 1

## 2017-08-15 MED ORDER — SODIUM CHLORIDE 0.9 % IV SOLN
INTRAVENOUS | Status: AC
  Administered 2017-08-15: 12:00:00 via INTRAVENOUS

## 2017-08-15 MED ORDER — CARBOXYMETHYLCELLULOSE SODIUM 0.5 % OP SOLN: 1.0000 [drp] | Freq: Four times a day (QID) | OPHTHALMIC | Status: DC

## 2017-08-15 MED ORDER — POLYETHYLENE GLYCOL 3350 17 G PO PACK
17.0000 g | PACK | Freq: Every day | ORAL | Status: DC
  Administered 2017-08-16 – 2017-08-17 (×2): 17 g via ORAL
  Filled 2017-08-15 (×2): qty 1

## 2017-08-15 MED ORDER — HYDROCHLOROTHIAZIDE 12.5 MG PO CAPS
12.5000 mg | ORAL_CAPSULE | Freq: Every day | ORAL | Status: DC
  Administered 2017-08-15 – 2017-08-17 (×3): 12.5 mg via ORAL
  Filled 2017-08-15 (×3): qty 1

## 2017-08-15 MED ORDER — BUPROPION HCL ER (SR) 100 MG PO TB12
100.0000 mg | ORAL_TABLET | Freq: Every day | ORAL | Status: DC
  Administered 2017-08-15 – 2017-08-17 (×3): 100 mg via ORAL
  Filled 2017-08-15 (×3): qty 1

## 2017-08-15 MED ORDER — FENOFIBRATE 54 MG PO TABS
54.0000 mg | ORAL_TABLET | Freq: Every day | ORAL | Status: DC
  Administered 2017-08-15 – 2017-08-17 (×3): 54 mg via ORAL
  Filled 2017-08-15 (×4): qty 1

## 2017-08-15 MED ORDER — LOSARTAN POTASSIUM 50 MG PO TABS
50.0000 mg | ORAL_TABLET | Freq: Every day | ORAL | Status: DC
  Administered 2017-08-15 – 2017-08-17 (×3): 50 mg via ORAL
  Filled 2017-08-15 (×3): qty 1

## 2017-08-15 MED ORDER — CLONAZEPAM 0.5 MG PO TABS
0.5000 mg | ORAL_TABLET | Freq: Every day | ORAL | Status: DC
  Administered 2017-08-15 – 2017-08-17 (×3): 0.5 mg via ORAL
  Filled 2017-08-15 (×3): qty 1

## 2017-08-15 MED ORDER — MULTI-VITAMIN/IRON PO TABS: 1.0000 | ORAL_TABLET | ORAL | Status: DC

## 2017-08-15 MED ORDER — MORPHINE SULFATE ER 15 MG PO TBCR
15.0000 mg | EXTENDED_RELEASE_TABLET | ORAL | Status: DC
  Administered 2017-08-16 – 2017-08-17 (×2): 15 mg via ORAL
  Filled 2017-08-15 (×2): qty 1

## 2017-08-15 MED ORDER — LOSARTAN POTASSIUM-HCTZ 50-12.5 MG PO TABS: 1.0000 | ORAL_TABLET | Freq: Every day | ORAL | Status: DC

## 2017-08-15 MED ORDER — CLOPIDOGREL BISULFATE 75 MG PO TABS
75.0000 mg | ORAL_TABLET | Freq: Every day | ORAL | Status: DC
  Administered 2017-08-15 – 2017-08-17 (×3): 75 mg via ORAL
  Filled 2017-08-15 (×3): qty 1

## 2017-08-15 MED ORDER — NITROGLYCERIN 0.4 MG SL SUBL: 0.4000 mg | SUBLINGUAL_TABLET | SUBLINGUAL | Status: DC | PRN

## 2017-08-15 MED ORDER — ATORVASTATIN CALCIUM 10 MG PO TABS
10.0000 mg | ORAL_TABLET | Freq: Every day | ORAL | Status: DC
  Administered 2017-08-15 – 2017-08-16 (×2): 10 mg via ORAL
  Filled 2017-08-15 (×2): qty 1

## 2017-08-15 NOTE — Progress Notes (Signed)
PROGRESS NOTE   Abigail Brooks  ZOX:[id number]    DOB: 01-18-1935    DOA: 08/14/2017  PCP: Abigail Navy, MD   I have briefly reviewed patients previous medical records in [facility].  Brief Narrative:  82 year old widowed female, resident of [facility] SNF for the last 3.5 years, ambulates with the help of a walker, PMH of DM 2/IDDM, HTN,stroke with residual left hemiparesis, hypothyroid, GERD, anxiety & depression, CAD, chronic diastolic CHF, dementia with behavioral abnormalities, chronic constipation, chronic pain on MS Contin who presented to ED due to abdominal pain and confusion that started on the morning of hospital admission. Admitted for evaluation of abdominal pain and confusion.   Assessment & Plan:   Principal Problem:   Abdominal pain Active Problems:   CAD (coronary artery disease)   Chronic diastolic CHF (congestive heart failure) (HCC)   Constipation   Essential hypertension, benign   Overactive bladder   Dementia with behavioral disturbance   Type 2 diabetes mellitus with renal manifestations, controlled (HCC)   Acute renal failure superimposed on stage 3 chronic kidney disease (HCC)   HLD (hyperlipidemia)   1. Abdominal pain: Unclear etiology. DD: Full poisoning/acute GE, chronic constipation, overactive bladder. Renal colic, UTI and mesenteric ischemia seem less likely. Abdominal pain has progressively improved but still has mild pain. Has tolerated clear liquids. Had a large BM today. KUB results as below appreciated. Aggressive bowel regimen. Advanced diet to full liquids today and reassess in a.m. If she has persistent abdominal pain or worsening, may need further evaluation. CT abdomen without acute findings. 2. Acute confusion complicating underlying moderate dementia: Unclear etiology. May have been related to acute pain, dehydration and mild acute on chronic kidney disease complicating underlying dementia. No new focal neurological deficits. CT  head without acute findings. No infectious etiology noted. Improved in mental status likely back to baseline as per discussion with family. 3. Dehydration: Improved after gentle IV fluids. Encourage oral fluid intake. DC IV fluids. 4. Acute on stage III chronic kidney disease: Baseline creatinine not known. Last creatinine in system 1.07 from 2016. Presented with creatinine of 1.29. May be due to dehydration, diuretics and ARB PTA. Briefly hydrate with IV fluids. Creatinine has improved. Follow BMP periodically. 5. Deconditioning/presyncope: As discussed with PT today, patient reported dizziness/lightheadedness on sitting and standing for greater than 15-20 seconds but no orthostatic changes. Unclear etiology. Reassess ambulation in a.m. Will place on telemetry. 6. Essential hypertension: Patient on polypharmacy at SNF. Unfortunately hospital pharmacy had unsuccessfully tried to obtain her home medication list but were only able to obtain it this afternoon. Resumed hydralazine, Hyzaar, metoprolol. Monitor. 7. Type II DM/IDDM: Patient on Lantus, SSI and metformin PTA. Hold metformin and Lantus. Patient's CBGs reasonably well controlled as below just on SSI. Adjust insulin's as needed. 8. Chronic diastolic CHF/CAD: Temporarily holding Lasix. Clinically euvolemic. 9. Hyperlipidemia: Continue atorvastatin and fenofibrate. 10. Depression/anxiety/dementia with behavioral disturbances: Continue home dose of Wellbutrin, clonazepam. 11. Overactive bladder: Continue Toviaz 12. History of recurrent UTIs: Continue suppressive therapy with trimethoprim. 13. Chronic pain: Continue home dose of MS Contin. 14. Chronic constipation: Aggressive bowel regimen.   DVT prophylaxis: Lovenox Code Status: Full Family Communication: Discussed in detail with patient's daughter Banker at [facility]) and patient's son-in-law at bedside. Disposition: DC Bactrim SNF when medically improved.   Consultants:  None    Procedures:  None  Antimicrobials:  Chronic trimethoprim continue    Subjective: Seen this morning. Reports abdominal pain is better. Son-in-law agrees  that abdominal pain is even better than last night. Tolerating clear liquids. Patient and family willing to advanced to full liquids. Had a large BM today. As per discussion with PT, when attempting to sit/tender up, repeatedly feeling dizzy, lightheaded and almost passing out.   ROS: As above  Objective:  Vitals:   08/14/17 2150 08/14/17 2235 08/15/17 0553 08/15/17 1329  BP: (!) 141/59 (!) 145/45 (!) 146/60 (!) 172/60  Pulse: 65 (!) 56 66 60  Resp: 15 20 16 20   Temp:  98.1 F (36.7 C) 98.1 F (36.7 C) 98.3 F (36.8 C)  TempSrc:  Oral Oral Oral  SpO2: 100% 100% 100% 100%  Weight:  89.4 kg (197 lb 1.5 oz) 90.3 kg (199 lb 1.2 oz)   Height:  5' (1.524 m)      Examination:  General exam: Elderly female, moderately built and overweight, lying comfortably supine in bed. Oral mucosa moist. Respiratory system: Clear to auscultation. Respiratory effort normal. Cardiovascular system: S1 & S2 heard, RRR. No JVD, murmurs, rubs, gallops or clicks. No pedal edema. Gastrointestinal system: Abdomen is nondistended, soft. Mild patchy generalized tenderness without rigidity, guarding or rebound. No organomegaly or masses felt. Normal bowel sounds heard. Central nervous system: Alert and oriented to self and partly to place. No focal neurological deficits. Extremities: Symmetric 5 x 5 power in all limbs except left lower extremity with grade 4 x 5 power. Skin: No rashes, lesions or ulcers Psychiatry: Judgement and insight impaired. Mood & affect pleasant.     Data Reviewed: I have personally reviewed following labs and imaging studies  CBC: Recent Labs  Lab 08/14/17 1400 08/15/17 0441  WBC 7.3 6.9  NEUTROABS 5.1  --   HGB 13.5 12.2  HCT 41.3 38.0  MCV 90.0 91.1  PLT 209 201   Basic Metabolic Panel: Recent Labs  Lab  08/14/17 1400 08/15/17 0441  NA 143 141  K 4.8 4.0  CL 107 109  CO2 27 25  GLUCOSE 136* 113*  BUN 20 15  CREATININE 1.29* 1.15*  CALCIUM 9.3 8.9   Liver Function Tests: Recent Labs  Lab 08/14/17 1400  AST 20  ALT 18  ALKPHOS 39  BILITOT 0.7  PROT 6.5  ALBUMIN 3.7   CBG: Recent Labs  Lab 08/14/17 1247 08/14/17 2234 08/15/17 0811 08/15/17 1153  GLUCAP 132* 141* 127* 130*    Recent Results (from the past 240 hour(s))  MRSA PCR Screening     Status: None   Collection Time: 08/14/17 10:28 PM  Result Value Ref Range Status   MRSA by PCR NEGATIVE NEGATIVE Final    Comment:        The GeneXpert MRSA Assay (FDA approved for NASAL specimens only), is one component of a comprehensive MRSA colonization surveillance program. It is not intended to diagnose MRSA infection nor to guide or monitor treatment for MRSA infections.          Radiology Studies: Ct Head Wo Contrast  Result Date: 08/14/2017 CLINICAL DATA:  Altered mental status EXAM: CT HEAD WITHOUT CONTRAST TECHNIQUE: Contiguous axial images were obtained from the base of the skull through the vertex without intravenous contrast. COMPARISON:  11/14/2014, 08/21/2014 FINDINGS: Brain: Mild motion degradation. Study limited by prior intravenous contrast administration. No large vessel territorial infarction. Encephalomalacia in the right frontal lobe at the site of prior infarct. Mild small vessel ischemic changes of the white matter. Mild to moderate atrophy. Ventricles are nonenlarged. Vascular: Increased density within the dural venous sinuses and circle  of Willis. Carotid artery calcification Skull: No fracture.  Small amount of fluid in the left mastoid. Sinuses/Orbits: Mild mucosal thickening in the ethmoid sinuses. No acute orbital abnormality. Other: None IMPRESSION: 1. The study is limited by patient motion and prior administration of intravenous contrast. 2. No definite acute intracranial abnormality is seen  3. Old right frontal lobe infarct. Electronically Signed   By: Jasmine PangKim  Fujinaga M.D.   On: 08/14/2017 17:16   Ct Abdomen Pelvis W Contrast  Result Date: 08/14/2017 CLINICAL DATA:  Abdominal pain. EXAM: CT ABDOMEN AND PELVIS WITH CONTRAST TECHNIQUE: Multidetector CT imaging of the abdomen and pelvis was performed using the standard protocol following bolus administration of intravenous contrast. CONTRAST:  100mL ISOVUE-300 IOPAMIDOL (ISOVUE-300) INJECTION 61% COMPARISON:  CT scan dated 06/11/2016 FINDINGS: Lower chest: No acute abnormality. Aortic atherosclerosis. Coronary artery calcification. Heart size is normal. Hepatobiliary: Hepatic steatosis. Stones in the otherwise normal gallbladder. No bile duct dilatation. Pancreas: Normal. Spleen: Normal. Adrenals/Urinary Tract: Normal adrenal glands. Normal appearing right kidney. Left renal cortical atrophy with the 14 mm stone in the lower pole of the small cyst in the lateral aspect of the lower pole measuring 14 mm. No hydronephrosis. 13 mm left lateral bladder diverticulum. Evidence of previous bladder suspension. Stomach/Bowel: Stomach is within normal limits. Appendix has been removed. No evidence of bowel wall thickening, distention, or inflammatory changes. Vascular/Lymphatic: Aortic atherosclerosis. No enlarged abdominal or pelvic lymph nodes. Other: No abdominal wall hernia or abnormality. Musculoskeletal: No acute abnormalities. Severe degenerative disc disease in the lower lumbar spine. Grade 1 spondylolisthesis at L4-5. IMPRESSION: 1. Cholelithiasis. 2. 14 mm stone in the atrophic left kidney.  No obstruction. 3. Aortic atherosclerosis. 4. Multilevel degenerative disc disease, most severe at L4-5. Electronically Signed   By: Francene BoyersJames  Maxwell M.D.   On: 08/14/2017 16:06   Dg Abd Portable 1v  Result Date: 08/15/2017 CLINICAL DATA:  82 year old female with history of left lower quadrant pain since yesterday evening. Nausea but no vomiting. EXAM: PORTABLE  ABDOMEN - 1 VIEW COMPARISON:  CT the abdomen and pelvis 08/14/2017. FINDINGS: Gas and stool are seen scattered throughout the colon extending to the level of the distal rectum. No pathologic distension of small bowel is noted. A few nondilated gas-filled loops of small bowel are noted part (nonspecific). No gross evidence of pneumoperitoneum. Soft tissue anchor is projecting over the symphysis pubis incidentally noted. IMPRESSION: 1. Nonspecific, nonobstructive bowel gas pattern, as above. 2. No pneumoperitoneum. Electronically Signed   By: Trudie Reedaniel  Entrikin M.D.   On: 08/15/2017 11:05        Scheduled Meds: . atorvastatin  10 mg Oral q1800  . buPROPion  100 mg Oral Daily  . carboxymethylcellulose  1 drop Both Eyes QID  . clonazePAM  0.5 mg Oral Daily  . clopidogrel  75 mg Oral Daily  . enoxaparin (LOVENOX) injection  40 mg Subcutaneous Q24H  . fenofibrate  54 mg Oral Daily  . fesoterodine  4 mg Oral QPM  . hydrALAZINE  25 mg Oral TID  . insulin aspart  0-9 Units Subcutaneous TID WC  . losartan-hydrochlorothiazide  1 tablet Oral Daily  . metoprolol tartrate  25 mg Oral BID  . [START ON 08/16/2017] morphine  15 mg Oral BH-q7a  . [START ON 08/16/2017] MULTI-VITAMIN/IRON  1 tablet Oral BH-q7a  . polyethylene glycol  17 g Oral Daily  . senna  1 tablet Oral BID  . trimethoprim  100 mg Oral Daily   Continuous Infusions: . sodium chloride 50  mL/hr at 08/15/17 1202     LOS: 0 days     Marcellus ScottAnand Kimiye Strathman, MD, FACP, [facility]. [facility] Pager [phone]  If 7PM-7AM, please contact night-coverage [email or web address] Password [email or web address] 08/15/2017, 4:49 PM

## 2017-08-15 NOTE — Evaluation (Signed)
Physical Therapy Evaluation Patient Details Name: Abigail PitcherFrances L Boffa MRN: [id number] DOB: 1935-01-10 Today's Date: 08/15/2017   History of Present Illness  82 year old female presents today with altered mental status unable to ambulate and c/o generalized abdominal pain. PMH of DM 2, HTN,stroke with residual left hemiparesis, hypothyroid, GERD, anxiety & depression, CAD, chronic diastolic CHF, dementia with behavioral abnormalities.  Clinical Impression  Orders received for PT evaluation. Patient demonstrates deficits in functional mobility as indicated below. Will benefit from continued skilled PT to address deficits and maximize function. Will see as indicated and progress as tolerated.   OF NOTE: Patient evaluated by physical therapy. During evaluation, patient expressed dizziness/lightheaded feeling. When sitting EOB (which improved with time) then again in standing which did not improve and in 3 trials of standing patient nearly collapsing each time when standing >15 to 20 seconds with minimal responsiveness, inability to keep eyes open, and legs buckling. BP supine 146/64, assessed in sitting 152/63 (97) and assessed in standing 149/129 (134). MD notified. O2 saturations stable throughout 97% and above      Follow Up Recommendations SNF    Equipment Recommendations  None recommended by PT    Recommendations for Other Services       Precautions / Restrictions Precautions Precautions: Fall      Mobility  Bed Mobility Overal bed mobility: Needs Assistance Bed Mobility: Supine to Sit;Sit to Supine     Supine to sit: Supervision Sit to supine: Supervision   General bed mobility comments: no physical assist, increased time and effort to perform  Transfers Overall transfer level: Needs assistance Equipment used: Rolling walker (2 wheeled) Transfers: Sit to/from UGI CorporationStand;Stand Pivot Transfers Sit to Stand: Min guard(min guard to perform, MAX TO TOTAL TO MAINTAIN) Stand pivot  transfers: Min assist;+2 physical assistance       General transfer comment: Min guard to power up to stnaind, no physical assist required however after approximately 15-20 seconds of standing patient with BLE buckling, decreased responsiveness, eyes shutting and almost complete collapse requiring max to total assist of two to maintain standing enough to get BP  Ambulation/Gait             General Gait Details: unable to perform  Stairs            Wheelchair Mobility    Modified Rankin (Stroke Patients Only)       Balance Overall balance assessment: Needs assistance   Sitting balance-Leahy Scale: Fair Sitting balance - Comments: able to sit EOB without issues     Standing balance-Leahy Scale: Poor Standing balance comment: Min guard to power up to stnaind, no physical assist required however after approximately 15-20 seconds of standing patient with BLE buckling, decreased responsiveness, eyes shutting and almost complete collapse requiring max to total assist of two to maintain standing enough to get BP                             Pertinent Vitals/Pain Pain Assessment: Faces Faces Pain Scale: Hurts even more Pain Location: left abdominal pain and flank pain Pain Descriptors / Indicators: Grimacing;Guarding Pain Intervention(s): Monitored during session    Home Living Family/patient expects to be discharged to:: Skilled nursing facility               Home Equipment: Dan HumphreysWalker - 2 wheels;Shower seat;Cane - single point Additional Comments: patient from [facility]    Prior Function Level of Independence: Needs assistance   Gait / Transfers  Assistance Needed: independent with RW for mobility PTA  ADL's / Homemaking Assistance Needed: assist with bathing        Hand Dominance   Dominant Hand: Right    Extremity/Trunk Assessment   Upper Extremity Assessment Upper Extremity Assessment: Generalized weakness    Lower Extremity  Assessment Lower Extremity Assessment: Generalized weakness(no overt assymetries or isolated weakness with testing)    Cervical / Trunk Assessment Cervical / Trunk Assessment: (increased body habitus)  Communication   Communication: No difficulties  Cognition Arousal/Alertness: Awake/alert Behavior During Therapy: WFL for tasks assessed/performed Overall Cognitive Status: History of cognitive impairments - at baseline(overall functional for task performance, some lability )                                 General Comments: overall functional for task performance during session but did have some emotional lability at the end of session      General Comments      Exercises     Assessment/Plan    PT Assessment Patient needs continued PT services  PT Problem List Decreased strength;Decreased activity tolerance;Decreased balance;Decreased mobility;Obesity;Pain       PT Treatment Interventions DME instruction;Gait training;Functional mobility training;Therapeutic activities;Therapeutic exercise;Balance training;Patient/family education    PT Goals (Current goals can be found in the Care Plan section)  Acute Rehab PT Goals Patient Stated Goal: to get better PT Goal Formulation: With patient/family Time For Goal Achievement: 08/29/17 Potential to Achieve Goals: Good    Frequency Min 2X/week   Barriers to discharge        Co-evaluation               AM-PAC PT "6 Clicks" Daily Activity  Outcome Measure Difficulty turning over in bed (including adjusting bedclothes, sheets and blankets)?: A Little Difficulty moving from lying on back to sitting on the side of the bed? : A Little Difficulty sitting down on and standing up from a chair with arms (e.g., wheelchair, bedside commode, etc,.)?: A Little Help needed moving to and from a bed to chair (including a wheelchair)?: A Little Help needed walking in hospital room?: Total Help needed climbing 3-5 steps  with a railing? : Total 6 Click Score: 14    End of Session Equipment Utilized During Treatment: Gait belt;Oxygen Activity Tolerance: Treatment limited secondary to medical complications (Comment) Patient left: in bed;with call bell/phone within reach;with family/visitor present Nurse Communication: Mobility status PT Visit Diagnosis: Difficulty in walking, not elsewhere classified (R26.2)    Time: [id number] PT Time Calculation (min) (ACUTE ONLY): 20 min   Charges:   PT Evaluation $PT Eval Moderate Complexity: 1 Mod     PT G Codes:        Charlotte Crumbevon Lavergne Hiltunen, PT DPT  Board Certified Neurologic Specialist [phone]   Fabio AsaDevon J Christabell Loseke 08/15/2017, 10:41 AM

## 2017-08-15 NOTE — Progress Notes (Signed)
Pt arrived floor in the company of a Charity fundraiserN. Pt was assessed to be AxOx2. Vitals signs completed. Skin assessment completed. Pt was settled and oriented to the room. Pt complained of some pain , and pt was medicated. Will continue to monitor.

## 2017-08-15 NOTE — Progress Notes (Signed)
Patient evaluated by physical therapy. During evaluation, patient expressed dizziness/lightheaded feeling. When sitting EOB (which improved with time) then again in standing which did not improve and in 3 trials of standing patient nearly collapsing each time when standing >15 to 20 seconds with minimal responsiveness, inability to keep eyes open, and legs buckling. BP supine 146/64, assessed in sitting 152/63 (97) and assessed in standing 149/129 (134). MD notified.  Abigail Brooks, PT DPT  Board Certified Neurologic Specialist [phone]

## 2017-08-16 DIAGNOSIS — R4182 Altered mental status, unspecified: Secondary | ICD-10-CM

## 2017-08-16 DIAGNOSIS — R109 Unspecified abdominal pain: Secondary | ICD-10-CM

## 2017-08-16 DIAGNOSIS — I5032 Chronic diastolic (congestive) heart failure: Secondary | ICD-10-CM

## 2017-08-16 DIAGNOSIS — F0391 Unspecified dementia with behavioral disturbance: Secondary | ICD-10-CM

## 2017-08-16 DIAGNOSIS — N179 Acute kidney failure, unspecified: Secondary | ICD-10-CM

## 2017-08-16 DIAGNOSIS — I25119 Atherosclerotic heart disease of native coronary artery with unspecified angina pectoris: Secondary | ICD-10-CM

## 2017-08-16 DIAGNOSIS — N183 Chronic kidney disease, stage 3 (moderate): Secondary | ICD-10-CM

## 2017-08-16 LAB — GLUCOSE, CAPILLARY
GLUCOSE-CAPILLARY: 137 mg/dL — AB (ref 65–99)
GLUCOSE-CAPILLARY: 178 mg/dL — AB (ref 65–99)
Glucose-Capillary: 127 mg/dL — ABNORMAL HIGH (ref 65–99)
Glucose-Capillary: 194 mg/dL — ABNORMAL HIGH (ref 65–99)

## 2017-08-16 NOTE — Clinical Social Work Placement (Signed)
   CLINICAL SOCIAL WORK PLACEMENT  NOTE  Date:  08/16/2017  Patient Details  Name: Abigail Brooks MRN: [id number] Date of Birth: 05-27-1935  Clinical Social Work is seeking post-discharge placement for this patient at the Skilled  Nursing Facility level of care (*CSW will initial, date and re-position this form in  chart as items are completed):      [facility]family provided with [facility] Clinical [facility] Work Department's list of facilities offering this level of care within the geographic area requested by the patient (or if unable, by the patient's family).  Yes   Patient/family informed of their freedom to choose among providers that offer the needed level of care, that participate in Medicare, Medicaid or managed care program needed by the patient, have an available bed and are willing to accept the patient.      Patient/family informed of [facility]'s ownership interest [facility] and [facility], as well as of the fact that they are under no obligation to receive care at these facilities.  PASRR submitted to EDS on       PASRR number received on 08/16/17     Existing PASRR number confirmed on 08/16/17     FL2 transmitted to all facilities in geographic area requested by pt/family on 08/16/17     FL2 transmitted to all facilities within larger geographic area on       Patient informed that his/her managed care company has contracts with or will negotiate with certain facilities, including the following:        Yes   Patient/family informed of bed offers received.  [facility]     Physician recommends and patient chooses bed at      [facility]  Patient to be transferred to facility by [facility]     Patient family notified on 08/17/17 of transfer.  Name of family member notified:        PHYSICIAN Please prepare priority discharge summary, including medications, Please prepare prescriptions,  Please sign FL2     Additional Comment:    _______________________________________________ Maree KrabbeBridget A Antonela Freiman, LCSW 08/16/2017, 4:15 PM

## 2017-08-16 NOTE — [place] FL2 (Signed)
[place] MEDICAID FL2 LEVEL OF CARE SCREENING TOOL     IDENTIFICATION  Patient Name: Abigail Brooks Birthdate: May 13, 1935 Sex: female Admission Date (Current Location): 08/14/2017  [facility] and [place] Number:  Producer, television/film/video and Address:  [facility], [address] [place], [place] [address]      Provider Number: [id number]  Attending Physician Name and Address:  Leatha Gilding, MD  Relative Name and Phone Number:       Current Level of Care: Hospital Recommended Level of Care: Skilled Nursing Facility Prior Approval Number:    Date Approved/Denied:   PASRR Number:  [id number]   Discharge Plan: SNF    Current Diagnoses: Patient Active Problem List   Diagnosis Date Noted  . Abdominal pain 08/14/2017  . HLD (hyperlipidemia)   . Acute ischemic stroke (HCC) 11/14/2014  . Acute thrombotic stroke 11/14/2014  . Acute renal failure superimposed on stage 3 chronic kidney disease (HCC) 11/14/2014  . Mild cognitive impairment 08/26/2014  . UTI (lower urinary tract infection) 08/21/2014  . Syncope and collapse 08/21/2014  . CKD (chronic kidney disease) stage 3, GFR 30-59 ml/min (HCC) 08/10/2014  . Anemia, iron deficiency 08/10/2014  . 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 Stroke (HCC) 05/29/2014  . Type 2 diabetes mellitus with renal manifestations, controlled (HCC) 05/05/2014  . Arthritis, senescent 05/05/2014  . Falls frequently 04/22/2014  . TIA (transient ischemic attack) 02/23/2014  . CVA (cerebral vascular accident) (HCC) 02/21/2014  . Left-sided weakness 02/21/2014  . CVA (cerebral infarction) 02/21/2014  . Dementia with behavioral disturbance 11/25/2013  . Overactive bladder 11/04/2013  . Essential hypertension, benign 10/21/2013  . Memory loss 10/21/2013  . Delusional disorder (HCC) 10/21/2013  . UTI (urinary tract infection) 10/21/2013  . Falls 08/05/2013  . Syncope 07/01/2013    Class: Acute  . Constipation  06/24/2013  . Routine general medical examination at a health care facility 06/24/2013  . Cartilage disorder 06/24/2013  . Other and unspecified hyperlipidemia 06/23/2013  . Hypertension associated with diabetes (HCC) 11/18/2012  . Type II or unspecified type diabetes mellitus without mention of complication, not stated as uncontrolled 11/18/2012  . CAD (coronary artery disease) 11/18/2012  . Depression 11/18/2012  . Anxiety state, unspecified 11/18/2012  . Chronic diastolic CHF (congestive heart failure) (HCC) 11/18/2012  . Lumbar stenosis with neurogenic claudication 12/21/2011    Orientation RESPIRATION BLADDER Height & Weight     Self, Time, Situation, Place  Normal Incontinent, External catheter(placed 12/26) Weight: 201 lb 4.5 oz (91.3 kg) Height:  5' (152.4 cm)  BEHAVIORAL SYMPTOMS/MOOD NEUROLOGICAL BOWEL NUTRITION STATUS      Continent (Please see d/c summary)  AMBULATORY STATUS COMMUNICATION OF NEEDS Skin   Extensive Assist Verbally Normal                       Personal Care Assistance Level of Assistance  Bathing, Feeding, Dressing Bathing Assistance: Maximum assistance Feeding assistance: Limited assistance Dressing Assistance: Maximum assistance     Functional Limitations Info  Sight, Hearing, Speech Sight Info: Adequate Hearing Info: Adequate Speech Info: Adequate    SPECIAL CARE FACTORS FREQUENCY  PT (By licensed PT), OT (By licensed OT)     PT Frequency: 3x OT Frequency: 3x            Contractures Contractures Info: Not present    Additional Factors Info  Code Status, Allergies Code Status Info: Full Code Allergies Info: Morphine And Related, Enalapril  Current Medications (08/16/2017):  This is the current hospital active medication list Current Facility-Administered Medications  Medication Dose Route Frequency Provider Last Rate Last Dose  . acetaminophen (TYLENOL) tablet 650 mg  650 mg Oral Q6H PRN Elease EtienneHongalgi, Anand D, MD    650 mg at 08/15/17 1926   Or  . acetaminophen (TYLENOL) suppository 650 mg  650 mg Rectal Q6H PRN Hongalgi, Anand D, MD      . albuterol (PROVENTIL) (2.5 MG/3ML) 0.083% nebulizer solution 2.5 mg  2.5 mg Nebulization Q2H PRN Hongalgi, Anand D, MD      . atorvastatin (LIPITOR) tablet 10 mg  10 mg Oral q1800 Elease EtienneHongalgi, Anand D, MD   10 mg at 08/15/17 1750  . bisacodyl (DULCOLAX) suppository 10 mg  10 mg Rectal Daily PRN Elease EtienneHongalgi, Anand D, MD      . buPROPion [facility](WELLBUTRIN SR) 12 hr tablet 100 mg  100 mg Oral Daily Elease EtienneHongalgi, Anand D, MD   100 mg at 08/16/17 0834  . clonazePAM (KLONOPIN) tablet 0.5 mg  0.5 mg Oral Daily Elease EtienneHongalgi, Anand D, MD   0.5 mg at 08/16/17 0834  . clopidogrel (PLAVIX) tablet 75 mg  75 mg Oral Daily Elease EtienneHongalgi, Anand D, MD   75 mg at 08/16/17 0836  . enoxaparin (LOVENOX) injection 40 mg  40 mg Subcutaneous Q24H Elease EtienneHongalgi, Anand D, MD   40 mg at 08/15/17 2233  . fenofibrate tablet 54 mg  54 mg Oral Daily Elease EtienneHongalgi, Anand D, MD   54 mg at 08/16/17 0834  . fesoterodine (TOVIAZ) tablet 4 mg  4 mg Oral QPM Hongalgi, Maximino GreenlandAnand D, MD   4 mg at 08/15/17 1758  . hydrALAZINE (APRESOLINE) tablet 25 mg  25 mg Oral TID Elease EtienneHongalgi, Anand D, MD   25 mg at 08/16/17 1512  . hydrochlorothiazide (MICROZIDE) capsule 12.5 mg  12.5 mg Oral Daily Marcellus ScottHongalgi, Anand D, MD   12.5 mg at 08/16/17 0835  . insulin aspart (novoLOG) injection 0-9 Units  0-9 Units Subcutaneous TID WC Elease EtienneHongalgi, Anand D, MD   1 Units at 08/16/17 1236  . losartan (COZAAR) tablet 50 mg  50 mg Oral Daily Elease EtienneHongalgi, Anand D, MD   50 mg at 08/16/17 0836  . metoprolol tartrate (LOPRESSOR) tablet 25 mg  25 mg Oral BID Elease EtienneHongalgi, Anand D, MD   25 mg at 08/16/17 0835  . morphine (MS CONTIN) 12 hr tablet 15 mg  15 mg Oral Wandalee FerdinandBH-q7a Hongalgi, Anand D, MD   15 mg at 08/16/17 [id number]  . morphine 2 MG/ML injection 1 mg  1 mg Intravenous Q4H PRN Hongalgi, Maximino GreenlandAnand D, MD      . multivitamins with iron tablet 1 tablet  1 tablet Oral Daily Elease EtienneHongalgi, Anand D, MD   1 tablet at  08/16/17 [id number]  . nitroGLYCERIN (NITROSTAT) SL tablet 0.4 mg  0.4 mg Sublingual Q5 min PRN Hongalgi, Maximino GreenlandAnand D, MD      . ondansetron (ZOFRAN) tablet 4 mg  4 mg Oral Q6H PRN Hongalgi, Maximino GreenlandAnand D, MD       Or  . ondansetron (ZOFRAN) injection 4 mg  4 mg Intravenous Q6H PRN Hongalgi, Anand D, MD      . polyethylene glycol (MIRALAX / GLYCOLAX) packet 17 g  17 g Oral Daily PRN Hongalgi, Anand D, MD      . polyethylene glycol (MIRALAX / GLYCOLAX) packet 17 g  17 g Oral Daily Elease EtienneHongalgi, Anand D, MD   17 g at 08/16/17 0836  . polyvinyl alcohol (LIQUIFILM TEARS) 1.4 %  ophthalmic solution 1 drop  1 drop Both Eyes QID Elease EtienneHongalgi, Anand D, MD   1 drop at 08/16/17 1236  . senna (SENOKOT) tablet 8.6 mg  1 tablet Oral BID Elease EtienneHongalgi, Anand D, MD   8.6 mg at 08/16/17 0834  . trimethoprim (TRIMPEX) tablet 100 mg  100 mg Oral Daily Elease EtienneHongalgi, Anand D, MD   100 mg at 08/16/17 [id number]     Discharge Medications: Please see discharge summary for a list of discharge medications.  Relevant Imaging Results:  Relevant Lab Results:   Additional Information SSN: [id number]  Maree KrabbeBridget A Olando Willems, LCSW

## 2017-08-16 NOTE — Clinical Social Work Note (Signed)
Clinical Social Work Assessment  Patient Details  Name: Abigail PitcherFrances L Alcorn MRN: [id number] Date of Birth: [birth year]  Date of referral:  08/16/17               Reason for consult:  Facility Placement                Permission sought to share information with:  Family Supports Permission granted to share information::  Yes, Verbal Permission Granted  Name::        Agency::     Relationship::     Contact Information:     Housing/Transportation Living arrangements for the past 2 months:  [facility] Source of Information:  Patient Patient Interpreter Needed:  None Criminal Activity/Legal Involvement Pertinent to Current Situation/Hospitalization:  No - Comment as needed Significant Relationships:  Adult Children Lives with:  Facility Resident Do you feel safe going back to the place where you live?  Yes Need for family participation in patient care:  No (Coment)  Care giving concerns:  No family present at bedside. [facility]    Social Worker assessment / plan:  CSW spoke with pt at bedside. CSW confirmed [facility] is a [facility]--pt plans to return. Pt has no further concerns at this time.   [facility] with [facility] can return at d/c--anticipated d/c 12/29.  Employment status:  Retired Health and safety inspectornsurance information:  Medicare PT Recommendations:  Skilled Nursing Facility Information / Referral to community resources:     Patient/Family's Response to care:  Pt verbalized understanding of CSW role and expressed appreciation for support. Pt denies any concern regarding pt care at this time.   Patient/Family's Understanding of and Emotional Response to Diagnosis, Current Treatment, and Prognosis:  Pt understanding and realistic regarding physical limitations. Pt understands the need for SNF placement at d/c. Pt agreeable to SNF placement at d/c, at this time. Pt's responses emotionally appropriate during conversation with CSW.  Pt denies any concern regarding treatment plan at this time. CSW will continue to provide support and facilitate d/c needs.   Emotional Assessment Appearance:  Appears stated age Attitude/Demeanor/Rapport:  (Patient was appropriate.) Affect (typically observed):  Accepting, Appropriate, Calm Orientation:  Oriented to Self, Oriented to Place, Oriented to  Time, Oriented to Situation Alcohol / Substance use:  Not Applicable Psych involvement (Current and /or in the community):  No (Comment)  Discharge Needs  Concerns to be addressed:  No discharge needs identified Readmission within the last 30 days:  No Current discharge risk:  Dependent with Mobility Barriers to Discharge:  Continued Medical Work up   Pacific MutualBridget A Mechelle Pates, LCSW 08/16/2017, 4:05 PM

## 2017-08-16 NOTE — Progress Notes (Signed)
PROGRESS NOTE  Abigail Brooks NWG:[id number] DOB: 06/17/1935 DOA: 08/14/2017 PCP: Jacques Navy, MD   LOS: 1 day   Brief Narrative / Interim history: 82 year old widowed female, [facility] the [facility], ambulates with the help of a walker, PMH of DM 2/IDDM, HTN,stroke with residual left hemiparesis, hypothyroid, GERD, anxiety & depression, CAD, chronic diastolic CHF, dementia with behavioral abnormalities, chronic constipation, chronic pain on MS Contin who presented to ED due to abdominal pain and confusion that started on the morning of hospital admission. Admitted for evaluation of abdominal pain and confusion.   Assessment & Plan: Principal Problem:   Abdominal pain Active Problems:   CAD (coronary artery disease)   Chronic diastolic CHF (congestive heart failure) (HCC)   Constipation   Essential hypertension, benign   Overactive bladder   Dementia with behavioral disturbance   Type 2 diabetes mellitus with renal manifestations, controlled (HCC)   Acute renal failure superimposed on stage 3 chronic kidney disease (HCC)   HLD (hyperlipidemia)   Abdominal pain  -Unclear etiology. DD: Food poisoning/acute GE, chronic constipation, overactive bladder. Renal colic, UTI and mesenteric ischemia seem less likely. Abdominal pain has progressively improved but still has mild pain. Has tolerated clear and full liquid, will advance diet to soft today. Had a large BM 12/27. Aggressive bowel regimen. CT abdomen without acute findings. Acute confusion complicating underlying moderate dementia -Unclear etiology. May have been related to acute pain, dehydration and mild acute on chronic kidney disease complicating underlying dementia. No new focal neurological deficits. CT head without acute findings. No infectious etiology noted.  -Very close to baseline per patient's son however still mildly confused Dehydration  -Improved after gentle IV fluids. Encourage oral  fluid intake. Acute on stage III chronic kidney disease  -Baseline creatinine not known. Last creatinine in system 1.07 from 2016. Presented with creatinine of 1.29. May be due to dehydration, diuretics and ARB PTA. Briefly hydrate with IV fluids. Creatinine has improved. Follow BMP periodically. Deconditioning/presyncope  -As discussed with PT 12/27, patient reported dizziness/lightheadedness on sitting and standing for greater than 15-20 seconds but no orthostatic changes.  -awaiting re-evaluation by PT today Essential hypertension -Patient on polypharmacy at SNF. Resumed hydralazine, Hyzaar, metoprolol. Monitor. -Blood pressure 157/54 this morning, keep on same regimen Type II DM/IDDM -Patient on Lantus, SSI and metformin PTA. Hold metformin and Lantus. Patient's CBGs reasonably well controlled as below just on SSI. Adjust insulin's as needed. Chronic diastolic CHF/CAD -Temporarily holding Lasix. Clinically euvolemic. Hyperlipidemia  -Continue atorvastatin and fenofibrate. Depression/anxiety/dementia with behavioral disturbances  -Continue home dose of Wellbutrin, clonazepam. Overactive bladder: Continue Toviaz History of recurrent UTIs: Continue suppressive therapy with trimethoprim. Chronic pain: Continue home dose of MS Contin. Chronic constipation: Aggressive bowel regimen.    DVT prophylaxis: Lovenox Code Status: Full code Family Communication: d/w son Onalee Hua over the phone  Disposition Plan: SNF 1 day  Consultants:   None   Procedures:   None   Antimicrobials:  Trimethoprim    Subjective: -Feels better, able to tolerate a diet, no nausea or vomiting.  Abdominal pain is better.  No chest pain or shortness of breath.  Objective: Vitals:   08/15/17 1329 08/15/17 1810 08/15/17 2127 08/16/17 0551  BP: (!) 172/60 (!) 187/72 (!) 156/58 (!) 157/54  Pulse: 60  71 (!) 59  Resp: 20  17 17   Temp: 98.3 F (36.8 C)  98 F (36.7 C) 98.6 F (37 C)  TempSrc: Oral  Oral  Oral  SpO2: 100%  97% 96%  Weight:    91.3 kg (201 lb 4.5 oz)  Height:        Intake/Output Summary (Last 24 hours) at 08/16/2017 1220 Last data filed at 08/16/2017 0551 Gross per 24 hour  Intake 1228.33 ml  Output 1350 ml  Net -121.67 ml   Filed Weights   08/14/17 2235 08/15/17 0553 08/16/17 0551  Weight: 89.4 kg (197 lb 1.5 oz) 90.3 kg (199 lb 1.2 oz) 91.3 kg (201 lb 4.5 oz)    Examination:  Constitutional: NAD Eyes: lids and conjunctivae normal ENMT: Mucous membranes are moist. Respiratory: clear to auscultation bilaterally, no wheezing, no crackles. Normal respiratory effort.   Cardiovascular: Regular rate and rhythm, no murmurs / rubs / gallops. No LE edema. 2+ pedal pulses.  Abdomen: mild tenderness throughout. Bowel sounds positive.  Musculoskeletal: no clubbing / cyanosis.  Skin: no rashes, lesions, ulcers. No induration Neurologic: non focal  Psychiatric: Normal judgment and insight. Alert and oriented x 3. Normal mood.    Data Reviewed: I have independently reviewed following labs and imaging studies   CBC: Recent Labs  Lab 08/14/17 1400 08/15/17 0441  WBC 7.3 6.9  NEUTROABS 5.1  --   HGB 13.5 12.2  HCT 41.3 38.0  MCV 90.0 91.1  PLT 209 201   Basic Metabolic Panel: Recent Labs  Lab 08/14/17 1400 08/15/17 0441  NA 143 141  K 4.8 4.0  CL 107 109  CO2 27 25  GLUCOSE 136* 113*  BUN 20 15  CREATININE 1.29* 1.15*  CALCIUM 9.3 8.9   GFR: Estimated Creatinine Clearance: 39.3 mL/min (A) (by C-G formula based on SCr of 1.15 mg/dL (H)). Liver Function Tests: Recent Labs  Lab 08/14/17 1400  AST 20  ALT 18  ALKPHOS 39  BILITOT 0.7  PROT 6.5  ALBUMIN 3.7   Recent Labs  Lab 08/14/17 1400  LIPASE 25   No results for input(s): AMMONIA in the last 168 hours. Coagulation Profile: No results for input(s): INR, PROTIME in the last 168 hours. Cardiac Enzymes: No results for input(s): CKTOTAL, CKMB, CKMBINDEX, TROPONINI in the last 168 hours. BNP  (last 3 results) No results for input(s): PROBNP in the last 8760 hours. HbA1C: No results for input(s): HGBA1C in the last 72 hours. CBG: Recent Labs  Lab 08/15/17 1153 08/15/17 1654 08/15/17 2130 08/16/17 0749 08/16/17 1208  GLUCAP 130* 155* 168* 137* 127*   Lipid Profile: No results for input(s): CHOL, HDL, LDLCALC, TRIG, CHOLHDL, LDLDIRECT in the last 72 hours. Thyroid Function Tests: No results for input(s): TSH, T4TOTAL, FREET4, T3FREE, THYROIDAB in the last 72 hours. Anemia Panel: No results for input(s): VITAMINB12, FOLATE, FERRITIN, TIBC, IRON, RETICCTPCT in the last 72 hours. Urine analysis:    Component Value Date/Time   COLORURINE YELLOW 08/14/2017 1252   APPEARANCEUR CLEAR 08/14/2017 1252   APPEARANCEUR Clear 11/10/2013 1606   LABSPEC 1.010 08/14/2017 1252   PHURINE 5.0 08/14/2017 1252   GLUCOSEU NEGATIVE 08/14/2017 1252   HGBUR NEGATIVE 08/14/2017 1252   BILIRUBINUR NEGATIVE 08/14/2017 1252   BILIRUBINUR Negative 06/16/2014 1639   BILIRUBINUR Negative 11/10/2013 1606   KETONESUR NEGATIVE 08/14/2017 1252   PROTEINUR NEGATIVE 08/14/2017 1252   UROBILINOGEN 0.2 11/14/2014 1620   NITRITE NEGATIVE 08/14/2017 1252   LEUKOCYTESUR NEGATIVE 08/14/2017 1252   LEUKOCYTESUR 1+ (A) 11/10/2013 1606   Sepsis Labs: Invalid input(s): PROCALCITONIN, LACTICIDVEN  Recent Results (from the past 240 hour(s))  MRSA PCR Screening     Status: None   Collection Time: 08/14/17  10:28 PM  Result Value Ref Range Status   MRSA by PCR NEGATIVE NEGATIVE Final    Comment:        The GeneXpert MRSA Assay (FDA approved for NASAL specimens only), is one component of a comprehensive MRSA colonization surveillance program. It is not intended to diagnose MRSA infection nor to guide or monitor treatment for MRSA infections.       Radiology Studies: Ct Head Wo Contrast  Result Date: 08/14/2017 CLINICAL DATA:  Altered mental status EXAM: CT HEAD WITHOUT CONTRAST TECHNIQUE:  Contiguous axial images were obtained from the base of the skull through the vertex without intravenous contrast. COMPARISON:  11/14/2014, 08/21/2014 FINDINGS: Brain: Mild motion degradation. Study limited by prior intravenous contrast administration. No large vessel territorial infarction. Encephalomalacia in the right frontal lobe at the site of prior infarct. Mild small vessel ischemic changes of the white matter. Mild to moderate atrophy. Ventricles are nonenlarged. Vascular: Increased density within the dural venous sinuses and circle of Willis. Carotid artery calcification Skull: No fracture.  Small amount of fluid in the left mastoid. Sinuses/Orbits: Mild mucosal thickening in the ethmoid sinuses. No acute orbital abnormality. Other: None IMPRESSION: 1. The study is limited by patient motion and prior administration of intravenous contrast. 2. No definite acute intracranial abnormality is seen 3. Old right frontal lobe infarct. Electronically Signed   By: Jasmine PangKim  Fujinaga M.D.   On: 08/14/2017 17:16   Ct Abdomen Pelvis W Contrast  Result Date: 08/14/2017 CLINICAL DATA:  Abdominal pain. EXAM: CT ABDOMEN AND PELVIS WITH CONTRAST TECHNIQUE: Multidetector CT imaging of the abdomen and pelvis was performed using the standard protocol following bolus administration of intravenous contrast. CONTRAST:  100mL ISOVUE-300 IOPAMIDOL (ISOVUE-300) INJECTION 61% COMPARISON:  CT scan dated 06/11/2016 FINDINGS: Lower chest: No acute abnormality. Aortic atherosclerosis. Coronary artery calcification. Heart size is normal. Hepatobiliary: Hepatic steatosis. Stones in the otherwise normal gallbladder. No bile duct dilatation. Pancreas: Normal. Spleen: Normal. Adrenals/Urinary Tract: Normal adrenal glands. Normal appearing right kidney. Left renal cortical atrophy with the 14 mm stone in the lower pole of the small cyst in the lateral aspect of the lower pole measuring 14 mm. No hydronephrosis. 13 mm left lateral bladder  diverticulum. Evidence of previous bladder suspension. Stomach/Bowel: Stomach is within normal limits. Appendix has been removed. No evidence of bowel wall thickening, distention, or inflammatory changes. Vascular/Lymphatic: Aortic atherosclerosis. No enlarged abdominal or pelvic lymph nodes. Other: No abdominal wall hernia or abnormality. Musculoskeletal: No acute abnormalities. Severe degenerative disc disease in the lower lumbar spine. Grade 1 spondylolisthesis at L4-5. IMPRESSION: 1. Cholelithiasis. 2. 14 mm stone in the atrophic left kidney.  No obstruction. 3. Aortic atherosclerosis. 4. Multilevel degenerative disc disease, most severe at L4-5. Electronically Signed   By: Francene BoyersJames  Maxwell M.D.   On: 08/14/2017 16:06   Dg Abd Portable 1v  Result Date: 08/15/2017 CLINICAL DATA:  82 year old female with history of left lower quadrant pain since yesterday evening. Nausea but no vomiting. EXAM: PORTABLE ABDOMEN - 1 VIEW COMPARISON:  CT the abdomen and pelvis 08/14/2017. FINDINGS: Gas and stool are seen scattered throughout the colon extending to the level of the distal rectum. No pathologic distension of small bowel is noted. A few nondilated gas-filled loops of small bowel are noted part (nonspecific). No gross evidence of pneumoperitoneum. Soft tissue anchor is projecting over the symphysis pubis incidentally noted. IMPRESSION: 1. Nonspecific, nonobstructive bowel gas pattern, as above. 2. No pneumoperitoneum. Electronically Signed   By: Brayton Marsaniel  Entrikin M.D.  On: 08/15/2017 11:05   Scheduled Meds: . atorvastatin  10 mg Oral q1800  . buPROPion  100 mg Oral Daily  . clonazePAM  0.5 mg Oral Daily  . clopidogrel  75 mg Oral Daily  . enoxaparin (LOVENOX) injection  40 mg Subcutaneous Q24H  . fenofibrate  54 mg Oral Daily  . fesoterodine  4 mg Oral QPM  . hydrALAZINE  25 mg Oral TID  . hydrochlorothiazide  12.5 mg Oral Daily  . insulin aspart  0-9 Units Subcutaneous TID WC  . losartan  50 mg Oral  Daily  . metoprolol tartrate  25 mg Oral BID  . morphine  15 mg Oral [id number]  . multivitamins with iron  1 tablet Oral Daily  . polyethylene glycol  17 g Oral Daily  . polyvinyl alcohol  1 drop Both Eyes QID  . senna  1 tablet Oral BID  . trimethoprim  100 mg Oral Daily   Continuous Infusions:   Pamella Pert, MD, PhD [facility] Pager [phone]  If 7PM-7AM, please contact night-coverage [email or web address] Password [email or web address] 08/16/2017, 12:20 PM

## 2017-08-17 DIAGNOSIS — E1122 Type 2 diabetes mellitus with diabetic chronic kidney disease: Secondary | ICD-10-CM

## 2017-08-17 DIAGNOSIS — I10 Essential (primary) hypertension: Secondary | ICD-10-CM

## 2017-08-17 DIAGNOSIS — Z794 Long term (current) use of insulin: Secondary | ICD-10-CM

## 2017-08-17 LAB — BASIC METABOLIC PANEL
Anion gap: 9 (ref 5–15)
BUN: 15 mg/dL (ref 6–20)
CALCIUM: 9 mg/dL (ref 8.9–10.3)
CO2: 25 mmol/L (ref 22–32)
CREATININE: 1.37 mg/dL — AB (ref 0.44–1.00)
Chloride: 107 mmol/L (ref 101–111)
GFR, EST AFRICAN AMERICAN: 41 mL/min — AB (ref >60)
GFR, EST NON AFRICAN AMERICAN: 35 mL/min — AB (ref >60)
Glucose, Bld: 130 mg/dL — ABNORMAL HIGH (ref 65–99)
Potassium: 3.8 mmol/L (ref 3.5–5.1)
SODIUM: 141 mmol/L (ref 135–145)

## 2017-08-17 LAB — GLUCOSE, CAPILLARY
GLUCOSE-CAPILLARY: 133 mg/dL — AB (ref 65–99)
Glucose-Capillary: 182 mg/dL — ABNORMAL HIGH (ref 65–99)

## 2017-08-17 MED ORDER — POLYETHYLENE GLYCOL 3350 17 G PO PACK: 17.0000 g | PACK | Freq: Every day | ORAL | 0 refills | Status: AC

## 2017-08-17 MED ORDER — OXYCODONE-ACETAMINOPHEN 5-325 MG PO TABS: 1.0000 | ORAL_TABLET | Freq: Three times a day (TID) | ORAL | 0 refills | Status: AC | PRN

## 2017-08-17 MED ORDER — MORPHINE SULFATE ER 15 MG PO TBCR: 15.0000 mg | EXTENDED_RELEASE_TABLET | ORAL | 0 refills | Status: AC

## 2017-08-17 NOTE — Care Management Note (Signed)
Case Management Note  Patient Details  Name: Abigail Brooks MRN: [id number] Date of Birth: 06-Mar-1935  Subjective/Objective:                 Patient with order to discharge to Skilled Nursing Facility (SNF). SNF discharge facilitated through Clinical Social Work (CSW). Please refer to CSW notes for disposition plan and direct questions CSW on call accordingly. CM signing off   Action/Plan:   Expected Discharge Date:  08/17/17               Expected Discharge Plan:  Skilled Nursing Facility  In-House Referral:  Clinical Social Work  Discharge planning Services  CM Consult  Post Acute Care Choice:    Choice offered to:     DME Arranged:    DME Agency:     HH Arranged:    HH Agency:     Status of Service:  Completed, signed off  If discussed at [employer] of [employer] Meetings, dates discussed:    Additional Comments:  Lawerance SabalDebbie Annette Liotta, RN 08/17/2017, 1:03 PM

## 2017-08-17 NOTE — Progress Notes (Signed)
Patient will Discharge To: [facility] Anticipated DC Date:08-17-17 Family Notified:yes, son Jamelle HaringDavid Gilpatrick [phone] Transport By: Sharin MonsPTAR   Per MD [facility] RN, patient, patient's family, and facility notified of DC. Assessment, Fl2/Pasrr, and Discharge Summary sent to facility. RN given number for report [phone]). DC packet on chart. Ambulance transport requested for patient.   CSW signing off.  Budd Palmerara Ash Mcelwain LCSWA [phone]

## 2017-08-17 NOTE — Discharge Summary (Signed)
Physician Discharge Summary  Abigail Brooks:[id number] DOB: [birth year] DOA: 08/14/2017  PCP: Abigail Navy, MD  Admit date: 08/14/2017 Discharge date: 08/17/2017  Admitted From: SNF Disposition:  SNF  Recommendations for Outpatient Follow-up:  1. Follow up with PCP in 1-2 weeks 2. Please repeat BMP in 3-4 days 3. Please ensure patient has daily BMs  Home Health: none Equipment/Devices: none  Discharge Condition: stable CODE STATUS: Full code Diet recommendation: heart healthy  HPI: Per Dr. Waymon Amato, Abigail Brooks is a 82 year old widowed female, resident of [facility] for the last3.5 years, ambulates with the help of a walker, PMH of DM 2, HTN,stroke with residual left hemiparesis, hypothyroid, GERD, anxiety & depression, CAD, chronic diastolic CHF, dementia with behavioral abnormalities who presented to ED due to above complaints. History obtained from EDP and patient's son/healthcare power of attorney at bedside. Patient is a poor historian due to dementia with new onset acute confusion. Patient was in her usual state of health all day yesterday. Son took her home and she had a good time with her family including Christmas dinner. She had no complaints. She is chronically constipated and takes prune juice. She had a BM at his house yesterday. Around 11 AM today, SNF patient was complaining of abdominal pain, notwanting to eat, difficulty walking and more confused than usual. She was sent to the ED for further evaluation. Patient denies nausea, vomiting. Denies urinary frequency, dysuria. No reported fever or chills. No report of chest pain or dyspnea. She underwent extensive evaluation in the ED. He states that patient's mental status is significantly improved now and almost back to her baseline. Earlier on, she was confused and was hallucinating. He denies any recent change in medications. She also reports improvement in her abdominal pain and is hungry and wants to eat.  She still has some lower abdominal pain. No sick contacts with similar complaints. ED Course: afebrile, hemodynamically stable. CBC is normal. BMP only significant for mildly elevated creatinine. Lipase normal. UA not indicative of UTI.CT abdomen and pelvis with contrast without acute findings. CT head: Limited study but no acute abnormalities.  Hospital Course: Discharge Diagnoses:  Principal Problem:   Abdominal pain Active Problems:   CAD (coronary artery disease)   Chronic diastolic CHF (congestive heart failure) (HCC)   Constipation   Essential hypertension, benign   Overactive bladder   Dementia with behavioral disturbance   Type 2 diabetes mellitus with renal manifestations, controlled (HCC)   Acute renal failure superimposed on stage 3 chronic kidney disease (HCC)   HLD (hyperlipidemia)   Abdominal pain  -Unclear etiology. DD: Food poisoning/acute GE, chronic constipation, overactive bladder. Renal colic, UTI and mesenteric ischemia seem less likely. Abdominal pain has resolved. Has tolerated regular diet. Had a large BM 12/27. Aggressive bowel regimen. CT abdomen without acute findings. Acute confusion complicating underlying moderate dementia -Unclear etiology. May have been related to acute pain, dehydration and mild acute on chronic kidney disease complicating underlying dementia. No new focal neurological deficits. CT head without acute findings. No infectious etiology noted.  -Very close to baseline per family Dehydration  -Improved after gentle IV fluids. Encourage oral fluid intake. Acute on stage III chronic kidney disease -Baseline creatinine not known, suspect close to 1.3. Overall stable  Deconditioning/presyncope -resolved Essential hypertension -resume home regimen  Type II DM/IDDM -resume home regimen  Chronic diastolic CHF/CAD -euvolemic Hyperlipidemia  -Continue atorvastatin and fenofibrate. Depression/anxiety/dementia with behavioral  disturbances -Continue home dose of Wellbutrin, clonazepam. Overactive bladder:Continue Toviaz History  of recurrent UTIs:Continue suppressive therapy with trimethoprim. Chronic pain:Continue home dose of MS Contin. Chronic constipation: Aggressive bowel regimen.   Discharge Instructions   Allergies as of 08/17/2017      Reactions   Morphine And Related Other (See Comments)   Makes her crazy   Enalapril Swelling   Facial swelling      Medication List    TAKE these medications   atorvastatin 10 MG tablet Commonly known as:  LIPITOR TAKE 1 TABLET BY MOUTH EVERY DAY AT 6PM What changed:  See the new instructions.   buPROPion 100 MG 12 hr tablet Commonly known as:  WELLBUTRIN SR Take 1 tablet (100 mg total) by mouth daily.   CALCIUM + D PO Take 1 tablet by mouth daily. Calcium 315 mg,Vitamin D3 250 I.U.   clonazePAM 0.5 MG tablet Commonly known as:  KLONOPIN Take 0.5 mg by mouth daily.   clopidogrel 75 MG tablet Commonly known as:  PLAVIX TAKE 1 TABLET BY MOUTH EVERY DAY What changed:    how much to take  how to take this  when to take this   fenofibrate 54 MG tablet Take 1 tablet (54 mg total) by mouth daily.   ferrous sulfate 325 (65 FE) MG tablet TAKE 1 TABLET BY MOUTH EVERY MORNING WITH BREAKFAST   furosemide 20 MG tablet Commonly known as:  LASIX Take 20 mg by mouth. Three times per week.   hydrALAZINE 25 MG tablet Commonly known as:  APRESOLINE Take 25 mg by mouth 3 (three) times daily.   hydrOXYzine 25 MG tablet Commonly known as:  ATARAX/VISTARIL Take 25 mg by mouth every 8 (eight) hours as needed for itching.   insulin glargine 100 UNIT/ML injection Commonly known as:  LANTUS Inject 36 Units into the skin at bedtime.   losartan-hydrochlorothiazide 50-12.5 MG tablet Commonly known as:  HYZAAR Take 1 tablet by mouth daily.   metFORMIN 500 MG tablet Commonly known as:  GLUCOPHAGE Take 1 tablet (500 mg total) by mouth daily with  breakfast.   metoprolol tartrate 25 MG tablet Commonly known as:  LOPRESSOR Take 25 mg by mouth 2 (two) times daily.   morphine 15 MG 12 hr tablet Commonly known as:  MS CONTIN Take 1 tablet (15 mg total) by mouth every morning.   MULTI-VITAMIN/IRON Tabs Take 1 tablet by mouth every morning.   nitroGLYCERIN 0.4 MG SL tablet Commonly known as:  NITROSTAT Place 0.4 mg under the tongue every 5 (five) minutes as needed for chest pain. Reported on 10/12/2015   NOVOLOG FLEXPEN 100 UNIT/ML FlexPen Generic drug:  insulin aspart Inject 0-12 Units into the skin See admin instructions. SSI- 151-200=4 units 201-250=6 units 251-300=8 units 301-350=10 units 351-400=12 units   oxyCODONE-acetaminophen 5-325 MG tablet Commonly known as:  PERCOCET/ROXICET Take 1 tablet by mouth every 8 (eight) hours as needed.   polyethylene glycol packet Commonly known as:  MIRALAX / GLYCOLAX Take 17 g by mouth daily. What changed:    when to take this  reasons to take this   REFRESH TEARS 0.5 % Soln Generic drug:  carboxymethylcellulose Place 1 drop into both eyes 4 (four) times daily.   senna 8.6 MG Tabs tablet Commonly known as:  SENOKOT Take 1 tablet by mouth every morning.   TOVIAZ 4 MG Tb24 tablet Generic drug:  fesoterodine Take 4 mg by mouth every evening.   trimethoprim 100 MG tablet Commonly known as:  TRIMPEX Take 100 mg by mouth daily.   UNABLE TO FIND  Take 30 mLs by mouth daily. UTI-Stat 3,875       Consultations:  None   Procedures/Studies:  Ct Head Wo Contrast  Result Date: 08/14/2017 CLINICAL DATA:  Altered mental status EXAM: CT HEAD WITHOUT CONTRAST TECHNIQUE: Contiguous axial images were obtained from the base of the skull through the vertex without intravenous contrast. COMPARISON:  11/14/2014, 08/21/2014 FINDINGS: Brain: Mild motion degradation. Study limited by prior intravenous contrast administration. No large vessel territorial infarction. Encephalomalacia in  the right frontal lobe at the site of prior infarct. Mild small vessel ischemic changes of the white matter. Mild to moderate atrophy. Ventricles are nonenlarged. Vascular: Increased density within the dural venous sinuses and circle of Willis. Carotid artery calcification Skull: No fracture.  Small amount of fluid in the left mastoid. Sinuses/Orbits: Mild mucosal thickening in the ethmoid sinuses. No acute orbital abnormality. Other: None IMPRESSION: 1. The study is limited by patient motion and prior administration of intravenous contrast. 2. No definite acute intracranial abnormality is seen 3. Old right frontal lobe infarct. Electronically Signed   By: Jasmine PangKim  Fujinaga M.D.   On: 08/14/2017 17:16   Ct Abdomen Pelvis W Contrast  Result Date: 08/14/2017 CLINICAL DATA:  Abdominal pain. EXAM: CT ABDOMEN AND PELVIS WITH CONTRAST TECHNIQUE: Multidetector CT imaging of the abdomen and pelvis was performed using the standard protocol following bolus administration of intravenous contrast. CONTRAST:  100mL ISOVUE-300 IOPAMIDOL (ISOVUE-300) INJECTION 61% COMPARISON:  CT scan dated 06/11/2016 FINDINGS: Lower chest: No acute abnormality. Aortic atherosclerosis. Coronary artery calcification. Heart size is normal. Hepatobiliary: Hepatic steatosis. Stones in the otherwise normal gallbladder. No bile duct dilatation. Pancreas: Normal. Spleen: Normal. Adrenals/Urinary Tract: Normal adrenal glands. Normal appearing right kidney. Left renal cortical atrophy with the 14 mm stone in the lower pole of the small cyst in the lateral aspect of the lower pole measuring 14 mm. No hydronephrosis. 13 mm left lateral bladder diverticulum. Evidence of previous bladder suspension. Stomach/Bowel: Stomach is within normal limits. Appendix has been removed. No evidence of bowel wall thickening, distention, or inflammatory changes. Vascular/Lymphatic: Aortic atherosclerosis. No enlarged abdominal or pelvic lymph nodes. Other: No abdominal wall  hernia or abnormality. Musculoskeletal: No acute abnormalities. Severe degenerative disc disease in the lower lumbar spine. Grade 1 spondylolisthesis at L4-5. IMPRESSION: 1. Cholelithiasis. 2. 14 mm stone in the atrophic left kidney.  No obstruction. 3. Aortic atherosclerosis. 4. Multilevel degenerative disc disease, most severe at L4-5. Electronically Signed   By: Francene BoyersJames  Maxwell M.D.   On: 08/14/2017 16:06   Dg Abd Portable 1v  Result Date: 08/15/2017 CLINICAL DATA:  82 year old female with history of left lower quadrant pain since yesterday evening. Nausea but no vomiting. EXAM: PORTABLE ABDOMEN - 1 VIEW COMPARISON:  CT the abdomen and pelvis 08/14/2017. FINDINGS: Gas and stool are seen scattered throughout the colon extending to the level of the distal rectum. No pathologic distension of small bowel is noted. A few nondilated gas-filled loops of small bowel are noted part (nonspecific). No gross evidence of pneumoperitoneum. Soft tissue anchor is projecting over the symphysis pubis incidentally noted. IMPRESSION: 1. Nonspecific, nonobstructive bowel gas pattern, as above. 2. No pneumoperitoneum. Electronically Signed   By: Trudie Reedaniel  Entrikin M.D.   On: 08/15/2017 11:05     Subjective: - no chest pain, shortness of breath, no abdominal pain, nausea or vomiting.   Discharge Exam: Vitals:   08/16/17 1425 08/16/17 2149  BP: (!) 146/49 (!) 136/47  Pulse: 62 65  Resp: 17 17  Temp: 99 F (37.2  C) 98.6 F (37 C)  SpO2: 93% 96%    General: Pt is alert, awake, not in acute distress Cardiovascular: RRR, S1/S2 +, no rubs, no gallops Respiratory: CTA bilaterally, no wheezing, no rhonchi Abdominal: Soft, NT, ND, bowel sounds +   The results of significant diagnostics from this hospitalization (including imaging, microbiology, ancillary and laboratory) are listed below for reference.     Microbiology: Recent Results (from the past 240 hour(s))  MRSA PCR Screening     Status: None   Collection  Time: 08/14/17 10:28 PM  Result Value Ref Range Status   MRSA by PCR NEGATIVE NEGATIVE Final    Comment:        The GeneXpert MRSA Assay (FDA approved for NASAL specimens only), is one component of a comprehensive MRSA colonization surveillance program. It is not intended to diagnose MRSA infection nor to guide or monitor treatment for MRSA infections.      Labs: BNP (last 3 results) No results for input(s): BNP in the last 8760 hours. Basic Metabolic Panel: Recent Labs  Lab 08/14/17 1400 08/15/17 0441 08/17/17 0325  NA 143 141 141  K 4.8 4.0 3.8  CL 107 109 107  CO2 27 25 25   GLUCOSE 136* 113* 130*  BUN 20 15 15   CREATININE 1.29* 1.15* 1.37*  CALCIUM 9.3 8.9 9.0   Liver Function Tests: Recent Labs  Lab 08/14/17 1400  AST 20  ALT 18  ALKPHOS 39  BILITOT 0.7  PROT 6.5  ALBUMIN 3.7   Recent Labs  Lab 08/14/17 1400  LIPASE 25   No results for input(s): AMMONIA in the last 168 hours. CBC: Recent Labs  Lab 08/14/17 1400 08/15/17 0441  WBC 7.3 6.9  NEUTROABS 5.1  --   HGB 13.5 12.2  HCT 41.3 38.0  MCV 90.0 91.1  PLT 209 201   Cardiac Enzymes: No results for input(s): CKTOTAL, CKMB, CKMBINDEX, TROPONINI in the last 168 hours. BNP: Invalid input(s): POCBNP CBG: Recent Labs  Lab 08/16/17 1208 08/16/17 1703 08/16/17 2146 08/17/17 0755 08/17/17 1205  GLUCAP 127* 178* 194* 133* 182*   D-Dimer No results for input(s): DDIMER in the last 72 hours. Hgb A1c No results for input(s): HGBA1C in the last 72 hours. Lipid Profile No results for input(s): CHOL, HDL, LDLCALC, TRIG, CHOLHDL, LDLDIRECT in the last 72 hours. Thyroid function studies No results for input(s): TSH, T4TOTAL, T3FREE, THYROIDAB in the last 72 hours.  Invalid input(s): FREET3 Anemia work up No results for input(s): VITAMINB12, FOLATE, FERRITIN, TIBC, IRON, RETICCTPCT in the last 72 hours. Urinalysis    Component Value Date/Time   COLORURINE YELLOW 08/14/2017 1252    APPEARANCEUR CLEAR 08/14/2017 1252   APPEARANCEUR Clear 11/10/2013 1606   LABSPEC 1.010 08/14/2017 1252   PHURINE 5.0 08/14/2017 1252   GLUCOSEU NEGATIVE 08/14/2017 1252   HGBUR NEGATIVE 08/14/2017 1252   BILIRUBINUR NEGATIVE 08/14/2017 1252   BILIRUBINUR Negative 06/16/2014 1639   BILIRUBINUR Negative 11/10/2013 1606   KETONESUR NEGATIVE 08/14/2017 1252   PROTEINUR NEGATIVE 08/14/2017 1252   UROBILINOGEN 0.2 11/14/2014 1620   NITRITE NEGATIVE 08/14/2017 1252   LEUKOCYTESUR NEGATIVE 08/14/2017 1252   LEUKOCYTESUR 1+ (A) 11/10/2013 1606   Sepsis Labs Invalid input(s): PROCALCITONIN,  WBC,  LACTICIDVEN   Time coordinating discharge: 45 minutes  SIGNED:  Pamella Pert, MD  [facility] 08/17/2017, 12:19 PM Pager [phone]  If 7PM-7AM, please contact night-coverage [email or web address] Password [email or web address]

## 2017-08-17 NOTE — Progress Notes (Signed)
[facility] called and patient placed in list.

## 2017-08-19 DIAGNOSIS — M6281 Muscle weakness (generalized): Secondary | ICD-10-CM | POA: Diagnosis not present

## 2017-08-19 DIAGNOSIS — E785 Hyperlipidemia, unspecified: Secondary | ICD-10-CM | POA: Diagnosis not present

## 2017-08-19 DIAGNOSIS — E860 Dehydration: Secondary | ICD-10-CM | POA: Diagnosis not present

## 2017-08-19 DIAGNOSIS — G8929 Other chronic pain: Secondary | ICD-10-CM | POA: Diagnosis not present

## 2017-08-19 DIAGNOSIS — I509 Heart failure, unspecified: Secondary | ICD-10-CM | POA: Diagnosis not present

## 2017-08-19 DIAGNOSIS — R1084 Generalized abdominal pain: Secondary | ICD-10-CM | POA: Diagnosis not present

## 2017-08-19 DIAGNOSIS — I2510 Atherosclerotic heart disease of native coronary artery without angina pectoris: Secondary | ICD-10-CM | POA: Diagnosis not present

## 2017-08-19 DIAGNOSIS — N183 Chronic kidney disease, stage 3 (moderate): Secondary | ICD-10-CM | POA: Diagnosis not present

## 2017-08-19 DIAGNOSIS — N3281 Overactive bladder: Secondary | ICD-10-CM | POA: Diagnosis not present

## 2017-08-19 DIAGNOSIS — G4751 Confusional arousals: Secondary | ICD-10-CM | POA: Diagnosis not present

## 2017-08-19 DIAGNOSIS — E119 Type 2 diabetes mellitus without complications: Secondary | ICD-10-CM | POA: Diagnosis not present

## 2017-08-19 DIAGNOSIS — I10 Essential (primary) hypertension: Secondary | ICD-10-CM | POA: Diagnosis not present

## 2017-08-21 DIAGNOSIS — R1084 Generalized abdominal pain: Secondary | ICD-10-CM | POA: Diagnosis not present

## 2017-08-21 DIAGNOSIS — I2510 Atherosclerotic heart disease of native coronary artery without angina pectoris: Secondary | ICD-10-CM | POA: Diagnosis not present

## 2017-08-21 DIAGNOSIS — E039 Hypothyroidism, unspecified: Secondary | ICD-10-CM | POA: Diagnosis not present

## 2017-08-21 DIAGNOSIS — I10 Essential (primary) hypertension: Secondary | ICD-10-CM | POA: Diagnosis not present

## 2017-08-21 DIAGNOSIS — Z79899 Other long term (current) drug therapy: Secondary | ICD-10-CM | POA: Diagnosis not present

## 2017-08-21 DIAGNOSIS — N3281 Overactive bladder: Secondary | ICD-10-CM | POA: Diagnosis not present

## 2017-08-21 DIAGNOSIS — E119 Type 2 diabetes mellitus without complications: Secondary | ICD-10-CM | POA: Diagnosis not present

## 2017-08-21 DIAGNOSIS — I509 Heart failure, unspecified: Secondary | ICD-10-CM | POA: Diagnosis not present

## 2017-08-21 DIAGNOSIS — E785 Hyperlipidemia, unspecified: Secondary | ICD-10-CM | POA: Diagnosis not present

## 2017-08-21 DIAGNOSIS — E860 Dehydration: Secondary | ICD-10-CM | POA: Diagnosis not present

## 2017-08-21 DIAGNOSIS — G4751 Confusional arousals: Secondary | ICD-10-CM | POA: Diagnosis not present

## 2017-08-21 DIAGNOSIS — D649 Anemia, unspecified: Secondary | ICD-10-CM | POA: Diagnosis not present

## 2017-08-21 DIAGNOSIS — G8929 Other chronic pain: Secondary | ICD-10-CM | POA: Diagnosis not present

## 2017-08-21 DIAGNOSIS — M6281 Muscle weakness (generalized): Secondary | ICD-10-CM | POA: Diagnosis not present

## 2017-08-21 DIAGNOSIS — N183 Chronic kidney disease, stage 3 (moderate): Secondary | ICD-10-CM | POA: Diagnosis not present

## 2017-08-22 DIAGNOSIS — E785 Hyperlipidemia, unspecified: Secondary | ICD-10-CM | POA: Diagnosis not present

## 2017-08-22 DIAGNOSIS — I10 Essential (primary) hypertension: Secondary | ICD-10-CM | POA: Diagnosis not present

## 2017-08-23 DIAGNOSIS — I2510 Atherosclerotic heart disease of native coronary artery without angina pectoris: Secondary | ICD-10-CM | POA: Diagnosis not present

## 2017-08-23 DIAGNOSIS — R1084 Generalized abdominal pain: Secondary | ICD-10-CM | POA: Diagnosis not present

## 2017-08-23 DIAGNOSIS — N3281 Overactive bladder: Secondary | ICD-10-CM | POA: Diagnosis not present

## 2017-08-23 DIAGNOSIS — G8929 Other chronic pain: Secondary | ICD-10-CM | POA: Diagnosis not present

## 2017-08-23 DIAGNOSIS — I509 Heart failure, unspecified: Secondary | ICD-10-CM | POA: Diagnosis not present

## 2017-08-23 DIAGNOSIS — E119 Type 2 diabetes mellitus without complications: Secondary | ICD-10-CM | POA: Diagnosis not present

## 2017-08-23 DIAGNOSIS — M6281 Muscle weakness (generalized): Secondary | ICD-10-CM | POA: Diagnosis not present

## 2017-08-23 DIAGNOSIS — G4751 Confusional arousals: Secondary | ICD-10-CM | POA: Diagnosis not present

## 2017-08-23 DIAGNOSIS — N183 Chronic kidney disease, stage 3 (moderate): Secondary | ICD-10-CM | POA: Diagnosis not present

## 2017-08-23 DIAGNOSIS — E860 Dehydration: Secondary | ICD-10-CM | POA: Diagnosis not present

## 2017-08-23 DIAGNOSIS — E785 Hyperlipidemia, unspecified: Secondary | ICD-10-CM | POA: Diagnosis not present

## 2017-08-23 DIAGNOSIS — I10 Essential (primary) hypertension: Secondary | ICD-10-CM | POA: Diagnosis not present

## 2017-08-27 DIAGNOSIS — E785 Hyperlipidemia, unspecified: Secondary | ICD-10-CM | POA: Diagnosis not present

## 2017-08-27 DIAGNOSIS — R1084 Generalized abdominal pain: Secondary | ICD-10-CM | POA: Diagnosis not present

## 2017-08-27 DIAGNOSIS — E119 Type 2 diabetes mellitus without complications: Secondary | ICD-10-CM | POA: Diagnosis not present

## 2017-08-27 DIAGNOSIS — N183 Chronic kidney disease, stage 3 (moderate): Secondary | ICD-10-CM | POA: Diagnosis not present

## 2017-08-27 DIAGNOSIS — N3281 Overactive bladder: Secondary | ICD-10-CM | POA: Diagnosis not present

## 2017-08-27 DIAGNOSIS — M6281 Muscle weakness (generalized): Secondary | ICD-10-CM | POA: Diagnosis not present

## 2017-08-27 DIAGNOSIS — I10 Essential (primary) hypertension: Secondary | ICD-10-CM | POA: Diagnosis not present

## 2017-08-27 DIAGNOSIS — E860 Dehydration: Secondary | ICD-10-CM | POA: Diagnosis not present

## 2017-08-27 DIAGNOSIS — I509 Heart failure, unspecified: Secondary | ICD-10-CM | POA: Diagnosis not present

## 2017-08-27 DIAGNOSIS — G4751 Confusional arousals: Secondary | ICD-10-CM | POA: Diagnosis not present

## 2017-08-27 DIAGNOSIS — G8929 Other chronic pain: Secondary | ICD-10-CM | POA: Diagnosis not present

## 2017-08-27 DIAGNOSIS — I2510 Atherosclerotic heart disease of native coronary artery without angina pectoris: Secondary | ICD-10-CM | POA: Diagnosis not present

## 2017-09-02 DIAGNOSIS — E860 Dehydration: Secondary | ICD-10-CM | POA: Diagnosis not present

## 2017-09-02 DIAGNOSIS — I2510 Atherosclerotic heart disease of native coronary artery without angina pectoris: Secondary | ICD-10-CM | POA: Diagnosis not present

## 2017-09-02 DIAGNOSIS — I10 Essential (primary) hypertension: Secondary | ICD-10-CM | POA: Diagnosis not present

## 2017-09-02 DIAGNOSIS — K5900 Constipation, unspecified: Secondary | ICD-10-CM | POA: Diagnosis not present

## 2017-09-02 DIAGNOSIS — R1084 Generalized abdominal pain: Secondary | ICD-10-CM | POA: Diagnosis not present

## 2017-09-02 DIAGNOSIS — I509 Heart failure, unspecified: Secondary | ICD-10-CM | POA: Diagnosis not present

## 2017-09-02 DIAGNOSIS — N183 Chronic kidney disease, stage 3 (moderate): Secondary | ICD-10-CM | POA: Diagnosis not present

## 2017-09-02 DIAGNOSIS — E119 Type 2 diabetes mellitus without complications: Secondary | ICD-10-CM | POA: Diagnosis not present

## 2017-09-02 DIAGNOSIS — M6281 Muscle weakness (generalized): Secondary | ICD-10-CM | POA: Diagnosis not present

## 2017-09-02 DIAGNOSIS — E785 Hyperlipidemia, unspecified: Secondary | ICD-10-CM | POA: Diagnosis not present

## 2017-09-02 DIAGNOSIS — N3281 Overactive bladder: Secondary | ICD-10-CM | POA: Diagnosis not present

## 2017-09-02 DIAGNOSIS — G4751 Confusional arousals: Secondary | ICD-10-CM | POA: Diagnosis not present

## 2017-09-11 DIAGNOSIS — G894 Chronic pain syndrome: Secondary | ICD-10-CM | POA: Diagnosis not present

## 2017-09-19 DIAGNOSIS — F339 Major depressive disorder, recurrent, unspecified: Secondary | ICD-10-CM | POA: Diagnosis not present

## 2017-09-19 DIAGNOSIS — F411 Generalized anxiety disorder: Secondary | ICD-10-CM | POA: Diagnosis not present

## 2017-09-30 DIAGNOSIS — K5900 Constipation, unspecified: Secondary | ICD-10-CM | POA: Diagnosis not present

## 2017-09-30 DIAGNOSIS — E860 Dehydration: Secondary | ICD-10-CM | POA: Diagnosis not present

## 2017-09-30 DIAGNOSIS — N183 Chronic kidney disease, stage 3 (moderate): Secondary | ICD-10-CM | POA: Diagnosis not present

## 2017-09-30 DIAGNOSIS — N3281 Overactive bladder: Secondary | ICD-10-CM | POA: Diagnosis not present

## 2017-09-30 DIAGNOSIS — R1084 Generalized abdominal pain: Secondary | ICD-10-CM | POA: Diagnosis not present

## 2017-09-30 DIAGNOSIS — E785 Hyperlipidemia, unspecified: Secondary | ICD-10-CM | POA: Diagnosis not present

## 2017-09-30 DIAGNOSIS — E119 Type 2 diabetes mellitus without complications: Secondary | ICD-10-CM | POA: Diagnosis not present

## 2017-09-30 DIAGNOSIS — G4751 Confusional arousals: Secondary | ICD-10-CM | POA: Diagnosis not present

## 2017-09-30 DIAGNOSIS — M6281 Muscle weakness (generalized): Secondary | ICD-10-CM | POA: Diagnosis not present

## 2017-09-30 DIAGNOSIS — I10 Essential (primary) hypertension: Secondary | ICD-10-CM | POA: Diagnosis not present

## 2017-09-30 DIAGNOSIS — I509 Heart failure, unspecified: Secondary | ICD-10-CM | POA: Diagnosis not present

## 2017-09-30 DIAGNOSIS — I2510 Atherosclerotic heart disease of native coronary artery without angina pectoris: Secondary | ICD-10-CM | POA: Diagnosis not present

## 2017-10-09 DIAGNOSIS — G301 Alzheimer's disease with late onset: Secondary | ICD-10-CM | POA: Diagnosis not present

## 2017-10-09 DIAGNOSIS — F331 Major depressive disorder, recurrent, moderate: Secondary | ICD-10-CM | POA: Diagnosis not present

## 2017-10-09 DIAGNOSIS — F5105 Insomnia due to other mental disorder: Secondary | ICD-10-CM | POA: Diagnosis not present

## 2017-10-09 DIAGNOSIS — F0280 Dementia in other diseases classified elsewhere without behavioral disturbance: Secondary | ICD-10-CM | POA: Diagnosis not present

## 2017-10-09 DIAGNOSIS — F419 Anxiety disorder, unspecified: Secondary | ICD-10-CM | POA: Diagnosis not present

## 2017-10-11 DIAGNOSIS — K5900 Constipation, unspecified: Secondary | ICD-10-CM | POA: Diagnosis not present

## 2017-10-11 DIAGNOSIS — E119 Type 2 diabetes mellitus without complications: Secondary | ICD-10-CM | POA: Diagnosis not present

## 2017-10-11 DIAGNOSIS — N3281 Overactive bladder: Secondary | ICD-10-CM | POA: Diagnosis not present

## 2017-10-11 DIAGNOSIS — M150 Primary generalized (osteo)arthritis: Secondary | ICD-10-CM | POA: Diagnosis not present

## 2017-10-11 DIAGNOSIS — E785 Hyperlipidemia, unspecified: Secondary | ICD-10-CM | POA: Diagnosis not present

## 2017-10-11 DIAGNOSIS — I509 Heart failure, unspecified: Secondary | ICD-10-CM | POA: Diagnosis not present

## 2017-10-11 DIAGNOSIS — I2510 Atherosclerotic heart disease of native coronary artery without angina pectoris: Secondary | ICD-10-CM | POA: Diagnosis not present

## 2017-10-15 DIAGNOSIS — G894 Chronic pain syndrome: Secondary | ICD-10-CM | POA: Diagnosis not present

## 2017-10-15 DIAGNOSIS — M150 Primary generalized (osteo)arthritis: Secondary | ICD-10-CM | POA: Diagnosis not present

## 2017-10-23 DIAGNOSIS — M1712 Unilateral primary osteoarthritis, left knee: Secondary | ICD-10-CM | POA: Diagnosis not present

## 2017-10-23 DIAGNOSIS — N3281 Overactive bladder: Secondary | ICD-10-CM | POA: Diagnosis not present

## 2017-10-23 DIAGNOSIS — K5900 Constipation, unspecified: Secondary | ICD-10-CM | POA: Diagnosis not present

## 2017-10-23 DIAGNOSIS — F0391 Unspecified dementia with behavioral disturbance: Secondary | ICD-10-CM | POA: Diagnosis not present

## 2017-10-23 DIAGNOSIS — I10 Essential (primary) hypertension: Secondary | ICD-10-CM | POA: Diagnosis not present

## 2017-10-23 DIAGNOSIS — R41841 Cognitive communication deficit: Secondary | ICD-10-CM | POA: Diagnosis not present

## 2017-10-23 DIAGNOSIS — E119 Type 2 diabetes mellitus without complications: Secondary | ICD-10-CM | POA: Diagnosis not present

## 2017-10-23 DIAGNOSIS — E785 Hyperlipidemia, unspecified: Secondary | ICD-10-CM | POA: Diagnosis not present

## 2017-10-23 DIAGNOSIS — E860 Dehydration: Secondary | ICD-10-CM | POA: Diagnosis not present

## 2017-10-23 DIAGNOSIS — G4751 Confusional arousals: Secondary | ICD-10-CM | POA: Diagnosis not present

## 2017-10-23 DIAGNOSIS — I2510 Atherosclerotic heart disease of native coronary artery without angina pectoris: Secondary | ICD-10-CM | POA: Diagnosis not present

## 2017-10-23 DIAGNOSIS — R278 Other lack of coordination: Secondary | ICD-10-CM | POA: Diagnosis not present

## 2017-10-23 DIAGNOSIS — I509 Heart failure, unspecified: Secondary | ICD-10-CM | POA: Diagnosis not present

## 2017-10-23 DIAGNOSIS — R262 Difficulty in walking, not elsewhere classified: Secondary | ICD-10-CM | POA: Diagnosis not present

## 2017-10-23 DIAGNOSIS — M6281 Muscle weakness (generalized): Secondary | ICD-10-CM | POA: Diagnosis not present

## 2017-10-23 DIAGNOSIS — N183 Chronic kidney disease, stage 3 (moderate): Secondary | ICD-10-CM | POA: Diagnosis not present

## 2017-10-23 DIAGNOSIS — R1084 Generalized abdominal pain: Secondary | ICD-10-CM | POA: Diagnosis not present

## 2017-10-24 DIAGNOSIS — R278 Other lack of coordination: Secondary | ICD-10-CM | POA: Diagnosis not present

## 2017-10-24 DIAGNOSIS — F0391 Unspecified dementia with behavioral disturbance: Secondary | ICD-10-CM | POA: Diagnosis not present

## 2017-10-24 DIAGNOSIS — M6281 Muscle weakness (generalized): Secondary | ICD-10-CM | POA: Diagnosis not present

## 2017-10-24 DIAGNOSIS — M1712 Unilateral primary osteoarthritis, left knee: Secondary | ICD-10-CM | POA: Diagnosis not present

## 2017-10-24 DIAGNOSIS — R262 Difficulty in walking, not elsewhere classified: Secondary | ICD-10-CM | POA: Diagnosis not present

## 2017-10-24 DIAGNOSIS — D649 Anemia, unspecified: Secondary | ICD-10-CM | POA: Diagnosis not present

## 2017-10-24 DIAGNOSIS — F411 Generalized anxiety disorder: Secondary | ICD-10-CM | POA: Diagnosis not present

## 2017-10-24 DIAGNOSIS — R41841 Cognitive communication deficit: Secondary | ICD-10-CM | POA: Diagnosis not present

## 2017-10-24 DIAGNOSIS — F339 Major depressive disorder, recurrent, unspecified: Secondary | ICD-10-CM | POA: Diagnosis not present

## 2017-10-25 DIAGNOSIS — F0391 Unspecified dementia with behavioral disturbance: Secondary | ICD-10-CM | POA: Diagnosis not present

## 2017-10-25 DIAGNOSIS — R262 Difficulty in walking, not elsewhere classified: Secondary | ICD-10-CM | POA: Diagnosis not present

## 2017-10-25 DIAGNOSIS — R41841 Cognitive communication deficit: Secondary | ICD-10-CM | POA: Diagnosis not present

## 2017-10-25 DIAGNOSIS — M6281 Muscle weakness (generalized): Secondary | ICD-10-CM | POA: Diagnosis not present

## 2017-10-25 DIAGNOSIS — R278 Other lack of coordination: Secondary | ICD-10-CM | POA: Diagnosis not present

## 2017-10-25 DIAGNOSIS — M1712 Unilateral primary osteoarthritis, left knee: Secondary | ICD-10-CM | POA: Diagnosis not present

## 2017-10-28 DIAGNOSIS — F0391 Unspecified dementia with behavioral disturbance: Secondary | ICD-10-CM | POA: Diagnosis not present

## 2017-10-28 DIAGNOSIS — M6281 Muscle weakness (generalized): Secondary | ICD-10-CM | POA: Diagnosis not present

## 2017-10-28 DIAGNOSIS — R278 Other lack of coordination: Secondary | ICD-10-CM | POA: Diagnosis not present

## 2017-10-28 DIAGNOSIS — R41841 Cognitive communication deficit: Secondary | ICD-10-CM | POA: Diagnosis not present

## 2017-10-28 DIAGNOSIS — M1712 Unilateral primary osteoarthritis, left knee: Secondary | ICD-10-CM | POA: Diagnosis not present

## 2017-10-28 DIAGNOSIS — R262 Difficulty in walking, not elsewhere classified: Secondary | ICD-10-CM | POA: Diagnosis not present

## 2017-10-29 DIAGNOSIS — R262 Difficulty in walking, not elsewhere classified: Secondary | ICD-10-CM | POA: Diagnosis not present

## 2017-10-29 DIAGNOSIS — R278 Other lack of coordination: Secondary | ICD-10-CM | POA: Diagnosis not present

## 2017-10-29 DIAGNOSIS — M1712 Unilateral primary osteoarthritis, left knee: Secondary | ICD-10-CM | POA: Diagnosis not present

## 2017-10-29 DIAGNOSIS — M6281 Muscle weakness (generalized): Secondary | ICD-10-CM | POA: Diagnosis not present

## 2017-10-29 DIAGNOSIS — F0391 Unspecified dementia with behavioral disturbance: Secondary | ICD-10-CM | POA: Diagnosis not present

## 2017-10-29 DIAGNOSIS — R41841 Cognitive communication deficit: Secondary | ICD-10-CM | POA: Diagnosis not present

## 2017-10-30 DIAGNOSIS — M1712 Unilateral primary osteoarthritis, left knee: Secondary | ICD-10-CM | POA: Diagnosis not present

## 2017-10-30 DIAGNOSIS — R278 Other lack of coordination: Secondary | ICD-10-CM | POA: Diagnosis not present

## 2017-10-30 DIAGNOSIS — F0391 Unspecified dementia with behavioral disturbance: Secondary | ICD-10-CM | POA: Diagnosis not present

## 2017-10-30 DIAGNOSIS — R41841 Cognitive communication deficit: Secondary | ICD-10-CM | POA: Diagnosis not present

## 2017-10-30 DIAGNOSIS — R262 Difficulty in walking, not elsewhere classified: Secondary | ICD-10-CM | POA: Diagnosis not present

## 2017-10-30 DIAGNOSIS — M6281 Muscle weakness (generalized): Secondary | ICD-10-CM | POA: Diagnosis not present

## 2017-10-31 DIAGNOSIS — R41841 Cognitive communication deficit: Secondary | ICD-10-CM | POA: Diagnosis not present

## 2017-10-31 DIAGNOSIS — M1712 Unilateral primary osteoarthritis, left knee: Secondary | ICD-10-CM | POA: Diagnosis not present

## 2017-10-31 DIAGNOSIS — M6281 Muscle weakness (generalized): Secondary | ICD-10-CM | POA: Diagnosis not present

## 2017-10-31 DIAGNOSIS — R278 Other lack of coordination: Secondary | ICD-10-CM | POA: Diagnosis not present

## 2017-10-31 DIAGNOSIS — F0391 Unspecified dementia with behavioral disturbance: Secondary | ICD-10-CM | POA: Diagnosis not present

## 2017-10-31 DIAGNOSIS — R262 Difficulty in walking, not elsewhere classified: Secondary | ICD-10-CM | POA: Diagnosis not present

## 2017-11-01 DIAGNOSIS — R278 Other lack of coordination: Secondary | ICD-10-CM | POA: Diagnosis not present

## 2017-11-01 DIAGNOSIS — F0391 Unspecified dementia with behavioral disturbance: Secondary | ICD-10-CM | POA: Diagnosis not present

## 2017-11-01 DIAGNOSIS — M6281 Muscle weakness (generalized): Secondary | ICD-10-CM | POA: Diagnosis not present

## 2017-11-01 DIAGNOSIS — R262 Difficulty in walking, not elsewhere classified: Secondary | ICD-10-CM | POA: Diagnosis not present

## 2017-11-01 DIAGNOSIS — M1712 Unilateral primary osteoarthritis, left knee: Secondary | ICD-10-CM | POA: Diagnosis not present

## 2017-11-01 DIAGNOSIS — R41841 Cognitive communication deficit: Secondary | ICD-10-CM | POA: Diagnosis not present

## 2017-11-04 DIAGNOSIS — F0391 Unspecified dementia with behavioral disturbance: Secondary | ICD-10-CM | POA: Diagnosis not present

## 2017-11-04 DIAGNOSIS — R262 Difficulty in walking, not elsewhere classified: Secondary | ICD-10-CM | POA: Diagnosis not present

## 2017-11-04 DIAGNOSIS — M6281 Muscle weakness (generalized): Secondary | ICD-10-CM | POA: Diagnosis not present

## 2017-11-04 DIAGNOSIS — R41841 Cognitive communication deficit: Secondary | ICD-10-CM | POA: Diagnosis not present

## 2017-11-04 DIAGNOSIS — M1712 Unilateral primary osteoarthritis, left knee: Secondary | ICD-10-CM | POA: Diagnosis not present

## 2017-11-04 DIAGNOSIS — R278 Other lack of coordination: Secondary | ICD-10-CM | POA: Diagnosis not present

## 2017-11-05 DIAGNOSIS — G8929 Other chronic pain: Secondary | ICD-10-CM | POA: Diagnosis not present

## 2017-11-05 DIAGNOSIS — R278 Other lack of coordination: Secondary | ICD-10-CM | POA: Diagnosis not present

## 2017-11-05 DIAGNOSIS — F0391 Unspecified dementia with behavioral disturbance: Secondary | ICD-10-CM | POA: Diagnosis not present

## 2017-11-05 DIAGNOSIS — R41841 Cognitive communication deficit: Secondary | ICD-10-CM | POA: Diagnosis not present

## 2017-11-05 DIAGNOSIS — M6281 Muscle weakness (generalized): Secondary | ICD-10-CM | POA: Diagnosis not present

## 2017-11-05 DIAGNOSIS — M1712 Unilateral primary osteoarthritis, left knee: Secondary | ICD-10-CM | POA: Diagnosis not present

## 2017-11-05 DIAGNOSIS — R262 Difficulty in walking, not elsewhere classified: Secondary | ICD-10-CM | POA: Diagnosis not present

## 2017-11-05 DIAGNOSIS — R21 Rash and other nonspecific skin eruption: Secondary | ICD-10-CM | POA: Diagnosis not present

## 2017-11-06 DIAGNOSIS — M1712 Unilateral primary osteoarthritis, left knee: Secondary | ICD-10-CM | POA: Diagnosis not present

## 2017-11-06 DIAGNOSIS — F5105 Insomnia due to other mental disorder: Secondary | ICD-10-CM | POA: Diagnosis not present

## 2017-11-06 DIAGNOSIS — R278 Other lack of coordination: Secondary | ICD-10-CM | POA: Diagnosis not present

## 2017-11-06 DIAGNOSIS — M6281 Muscle weakness (generalized): Secondary | ICD-10-CM | POA: Diagnosis not present

## 2017-11-06 DIAGNOSIS — R262 Difficulty in walking, not elsewhere classified: Secondary | ICD-10-CM | POA: Diagnosis not present

## 2017-11-06 DIAGNOSIS — F0280 Dementia in other diseases classified elsewhere without behavioral disturbance: Secondary | ICD-10-CM | POA: Diagnosis not present

## 2017-11-06 DIAGNOSIS — G301 Alzheimer's disease with late onset: Secondary | ICD-10-CM | POA: Diagnosis not present

## 2017-11-06 DIAGNOSIS — R41841 Cognitive communication deficit: Secondary | ICD-10-CM | POA: Diagnosis not present

## 2017-11-06 DIAGNOSIS — F0391 Unspecified dementia with behavioral disturbance: Secondary | ICD-10-CM | POA: Diagnosis not present

## 2017-11-06 DIAGNOSIS — F419 Anxiety disorder, unspecified: Secondary | ICD-10-CM | POA: Diagnosis not present

## 2017-11-06 DIAGNOSIS — F331 Major depressive disorder, recurrent, moderate: Secondary | ICD-10-CM | POA: Diagnosis not present

## 2017-11-07 DIAGNOSIS — F0391 Unspecified dementia with behavioral disturbance: Secondary | ICD-10-CM | POA: Diagnosis not present

## 2017-11-07 DIAGNOSIS — M1712 Unilateral primary osteoarthritis, left knee: Secondary | ICD-10-CM | POA: Diagnosis not present

## 2017-11-07 DIAGNOSIS — F411 Generalized anxiety disorder: Secondary | ICD-10-CM | POA: Diagnosis not present

## 2017-11-07 DIAGNOSIS — R262 Difficulty in walking, not elsewhere classified: Secondary | ICD-10-CM | POA: Diagnosis not present

## 2017-11-07 DIAGNOSIS — F339 Major depressive disorder, recurrent, unspecified: Secondary | ICD-10-CM | POA: Diagnosis not present

## 2017-11-07 DIAGNOSIS — M545 Low back pain: Secondary | ICD-10-CM | POA: Diagnosis not present

## 2017-11-07 DIAGNOSIS — M6281 Muscle weakness (generalized): Secondary | ICD-10-CM | POA: Diagnosis not present

## 2017-11-07 DIAGNOSIS — B354 Tinea corporis: Secondary | ICD-10-CM | POA: Diagnosis not present

## 2017-11-07 DIAGNOSIS — R278 Other lack of coordination: Secondary | ICD-10-CM | POA: Diagnosis not present

## 2017-11-07 DIAGNOSIS — J309 Allergic rhinitis, unspecified: Secondary | ICD-10-CM | POA: Diagnosis not present

## 2017-11-07 DIAGNOSIS — R41841 Cognitive communication deficit: Secondary | ICD-10-CM | POA: Diagnosis not present

## 2017-11-08 DIAGNOSIS — M6281 Muscle weakness (generalized): Secondary | ICD-10-CM | POA: Diagnosis not present

## 2017-11-08 DIAGNOSIS — R262 Difficulty in walking, not elsewhere classified: Secondary | ICD-10-CM | POA: Diagnosis not present

## 2017-11-08 DIAGNOSIS — F0391 Unspecified dementia with behavioral disturbance: Secondary | ICD-10-CM | POA: Diagnosis not present

## 2017-11-08 DIAGNOSIS — R41841 Cognitive communication deficit: Secondary | ICD-10-CM | POA: Diagnosis not present

## 2017-11-08 DIAGNOSIS — M1712 Unilateral primary osteoarthritis, left knee: Secondary | ICD-10-CM | POA: Diagnosis not present

## 2017-11-08 DIAGNOSIS — R278 Other lack of coordination: Secondary | ICD-10-CM | POA: Diagnosis not present

## 2017-11-11 DIAGNOSIS — R278 Other lack of coordination: Secondary | ICD-10-CM | POA: Diagnosis not present

## 2017-11-11 DIAGNOSIS — M1712 Unilateral primary osteoarthritis, left knee: Secondary | ICD-10-CM | POA: Diagnosis not present

## 2017-11-11 DIAGNOSIS — M6281 Muscle weakness (generalized): Secondary | ICD-10-CM | POA: Diagnosis not present

## 2017-11-11 DIAGNOSIS — F0391 Unspecified dementia with behavioral disturbance: Secondary | ICD-10-CM | POA: Diagnosis not present

## 2017-11-11 DIAGNOSIS — R262 Difficulty in walking, not elsewhere classified: Secondary | ICD-10-CM | POA: Diagnosis not present

## 2017-11-11 DIAGNOSIS — R41841 Cognitive communication deficit: Secondary | ICD-10-CM | POA: Diagnosis not present

## 2017-11-12 DIAGNOSIS — F331 Major depressive disorder, recurrent, moderate: Secondary | ICD-10-CM | POA: Diagnosis not present

## 2017-11-12 DIAGNOSIS — R41841 Cognitive communication deficit: Secondary | ICD-10-CM | POA: Diagnosis not present

## 2017-11-12 DIAGNOSIS — R262 Difficulty in walking, not elsewhere classified: Secondary | ICD-10-CM | POA: Diagnosis not present

## 2017-11-12 DIAGNOSIS — F5105 Insomnia due to other mental disorder: Secondary | ICD-10-CM | POA: Diagnosis not present

## 2017-11-12 DIAGNOSIS — F0391 Unspecified dementia with behavioral disturbance: Secondary | ICD-10-CM | POA: Diagnosis not present

## 2017-11-12 DIAGNOSIS — M1712 Unilateral primary osteoarthritis, left knee: Secondary | ICD-10-CM | POA: Diagnosis not present

## 2017-11-12 DIAGNOSIS — K5900 Constipation, unspecified: Secondary | ICD-10-CM | POA: Diagnosis not present

## 2017-11-12 DIAGNOSIS — F419 Anxiety disorder, unspecified: Secondary | ICD-10-CM | POA: Diagnosis not present

## 2017-11-12 DIAGNOSIS — N3281 Overactive bladder: Secondary | ICD-10-CM | POA: Diagnosis not present

## 2017-11-12 DIAGNOSIS — G301 Alzheimer's disease with late onset: Secondary | ICD-10-CM | POA: Diagnosis not present

## 2017-11-12 DIAGNOSIS — I2510 Atherosclerotic heart disease of native coronary artery without angina pectoris: Secondary | ICD-10-CM | POA: Diagnosis not present

## 2017-11-12 DIAGNOSIS — E119 Type 2 diabetes mellitus without complications: Secondary | ICD-10-CM | POA: Diagnosis not present

## 2017-11-12 DIAGNOSIS — F0280 Dementia in other diseases classified elsewhere without behavioral disturbance: Secondary | ICD-10-CM | POA: Diagnosis not present

## 2017-11-12 DIAGNOSIS — E785 Hyperlipidemia, unspecified: Secondary | ICD-10-CM | POA: Diagnosis not present

## 2017-11-12 DIAGNOSIS — M6281 Muscle weakness (generalized): Secondary | ICD-10-CM | POA: Diagnosis not present

## 2017-11-12 DIAGNOSIS — R278 Other lack of coordination: Secondary | ICD-10-CM | POA: Diagnosis not present

## 2017-11-12 DIAGNOSIS — I509 Heart failure, unspecified: Secondary | ICD-10-CM | POA: Diagnosis not present

## 2017-11-12 DIAGNOSIS — G894 Chronic pain syndrome: Secondary | ICD-10-CM | POA: Diagnosis not present

## 2017-11-12 DIAGNOSIS — M150 Primary generalized (osteo)arthritis: Secondary | ICD-10-CM | POA: Diagnosis not present

## 2017-11-13 DIAGNOSIS — M6281 Muscle weakness (generalized): Secondary | ICD-10-CM | POA: Diagnosis not present

## 2017-11-13 DIAGNOSIS — R278 Other lack of coordination: Secondary | ICD-10-CM | POA: Diagnosis not present

## 2017-11-13 DIAGNOSIS — R41841 Cognitive communication deficit: Secondary | ICD-10-CM | POA: Diagnosis not present

## 2017-11-13 DIAGNOSIS — M1712 Unilateral primary osteoarthritis, left knee: Secondary | ICD-10-CM | POA: Diagnosis not present

## 2017-11-13 DIAGNOSIS — R262 Difficulty in walking, not elsewhere classified: Secondary | ICD-10-CM | POA: Diagnosis not present

## 2017-11-13 DIAGNOSIS — F0391 Unspecified dementia with behavioral disturbance: Secondary | ICD-10-CM | POA: Diagnosis not present

## 2017-11-14 DIAGNOSIS — M1712 Unilateral primary osteoarthritis, left knee: Secondary | ICD-10-CM | POA: Diagnosis not present

## 2017-11-14 DIAGNOSIS — R262 Difficulty in walking, not elsewhere classified: Secondary | ICD-10-CM | POA: Diagnosis not present

## 2017-11-14 DIAGNOSIS — R278 Other lack of coordination: Secondary | ICD-10-CM | POA: Diagnosis not present

## 2017-11-14 DIAGNOSIS — R41841 Cognitive communication deficit: Secondary | ICD-10-CM | POA: Diagnosis not present

## 2017-11-14 DIAGNOSIS — F0391 Unspecified dementia with behavioral disturbance: Secondary | ICD-10-CM | POA: Diagnosis not present

## 2017-11-14 DIAGNOSIS — M6281 Muscle weakness (generalized): Secondary | ICD-10-CM | POA: Diagnosis not present

## 2017-11-15 DIAGNOSIS — R278 Other lack of coordination: Secondary | ICD-10-CM | POA: Diagnosis not present

## 2017-11-15 DIAGNOSIS — M6281 Muscle weakness (generalized): Secondary | ICD-10-CM | POA: Diagnosis not present

## 2017-11-15 DIAGNOSIS — M1712 Unilateral primary osteoarthritis, left knee: Secondary | ICD-10-CM | POA: Diagnosis not present

## 2017-11-15 DIAGNOSIS — R41841 Cognitive communication deficit: Secondary | ICD-10-CM | POA: Diagnosis not present

## 2017-11-15 DIAGNOSIS — R262 Difficulty in walking, not elsewhere classified: Secondary | ICD-10-CM | POA: Diagnosis not present

## 2017-11-15 DIAGNOSIS — F0391 Unspecified dementia with behavioral disturbance: Secondary | ICD-10-CM | POA: Diagnosis not present

## 2017-11-18 DIAGNOSIS — R278 Other lack of coordination: Secondary | ICD-10-CM | POA: Diagnosis not present

## 2017-11-18 DIAGNOSIS — M6281 Muscle weakness (generalized): Secondary | ICD-10-CM | POA: Diagnosis not present

## 2017-11-18 DIAGNOSIS — M1712 Unilateral primary osteoarthritis, left knee: Secondary | ICD-10-CM | POA: Diagnosis not present

## 2017-11-18 DIAGNOSIS — E119 Type 2 diabetes mellitus without complications: Secondary | ICD-10-CM | POA: Diagnosis not present

## 2017-11-18 DIAGNOSIS — R262 Difficulty in walking, not elsewhere classified: Secondary | ICD-10-CM | POA: Diagnosis not present

## 2017-11-18 DIAGNOSIS — R41841 Cognitive communication deficit: Secondary | ICD-10-CM | POA: Diagnosis not present

## 2017-11-18 DIAGNOSIS — F0391 Unspecified dementia with behavioral disturbance: Secondary | ICD-10-CM | POA: Diagnosis not present

## 2017-11-19 DIAGNOSIS — M1712 Unilateral primary osteoarthritis, left knee: Secondary | ICD-10-CM | POA: Diagnosis not present

## 2017-11-19 DIAGNOSIS — R262 Difficulty in walking, not elsewhere classified: Secondary | ICD-10-CM | POA: Diagnosis not present

## 2017-11-19 DIAGNOSIS — R278 Other lack of coordination: Secondary | ICD-10-CM | POA: Diagnosis not present

## 2017-11-19 DIAGNOSIS — F0391 Unspecified dementia with behavioral disturbance: Secondary | ICD-10-CM | POA: Diagnosis not present

## 2017-11-19 DIAGNOSIS — D649 Anemia, unspecified: Secondary | ICD-10-CM | POA: Diagnosis not present

## 2017-11-19 DIAGNOSIS — M6281 Muscle weakness (generalized): Secondary | ICD-10-CM | POA: Diagnosis not present

## 2017-11-19 DIAGNOSIS — R41841 Cognitive communication deficit: Secondary | ICD-10-CM | POA: Diagnosis not present

## 2017-11-19 DIAGNOSIS — I10 Essential (primary) hypertension: Secondary | ICD-10-CM | POA: Diagnosis not present

## 2017-11-20 DIAGNOSIS — M1712 Unilateral primary osteoarthritis, left knee: Secondary | ICD-10-CM | POA: Diagnosis not present

## 2017-11-20 DIAGNOSIS — E119 Type 2 diabetes mellitus without complications: Secondary | ICD-10-CM | POA: Diagnosis not present

## 2017-11-20 DIAGNOSIS — R278 Other lack of coordination: Secondary | ICD-10-CM | POA: Diagnosis not present

## 2017-11-20 DIAGNOSIS — R262 Difficulty in walking, not elsewhere classified: Secondary | ICD-10-CM | POA: Diagnosis not present

## 2017-11-20 DIAGNOSIS — M6281 Muscle weakness (generalized): Secondary | ICD-10-CM | POA: Diagnosis not present

## 2017-11-20 DIAGNOSIS — F0391 Unspecified dementia with behavioral disturbance: Secondary | ICD-10-CM | POA: Diagnosis not present

## 2017-11-20 DIAGNOSIS — Z961 Presence of intraocular lens: Secondary | ICD-10-CM | POA: Diagnosis not present

## 2017-11-20 DIAGNOSIS — H04123 Dry eye syndrome of bilateral lacrimal glands: Secondary | ICD-10-CM | POA: Diagnosis not present

## 2017-11-20 DIAGNOSIS — R41841 Cognitive communication deficit: Secondary | ICD-10-CM | POA: Diagnosis not present

## 2017-11-26 DIAGNOSIS — I509 Heart failure, unspecified: Secondary | ICD-10-CM | POA: Diagnosis not present

## 2017-11-26 DIAGNOSIS — R1084 Generalized abdominal pain: Secondary | ICD-10-CM | POA: Diagnosis not present

## 2017-11-26 DIAGNOSIS — N183 Chronic kidney disease, stage 3 (moderate): Secondary | ICD-10-CM | POA: Diagnosis not present

## 2017-11-26 DIAGNOSIS — M6281 Muscle weakness (generalized): Secondary | ICD-10-CM | POA: Diagnosis not present

## 2017-11-26 DIAGNOSIS — K5900 Constipation, unspecified: Secondary | ICD-10-CM | POA: Diagnosis not present

## 2017-11-26 DIAGNOSIS — I10 Essential (primary) hypertension: Secondary | ICD-10-CM | POA: Diagnosis not present

## 2017-11-26 DIAGNOSIS — Z87440 Personal history of urinary (tract) infections: Secondary | ICD-10-CM | POA: Diagnosis not present

## 2017-11-26 DIAGNOSIS — G8929 Other chronic pain: Secondary | ICD-10-CM | POA: Diagnosis not present

## 2017-11-26 DIAGNOSIS — I2510 Atherosclerotic heart disease of native coronary artery without angina pectoris: Secondary | ICD-10-CM | POA: Diagnosis not present

## 2017-11-26 DIAGNOSIS — N3281 Overactive bladder: Secondary | ICD-10-CM | POA: Diagnosis not present

## 2017-11-26 DIAGNOSIS — E785 Hyperlipidemia, unspecified: Secondary | ICD-10-CM | POA: Diagnosis not present

## 2017-11-26 DIAGNOSIS — E119 Type 2 diabetes mellitus without complications: Secondary | ICD-10-CM | POA: Diagnosis not present

## 2017-11-28 DIAGNOSIS — F411 Generalized anxiety disorder: Secondary | ICD-10-CM | POA: Diagnosis not present

## 2017-11-28 DIAGNOSIS — F339 Major depressive disorder, recurrent, unspecified: Secondary | ICD-10-CM | POA: Diagnosis not present

## 2017-11-29 DIAGNOSIS — E785 Hyperlipidemia, unspecified: Secondary | ICD-10-CM | POA: Diagnosis not present

## 2017-11-29 DIAGNOSIS — I10 Essential (primary) hypertension: Secondary | ICD-10-CM | POA: Diagnosis not present

## 2017-11-29 DIAGNOSIS — I2510 Atherosclerotic heart disease of native coronary artery without angina pectoris: Secondary | ICD-10-CM | POA: Diagnosis not present

## 2017-11-29 DIAGNOSIS — N3281 Overactive bladder: Secondary | ICD-10-CM | POA: Diagnosis not present

## 2017-11-29 DIAGNOSIS — R1084 Generalized abdominal pain: Secondary | ICD-10-CM | POA: Diagnosis not present

## 2017-11-29 DIAGNOSIS — D509 Iron deficiency anemia, unspecified: Secondary | ICD-10-CM | POA: Diagnosis not present

## 2017-11-29 DIAGNOSIS — M6281 Muscle weakness (generalized): Secondary | ICD-10-CM | POA: Diagnosis not present

## 2017-11-29 DIAGNOSIS — I509 Heart failure, unspecified: Secondary | ICD-10-CM | POA: Diagnosis not present

## 2017-11-29 DIAGNOSIS — G8929 Other chronic pain: Secondary | ICD-10-CM | POA: Diagnosis not present

## 2017-11-29 DIAGNOSIS — E119 Type 2 diabetes mellitus without complications: Secondary | ICD-10-CM | POA: Diagnosis not present

## 2017-11-29 DIAGNOSIS — K5900 Constipation, unspecified: Secondary | ICD-10-CM | POA: Diagnosis not present

## 2017-11-29 DIAGNOSIS — N183 Chronic kidney disease, stage 3 (moderate): Secondary | ICD-10-CM | POA: Diagnosis not present

## 2017-12-03 DIAGNOSIS — F339 Major depressive disorder, recurrent, unspecified: Secondary | ICD-10-CM | POA: Diagnosis not present

## 2017-12-03 DIAGNOSIS — F411 Generalized anxiety disorder: Secondary | ICD-10-CM | POA: Diagnosis not present

## 2017-12-10 DIAGNOSIS — B354 Tinea corporis: Secondary | ICD-10-CM | POA: Diagnosis not present

## 2017-12-12 DIAGNOSIS — F0280 Dementia in other diseases classified elsewhere without behavioral disturbance: Secondary | ICD-10-CM | POA: Diagnosis not present

## 2017-12-12 DIAGNOSIS — F331 Major depressive disorder, recurrent, moderate: Secondary | ICD-10-CM | POA: Diagnosis not present

## 2017-12-12 DIAGNOSIS — G301 Alzheimer's disease with late onset: Secondary | ICD-10-CM | POA: Diagnosis not present

## 2017-12-12 DIAGNOSIS — F5105 Insomnia due to other mental disorder: Secondary | ICD-10-CM | POA: Diagnosis not present

## 2017-12-12 DIAGNOSIS — F419 Anxiety disorder, unspecified: Secondary | ICD-10-CM | POA: Diagnosis not present

## 2017-12-13 DIAGNOSIS — N3281 Overactive bladder: Secondary | ICD-10-CM | POA: Diagnosis not present

## 2017-12-13 DIAGNOSIS — G301 Alzheimer's disease with late onset: Secondary | ICD-10-CM | POA: Diagnosis not present

## 2017-12-13 DIAGNOSIS — M1990 Unspecified osteoarthritis, unspecified site: Secondary | ICD-10-CM | POA: Diagnosis not present

## 2017-12-13 DIAGNOSIS — F331 Major depressive disorder, recurrent, moderate: Secondary | ICD-10-CM | POA: Diagnosis not present

## 2017-12-13 DIAGNOSIS — F419 Anxiety disorder, unspecified: Secondary | ICD-10-CM | POA: Diagnosis not present

## 2017-12-13 DIAGNOSIS — G894 Chronic pain syndrome: Secondary | ICD-10-CM | POA: Diagnosis not present

## 2017-12-13 DIAGNOSIS — K5900 Constipation, unspecified: Secondary | ICD-10-CM | POA: Diagnosis not present

## 2017-12-13 DIAGNOSIS — I2510 Atherosclerotic heart disease of native coronary artery without angina pectoris: Secondary | ICD-10-CM | POA: Diagnosis not present

## 2017-12-13 DIAGNOSIS — I509 Heart failure, unspecified: Secondary | ICD-10-CM | POA: Diagnosis not present

## 2017-12-13 DIAGNOSIS — E119 Type 2 diabetes mellitus without complications: Secondary | ICD-10-CM | POA: Diagnosis not present

## 2017-12-13 DIAGNOSIS — F5105 Insomnia due to other mental disorder: Secondary | ICD-10-CM | POA: Diagnosis not present

## 2017-12-13 DIAGNOSIS — F0280 Dementia in other diseases classified elsewhere without behavioral disturbance: Secondary | ICD-10-CM | POA: Diagnosis not present

## 2017-12-13 DIAGNOSIS — E785 Hyperlipidemia, unspecified: Secondary | ICD-10-CM | POA: Diagnosis not present

## 2017-12-13 DIAGNOSIS — M150 Primary generalized (osteo)arthritis: Secondary | ICD-10-CM | POA: Diagnosis not present

## 2017-12-19 DIAGNOSIS — F411 Generalized anxiety disorder: Secondary | ICD-10-CM | POA: Diagnosis not present

## 2017-12-19 DIAGNOSIS — F339 Major depressive disorder, recurrent, unspecified: Secondary | ICD-10-CM | POA: Diagnosis not present

## 2017-12-20 DIAGNOSIS — I739 Peripheral vascular disease, unspecified: Secondary | ICD-10-CM | POA: Diagnosis not present

## 2017-12-24 DIAGNOSIS — H00019 Hordeolum externum unspecified eye, unspecified eyelid: Secondary | ICD-10-CM | POA: Diagnosis not present

## 2017-12-26 DIAGNOSIS — F411 Generalized anxiety disorder: Secondary | ICD-10-CM | POA: Diagnosis not present

## 2017-12-26 DIAGNOSIS — F339 Major depressive disorder, recurrent, unspecified: Secondary | ICD-10-CM | POA: Diagnosis not present

## 2017-12-31 DIAGNOSIS — D509 Iron deficiency anemia, unspecified: Secondary | ICD-10-CM | POA: Diagnosis not present

## 2017-12-31 DIAGNOSIS — I509 Heart failure, unspecified: Secondary | ICD-10-CM | POA: Diagnosis not present

## 2017-12-31 DIAGNOSIS — M6281 Muscle weakness (generalized): Secondary | ICD-10-CM | POA: Diagnosis not present

## 2017-12-31 DIAGNOSIS — E119 Type 2 diabetes mellitus without complications: Secondary | ICD-10-CM | POA: Diagnosis not present

## 2017-12-31 DIAGNOSIS — K5900 Constipation, unspecified: Secondary | ICD-10-CM | POA: Diagnosis not present

## 2017-12-31 DIAGNOSIS — G8929 Other chronic pain: Secondary | ICD-10-CM | POA: Diagnosis not present

## 2017-12-31 DIAGNOSIS — Z87440 Personal history of urinary (tract) infections: Secondary | ICD-10-CM | POA: Diagnosis not present

## 2017-12-31 DIAGNOSIS — I10 Essential (primary) hypertension: Secondary | ICD-10-CM | POA: Diagnosis not present

## 2017-12-31 DIAGNOSIS — N183 Chronic kidney disease, stage 3 (moderate): Secondary | ICD-10-CM | POA: Diagnosis not present

## 2017-12-31 DIAGNOSIS — E785 Hyperlipidemia, unspecified: Secondary | ICD-10-CM | POA: Diagnosis not present

## 2017-12-31 DIAGNOSIS — I2510 Atherosclerotic heart disease of native coronary artery without angina pectoris: Secondary | ICD-10-CM | POA: Diagnosis not present

## 2017-12-31 DIAGNOSIS — N3281 Overactive bladder: Secondary | ICD-10-CM | POA: Diagnosis not present

## 2018-01-06 DIAGNOSIS — H1031 Unspecified acute conjunctivitis, right eye: Secondary | ICD-10-CM | POA: Diagnosis not present

## 2018-01-06 DIAGNOSIS — L03213 Periorbital cellulitis: Secondary | ICD-10-CM | POA: Diagnosis not present

## 2018-01-08 DIAGNOSIS — F5105 Insomnia due to other mental disorder: Secondary | ICD-10-CM | POA: Diagnosis not present

## 2018-01-08 DIAGNOSIS — L03213 Periorbital cellulitis: Secondary | ICD-10-CM | POA: Diagnosis not present

## 2018-01-08 DIAGNOSIS — F0280 Dementia in other diseases classified elsewhere without behavioral disturbance: Secondary | ICD-10-CM | POA: Diagnosis not present

## 2018-01-08 DIAGNOSIS — H1031 Unspecified acute conjunctivitis, right eye: Secondary | ICD-10-CM | POA: Diagnosis not present

## 2018-01-08 DIAGNOSIS — F419 Anxiety disorder, unspecified: Secondary | ICD-10-CM | POA: Diagnosis not present

## 2018-01-08 DIAGNOSIS — G301 Alzheimer's disease with late onset: Secondary | ICD-10-CM | POA: Diagnosis not present

## 2018-01-08 DIAGNOSIS — F331 Major depressive disorder, recurrent, moderate: Secondary | ICD-10-CM | POA: Diagnosis not present

## 2018-01-09 DIAGNOSIS — F339 Major depressive disorder, recurrent, unspecified: Secondary | ICD-10-CM | POA: Diagnosis not present

## 2018-01-09 DIAGNOSIS — F411 Generalized anxiety disorder: Secondary | ICD-10-CM | POA: Diagnosis not present

## 2018-01-15 DIAGNOSIS — G894 Chronic pain syndrome: Secondary | ICD-10-CM | POA: Diagnosis not present

## 2018-01-15 DIAGNOSIS — M150 Primary generalized (osteo)arthritis: Secondary | ICD-10-CM | POA: Diagnosis not present

## 2018-01-15 DIAGNOSIS — F419 Anxiety disorder, unspecified: Secondary | ICD-10-CM | POA: Diagnosis not present

## 2018-01-15 DIAGNOSIS — N3281 Overactive bladder: Secondary | ICD-10-CM | POA: Diagnosis not present

## 2018-01-15 DIAGNOSIS — E785 Hyperlipidemia, unspecified: Secondary | ICD-10-CM | POA: Diagnosis not present

## 2018-01-15 DIAGNOSIS — E119 Type 2 diabetes mellitus without complications: Secondary | ICD-10-CM | POA: Diagnosis not present

## 2018-01-15 DIAGNOSIS — F5105 Insomnia due to other mental disorder: Secondary | ICD-10-CM | POA: Diagnosis not present

## 2018-01-15 DIAGNOSIS — I2510 Atherosclerotic heart disease of native coronary artery without angina pectoris: Secondary | ICD-10-CM | POA: Diagnosis not present

## 2018-01-15 DIAGNOSIS — I509 Heart failure, unspecified: Secondary | ICD-10-CM | POA: Diagnosis not present

## 2018-01-15 DIAGNOSIS — G301 Alzheimer's disease with late onset: Secondary | ICD-10-CM | POA: Diagnosis not present

## 2018-01-15 DIAGNOSIS — F331 Major depressive disorder, recurrent, moderate: Secondary | ICD-10-CM | POA: Diagnosis not present

## 2018-01-15 DIAGNOSIS — F0280 Dementia in other diseases classified elsewhere without behavioral disturbance: Secondary | ICD-10-CM | POA: Diagnosis not present

## 2018-01-15 DIAGNOSIS — K5900 Constipation, unspecified: Secondary | ICD-10-CM | POA: Diagnosis not present

## 2018-01-15 DIAGNOSIS — M1990 Unspecified osteoarthritis, unspecified site: Secondary | ICD-10-CM | POA: Diagnosis not present

## 2018-01-16 DIAGNOSIS — F411 Generalized anxiety disorder: Secondary | ICD-10-CM | POA: Diagnosis not present

## 2018-01-16 DIAGNOSIS — F339 Major depressive disorder, recurrent, unspecified: Secondary | ICD-10-CM | POA: Diagnosis not present

## 2018-01-30 DIAGNOSIS — F411 Generalized anxiety disorder: Secondary | ICD-10-CM | POA: Diagnosis not present

## 2018-01-30 DIAGNOSIS — F339 Major depressive disorder, recurrent, unspecified: Secondary | ICD-10-CM | POA: Diagnosis not present

## 2018-02-04 DIAGNOSIS — E785 Hyperlipidemia, unspecified: Secondary | ICD-10-CM | POA: Diagnosis not present

## 2018-02-04 DIAGNOSIS — K5900 Constipation, unspecified: Secondary | ICD-10-CM | POA: Diagnosis not present

## 2018-02-04 DIAGNOSIS — N3281 Overactive bladder: Secondary | ICD-10-CM | POA: Diagnosis not present

## 2018-02-04 DIAGNOSIS — N183 Chronic kidney disease, stage 3 (moderate): Secondary | ICD-10-CM | POA: Diagnosis not present

## 2018-02-04 DIAGNOSIS — D509 Iron deficiency anemia, unspecified: Secondary | ICD-10-CM | POA: Diagnosis not present

## 2018-02-04 DIAGNOSIS — I2510 Atherosclerotic heart disease of native coronary artery without angina pectoris: Secondary | ICD-10-CM | POA: Diagnosis not present

## 2018-02-04 DIAGNOSIS — I10 Essential (primary) hypertension: Secondary | ICD-10-CM | POA: Diagnosis not present

## 2018-02-04 DIAGNOSIS — I509 Heart failure, unspecified: Secondary | ICD-10-CM | POA: Diagnosis not present

## 2018-02-04 DIAGNOSIS — M6281 Muscle weakness (generalized): Secondary | ICD-10-CM | POA: Diagnosis not present

## 2018-02-04 DIAGNOSIS — E119 Type 2 diabetes mellitus without complications: Secondary | ICD-10-CM | POA: Diagnosis not present

## 2018-02-04 DIAGNOSIS — R21 Rash and other nonspecific skin eruption: Secondary | ICD-10-CM | POA: Diagnosis not present

## 2018-02-04 DIAGNOSIS — G8929 Other chronic pain: Secondary | ICD-10-CM | POA: Diagnosis not present

## 2018-02-05 DIAGNOSIS — F331 Major depressive disorder, recurrent, moderate: Secondary | ICD-10-CM | POA: Diagnosis not present

## 2018-02-05 DIAGNOSIS — F419 Anxiety disorder, unspecified: Secondary | ICD-10-CM | POA: Diagnosis not present

## 2018-02-05 DIAGNOSIS — F5105 Insomnia due to other mental disorder: Secondary | ICD-10-CM | POA: Diagnosis not present

## 2018-02-05 DIAGNOSIS — G301 Alzheimer's disease with late onset: Secondary | ICD-10-CM | POA: Diagnosis not present

## 2018-02-05 DIAGNOSIS — F0280 Dementia in other diseases classified elsewhere without behavioral disturbance: Secondary | ICD-10-CM | POA: Diagnosis not present

## 2018-02-06 DIAGNOSIS — F411 Generalized anxiety disorder: Secondary | ICD-10-CM | POA: Diagnosis not present

## 2018-02-06 DIAGNOSIS — F339 Major depressive disorder, recurrent, unspecified: Secondary | ICD-10-CM | POA: Diagnosis not present

## 2018-02-12 DIAGNOSIS — F5105 Insomnia due to other mental disorder: Secondary | ICD-10-CM | POA: Diagnosis not present

## 2018-02-12 DIAGNOSIS — K5900 Constipation, unspecified: Secondary | ICD-10-CM | POA: Diagnosis not present

## 2018-02-12 DIAGNOSIS — M150 Primary generalized (osteo)arthritis: Secondary | ICD-10-CM | POA: Diagnosis not present

## 2018-02-12 DIAGNOSIS — I2510 Atherosclerotic heart disease of native coronary artery without angina pectoris: Secondary | ICD-10-CM | POA: Diagnosis not present

## 2018-02-12 DIAGNOSIS — N3281 Overactive bladder: Secondary | ICD-10-CM | POA: Diagnosis not present

## 2018-02-12 DIAGNOSIS — F331 Major depressive disorder, recurrent, moderate: Secondary | ICD-10-CM | POA: Diagnosis not present

## 2018-02-12 DIAGNOSIS — G301 Alzheimer's disease with late onset: Secondary | ICD-10-CM | POA: Diagnosis not present

## 2018-02-12 DIAGNOSIS — F419 Anxiety disorder, unspecified: Secondary | ICD-10-CM | POA: Diagnosis not present

## 2018-02-12 DIAGNOSIS — E785 Hyperlipidemia, unspecified: Secondary | ICD-10-CM | POA: Diagnosis not present

## 2018-02-12 DIAGNOSIS — I509 Heart failure, unspecified: Secondary | ICD-10-CM | POA: Diagnosis not present

## 2018-02-12 DIAGNOSIS — E119 Type 2 diabetes mellitus without complications: Secondary | ICD-10-CM | POA: Diagnosis not present

## 2018-02-12 DIAGNOSIS — F0280 Dementia in other diseases classified elsewhere without behavioral disturbance: Secondary | ICD-10-CM | POA: Diagnosis not present

## 2018-02-18 DIAGNOSIS — M1990 Unspecified osteoarthritis, unspecified site: Secondary | ICD-10-CM | POA: Diagnosis not present

## 2018-02-18 DIAGNOSIS — G894 Chronic pain syndrome: Secondary | ICD-10-CM | POA: Diagnosis not present

## 2018-02-18 DIAGNOSIS — L02828 Furuncle of other sites: Secondary | ICD-10-CM | POA: Diagnosis not present

## 2018-02-26 DIAGNOSIS — L603 Nail dystrophy: Secondary | ICD-10-CM | POA: Diagnosis not present

## 2018-02-26 DIAGNOSIS — F0280 Dementia in other diseases classified elsewhere without behavioral disturbance: Secondary | ICD-10-CM | POA: Diagnosis not present

## 2018-02-26 DIAGNOSIS — Q845 Enlarged and hypertrophic nails: Secondary | ICD-10-CM | POA: Diagnosis not present

## 2018-02-26 DIAGNOSIS — G301 Alzheimer's disease with late onset: Secondary | ICD-10-CM | POA: Diagnosis not present

## 2018-02-26 DIAGNOSIS — F331 Major depressive disorder, recurrent, moderate: Secondary | ICD-10-CM | POA: Diagnosis not present

## 2018-02-26 DIAGNOSIS — I739 Peripheral vascular disease, unspecified: Secondary | ICD-10-CM | POA: Diagnosis not present

## 2018-02-26 DIAGNOSIS — F419 Anxiety disorder, unspecified: Secondary | ICD-10-CM | POA: Diagnosis not present

## 2018-02-26 DIAGNOSIS — F5105 Insomnia due to other mental disorder: Secondary | ICD-10-CM | POA: Diagnosis not present

## 2018-02-27 DIAGNOSIS — F411 Generalized anxiety disorder: Secondary | ICD-10-CM | POA: Diagnosis not present

## 2018-02-27 DIAGNOSIS — F339 Major depressive disorder, recurrent, unspecified: Secondary | ICD-10-CM | POA: Diagnosis not present

## 2018-03-04 DIAGNOSIS — R319 Hematuria, unspecified: Secondary | ICD-10-CM | POA: Diagnosis not present

## 2018-03-04 DIAGNOSIS — N390 Urinary tract infection, site not specified: Secondary | ICD-10-CM | POA: Diagnosis not present

## 2018-03-04 DIAGNOSIS — Z79899 Other long term (current) drug therapy: Secondary | ICD-10-CM | POA: Diagnosis not present

## 2018-03-06 DIAGNOSIS — F339 Major depressive disorder, recurrent, unspecified: Secondary | ICD-10-CM | POA: Diagnosis not present

## 2018-03-06 DIAGNOSIS — F411 Generalized anxiety disorder: Secondary | ICD-10-CM | POA: Diagnosis not present

## 2018-03-07 DIAGNOSIS — N390 Urinary tract infection, site not specified: Secondary | ICD-10-CM | POA: Diagnosis not present

## 2018-03-12 DIAGNOSIS — R21 Rash and other nonspecific skin eruption: Secondary | ICD-10-CM | POA: Diagnosis not present

## 2018-03-12 DIAGNOSIS — D509 Iron deficiency anemia, unspecified: Secondary | ICD-10-CM | POA: Diagnosis not present

## 2018-03-12 DIAGNOSIS — I10 Essential (primary) hypertension: Secondary | ICD-10-CM | POA: Diagnosis not present

## 2018-03-12 DIAGNOSIS — M6281 Muscle weakness (generalized): Secondary | ICD-10-CM | POA: Diagnosis not present

## 2018-03-12 DIAGNOSIS — I509 Heart failure, unspecified: Secondary | ICD-10-CM | POA: Diagnosis not present

## 2018-03-12 DIAGNOSIS — N3281 Overactive bladder: Secondary | ICD-10-CM | POA: Diagnosis not present

## 2018-03-12 DIAGNOSIS — I2510 Atherosclerotic heart disease of native coronary artery without angina pectoris: Secondary | ICD-10-CM | POA: Diagnosis not present

## 2018-03-12 DIAGNOSIS — N390 Urinary tract infection, site not specified: Secondary | ICD-10-CM | POA: Diagnosis not present

## 2018-03-12 DIAGNOSIS — E119 Type 2 diabetes mellitus without complications: Secondary | ICD-10-CM | POA: Diagnosis not present

## 2018-03-12 DIAGNOSIS — N183 Chronic kidney disease, stage 3 (moderate): Secondary | ICD-10-CM | POA: Diagnosis not present

## 2018-03-12 DIAGNOSIS — E785 Hyperlipidemia, unspecified: Secondary | ICD-10-CM | POA: Diagnosis not present

## 2018-03-12 DIAGNOSIS — K5900 Constipation, unspecified: Secondary | ICD-10-CM | POA: Diagnosis not present

## 2018-03-13 DIAGNOSIS — F411 Generalized anxiety disorder: Secondary | ICD-10-CM | POA: Diagnosis not present

## 2018-03-13 DIAGNOSIS — F339 Major depressive disorder, recurrent, unspecified: Secondary | ICD-10-CM | POA: Diagnosis not present

## 2018-03-21 DIAGNOSIS — M1990 Unspecified osteoarthritis, unspecified site: Secondary | ICD-10-CM | POA: Diagnosis not present

## 2018-03-21 DIAGNOSIS — R21 Rash and other nonspecific skin eruption: Secondary | ICD-10-CM | POA: Diagnosis not present

## 2018-03-21 DIAGNOSIS — G894 Chronic pain syndrome: Secondary | ICD-10-CM | POA: Diagnosis not present

## 2018-03-27 DIAGNOSIS — F339 Major depressive disorder, recurrent, unspecified: Secondary | ICD-10-CM | POA: Diagnosis not present

## 2018-03-27 DIAGNOSIS — F411 Generalized anxiety disorder: Secondary | ICD-10-CM | POA: Diagnosis not present

## 2018-03-31 DIAGNOSIS — I10 Essential (primary) hypertension: Secondary | ICD-10-CM | POA: Diagnosis not present

## 2018-03-31 DIAGNOSIS — D649 Anemia, unspecified: Secondary | ICD-10-CM | POA: Diagnosis not present

## 2018-03-31 DIAGNOSIS — E119 Type 2 diabetes mellitus without complications: Secondary | ICD-10-CM | POA: Diagnosis not present

## 2018-04-01 DIAGNOSIS — R05 Cough: Secondary | ICD-10-CM | POA: Diagnosis not present

## 2018-04-01 DIAGNOSIS — J069 Acute upper respiratory infection, unspecified: Secondary | ICD-10-CM | POA: Diagnosis not present

## 2018-04-01 DIAGNOSIS — D72829 Elevated white blood cell count, unspecified: Secondary | ICD-10-CM | POA: Diagnosis not present

## 2018-04-01 DIAGNOSIS — R509 Fever, unspecified: Secondary | ICD-10-CM | POA: Diagnosis not present

## 2018-04-07 DIAGNOSIS — N3281 Overactive bladder: Secondary | ICD-10-CM | POA: Diagnosis not present

## 2018-04-07 DIAGNOSIS — D509 Iron deficiency anemia, unspecified: Secondary | ICD-10-CM | POA: Diagnosis not present

## 2018-04-07 DIAGNOSIS — N183 Chronic kidney disease, stage 3 (moderate): Secondary | ICD-10-CM | POA: Diagnosis not present

## 2018-04-07 DIAGNOSIS — N390 Urinary tract infection, site not specified: Secondary | ICD-10-CM | POA: Diagnosis not present

## 2018-04-07 DIAGNOSIS — I2510 Atherosclerotic heart disease of native coronary artery without angina pectoris: Secondary | ICD-10-CM | POA: Diagnosis not present

## 2018-04-07 DIAGNOSIS — K5900 Constipation, unspecified: Secondary | ICD-10-CM | POA: Diagnosis not present

## 2018-04-07 DIAGNOSIS — E119 Type 2 diabetes mellitus without complications: Secondary | ICD-10-CM | POA: Diagnosis not present

## 2018-04-07 DIAGNOSIS — E785 Hyperlipidemia, unspecified: Secondary | ICD-10-CM | POA: Diagnosis not present

## 2018-04-07 DIAGNOSIS — M6281 Muscle weakness (generalized): Secondary | ICD-10-CM | POA: Diagnosis not present

## 2018-04-07 DIAGNOSIS — I10 Essential (primary) hypertension: Secondary | ICD-10-CM | POA: Diagnosis not present

## 2018-04-07 DIAGNOSIS — F05 Delirium due to known physiological condition: Secondary | ICD-10-CM | POA: Diagnosis not present

## 2018-04-07 DIAGNOSIS — I509 Heart failure, unspecified: Secondary | ICD-10-CM | POA: Diagnosis not present

## 2018-04-08 DIAGNOSIS — R229 Localized swelling, mass and lump, unspecified: Secondary | ICD-10-CM | POA: Diagnosis not present

## 2018-04-09 DIAGNOSIS — G301 Alzheimer's disease with late onset: Secondary | ICD-10-CM | POA: Diagnosis not present

## 2018-04-09 DIAGNOSIS — F419 Anxiety disorder, unspecified: Secondary | ICD-10-CM | POA: Diagnosis not present

## 2018-04-09 DIAGNOSIS — F5105 Insomnia due to other mental disorder: Secondary | ICD-10-CM | POA: Diagnosis not present

## 2018-04-09 DIAGNOSIS — F0280 Dementia in other diseases classified elsewhere without behavioral disturbance: Secondary | ICD-10-CM | POA: Diagnosis not present

## 2018-04-09 DIAGNOSIS — F331 Major depressive disorder, recurrent, moderate: Secondary | ICD-10-CM | POA: Diagnosis not present

## 2018-04-10 DIAGNOSIS — F411 Generalized anxiety disorder: Secondary | ICD-10-CM | POA: Diagnosis not present

## 2018-04-10 DIAGNOSIS — F339 Major depressive disorder, recurrent, unspecified: Secondary | ICD-10-CM | POA: Diagnosis not present

## 2018-04-10 DIAGNOSIS — N23 Unspecified renal colic: Secondary | ICD-10-CM | POA: Diagnosis not present

## 2018-04-17 DIAGNOSIS — E785 Hyperlipidemia, unspecified: Secondary | ICD-10-CM | POA: Diagnosis not present

## 2018-04-17 DIAGNOSIS — I509 Heart failure, unspecified: Secondary | ICD-10-CM | POA: Diagnosis not present

## 2018-04-17 DIAGNOSIS — R21 Rash and other nonspecific skin eruption: Secondary | ICD-10-CM | POA: Diagnosis not present

## 2018-04-17 DIAGNOSIS — N183 Chronic kidney disease, stage 3 (moderate): Secondary | ICD-10-CM | POA: Diagnosis not present

## 2018-04-17 DIAGNOSIS — D509 Iron deficiency anemia, unspecified: Secondary | ICD-10-CM | POA: Diagnosis not present

## 2018-04-17 DIAGNOSIS — N390 Urinary tract infection, site not specified: Secondary | ICD-10-CM | POA: Diagnosis not present

## 2018-04-17 DIAGNOSIS — I10 Essential (primary) hypertension: Secondary | ICD-10-CM | POA: Diagnosis not present

## 2018-04-17 DIAGNOSIS — E119 Type 2 diabetes mellitus without complications: Secondary | ICD-10-CM | POA: Diagnosis not present

## 2018-04-17 DIAGNOSIS — N3281 Overactive bladder: Secondary | ICD-10-CM | POA: Diagnosis not present

## 2018-04-17 DIAGNOSIS — M6281 Muscle weakness (generalized): Secondary | ICD-10-CM | POA: Diagnosis not present

## 2018-04-17 DIAGNOSIS — K5900 Constipation, unspecified: Secondary | ICD-10-CM | POA: Diagnosis not present

## 2018-04-17 DIAGNOSIS — F05 Delirium due to known physiological condition: Secondary | ICD-10-CM | POA: Diagnosis not present

## 2018-04-24 DIAGNOSIS — F411 Generalized anxiety disorder: Secondary | ICD-10-CM | POA: Diagnosis not present

## 2018-04-24 DIAGNOSIS — F339 Major depressive disorder, recurrent, unspecified: Secondary | ICD-10-CM | POA: Diagnosis not present

## 2018-04-30 DIAGNOSIS — L603 Nail dystrophy: Secondary | ICD-10-CM | POA: Diagnosis not present

## 2018-04-30 DIAGNOSIS — Q845 Enlarged and hypertrophic nails: Secondary | ICD-10-CM | POA: Diagnosis not present

## 2018-04-30 DIAGNOSIS — G894 Chronic pain syndrome: Secondary | ICD-10-CM | POA: Diagnosis not present

## 2018-04-30 DIAGNOSIS — M1990 Unspecified osteoarthritis, unspecified site: Secondary | ICD-10-CM | POA: Diagnosis not present

## 2018-04-30 DIAGNOSIS — I739 Peripheral vascular disease, unspecified: Secondary | ICD-10-CM | POA: Diagnosis not present

## 2018-05-01 DIAGNOSIS — F411 Generalized anxiety disorder: Secondary | ICD-10-CM | POA: Diagnosis not present

## 2018-05-01 DIAGNOSIS — F339 Major depressive disorder, recurrent, unspecified: Secondary | ICD-10-CM | POA: Diagnosis not present

## 2018-05-07 DIAGNOSIS — F5105 Insomnia due to other mental disorder: Secondary | ICD-10-CM | POA: Diagnosis not present

## 2018-05-07 DIAGNOSIS — F331 Major depressive disorder, recurrent, moderate: Secondary | ICD-10-CM | POA: Diagnosis not present

## 2018-05-07 DIAGNOSIS — F0280 Dementia in other diseases classified elsewhere without behavioral disturbance: Secondary | ICD-10-CM | POA: Diagnosis not present

## 2018-05-07 DIAGNOSIS — G301 Alzheimer's disease with late onset: Secondary | ICD-10-CM | POA: Diagnosis not present

## 2018-05-07 DIAGNOSIS — F419 Anxiety disorder, unspecified: Secondary | ICD-10-CM | POA: Diagnosis not present

## 2018-05-15 DIAGNOSIS — F339 Major depressive disorder, recurrent, unspecified: Secondary | ICD-10-CM | POA: Diagnosis not present

## 2018-05-15 DIAGNOSIS — F411 Generalized anxiety disorder: Secondary | ICD-10-CM | POA: Diagnosis not present

## 2018-05-19 DIAGNOSIS — E119 Type 2 diabetes mellitus without complications: Secondary | ICD-10-CM | POA: Diagnosis not present

## 2018-05-19 DIAGNOSIS — I509 Heart failure, unspecified: Secondary | ICD-10-CM | POA: Diagnosis not present

## 2018-05-22 DIAGNOSIS — F339 Major depressive disorder, recurrent, unspecified: Secondary | ICD-10-CM | POA: Diagnosis not present

## 2018-05-22 DIAGNOSIS — F411 Generalized anxiety disorder: Secondary | ICD-10-CM | POA: Diagnosis not present

## 2018-05-28 DIAGNOSIS — F419 Anxiety disorder, unspecified: Secondary | ICD-10-CM | POA: Diagnosis not present

## 2018-05-28 DIAGNOSIS — G301 Alzheimer's disease with late onset: Secondary | ICD-10-CM | POA: Diagnosis not present

## 2018-05-28 DIAGNOSIS — G894 Chronic pain syndrome: Secondary | ICD-10-CM | POA: Diagnosis not present

## 2018-05-28 DIAGNOSIS — F5105 Insomnia due to other mental disorder: Secondary | ICD-10-CM | POA: Diagnosis not present

## 2018-05-28 DIAGNOSIS — F331 Major depressive disorder, recurrent, moderate: Secondary | ICD-10-CM | POA: Diagnosis not present

## 2018-05-28 DIAGNOSIS — M1990 Unspecified osteoarthritis, unspecified site: Secondary | ICD-10-CM | POA: Diagnosis not present

## 2018-05-28 DIAGNOSIS — F0280 Dementia in other diseases classified elsewhere without behavioral disturbance: Secondary | ICD-10-CM | POA: Diagnosis not present

## 2018-06-12 DIAGNOSIS — F411 Generalized anxiety disorder: Secondary | ICD-10-CM | POA: Diagnosis not present

## 2018-06-12 DIAGNOSIS — E119 Type 2 diabetes mellitus without complications: Secondary | ICD-10-CM | POA: Diagnosis not present

## 2018-06-12 DIAGNOSIS — F339 Major depressive disorder, recurrent, unspecified: Secondary | ICD-10-CM | POA: Diagnosis not present

## 2018-06-23 DIAGNOSIS — N390 Urinary tract infection, site not specified: Secondary | ICD-10-CM | POA: Diagnosis not present

## 2018-06-23 DIAGNOSIS — D509 Iron deficiency anemia, unspecified: Secondary | ICD-10-CM | POA: Diagnosis not present

## 2018-06-23 DIAGNOSIS — M6281 Muscle weakness (generalized): Secondary | ICD-10-CM | POA: Diagnosis not present

## 2018-06-23 DIAGNOSIS — E785 Hyperlipidemia, unspecified: Secondary | ICD-10-CM | POA: Diagnosis not present

## 2018-06-23 DIAGNOSIS — R21 Rash and other nonspecific skin eruption: Secondary | ICD-10-CM | POA: Diagnosis not present

## 2018-06-23 DIAGNOSIS — I509 Heart failure, unspecified: Secondary | ICD-10-CM | POA: Diagnosis not present

## 2018-06-23 DIAGNOSIS — G8929 Other chronic pain: Secondary | ICD-10-CM | POA: Diagnosis not present

## 2018-06-23 DIAGNOSIS — I10 Essential (primary) hypertension: Secondary | ICD-10-CM | POA: Diagnosis not present

## 2018-06-23 DIAGNOSIS — E119 Type 2 diabetes mellitus without complications: Secondary | ICD-10-CM | POA: Diagnosis not present

## 2018-06-23 DIAGNOSIS — H6123 Impacted cerumen, bilateral: Secondary | ICD-10-CM | POA: Diagnosis not present

## 2018-06-23 DIAGNOSIS — F05 Delirium due to known physiological condition: Secondary | ICD-10-CM | POA: Diagnosis not present

## 2018-06-23 DIAGNOSIS — N183 Chronic kidney disease, stage 3 (moderate): Secondary | ICD-10-CM | POA: Diagnosis not present

## 2018-06-26 DIAGNOSIS — G894 Chronic pain syndrome: Secondary | ICD-10-CM | POA: Diagnosis not present

## 2018-06-26 DIAGNOSIS — F419 Anxiety disorder, unspecified: Secondary | ICD-10-CM | POA: Diagnosis not present

## 2018-07-02 DIAGNOSIS — I739 Peripheral vascular disease, unspecified: Secondary | ICD-10-CM | POA: Diagnosis not present

## 2018-07-02 DIAGNOSIS — L603 Nail dystrophy: Secondary | ICD-10-CM | POA: Diagnosis not present

## 2018-07-02 DIAGNOSIS — Q845 Enlarged and hypertrophic nails: Secondary | ICD-10-CM | POA: Diagnosis not present

## 2018-07-09 DIAGNOSIS — F064 Anxiety disorder due to known physiological condition: Secondary | ICD-10-CM | POA: Diagnosis not present

## 2018-07-09 DIAGNOSIS — F331 Major depressive disorder, recurrent, moderate: Secondary | ICD-10-CM | POA: Diagnosis not present

## 2018-07-09 DIAGNOSIS — G4701 Insomnia due to medical condition: Secondary | ICD-10-CM | POA: Diagnosis not present

## 2018-07-09 DIAGNOSIS — F0280 Dementia in other diseases classified elsewhere without behavioral disturbance: Secondary | ICD-10-CM | POA: Diagnosis not present

## 2018-07-09 DIAGNOSIS — G301 Alzheimer's disease with late onset: Secondary | ICD-10-CM | POA: Diagnosis not present

## 2018-07-21 DIAGNOSIS — M1990 Unspecified osteoarthritis, unspecified site: Secondary | ICD-10-CM | POA: Diagnosis not present

## 2018-07-21 DIAGNOSIS — G894 Chronic pain syndrome: Secondary | ICD-10-CM | POA: Diagnosis not present

## 2018-07-21 DIAGNOSIS — M25561 Pain in right knee: Secondary | ICD-10-CM | POA: Diagnosis not present

## 2019-07-28 ENCOUNTER — Other Ambulatory Visit: Payer: Self-pay

## 2019-07-28 ENCOUNTER — Encounter (HOSPITAL_COMMUNITY): Payer: Self-pay | Admitting: Emergency Medicine

## 2019-07-28 ENCOUNTER — Emergency Department (HOSPITAL_COMMUNITY)
Admission: EM | Admit: 2019-07-28 | Discharge: 2019-07-29 | Disposition: A | Discharge status: Home / Self Care | Payer: Medicare Other | Attending: Emergency Medicine | Admitting: Emergency Medicine

## 2019-07-28 DIAGNOSIS — Z7901 Long term (current) use of anticoagulants: Secondary | ICD-10-CM | POA: Diagnosis not present

## 2019-07-28 DIAGNOSIS — E039 Hypothyroidism, unspecified: Secondary | ICD-10-CM | POA: Insufficient documentation

## 2019-07-28 DIAGNOSIS — I130 Hypertensive heart and chronic kidney disease with heart failure and stage 1 through stage 4 chronic kidney disease, or unspecified chronic kidney disease: Secondary | ICD-10-CM | POA: Insufficient documentation

## 2019-07-28 DIAGNOSIS — I509 Heart failure, unspecified: Secondary | ICD-10-CM | POA: Insufficient documentation

## 2019-07-28 DIAGNOSIS — E1122 Type 2 diabetes mellitus with diabetic chronic kidney disease: Secondary | ICD-10-CM | POA: Insufficient documentation

## 2019-07-28 DIAGNOSIS — Z79899 Other long term (current) drug therapy: Secondary | ICD-10-CM | POA: Insufficient documentation

## 2019-07-28 DIAGNOSIS — N183 Chronic kidney disease, stage 3 unspecified: Secondary | ICD-10-CM | POA: Diagnosis not present

## 2019-07-28 DIAGNOSIS — Z794 Long term (current) use of insulin: Secondary | ICD-10-CM | POA: Insufficient documentation

## 2019-07-28 DIAGNOSIS — R040 Epistaxis: Secondary | ICD-10-CM | POA: Diagnosis present

## 2019-07-28 DIAGNOSIS — Z87891 Personal history of nicotine dependence: Secondary | ICD-10-CM | POA: Insufficient documentation

## 2019-07-28 DIAGNOSIS — I2510 Atherosclerotic heart disease of native coronary artery without angina pectoris: Secondary | ICD-10-CM | POA: Insufficient documentation

## 2019-07-28 DIAGNOSIS — Z8673 Personal history of transient ischemic attack (TIA), and cerebral infarction without residual deficits: Secondary | ICD-10-CM | POA: Insufficient documentation

## 2019-07-28 MED ORDER — ACETAMINOPHEN 325 MG PO TABS
650.0000 mg | ORAL_TABLET | Freq: Once | ORAL | Status: DC
  Filled 2019-07-28: qty 2

## 2019-07-28 NOTE — ED Provider Notes (Addendum)
MSE was initiated and I personally evaluated the patient and placed orders (if any) at  4:58 PM on July 28, 2019.  The patient appears stable so that the remainder of the MSE may be completed by another provider.  Abigail Brooks presents today for a nose bleed that started at 1pm today.  She was given afrin and nose bleed stopped.   No trauma  On arrival slow epistaxis.  Patient instructed on how to hold pressure on her nose.  Instructed not to remove pressure and to hold for 15 minutes.  No blood thinners.   Patient is aware her care has been started and the importance of staying for complete work up and evaluation.     Abigail Brooks 07/28/19 1701  5:44 PM Patient re-evaluated, she continues to bleed.  Had her blow her nose, given afrin again.  Again instructed to hold pressure for 15 minutes.    I personally observed patient holding pressure for 15 minutes due to her frequently removing pressure.  After she continued to bleed.   Will attempt to room.       Abigail Glass, PA-C 07/28/19 1823    Lajean Saver, MD 07/28/19 [phone]

## 2019-07-28 NOTE — ED Notes (Signed)
Bleeding resumed. Pt is holding pressure

## 2019-07-28 NOTE — Discharge Instructions (Signed)
Please hold your plavix tomorrow.  Call and follow up with ENT specialist in 3 days for removal of nasal tampon.  Return to the ER if your bleeding worsen.  Take over the counter tylenol as needed for pain.

## 2019-07-28 NOTE — ED Triage Notes (Signed)
Pt arrives from [facility] for nose bleed that started around 1 pm today. Bleeding stopped when EMS arrived. Pt is usually on blood thinners, but has been off of them for the past 4 days. Unsure of reason. EMS gave afrin. BP 160/78, HR 60, resp 16, 98% on room air, CBG 128

## 2019-07-28 NOTE — ED Notes (Signed)
Called ptar [phone]- Abigail Brooks

## 2019-07-28 NOTE — ED Provider Notes (Signed)
[facility] EMERGENCY DEPARTMENT Provider Note   CSN: [id number] Arrival date & time: 07/28/19  1650     History   Chief Complaint Chief Complaint  Patient presents with  . Epistaxis    HPI Abigail Brooks is a 84 y.o. female.     The history is provided by the patient and medical records. No language interpreter was used.  Epistaxis Associated symptoms: no fever      84 year old female with history of CAD status post stenting currently on Plavix brought here via EMS from nursing home facility for nosebleed.  Patient reports atraumatic nosebleed coming from both nares that started earlier today and which has been persistent.  No associated headache, nasal pain, facial pain, chest pain or shortness of breath.  No recent injury.  She denies ever having nosebleed in the past.  She denies any trauma.  She has been holding continuous pressure to the nose without success.  Past Medical History:  Diagnosis Date  . Abnormality of gait   . Anemia   . Anxiety state, unspecified   . Arthritis   . Blood transfusion    when had hysterectomy  . Blood transfusion without reported diagnosis   . Cataract   . Complications affecting other specified body systems, hypertension   . Congestive heart failure (HCC)    2012  . Coronary artery disease    stents x 2, sees Dr. Halina Andreas practice  . Depression   . Diabetes mellitus   . Dysuria   . GERD (gastroesophageal reflux disease)   . H/O hiatal hernia   . Hypertension   . Hypothyroidism    has had hx of treatment  . Myocardial infarction [facility])    "unsure of when"  . Other and unspecified hyperlipidemia   . Other B-complex deficiencies   . Other malaise and fatigue   . Shortness of breath   . Spinal stenosis    with bone spurs and ruptured disc  . Stroke (HCC)   . Unspecified vitamin D deficiency   . Urinary frequency   . Urinary tract infection    hx of    Patient Active Problem List   Diagnosis Date  Noted  . Abdominal pain 08/14/2017  . HLD (hyperlipidemia)   . Acute ischemic stroke (HCC) 11/14/2014  . Acute thrombotic stroke (HCC) 11/14/2014  . Acute renal failure superimposed on stage 3 chronic kidney disease (HCC) 11/14/2014  . Mild cognitive impairment 08/26/2014  . UTI (lower urinary tract infection) 08/21/2014  . Syncope and collapse 08/21/2014  . CKD (chronic kidney disease) stage 3, GFR 30-59 ml/min 08/10/2014  . Anemia, iron deficiency 08/10/2014  . 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 Stroke (HCC) 05/29/2014  . Type 2 diabetes mellitus with renal manifestations, controlled (HCC) 05/05/2014  . Arthritis, senescent 05/05/2014  . Falls frequently 04/22/2014  . TIA (transient ischemic attack) 02/23/2014  . CVA (cerebral vascular accident) (HCC) 02/21/2014  . Left-sided weakness 02/21/2014  . CVA (cerebral infarction) 02/21/2014  . Dementia with behavioral disturbance (HCC) 11/25/2013  . Overactive bladder 11/04/2013  . Essential hypertension, benign 10/21/2013  . Memory loss 10/21/2013  . Delusional disorder (HCC) 10/21/2013  . UTI (urinary tract infection) 10/21/2013  . Falls 08/05/2013  . Syncope 07/01/2013    Class: Acute  . Constipation 06/24/2013  . Routine general medical examination at a health care facility 06/24/2013  . Cartilage disorder 06/24/2013  . Other and unspecified hyperlipidemia 06/23/2013  . Hypertension associated with diabetes (HCC)  11/18/2012  . Type II or unspecified type diabetes mellitus without mention of complication, not stated as uncontrolled 11/18/2012  . CAD (coronary artery disease) 11/18/2012  . Depression 11/18/2012  . Anxiety state, unspecified 11/18/2012  . Chronic diastolic CHF (congestive heart failure) (HCC) 11/18/2012  . Lumbar stenosis with neurogenic claudication 12/21/2011    Past Surgical History:  Procedure Laterality Date  . ABDOMINAL HYSTERECTOMY  1986  . APPENDECTOMY    . BACK SURGERY     ruptured  disc, lumbar area  . BLADDER SUSPENSION    . CARDIAC CATHETERIZATION    . CATARACT EXTRACTION W/ INTRAOCULAR LENS  IMPLANT, BILATERAL    . coronary     stents x 2, stent implant placed9/21/06  . EYE SURGERY    . LUMBAR LAMINECTOMY/DECOMPRESSION MICRODISCECTOMY  12/21/2011   Procedure: LUMBAR LAMINECTOMY/DECOMPRESSION MICRODISCECTOMY 1 LEVEL;  Surgeon: Temple PaciniHenry A Pool, MD;  Location: [facility];  Service: Neurosurgery;  Laterality: Bilateral;  Lumbar four-five decompressive lumbar laminectomy; Right Lumbar four-five microdiscectomy  . SPINE SURGERY       OB History   No obstetric history on file.      Home Medications    Prior to Admission medications   Medication Sig Start Date End Date Taking? Authorizing Provider  atorvastatin (LIPITOR) 10 MG tablet TAKE 1 TABLET BY MOUTH EVERY DAY AT 6PM Patient taking differently: TAKE 10mg  BY MOUTH EVERY DAY AT 6PM 02/03/15   Sharon SellerEubanks, Jessica K, NP  buPROPion (WELLBUTRIN SR) 100 MG 12 hr tablet Take 1 tablet (100 mg total) by mouth daily. 08/26/14   Micki RileySethi, [name] S, MD  Calcium Carbonate-Vitamin D (CALCIUM + D PO) Take 1 tablet by mouth daily. Calcium 315 mg,Vitamin D3 250 I.U.    [provider]  carboxymethylcellulose (REFRESH TEARS) 0.5 % SOLN Place 1 drop into both eyes 4 (four) times daily.    [provider]  clonazePAM (KLONOPIN) 0.5 MG tablet Take 0.5 mg by mouth daily.  11/12/14   [provider]  clopidogrel (PLAVIX) 75 MG tablet TAKE 1 TABLET BY MOUTH EVERY DAY Patient taking differently: TAKE 75mg  BY MOUTH once daily 02/03/15   Sharon SellerEubanks, Jessica K, NP  fenofibrate 54 MG tablet Take 1 tablet (54 mg total) by mouth daily. 08/10/14   [name], Mahima, MD  ferrous sulfate 325 (65 FE) MG tablet TAKE 1 TABLET BY MOUTH EVERY MORNING WITH BREAKFAST Patient not taking: Reported on 04/24/2017 02/03/15   Sharon SellerEubanks, Jessica K, NP  fesoterodine (TOVIAZ) 4 MG TB24 tablet Take 4 mg by mouth every evening.     [provider]   furosemide (LASIX) 20 MG tablet Take 20 mg by mouth. Three times per week.    [provider]  hydrALAZINE (APRESOLINE) 25 MG tablet Take 25 mg by mouth 3 (three) times daily.    [provider]  hydrOXYzine (ATARAX/VISTARIL) 25 MG tablet Take 25 mg by mouth every 8 (eight) hours as needed for itching.    [provider]  insulin glargine (LANTUS) 100 UNIT/ML injection Inject 36 Units into the skin at bedtime.     [provider]  losartan-hydrochlorothiazide (HYZAAR) 50-12.5 MG per tablet Take 1 tablet by mouth daily. 09/25/14   [provider]  metFORMIN (GLUCOPHAGE) 500 MG tablet Take 1 tablet (500 mg total) by mouth daily with breakfast. 08/10/14   [name], Mahima, MD  metoprolol tartrate (LOPRESSOR) 25 MG tablet Take 25 mg by mouth 2 (two) times daily.    [provider]  morphine (MS  CONTIN) 15 MG 12 hr tablet Take 1 tablet (15 mg total) by mouth every morning. 08/17/17   Leatha Gilding, MD  Multiple Vitamins-Iron (MULTI-VITAMIN/IRON) TABS Take 1 tablet by mouth every morning.    [provider]  nitroGLYCERIN (NITROSTAT) 0.4 MG SL tablet Place 0.4 mg under the tongue every 5 (five) minutes as needed for chest pain. Reported on 10/12/2015    [provider]  NOVOLOG FLEXPEN 100 UNIT/ML FlexPen Inject 0-12 Units into the skin See admin instructions. SSI- 151-200=4 units 201-250=6 units 251-300=8 units 301-350=10 units 351-400=12 units 09/24/15   [provider]  oxyCODONE-acetaminophen (PERCOCET/ROXICET) 5-325 MG tablet Take 1 tablet by mouth every 8 (eight) hours as needed. 08/17/17   Leatha Gilding, MD  polyethylene glycol (MIRALAX / GLYCOLAX) packet Take 17 g by mouth daily. 08/17/17   Leatha Gilding, MD  senna (SENOKOT) 8.6 MG TABS tablet Take 1 tablet by mouth every morning.    [provider]  trimethoprim (TRIMPEX) 100 MG tablet Take 100 mg by mouth daily.  09/24/16   [provider]   UNABLE TO FIND Take 30 mLs by mouth daily. UTI-Stat 8,469    [provider]    Family History Family History  Problem Relation Age of Onset  . Anesthesia problems Sister   . Stroke Sister   . Anesthesia problems Son   . Thyroid cancer Son   . Heart disease Mother   . Stroke Mother   . Diabetes Sister     Social History Social History   Tobacco Use  . Smoking status: Former Smoker    Types: Cigarettes  . Smokeless tobacco: Current User    Types: Snuff  . Tobacco comment: 40 years ago  Substance Use Topics  . Alcohol use: No    Alcohol/week: 0.0 standard drinks  . Drug use: No     Allergies   Morphine and related and Enalapril   Review of Systems Review of Systems  Constitutional: Negative for fever.  HENT: Positive for nosebleeds.   All other systems reviewed and are negative.    Physical Exam Updated Vital Signs BP (!) 148/132 (BP Location: Right Arm)   Pulse (!) 58   Temp 98.4 F (36.9 C) (Oral)   Resp 16   SpO2 96%   Physical Exam Vitals signs and nursing note reviewed.  Constitutional:      General: She is not in acute distress.    Appearance: She is well-developed.  HENT:     Head: Atraumatic.     Nose:     Comments: Dried blood noted both nares without any obvious source of active bleeding at this time.  No signs of injury.    Mouth/Throat:     Mouth: Mucous membranes are moist.     Pharynx: Oropharynx is clear.  Eyes:     Conjunctiva/sclera: Conjunctivae normal.  Neck:     Musculoskeletal: Neck supple.  Skin:    Findings: No rash.  Neurological:     Mental Status: She is alert. Mental status is at baseline.  Psychiatric:        Mood and Affect: Mood normal.      ED Treatments / Results  Labs (all labs ordered are listed, but only abnormal results are displayed) Labs Reviewed - No data to display  EKG None  Radiology No results found.  Procedures .Epistaxis Management  Date/Time: 07/28/2019 7:37 PM Performed  by: Fayrene Helper, PA-C Authorized by: Fayrene Helper, PA-C   Consent:  Consent obtained:  Verbal   Consent given by:  Patient   Risks discussed:  Bleeding and pain   Alternatives discussed:  No treatment and referral Anesthesia (see MAR for exact dosages):    Anesthesia method:  None Procedure details:    Treatment site:  Unable to specify   Treatment method:  Nasal tampon (rhino rocket 7.5cm)   Treatment complexity:  Limited   Treatment episode: initial   Post-procedure details:    Assessment:  Bleeding stopped   Patient tolerance of procedure:  Tolerated well, no immediate complications   (including critical care time)  Medications Ordered in ED Medications - No data to display   Initial Impression / Assessment and Plan / ED Course  I have reviewed the triage vital signs and the nursing notes.  Pertinent labs & imaging results that were available during my care of the patient were reviewed by me and considered in my medical decision making (see chart for details).        BP (!) 148/132 (BP Location: Right Arm)   Pulse (!) 58   Temp 98.4 F (36.9 C) (Oral)   Resp 16   SpO2 96%    Final Clinical Impressions(s) / ED Diagnoses   Final diagnoses:  Right-sided epistaxis    ED Discharge Orders    None     7:21 PM Patient here with nosebleed likely from the right nares.  Has been ongoing throughout the day not improved despite holding pressure, as well as using Afrin spray.  I suspect this is likely an anterior bleed but I am unable to see an obvious source.  We will applied Rhino Rocket for pain control.  No other concerning feature.  Care discussed with Dr. Vanita Panda.   7:36 PM Bleeding from R nare.  A 7.5cm rhino rocket was placed with good result.  Will continue to monitor.    8:36 PM No active bleeding after being monitored for more than 1 hr.  Pt stable for discharge.  Recommend hold plavix tomorrow and follow up with ENT in 3 days for reassessment and removal  of rhino rocket.    Domenic Moras, PA-C 07/28/19 2037    Carmin Muskrat, MD 07/29/19 [phone]

## 2019-08-01 ENCOUNTER — Emergency Department (HOSPITAL_COMMUNITY)
Admission: EM | Admit: 2019-08-01 | Discharge: 2019-08-01 | Disposition: A | Discharge status: Home / Self Care | Payer: Medicare Other | Attending: Emergency Medicine | Admitting: Emergency Medicine

## 2019-08-01 ENCOUNTER — Other Ambulatory Visit: Payer: Self-pay

## 2019-08-01 ENCOUNTER — Emergency Department (HOSPITAL_COMMUNITY): Payer: Medicare Other

## 2019-08-01 DIAGNOSIS — Z20828 Contact with and (suspected) exposure to other viral communicable diseases: Secondary | ICD-10-CM | POA: Diagnosis not present

## 2019-08-01 DIAGNOSIS — I130 Hypertensive heart and chronic kidney disease with heart failure and stage 1 through stage 4 chronic kidney disease, or unspecified chronic kidney disease: Secondary | ICD-10-CM | POA: Insufficient documentation

## 2019-08-01 DIAGNOSIS — Z79899 Other long term (current) drug therapy: Secondary | ICD-10-CM | POA: Diagnosis not present

## 2019-08-01 DIAGNOSIS — I5032 Chronic diastolic (congestive) heart failure: Secondary | ICD-10-CM | POA: Diagnosis not present

## 2019-08-01 DIAGNOSIS — Z8673 Personal history of transient ischemic attack (TIA), and cerebral infarction without residual deficits: Secondary | ICD-10-CM | POA: Insufficient documentation

## 2019-08-01 DIAGNOSIS — F17220 Nicotine dependence, chewing tobacco, uncomplicated: Secondary | ICD-10-CM | POA: Insufficient documentation

## 2019-08-01 DIAGNOSIS — E039 Hypothyroidism, unspecified: Secondary | ICD-10-CM | POA: Diagnosis not present

## 2019-08-01 DIAGNOSIS — Z794 Long term (current) use of insulin: Secondary | ICD-10-CM | POA: Insufficient documentation

## 2019-08-01 DIAGNOSIS — J069 Acute upper respiratory infection, unspecified: Secondary | ICD-10-CM | POA: Diagnosis not present

## 2019-08-01 DIAGNOSIS — I252 Old myocardial infarction: Secondary | ICD-10-CM | POA: Diagnosis not present

## 2019-08-01 DIAGNOSIS — Z7902 Long term (current) use of antithrombotics/antiplatelets: Secondary | ICD-10-CM | POA: Diagnosis not present

## 2019-08-01 DIAGNOSIS — E1122 Type 2 diabetes mellitus with diabetic chronic kidney disease: Secondary | ICD-10-CM | POA: Diagnosis not present

## 2019-08-01 DIAGNOSIS — I2510 Atherosclerotic heart disease of native coronary artery without angina pectoris: Secondary | ICD-10-CM | POA: Insufficient documentation

## 2019-08-01 DIAGNOSIS — N183 Chronic kidney disease, stage 3 unspecified: Secondary | ICD-10-CM | POA: Diagnosis not present

## 2019-08-01 DIAGNOSIS — R0981 Nasal congestion: Secondary | ICD-10-CM | POA: Diagnosis present

## 2019-08-01 LAB — RESPIRATORY PANEL BY RT PCR (FLU A&B, COVID)
Influenza A by PCR: NEGATIVE
Influenza B by PCR: NEGATIVE
SARS Coronavirus 2 by RT PCR: NEGATIVE

## 2019-08-01 MED ORDER — AMOXICILLIN-POT CLAVULANATE 875-125 MG PO TABS: 1.0000 | ORAL_TABLET | Freq: Two times a day (BID) | ORAL | 0 refills | Status: AC

## 2019-08-01 NOTE — Discharge Instructions (Addendum)
Your covid test was negative. Please take the medications as prescribed.  Contact a health care provider if: Your symptoms last for 10 days or longer. Your symptoms get worse over time. You have a fever. You have severe sinus pain in your face or forehead. The glands in your jaw or neck become very swollen. Get help right away if you: Feel pain or pressure in your chest. Have shortness of breath. Faint or feel like you will faint. Have severe and persistent vomiting. Feel confused or disoriented.

## 2019-08-01 NOTE — ED Provider Notes (Signed)
[facility]-EMERGENCY DEPT Provider Note   CSN: [id number] Arrival date & time: 08/01/19  1651     History Chief Complaint  Patient presents with  . Nasal Congestion    [name] is a 84 y.o. female who presents emergency department with chief complaint of nasal congestion.  Patient recently had nasal packing on 07/28/2019 for epistaxis which is still in place.  She states that over the past few days she has had runny nose, cough.  She denies fevers, shortness of breath, body aches, loss of sense of taste or smell.  HPI     Past Medical History:  Diagnosis Date  . Abnormality of gait   . Anemia   . Anxiety state, unspecified   . Arthritis   . Blood transfusion    when had hysterectomy  . Blood transfusion without reported diagnosis   . Cataract   . Complications affecting other specified body systems, hypertension   . Congestive heart failure (HCC)    2012  . Coronary artery disease    stents x 2, sees Dr. Halina AndreasSmith, [facility]  . Depression   . Diabetes mellitus   . Dysuria   . GERD (gastroesophageal reflux disease)   . H/O hiatal hernia   . Hypertension   . Hypothyroidism    has had hx of treatment  . Myocardial infarction [facility](HCC)    "unsure of when"  . Other and unspecified hyperlipidemia   . Other B-complex deficiencies   . Other malaise and fatigue   . Shortness of breath   . Spinal stenosis    with bone spurs and ruptured disc  . Stroke (HCC)   . Unspecified vitamin D deficiency   . Urinary frequency   . Urinary tract infection    hx of    Patient Active Problem List   Diagnosis Date Noted  . Abdominal pain 08/14/2017  . HLD (hyperlipidemia)   . Acute ischemic stroke (HCC) 11/14/2014  . Acute thrombotic stroke (HCC) 11/14/2014  . Acute renal failure superimposed on stage 3 chronic kidney disease (HCC) 11/14/2014  . Mild cognitive impairment 08/26/2014  . UTI (lower urinary tract infection) 08/21/2014  . Syncope and  collapse 08/21/2014  . CKD (chronic kidney disease) stage 3, GFR 30-59 ml/min 08/10/2014  . Anemia, iron deficiency 08/10/2014  . 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 Stroke (HCC) 05/29/2014  . Type 2 diabetes mellitus with renal manifestations, controlled (HCC) 05/05/2014  . Arthritis, senescent 05/05/2014  . Falls frequently 04/22/2014  . TIA (transient ischemic attack) 02/23/2014  . CVA (cerebral vascular accident) (HCC) 02/21/2014  . Left-sided weakness 02/21/2014  . CVA (cerebral infarction) 02/21/2014  . Dementia with behavioral disturbance (HCC) 11/25/2013  . Overactive bladder 11/04/2013  . Essential hypertension, benign 10/21/2013  . Memory loss 10/21/2013  . Delusional disorder (HCC) 10/21/2013  . UTI (urinary tract infection) 10/21/2013  . Falls 08/05/2013  . Syncope 07/01/2013    Class: Acute  . Constipation 06/24/2013  . Routine general medical examination at a health care facility 06/24/2013  . Cartilage disorder 06/24/2013  . Other and unspecified hyperlipidemia 06/23/2013  . Hypertension associated with diabetes (HCC) 11/18/2012  . Type II or unspecified type diabetes mellitus without mention of complication, not stated as uncontrolled 11/18/2012  . CAD (coronary artery disease) 11/18/2012  . Depression 11/18/2012  . Anxiety state, unspecified 11/18/2012  . Chronic diastolic CHF (congestive heart failure) (HCC) 11/18/2012  . Lumbar stenosis with neurogenic claudication 12/21/2011    Past  Surgical History:  Procedure Laterality Date  . ABDOMINAL HYSTERECTOMY  1986  . APPENDECTOMY    . BACK SURGERY     ruptured disc, lumbar area  . BLADDER SUSPENSION    . CARDIAC CATHETERIZATION    . CATARACT EXTRACTION W/ INTRAOCULAR LENS  IMPLANT, BILATERAL    . coronary     stents x 2, stent implant placed9/21/06  . EYE SURGERY    . LUMBAR LAMINECTOMY/DECOMPRESSION MICRODISCECTOMY  12/21/2011   Procedure: LUMBAR LAMINECTOMY/DECOMPRESSION MICRODISCECTOMY 1  LEVEL;  Surgeon: Temple Pacini, MD;  Location: [facility];  Service: Neurosurgery;  Laterality: Bilateral;  Lumbar four-five decompressive lumbar laminectomy; Right Lumbar four-five microdiscectomy  . SPINE SURGERY       OB History   No obstetric history on file.     Family History  Problem Relation Age of Onset  . Anesthesia problems Sister   . Stroke Sister   . Anesthesia problems Son   . Thyroid cancer Son   . Heart disease Mother   . Stroke Mother   . Diabetes Sister     Social History   Tobacco Use  . Smoking status: Former Smoker    Types: Cigarettes  . Smokeless tobacco: Current User    Types: Snuff  . Tobacco comment: 40 years ago  Substance Use Topics  . Alcohol use: No    Alcohol/week: 0.0 standard drinks  . Drug use: No    Home Medications Prior to Admission medications   Medication Sig Start Date End Date Taking? Authorizing Provider  atorvastatin (LIPITOR) 10 MG tablet TAKE 1 TABLET BY MOUTH EVERY DAY AT 6PM Patient taking differently: TAKE  BY MOUTH EVERY DAY AT 6PM 02/03/15   Sharon Seller, NP  buPROPion [facility] SR) 100 MG 12 hr tablet Take 1 tablet (100 mg total) by mouth daily. 08/26/14   Micki Riley, MD  Calcium Carbonate-Vitamin D (CALCIUM + D PO) Take 1 tablet by mouth daily. Calcium 315 mg,Vitamin D3 250 I.U.    [provider]  carboxymethylcellulose (REFRESH TEARS) 0.5 % SOLN Place 1 drop into both eyes 4 (four) times daily.    [provider]  clonazePAM (KLONOPIN) 0.5 MG tablet Take 0.5 mg by mouth daily.  11/12/14   [provider]  clopidogrel (PLAVIX) 75 MG tablet TAKE 1 TABLET BY MOUTH EVERY DAY Patient taking differently: TAKE  BY MOUTH once daily 02/03/15   Sharon Seller, NP  fenofibrate 54 MG tablet Take 1 tablet (54 mg total) by mouth daily. 08/10/14   Oneal Grout, MD  ferrous sulfate 325 (65 FE) MG tablet TAKE 1 TABLET BY MOUTH EVERY MORNING WITH BREAKFAST Patient not taking: Reported  on 04/24/2017 02/03/15   Sharon Seller, NP  fesoterodine (TOVIAZ) 4 MG TB24 tablet Take 4 mg by mouth every evening.     [provider]  furosemide (LASIX) 20 MG tablet Take 20 mg by mouth. Three times per week.    [provider]  hydrALAZINE (APRESOLINE) 25 MG tablet Take 25 mg by mouth 3 (three) times daily.    [provider]  hydrOXYzine (ATARAX/VISTARIL) 25 MG tablet Take 25 mg by mouth every 8 (eight) hours as needed for itching.    [provider]  insulin glargine (LANTUS) 100 UNIT/ML injection Inject 36 Units into the skin at bedtime.     [provider]  losartan-hydrochlorothiazide (HYZAAR) 50-12.5 MG per tablet Take 1 tablet by mouth daily. 09/25/14   [provider]  metFORMIN (GLUCOPHAGE) 500 MG tablet Take 1 tablet (500 mg total) by mouth daily with breakfast. 08/10/14   Oneal Grout, MD  metoprolol tartrate (LOPRESSOR) 25 MG tablet Take 25 mg by mouth 2 (two) times daily.    [provider]  morphine (MS CONTIN) 15 MG 12 hr tablet Take 1 tablet (15 mg total) by mouth every morning. 08/17/17   Leatha Gilding, MD  Multiple Vitamins-Iron (MULTI-VITAMIN/IRON) TABS Take 1 tablet by mouth every morning.    [provider]  nitroGLYCERIN (NITROSTAT) 0.4 MG SL tablet Place 0.4 mg under the tongue every 5 (five) minutes as needed for chest pain. Reported on 10/12/2015    [provider]  NOVOLOG FLEXPEN 100 UNIT/ML FlexPen Inject 0-12 Units into the skin See admin instructions. SSI- 151-200=4 units 201-250=6 units 251-300=8 units 301-350=10 units 351-400=12 units 09/24/15   [provider]  oxyCODONE-acetaminophen (PERCOCET/ROXICET) 5-325 MG tablet Take 1 tablet by mouth every 8 (eight) hours as needed. 08/17/17   Leatha Gilding, MD  polyethylene glycol (MIRALAX / GLYCOLAX) packet Take 17 g by mouth daily. 08/17/17   Leatha Gilding, MD  senna (SENOKOT) 8.6 MG TABS tablet Take 1 tablet by mouth  every morning.    [provider]  trimethoprim (TRIMPEX) 100 MG tablet Take 100 mg by mouth daily.  09/24/16   [provider]  UNABLE TO FIND Take 30 mLs by mouth daily. UTI-Stat 6,045    [provider]    Allergies    Morphine and related and Enalapril  Review of Systems   Review of Systems Ten systems reviewed and are negative for acute change, except as noted in the HPI. \ Physical Exam Updated Vital Signs BP (!) 148/54 (BP Location: Left Arm)   Pulse (!) 58   Temp 98.6 F (37 C) (Oral)   Resp 18   Ht  (1.549 m)   Wt 90.7 kg   SpO2 95%   BMI 37.79 kg/m   Physical Exam Vitals and nursing note reviewed.  Constitutional:      General: She is not in acute distress.    Appearance: She is well-developed. She is not diaphoretic.  HENT:     Head: Normocephalic and atraumatic.  Eyes:     General: No scleral icterus.    Conjunctiva/sclera: Conjunctivae normal.  Cardiovascular:     Rate and Rhythm: Normal rate and regular rhythm.     Heart sounds: Normal heart sounds. No murmur. No friction rub. No gallop.   Pulmonary:     Effort: Pulmonary effort is normal. No respiratory distress.     Breath sounds: Normal breath sounds.  Abdominal:     General: Bowel sounds are normal. There is no distension.     Palpations: Abdomen is soft. There is no mass.     Tenderness: There is no abdominal tenderness. There is no guarding.  Musculoskeletal:     Cervical back: Normal range of motion.  Skin:    General: Skin is warm and dry.  Neurological:     Mental Status: She is alert and oriented to person, place, and time.  Psychiatric:        Behavior: Behavior normal.     ED Results / Procedures / Treatments   Labs (all labs ordered are listed, but only abnormal results are displayed) Labs Reviewed  RESPIRATORY PANEL BY RT PCR (FLU A&B, COVID)    EKG None  Radiology DG Chest Port 1 View  Result Date: 08/01/2019 CLINICAL  DATA:  Per EMS,  patient comes from [place]. Last week patient was seen for epistaxis and still has tamponade in right nare. Patient is now c/o nasal congestion. EMS states patient told EMS she had a productive cough. Patient states she is not coughing anything up, but has some "yellowish stuff" come up. Patient states she does have sore throat also. PT HX: ex smoker, DM, HTN EXAM: PORTABLE CHEST 1 VIEW COMPARISON:  08/21/2014. FINDINGS: Lung volumes are low leading to crowding of the bronchovascular structures. Allowing for this, there is subtle hazy airspace opacity noted at both lung bases. Mid and upper lungs are clear. No pleural effusion.  No pneumothorax. Cardiac silhouette is mildly enlarged. No mediastinal or hilar masses. Skeletal structures grossly intact IMPRESSION: 1. Subtle airspace opacities in the lung bases. Findings may reflect multifocal infection or be due to atelectasis accentuated by the low lung volumes. No other evidence of an acute abnormality. Electronically Signed   By: Lajean Manes M.D.   On: 08/01/2019 19:07    Procedures Procedures (including critical care time)  Medications Ordered in ED Medications - No data to display  ED Course  I have reviewed the triage vital signs and the nursing notes.  Pertinent labs & imaging results that were available during my care of the patient were reviewed by me and considered in my medical decision making (see chart for details).  Clinical Course as of Jul 31 1940  Sat Aug 01, 2019  1911 DG Chest [place] 1 View [AH]    Clinical Course User Index [AH] Margarita Mail, PA-C   MDM Rules/Calculators/A&P     CHA2DS2/VAS Stroke Risk Points      N/A >= 2 Points: High Risk  1 - 1.99 Points: Medium Risk  0 Points: Low Risk    A final score could not be computed because of missing components.: Last  Change: N/A     This score determines the patient's risk of having a stroke if the  patient has atrial fibrillation.      This score is not  applicable to this patient. Components are not  calculated.                   Final Clinical Impression(s) / ED Diagnoses Final diagnoses:  Upper respiratory tract infection, unspecified type   84 year old female here with URI symptoms.  I personally reviewed the patient's labs that show a negative test for coronavirus and flu.  The patient's portable chest x-ray showed bilateral infiltrates versus atelectasis.  Given her URI symptoms we will treat with  Augmentin.  Patient did have recent epistaxis episode may have had some aspiration.  Patient is otherwise without fever, shortness of breath.  Patient appears appropriate for discharge back to her care facility.  Abigail Brooks was evaluated in Emergency Department on 08/01/2019 for the symptoms described in the history of present illness. She was evaluated in the context of the global COVID-19 pandemic, which necessitated consideration that the patient might be at risk for infection with the SARS-CoV-2 virus that causes COVID-19. Institutional protocols and algorithms that pertain to the evaluation of patients at risk for COVID-19 are in a state of rapid change based on information released by regulatory bodies including the CDC and federal and state organizations. These policies and algorithms were followed during the patient's care in the ED.  Rx / DC Orders ED Discharge Orders    None       Margarita Mail, PA-C 08/01/19 2200  Loren Racer, MD 08/02/19 Norberta Keens

## 2019-08-01 NOTE — ED Notes (Signed)
[facility] called for transportation to facility.

## 2019-08-01 NOTE — ED Triage Notes (Addendum)
Per EMS, patient comes from [place]. Last week patient was seen for epistaxis and still has tamponade in right nare. Patient is now c/o nasal congestion. EMS states patient told EMS she had a productive cough. Patient states she is not coughing anything up, but has some "yellowish stuff" come up. Patient states she does have sore throat also. Patient states she had surgery on her face last week but doesn't remember the name of the surgery.

## 2019-08-01 NOTE — ED Notes (Signed)
PTs son Joscelin Fray, updated on pt discharge per request.

## 2019-08-01 NOTE — ED Notes (Signed)
Attempted to call Abigail Brooks to give dc report and got an answering machine. Message left to call us back for report.

## 2019-09-21 DEATH — deceased
